# Patient Record
Sex: Male | Born: 1940 | Race: White | Hispanic: No | Smoking: Current every day smoker
Health system: Southern US, Community
[De-identification: ages and names within clinical notes are randomized; demographics above are authoritative.]

## PROBLEM LIST (undated history)

## (undated) DIAGNOSIS — F32A Depression, unspecified: Secondary | ICD-10-CM

## (undated) DIAGNOSIS — F4310 Post-traumatic stress disorder, unspecified: Secondary | ICD-10-CM

## (undated) DIAGNOSIS — F419 Anxiety disorder, unspecified: Secondary | ICD-10-CM

## (undated) DIAGNOSIS — F1020 Alcohol dependence, uncomplicated: Secondary | ICD-10-CM

## (undated) DIAGNOSIS — J9611 Chronic respiratory failure with hypoxia: Secondary | ICD-10-CM

## (undated) DIAGNOSIS — I219 Acute myocardial infarction, unspecified: Secondary | ICD-10-CM

## (undated) DIAGNOSIS — G8929 Other chronic pain: Secondary | ICD-10-CM

## (undated) DIAGNOSIS — J189 Pneumonia, unspecified organism: Secondary | ICD-10-CM

## (undated) DIAGNOSIS — M1990 Unspecified osteoarthritis, unspecified site: Secondary | ICD-10-CM

## (undated) DIAGNOSIS — I480 Paroxysmal atrial fibrillation: Secondary | ICD-10-CM

## (undated) DIAGNOSIS — G629 Polyneuropathy, unspecified: Secondary | ICD-10-CM

## (undated) DIAGNOSIS — K224 Dyskinesia of esophagus: Secondary | ICD-10-CM

## (undated) DIAGNOSIS — F329 Major depressive disorder, single episode, unspecified: Secondary | ICD-10-CM

## (undated) DIAGNOSIS — D649 Anemia, unspecified: Secondary | ICD-10-CM

## (undated) DIAGNOSIS — J449 Chronic obstructive pulmonary disease, unspecified: Secondary | ICD-10-CM

## (undated) DIAGNOSIS — E119 Type 2 diabetes mellitus without complications: Secondary | ICD-10-CM

## (undated) DIAGNOSIS — I10 Essential (primary) hypertension: Secondary | ICD-10-CM

## (undated) DIAGNOSIS — R600 Localized edema: Secondary | ICD-10-CM

## (undated) DIAGNOSIS — M549 Dorsalgia, unspecified: Secondary | ICD-10-CM

## (undated) DIAGNOSIS — K56600 Partial intestinal obstruction, unspecified as to cause: Secondary | ICD-10-CM

## (undated) HISTORY — DX: Other chronic pain: G89.29

## (undated) HISTORY — DX: Dorsalgia, unspecified: M54.9

## (undated) HISTORY — DX: Type 2 diabetes mellitus without complications: E11.9

## (undated) HISTORY — PX: OTHER SURGICAL HISTORY: SHX169

## (undated) HISTORY — DX: Acute myocardial infarction, unspecified: I21.9

## (undated) HISTORY — DX: Post-traumatic stress disorder, unspecified: F43.10

## (undated) HISTORY — DX: Chronic obstructive pulmonary disease, unspecified: J44.9

## (undated) HISTORY — PX: APPENDECTOMY: SHX54

## (undated) HISTORY — DX: Major depressive disorder, single episode, unspecified: F32.9

## (undated) HISTORY — DX: Polyneuropathy, unspecified: G62.9

## (undated) HISTORY — DX: Anemia, unspecified: D64.9

## (undated) HISTORY — PX: PROSTATE SURGERY: SHX751

## (undated) HISTORY — DX: Depression, unspecified: F32.A

## (undated) HISTORY — PX: HERNIA REPAIR: SHX51

## (undated) HISTORY — DX: Unspecified osteoarthritis, unspecified site: M19.90

---

## 2002-07-30 ENCOUNTER — Encounter: Payer: Self-pay | Admitting: *Deleted

## 2002-07-30 ENCOUNTER — Emergency Department (HOSPITAL_COMMUNITY): Admission: EM | Admit: 2002-07-30 | Discharge: 2002-07-30 | Payer: Self-pay | Admitting: *Deleted

## 2003-04-12 ENCOUNTER — Emergency Department (HOSPITAL_COMMUNITY): Admission: EM | Admit: 2003-04-12 | Discharge: 2003-04-12 | Payer: Self-pay | Admitting: Emergency Medicine

## 2003-08-27 ENCOUNTER — Inpatient Hospital Stay (HOSPITAL_COMMUNITY): Admission: AC | Admit: 2003-08-27 | Discharge: 2003-08-31 | Payer: Self-pay

## 2004-04-23 ENCOUNTER — Encounter: Admission: RE | Admit: 2004-04-23 | Discharge: 2004-04-23 | Payer: Self-pay | Admitting: *Deleted

## 2005-06-24 ENCOUNTER — Emergency Department (HOSPITAL_COMMUNITY): Admission: EM | Admit: 2005-06-24 | Discharge: 2005-06-24 | Payer: Self-pay | Admitting: Emergency Medicine

## 2005-10-13 ENCOUNTER — Inpatient Hospital Stay (HOSPITAL_COMMUNITY): Admission: EM | Admit: 2005-10-13 | Discharge: 2005-10-15 | Payer: Self-pay | Admitting: Emergency Medicine

## 2005-10-15 ENCOUNTER — Inpatient Hospital Stay (HOSPITAL_COMMUNITY): Admission: EM | Admit: 2005-10-15 | Discharge: 2005-10-17 | Payer: Self-pay | Admitting: Psychiatry

## 2005-10-16 ENCOUNTER — Ambulatory Visit: Payer: Self-pay | Admitting: Psychiatry

## 2005-11-24 ENCOUNTER — Inpatient Hospital Stay (HOSPITAL_COMMUNITY): Admission: RE | Admit: 2005-11-24 | Discharge: 2005-11-27 | Payer: Self-pay | Admitting: Psychiatry

## 2005-11-24 ENCOUNTER — Ambulatory Visit: Payer: Self-pay | Admitting: Psychiatry

## 2005-11-26 ENCOUNTER — Encounter (INDEPENDENT_AMBULATORY_CARE_PROVIDER_SITE_OTHER): Payer: Self-pay | Admitting: *Deleted

## 2006-04-08 ENCOUNTER — Emergency Department (HOSPITAL_COMMUNITY): Admission: EM | Admit: 2006-04-08 | Discharge: 2006-04-08 | Payer: Self-pay | Admitting: Diagnostic Radiology

## 2006-09-21 ENCOUNTER — Inpatient Hospital Stay (HOSPITAL_COMMUNITY): Admission: AD | Admit: 2006-09-21 | Discharge: 2006-09-30 | Payer: Self-pay | Admitting: *Deleted

## 2006-09-21 ENCOUNTER — Ambulatory Visit: Payer: Self-pay | Admitting: *Deleted

## 2007-03-05 ENCOUNTER — Ambulatory Visit: Payer: Self-pay | Admitting: Cardiology

## 2007-06-08 ENCOUNTER — Inpatient Hospital Stay (HOSPITAL_COMMUNITY): Admission: EM | Admit: 2007-06-08 | Discharge: 2007-06-09 | Payer: Self-pay | Admitting: Emergency Medicine

## 2007-06-09 ENCOUNTER — Encounter (INDEPENDENT_AMBULATORY_CARE_PROVIDER_SITE_OTHER): Payer: Self-pay | Admitting: Internal Medicine

## 2007-06-11 ENCOUNTER — Emergency Department: Payer: Self-pay | Admitting: Internal Medicine

## 2007-06-12 ENCOUNTER — Emergency Department (HOSPITAL_COMMUNITY): Admission: EM | Admit: 2007-06-12 | Discharge: 2007-06-12 | Payer: Self-pay | Admitting: Emergency Medicine

## 2007-11-15 ENCOUNTER — Emergency Department (HOSPITAL_COMMUNITY): Admission: EM | Admit: 2007-11-15 | Discharge: 2007-11-16 | Payer: Self-pay | Admitting: Emergency Medicine

## 2007-11-26 ENCOUNTER — Inpatient Hospital Stay (HOSPITAL_COMMUNITY): Admission: AD | Admit: 2007-11-26 | Discharge: 2007-12-04 | Payer: Self-pay | Admitting: *Deleted

## 2007-11-26 ENCOUNTER — Ambulatory Visit: Payer: Self-pay | Admitting: *Deleted

## 2007-11-28 ENCOUNTER — Encounter: Payer: Self-pay | Admitting: *Deleted

## 2007-12-21 ENCOUNTER — Encounter: Payer: Self-pay | Admitting: Emergency Medicine

## 2007-12-21 ENCOUNTER — Inpatient Hospital Stay (HOSPITAL_COMMUNITY): Admission: AD | Admit: 2007-12-21 | Discharge: 2007-12-25 | Payer: Self-pay | Admitting: *Deleted

## 2007-12-22 ENCOUNTER — Ambulatory Visit: Payer: Self-pay | Admitting: *Deleted

## 2007-12-26 ENCOUNTER — Inpatient Hospital Stay (HOSPITAL_COMMUNITY): Admission: EM | Admit: 2007-12-26 | Discharge: 2008-01-02 | Payer: Self-pay | Admitting: Emergency Medicine

## 2007-12-28 ENCOUNTER — Encounter (INDEPENDENT_AMBULATORY_CARE_PROVIDER_SITE_OTHER): Payer: Self-pay | Admitting: Internal Medicine

## 2008-01-02 ENCOUNTER — Inpatient Hospital Stay (HOSPITAL_COMMUNITY): Admission: AD | Admit: 2008-01-02 | Discharge: 2008-01-05 | Payer: Self-pay | Admitting: *Deleted

## 2008-02-21 ENCOUNTER — Emergency Department (HOSPITAL_COMMUNITY): Admission: EM | Admit: 2008-02-21 | Discharge: 2008-02-21 | Payer: Self-pay | Admitting: Emergency Medicine

## 2008-04-25 ENCOUNTER — Inpatient Hospital Stay (HOSPITAL_COMMUNITY): Admission: EM | Admit: 2008-04-25 | Discharge: 2008-04-27 | Payer: Self-pay | Admitting: Emergency Medicine

## 2008-04-27 ENCOUNTER — Inpatient Hospital Stay (HOSPITAL_COMMUNITY): Admission: AD | Admit: 2008-04-27 | Discharge: 2008-05-05 | Payer: Self-pay | Admitting: Psychiatry

## 2008-04-27 ENCOUNTER — Ambulatory Visit: Payer: Self-pay | Admitting: Psychiatry

## 2008-06-05 ENCOUNTER — Emergency Department (HOSPITAL_COMMUNITY): Admission: EM | Admit: 2008-06-05 | Discharge: 2008-06-05 | Payer: Self-pay | Admitting: Emergency Medicine

## 2008-06-18 ENCOUNTER — Inpatient Hospital Stay (HOSPITAL_COMMUNITY): Admission: EM | Admit: 2008-06-18 | Discharge: 2008-06-26 | Payer: Self-pay | Admitting: Emergency Medicine

## 2008-08-03 ENCOUNTER — Emergency Department (HOSPITAL_COMMUNITY): Admission: EM | Admit: 2008-08-03 | Discharge: 2008-08-03 | Payer: Self-pay | Admitting: Emergency Medicine

## 2008-09-25 ENCOUNTER — Emergency Department (HOSPITAL_COMMUNITY): Admission: EM | Admit: 2008-09-25 | Discharge: 2008-09-25 | Payer: Self-pay | Admitting: Emergency Medicine

## 2008-10-01 ENCOUNTER — Other Ambulatory Visit: Payer: Self-pay | Admitting: Emergency Medicine

## 2008-10-01 ENCOUNTER — Ambulatory Visit: Payer: Self-pay | Admitting: Psychiatry

## 2008-10-01 ENCOUNTER — Inpatient Hospital Stay (HOSPITAL_COMMUNITY): Admission: EM | Admit: 2008-10-01 | Discharge: 2008-10-06 | Payer: Self-pay | Admitting: Psychiatry

## 2008-10-21 ENCOUNTER — Emergency Department (HOSPITAL_COMMUNITY): Admission: EM | Admit: 2008-10-21 | Discharge: 2008-10-21 | Payer: Self-pay | Admitting: Emergency Medicine

## 2009-01-16 ENCOUNTER — Other Ambulatory Visit: Payer: Self-pay | Admitting: Emergency Medicine

## 2009-01-17 ENCOUNTER — Ambulatory Visit: Payer: Self-pay | Admitting: *Deleted

## 2009-01-17 ENCOUNTER — Inpatient Hospital Stay (HOSPITAL_COMMUNITY): Admission: AD | Admit: 2009-01-17 | Discharge: 2009-01-23 | Payer: Self-pay | Admitting: *Deleted

## 2009-03-30 ENCOUNTER — Inpatient Hospital Stay (HOSPITAL_COMMUNITY): Admission: EM | Admit: 2009-03-30 | Discharge: 2009-04-05 | Payer: Self-pay | Admitting: Emergency Medicine

## 2009-04-23 ENCOUNTER — Emergency Department (HOSPITAL_COMMUNITY): Admission: EM | Admit: 2009-04-23 | Discharge: 2009-04-23 | Payer: Self-pay | Admitting: Emergency Medicine

## 2010-11-08 LAB — GLUCOSE, CAPILLARY
Glucose-Capillary: 121 mg/dL — ABNORMAL HIGH (ref 70–99)
Glucose-Capillary: 129 mg/dL — ABNORMAL HIGH (ref 70–99)
Glucose-Capillary: 187 mg/dL — ABNORMAL HIGH (ref 70–99)
Glucose-Capillary: 96 mg/dL (ref 70–99)

## 2010-11-09 LAB — BASIC METABOLIC PANEL
BUN: 19 mg/dL (ref 6–23)
CO2: 26 mEq/L (ref 19–32)
Calcium: 8.5 mg/dL (ref 8.4–10.5)
Calcium: 8.8 mg/dL (ref 8.4–10.5)
Chloride: 105 mEq/L (ref 96–112)
GFR calc Af Amer: >60 mL/min (ref >60)
GFR calc Af Amer: >60 mL/min (ref >60)
GFR calc non Af Amer: >60 mL/min (ref >60)
Glucose, Bld: 122 mg/dL — ABNORMAL HIGH (ref 70–99)
Potassium: 3.7 mEq/L (ref 3.5–5.1)
Potassium: 3.9 mEq/L (ref 3.5–5.1)
Sodium: 137 mEq/L (ref 135–145)

## 2010-11-09 LAB — LIPID PANEL
HDL: 50 mg/dL (ref >39)
LDL Cholesterol: 37 mg/dL (ref 0–99)
Triglycerides: 93 mg/dL (ref <150)

## 2010-11-09 LAB — URINE MICROSCOPIC-ADD ON

## 2010-11-09 LAB — HEMOGLOBIN A1C: Hgb A1c MFr Bld: 5.2 % (ref 4.6–6.1)

## 2010-11-09 LAB — DIFFERENTIAL
Basophils Absolute: 0 10*3/uL (ref 0.0–0.1)
Basophils Relative: 0 % (ref 0–1)
Basophils Relative: 0 % (ref 0–1)
Basophils Relative: 0 % (ref 0–1)
Eosinophils Absolute: 0 10*3/uL (ref 0.0–0.7)
Eosinophils Absolute: 0.1 10*3/uL (ref 0.0–0.7)
Eosinophils Relative: 0 % (ref 0–5)
Eosinophils Relative: 1 % (ref 0–5)
Lymphocytes Relative: 7 % — ABNORMAL LOW (ref 12–46)
Lymphocytes Relative: 8 % — ABNORMAL LOW (ref 12–46)
Lymphs Abs: 0.6 10*3/uL — ABNORMAL LOW (ref 0.7–4.0)
Monocytes Absolute: 0.2 10*3/uL (ref 0.1–1.0)
Monocytes Absolute: 0.7 10*3/uL (ref 0.1–1.0)
Monocytes Relative: 2 % — ABNORMAL LOW (ref 3–12)
Monocytes Relative: 4 % (ref 3–12)
Monocytes Relative: 7 % (ref 3–12)
Neutro Abs: 7.2 10*3/uL (ref 1.7–7.7)
Neutro Abs: 7.7 10*3/uL (ref 1.7–7.7)
Neutrophils Relative %: 86 % — ABNORMAL HIGH (ref 43–77)

## 2010-11-09 LAB — GLUCOSE, CAPILLARY
Glucose-Capillary: 133 mg/dL — ABNORMAL HIGH (ref 70–99)
Glucose-Capillary: 145 mg/dL — ABNORMAL HIGH (ref 70–99)
Glucose-Capillary: 155 mg/dL — ABNORMAL HIGH (ref 70–99)
Glucose-Capillary: 161 mg/dL — ABNORMAL HIGH (ref 70–99)
Glucose-Capillary: 189 mg/dL — ABNORMAL HIGH (ref 70–99)
Glucose-Capillary: 206 mg/dL — ABNORMAL HIGH (ref 70–99)
Glucose-Capillary: 339 mg/dL — ABNORMAL HIGH (ref 70–99)

## 2010-11-09 LAB — URINALYSIS, ROUTINE W REFLEX MICROSCOPIC
Bilirubin Urine: NEGATIVE
Nitrite: NEGATIVE
Specific Gravity, Urine: 1.02 (ref 1.005–1.030)
pH: 5 (ref 5.0–8.0)

## 2010-11-09 LAB — PHOSPHORUS: Phosphorus: 3.4 mg/dL (ref 2.3–4.6)

## 2010-11-09 LAB — CBC
HCT: 28.8 % — ABNORMAL LOW (ref 39.0–52.0)
HCT: 30.3 % — ABNORMAL LOW (ref 39.0–52.0)
HCT: 33.2 % — ABNORMAL LOW (ref 39.0–52.0)
Hemoglobin: 10.5 g/dL — ABNORMAL LOW (ref 13.0–17.0)
Hemoglobin: 11.5 g/dL — ABNORMAL LOW (ref 13.0–17.0)
MCHC: 34.8 g/dL (ref 30.0–36.0)
MCHC: 35 g/dL (ref 30.0–36.0)
MCV: 95.7 fL (ref 78.0–100.0)
Platelets: 187 10*3/uL (ref 150–400)
Platelets: 204 10*3/uL (ref 150–400)
RBC: 3.01 MIL/uL — ABNORMAL LOW (ref 4.22–5.81)
RBC: 3.15 MIL/uL — ABNORMAL LOW (ref 4.22–5.81)
RDW: 15.2 % (ref 11.5–15.5)
RDW: 15.7 % — ABNORMAL HIGH (ref 11.5–15.5)
WBC: 6.3 10*3/uL (ref 4.0–10.5)

## 2010-11-09 LAB — TSH: TSH: 2.06 u[IU]/mL (ref 0.350–4.500)

## 2010-11-09 LAB — COMPREHENSIVE METABOLIC PANEL
ALT: 18 U/L (ref 0–53)
AST: 22 U/L (ref 0–37)
Albumin: 3.7 g/dL (ref 3.5–5.2)
Alkaline Phosphatase: 68 U/L (ref 39–117)
Glucose, Bld: 92 mg/dL (ref 70–99)
Potassium: 3.5 mEq/L (ref 3.5–5.1)
Sodium: 131 mEq/L — ABNORMAL LOW (ref 135–145)
Total Protein: 6.5 g/dL (ref 6.0–8.3)

## 2010-11-11 LAB — RAPID URINE DRUG SCREEN, HOSP PERFORMED
Benzodiazepines: POSITIVE — AB
Cocaine: NOT DETECTED
Opiates: NOT DETECTED
Tetrahydrocannabinol: NOT DETECTED

## 2010-11-11 LAB — GLUCOSE, CAPILLARY
Glucose-Capillary: 105 mg/dL — ABNORMAL HIGH (ref 70–99)
Glucose-Capillary: 109 mg/dL — ABNORMAL HIGH (ref 70–99)
Glucose-Capillary: 117 mg/dL — ABNORMAL HIGH (ref 70–99)
Glucose-Capillary: 119 mg/dL — ABNORMAL HIGH (ref 70–99)
Glucose-Capillary: 120 mg/dL — ABNORMAL HIGH (ref 70–99)
Glucose-Capillary: 124 mg/dL — ABNORMAL HIGH (ref 70–99)
Glucose-Capillary: 87 mg/dL (ref 70–99)
Glucose-Capillary: 92 mg/dL (ref 70–99)

## 2010-11-11 LAB — BASIC METABOLIC PANEL
BUN: 7 mg/dL (ref 6–23)
CO2: 26 mEq/L (ref 19–32)
Calcium: 9 mg/dL (ref 8.4–10.5)
GFR calc non Af Amer: >60 mL/min (ref >60)
Glucose, Bld: 100 mg/dL — ABNORMAL HIGH (ref 70–99)
Potassium: 3.7 mEq/L (ref 3.5–5.1)
Sodium: 137 mEq/L (ref 135–145)

## 2010-11-11 LAB — CBC
HCT: 33.8 % — ABNORMAL LOW (ref 39.0–52.0)
Hemoglobin: 12 g/dL — ABNORMAL LOW (ref 13.0–17.0)
MCHC: 35.5 g/dL (ref 30.0–36.0)
Platelets: 212 10*3/uL (ref 150–400)
RDW: 13.5 % (ref 11.5–15.5)

## 2010-11-11 LAB — DIFFERENTIAL
Basophils Absolute: 0.1 10*3/uL (ref 0.0–0.1)
Basophils Relative: 1 % (ref 0–1)
Eosinophils Relative: 2 % (ref 0–5)
Monocytes Absolute: 0.6 10*3/uL (ref 0.1–1.0)

## 2010-11-11 LAB — ACETAMINOPHEN LEVEL: Acetaminophen (Tylenol), Serum: 14.9 ug/mL (ref 10–30)

## 2010-11-14 LAB — GLUCOSE, CAPILLARY
Glucose-Capillary: 87 mg/dL (ref 70–99)
Glucose-Capillary: 100 mg/dL — ABNORMAL HIGH (ref 70–99)
Glucose-Capillary: 103 mg/dL — ABNORMAL HIGH (ref 70–99)
Glucose-Capillary: 132 mg/dL — ABNORMAL HIGH (ref 70–99)
Glucose-Capillary: 138 mg/dL — ABNORMAL HIGH (ref 70–99)
Glucose-Capillary: 88 mg/dL (ref 70–99)
Glucose-Capillary: 89 mg/dL (ref 70–99)
Glucose-Capillary: 91 mg/dL (ref 70–99)
Glucose-Capillary: 95 mg/dL (ref 70–99)

## 2010-11-19 LAB — URINALYSIS, ROUTINE W REFLEX MICROSCOPIC
Glucose, UA: 100 mg/dL — AB
Ketones, ur: NEGATIVE mg/dL
Leukocytes, UA: NEGATIVE
Protein, ur: NEGATIVE mg/dL
pH: 6 (ref 5.0–8.0)

## 2010-11-19 LAB — COMPREHENSIVE METABOLIC PANEL
ALT: 13 U/L (ref 0–53)
Alkaline Phosphatase: 70 U/L (ref 39–117)
BUN: 16 mg/dL (ref 6–23)
CO2: 26 mEq/L (ref 19–32)
Calcium: 8.7 mg/dL (ref 8.4–10.5)
GFR calc non Af Amer: >60 mL/min (ref >60)
Glucose, Bld: 288 mg/dL — ABNORMAL HIGH (ref 70–99)
Potassium: 4.4 mEq/L (ref 3.5–5.1)
Sodium: 138 mEq/L (ref 135–145)

## 2010-11-19 LAB — CBC
HCT: 33 % — ABNORMAL LOW (ref 39.0–52.0)
HCT: 33.1 % — ABNORMAL LOW (ref 39.0–52.0)
Hemoglobin: 11.5 g/dL — ABNORMAL LOW (ref 13.0–17.0)
Hemoglobin: 11.6 g/dL — ABNORMAL LOW (ref 13.0–17.0)
MCHC: 34.7 g/dL (ref 30.0–36.0)
MCV: 94.4 fL (ref 78.0–100.0)
RBC: 3.5 MIL/uL — ABNORMAL LOW (ref 4.22–5.81)
RBC: 3.52 MIL/uL — ABNORMAL LOW (ref 4.22–5.81)
WBC: 11.3 10*3/uL — ABNORMAL HIGH (ref 4.0–10.5)

## 2010-11-19 LAB — BASIC METABOLIC PANEL
Chloride: 108 mEq/L (ref 96–112)
Creatinine, Ser: 0.84 mg/dL (ref 0.4–1.5)
GFR calc Af Amer: >60 mL/min (ref >60)
Potassium: 3.4 mEq/L — ABNORMAL LOW (ref 3.5–5.1)
Sodium: 142 mEq/L (ref 135–145)

## 2010-11-19 LAB — DIFFERENTIAL
Basophils Relative: 0 % (ref 0–1)
Eosinophils Absolute: 0 10*3/uL (ref 0.0–0.7)
Eosinophils Absolute: 0.2 10*3/uL (ref 0.0–0.7)
Lymphs Abs: 2.1 10*3/uL (ref 0.7–4.0)
Monocytes Relative: 6 % (ref 3–12)
Neutro Abs: 11.5 10*3/uL — ABNORMAL HIGH (ref 1.7–7.7)
Neutrophils Relative %: 74 % (ref 43–77)
Neutrophils Relative %: 94 % — ABNORMAL HIGH (ref 43–77)

## 2010-11-19 LAB — GLUCOSE, CAPILLARY
Glucose-Capillary: 176 mg/dL — ABNORMAL HIGH (ref 70–99)
Glucose-Capillary: 208 mg/dL — ABNORMAL HIGH (ref 70–99)

## 2010-11-19 LAB — ETHANOL: Alcohol, Ethyl (B): <5 mg/dL (ref 0–10)

## 2010-12-17 NOTE — Discharge Summary (Signed)
NAMEDELONTE, MUSICH NO.:  [id number]   MEDICAL RECORD NO.:  [id number]          PATIENT TYPE:  IPS   LOCATION:  0503                          FACILITY:  [facility]   PHYSICIAN:  Geoffery Lyons, M.D.      DATE OF BIRTH:  1940-11-13   DATE OF ADMISSION:  04/27/2008  DATE OF DISCHARGE:  05/05/2008                               DISCHARGE SUMMARY   CHIEF COMPLAINT/PRESENT ILLNESS:  This was one of multiple admissions to  [facility] for this 70 year old male, voluntarily  admitted.  He has a history of depression and also alcohol dependence.  He had been admitted to the medical unit on April 25, 2008, with  acute exacerbation of his COPD.  He stressed to the medical staff that  he was having some thoughts of suicide.  Endorsed ongoing depression.  He had been compliant with his medication, was feeling sad with low  energy, fearing for his safety.  Concerned that he might overdose if he  returned home.  He did admit to drinking alcohol up to the week before  the admission.   PAST PSYCHIATRIC HISTORY:  One of several admissions to [facility].  Persistent history of depression and substance  abuse.   ALCOHOL AND DRUG HISTORY:  Persistent use of alcohol.   MEDICAL HISTORY:  1. COPD.  2. Hypertension.  3. Degenerative disk disease.  4. Coronary artery disease.   MEDICATIONS:  1. Albuterol nebulizer twice a day as needed.  2. Combivent inhaler twice a day.  3. Prednisone taper 40 mg twice a day for three, then 20 twice a day      for three, then 10 twice a day for three, then 10 daily for 3 more      days.  4. Doxycycline 100 mg twice a day.  5. Seroquel 100 mg at night.  6. Paxil 20 mg twice a day.   Physical exam failed to show any acute findings.   LABORATORY WORKUP:  Results not in the chart.   MENTAL STATUS EXAM:  Reveals an alert cooperative male.  Mood depressed.  Affect depressed, not as spontaneous.  Though processes  are logical,  coherent and relevant.  Admits to understanding that there was no one  else to be blamed for his use other than himself.  Endorsed he was  committed to abstain and get better.  Thought processes were logical,  coherent and relevant.  There were no active homicidal ideation.  Some  underlying suicidal ruminations, no plan.  No delusions.  Cognition well-  preserved.   ADMITTING DIAGNOSES:  AXIS I:  Major depressive disorder, posttraumatic  stress disorder by history, alcohol dependence.  AXIS II:  No diagnosis.  AXIS III:  Chronic obstructive pulmonary disease, hypertension, urinary  retention.  AXIS IV:  Moderate.  AXIS V:  Global Assessment of Functioning upon admission 35-40, highest  Global Assessment of Functioning in the last year 60.   COURSE IN THE HOSPITAL:  He was admitted.  He was started in individual  and group psychotherapy.  He was maintained on  his prednisone taper.  He  was initially on Seroquel 100, what seemed to be too much, so he was  dropped to 15.  He was active in the group therapy.  He was able to open  up and continued to work on himself.  There was an issue in terms of  placement.  He was living by himself and this probably was not the  better disposition, but he was wanting to work through the [place] and get a  place where according to him he would be around his own kind.  On  May 02, 2008, he was able to look at the reality of his situation.  Insightful that the isolation was not helping him to feel better, but he  was very concrete.  He kept back to be you are looking at who needs to  be blamed.  I can't blame anyone but myself.  he kept repeating the  same thing, which showed no clear rational plan of how he was going to  keep himself from going back to drinking.  There was some  lightheadedness, but once we dropped the Seroquel to 50 got better, but  he did say that he suffered from lightheadedness before and it was not   medication-induced.  On May 05, 2008, he was in full contact with  reality.  He felt that he was ready to go.  There were no active  suicidal or homicidal ideas, no hallucinations or delusions.  No  evidence of withdrawal.  Physically wise, he was better.  Willing to  pursue outpatient treatment.  We went ahead and discharged to outpatient  follow-up.   DISCHARGE DIAGNOSES:  AXIS I:  Major depressive disorder, posttraumatic  stress disorder by history, alcohol abuse and alcohol dependence.  AXIS II:  No diagnosis.  AXIS III:  Chronic obstructive pulmonary disease, hypertension, urinary  retention.  AXIS IV:  Moderate.  AXIS V:  Global Assessment of Functioning upon discharge 50-55.   Discharged on Combivent inhaler 1 puff twice a day, Paxil 20 mg twice a  day, Protonix 40 mg per day, prednisone 20 mg one twice a day for two  more days, then prednisone 10 mg one twice a day for two more days,  Seroquel 50 one or two at bedtime, trazodone 50 at bedtime for sleep,  Vicodin 5/325 one every 6 hours as needed for pain as per his outpatient  pain clinic.  Continue oxygen at 3 liters per minute.  Follow-up at  [facility] and [facility].      Geoffery Lyons, M.D.  Electronically Signed     IL/MEDQ  D:  05/26/2008  T:  05/26/2008  Job:  [id number]

## 2010-12-17 NOTE — H&P (Signed)
[name], [name]:  [id number]   MEDICAL RECORD NO.:  [id number]          PATIENT TYPE:  IPS   LOCATION:  0500                          FACILITY:  [facility]   PHYSICIAN:  Geoffery Lyons, M.D.      DATE OF BIRTH:  03-09-1941   DATE OF ADMISSION:  10/01/2008  DATE OF DISCHARGE:                       PSYCHIATRIC ADMISSION ASSESSMENT   DATE OF ASSESSMENT:  October 02, 2008 at 0925.   IDENTIFYING INFORMATION:  A 70 year old male.  This is a voluntary  admission.   HISTORY OF PRESENT ILLNESS:  This is one of several [facility] admissions for  this 70 year old who initially presented in the emergency room with some  suicidal thoughts that he might want to blow himself up with C4  explosives.  He reports an increased depression since his mother is  dying in a local nursing home.  He says that she has been much worse.  He denies relapsing on alcohol and has had several months of abstinence.  He stopped his Paxil about 1 month ago due to sexual side effects.  He  has not kept any follow-up appointments at [facility] as  scheduled.  He has also not had any Seroquel, which he takes for PTSD,  in more than a month.  He denies any homicidal or suicidal thoughts  today.   PAST PSYCHIATRIC HISTORY:  Previously scheduled to follow up at [facility].  Has not kept any appointments.  Has also been  followed at the [facility] in the past.  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  This is a fifth [facility] admission within  the past year, and he was last here April 27, 2008 to May 05, 2008.  He has a history of binge drinking and reports several months of  abstinence.   SOCIAL HISTORY:  Single male, lives alone.  Says that he has friends to  support him in getting groceries and help with other activities of daily  living.  He feels that he would be better  off in the nursing home part  of the [employer] system.  Endorsing vague stressors of  living alone and grieving the illness of his mother who has now been in  a nursing home for 11 years.  No known legal problems.   FAMILY HISTORY:  He denies family history of mental illness or substance  abuse.   MEDICAL HISTORY:  Primary care Danissa Rundle has been the [facility] hospital or the  local emergency room.   MEDICAL PROBLEMS:  1. COPD.  2. Hypertension.  3. Degenerative disk disease.   PAST MEDICAL HISTORY:  1. Four different spine surgeries in the past.  2. History of myocardial infarction.  3. Most recently treated for acute exacerbation of COPD in the      emergency room on August 25, 2008 when he was started on a      prednisone taper and antibiotics.  4. Newly diagnosed with diabetes mellitus type 2.  5.  History of community-acquired pneumonia in November 2009.   CURRENT MEDICATIONS:  1. Ativan 2 mg x1 given in the emergency room.  2. Metformin 500 mg p.o. b.i.d. started September 25, 2008.  3. Albuterol inhalers as needed for asthma and COPD.  Has taken      Combivent inhaler in the past.   DRUG ALLERGIES:  None.   PHYSICAL EXAMINATION:  Physical exam was done in the emergency room as  noted in the record.   REVIEW OF SYSTEMS:  Done in the emergency room and is generally  unremarkable.   LABORATORY DATA:  He has a urine drug screen was positive for  benzodiazepines.  CBC - WBC 11.3, hemoglobin 11.6, hematocrit 33.1 and  platelets 190,000.  MCV is 94.4.  Chemistry - sodium 142, potassium 3.4,  chloride 108, carbon dioxide 26, BUN 10, creatinine 0.84 and random  glucose 104.   MENTAL STATUS EXAM:  Fully alert male, pleasant cooperative, bright  affect, animated.  Has been up participating in all groups today.  Has  quite a sense of humor.  Mood neutral.  Speech normal and giving a  coherent history.  Thought process logical, coherent.  No signs of  internal  distraction.  No delusional statements made.  No evidence of  psychosis.  In full contact with reality.  No suicidal or homicidal  thoughts voiced.  Cognition fully preserved.   IMPRESSION:  AXIS I:.  Depressive disorder NOS.  Posttraumatic stress  disorder by history.  Alcohol abuse in partial remission.  AXIS II:  Deferred.  AXIS III:  Chronic obstructive pulmonary disease, hypertension and  diabetes mellitus type 2.  AXIS IV:  Moderate chronic stressors with social isolation and family  issues.  AXIS V:  Current 54; past year not known.   PLAN:  The plan is to voluntarily admit him to stabilize him.  Will  restart him on Seroquel 25 mg p.o. q.h.s., his routine inhalers, his  metformin.  Will check his blood sugars, and he is enrolled in our dual  diagnosis program.      Young Berry. Scott, N.P.      Geoffery Lyons, M.D.  Electronically Signed    MAS/MEDQ  D:  10/02/2008  T:  10/02/2008  Job:  [id number]

## 2010-12-17 NOTE — H&P (Signed)
[name], [name]               ACCOUNT NO.:  [id number]   MEDICAL RECORD NO.:  [id number]          PATIENT TYPE:  INP   LOCATION:  A329                          [facility]  [facility]   PHYSICIAN:  Skeet Latch, DO    DATE OF BIRTH:  1940-10-29   DATE OF ADMISSION:  06/18/2008  DATE OF DISCHARGE:  LH                              HISTORY & PHYSICAL   CHIEF COMPLAINT:  Shortness of breath.   HISTORY OF PRESENT ILLNESS:  This is an 70 year old Caucasian male who  presents with shortness of breath.  The patient states he has had  worsening shortness of breath with wheezing for approximately one week.  The patient also reported to have auditory hallucinations, some  depressive symptoms and suicidal ideation.  The patient stated to the  emergency room he did not care if he lived or died.  The patient admits  to his shortness of breath being acute in onset and worsening and  relieved by nothing, aggravated by nothing.  He denying chest pain,  fever, chills, but admits to slight cough.  The patient was seen in the  emergency room a few weeks prior with a diagnosis of acute COPD with  bronchitis and conjunctivitis.  The patient was given medications and  sent home.  The patient was at that time told to call the [place], make  appointment to be evaluated.  The patient does have an significant  history of COPD and tobacco abuse as well as post-traumatic stress  disorder.   PAST MEDICAL HISTORY:  1. Hypertension.  2. COPD.  3. Alcoholism.  4. Degenerative disk disease.  5. Depression.  6. Myocardial infarction.  7. Post traumatic stress disorder.   SURGICAL HISTORY:  1. Prostate surgery.  2. Three hernia surgeries.  3. Four spinal surgeries.  4. Appendectomy.   SOCIAL HISTORY:  Denies any drug use.  The patient is a former heavy  drinker and states that at this time he smokes three cigarettes a day.  He used to be also a heavy smoker.   HOME MEDICATIONS:  1. Seroquel 100 mg 1/2 tablet  at bedtime.  2. Paxil 20 mg twice a day.  3. Vicodin 5/500 as needed.  4. Trazodone 50 mg at bedtime.  5. Oxygen 3 liters per minute daily.  6. Xopenex 0.63 mg twice a day and as needed.  7. Combivent inhaler twice a day.   ALLERGIES:  NO KNOWN DRUG ALLERGIES   REVIEW OF SYSTEMS:  CONSTITUTIONAL:  No fever, chills, weight loss,  weight gain.  HEENT:  Unremarkable.  RESPIRATORY:  Positive for cough  and dyspnea.  GI: Slight abdominal pain.  No nausea, vomiting, diarrhea.  GENITOURINARY:  Has a history of some urinary retention.  MUSCULOSKELETAL:  Unremarkable.  SKIN:  Unremarkable.  NEUROLOGIC:  Unremarkable.  PSYCHIATRIC:  Positive for depression, suicidal  ideations.   PHYSICAL EXAMINATION:  VITAL SIGNS:  Temperature is 97.8, pulse 83,  respirations 20, sating 90% on 1 liter.  CONSTITUTION:  Well-developed, well-nourished, well-hydrated, no acute  distress.  HEENT:  Head is atraumatic, normocephalic.  Eyes: PERRL.  EOMI.  NECK:  Soft, supple, nontender, nondistended.  Oral mucosa is moist.  CARDIOVASCULAR:  Regular rate rhythm.  No murmurs, rubs, gallops.  LUNGS:  Decreased breath sounds.  He has some mild rhonchi and wheezing,  scattered bilaterally.  No rales.  ABDOMEN:  Soft, nondistended.  Positive bowel sounds.  NEUROLOGICAL:  Cranial nerves II-XII are grossly intact.  SKIN:  Good turgor, good texture.  PSYCHIATRIC:  Patient does have dementia-type symptoms with some  depression, but does not admit to me any suicidal ideations at this  time.   RADIOLOGIC STUDIES:  1. Chest x-ray showed left lower lobe density suspicious for      pneumonia.  2. Emphysema.   LABORATORY DATA:  Urine drug screen is unremarkable.  BNP is less than  30.  Alcohol level is less than 5.  Acetaminophen level is 22.5.  Sodium  137, potassium 3.2, chloride is 103, CO2 24, glucose 149, BUN 10,  creatinine 0.82, calcium 9.5.  White count 13.6, hemoglobin 12.4,  hematocrit 36.1, platelet count is  189.  ABG on 4 liters of oxygen  showed a pH 7.428, pCO2 37.5, pO2 170, bicarb 24.4.   ASSESSMENT:  1. Pneumonia.  2. Hypokalemia.  3. COPD.  4. Post traumatic stress disorder.   PLAN:  1. For his pneumonia, the patient will be placed on IV antibiotics and      will get a repeat x-ray tomorrow morning.  Will also get sputum      cultures and sensitivities as well as blood cultures x2.  2. Hypokalemia.  Will replace orally at this time and also repeat the      morning.  3. COPD.  The patient will be on breathing treatments with albuterol      and Atrovent, and the patient will be on IV Solu-Medrol at this      time.  4. Post traumatic stress disorder and suicidal ideation.  The patient      placed on suicide precautions.  Will get a Behavioral Health      consult as well as a social service consult at this time.  The      patient does have a significant      psychiatric history and has been in [facility] multiple      times in the recent past also.  5. The patient will be on DVT as well as GI prophylaxis.  We will      await Behavioral Health consultation.  The patient needs to have      inpatient treatment at this time.      Skeet Latch, DO  Electronically Signed     SM/MEDQ  D:  06/19/2008  T:  06/19/2008  Job:  [id number]

## 2010-12-17 NOTE — H&P (Signed)
NAMEJOURDEN, GILSON NO.:  [id number]   MEDICAL RECORD NO.:  [id number]           PATIENT TYPE:   LOCATION:                                FACILITY:  [facility]   PHYSICIAN:  Anselm Jungling, MD  DATE OF BIRTH:  1941/06/26   DATE OF ADMISSION:  11/26/2007  DATE OF DISCHARGE:                       PSYCHIATRIC ADMISSION ASSESSMENT   This is a voluntary admission to the services of Dr. Anselm Jungling.   IDENTIFYING INFORMATION:  This is a 70 year old divorced white male who  presented to the emergency department at [facility] yesterday  complaining of shortness of breath.  He reported that he had recently  been released from [facility] on Monday.  He stated he  had been treated there for depression.  He also states that he goes to  the [facility] for treatment but he has missed several appointments  because he did not have any way to get there.  In reviewing his intake  assessment apparently he was hospitalized in February and he was re-  hospitalized in [place] beginning April 13.  At that time he had  presented with complaints of depression in the company of a nephew.  He  was released from [facility] this past Monday.  He did receive  a phone call from a Mr. Sharen Counter in light of an inquiry that  [place] sent to the [place].  The patient reported that he was still  depressed.  He was encouraged to come into the [place] for evaluation and  treatment.  However, as he has no way to get there the patient did not  appear.  Somehow the police were called and he was brought to [facility] [place] [facility]  complaining of shortness of breath.   PAST PSYCHIATRIC HISTORY:  He reports having had outpatient care at the  [facility] and the 2 recent hospitalizations at [facility].  He is not a very  good historian and we have not been able to verify all of his  information yet.   SOCIAL HISTORY:  He states he went to the 11th grade.  He has been  married and  divorced once.  He has 2 sons, ages 84 and 41.  He reports  he gets income from [employer].   FAMILY HISTORY:  He states his father had depression.  His father did  have alcohol issues, however, he was sober for 22 years before dying of  Alzheimer's.  He states his mother is currently in a nursing home.  His  own history for alcohol and drugs, he states that he is a binge  alcoholic and his last issue with alcohol was about a month ago.   PRIMARY CARE PHYSICIAN:  He denies having one.   PSYCHIATRIST:  Again, he is not active with the [place].  He has not been  active with them for about 2 years now.   MEDICAL PROBLEMS:  He has COPD, hypertension, degenerative disk disease.   MENTAL STATUS EXAM:  He is alert and oriented.  He appears to be acutely  ill.  He is having a hard  time catching his breath and he coughs quite  frequently.  His speech is limited at this point in time due to these  problems.  He does actively have pneumonia.  His mood is depressed.  His  affect is congruent.  Thought processes are somewhat clear, rational and  goal oriented.  He wants to be taken care of.  Judgment and insight are  fair.  Concentration and memory are fair.  Intelligence is average.  He  denies being actively suicidal or homicidal.  He denies auditory visual  hallucinations.   DIAGNOSES:  AXIS I.  PTSD (post-traumatic stress disorder) by his  history, major depressive disorder, recurrent.  AXIS II.  Deferred.  AXIS III.  He does currently have pneumonia.  His chest x-ray shows that  he has an abnormal density in the right posterior medial lung base and  these findings are consistent with acute pneumonia.  He was also noted  to have COPD requiring oxygen, hypertension and degenerative disk  disease.  AXIS IV.  Problems with primary support group, economic issues.  AXIS V.  35.   PLAN:  To admit for safety and stabilization.  We will address his  psychiatric needs as indicated and continue treatment for  his pneumonia  with the doxycycline that was started in the ED.  He will need some  discharge planning.  He is not service connected per se with the [place].  He  may not be able to live alone at this point in time.  There is a [facility] on the campus at [facility] and he will  qualify for that but he would have to pay a fee of some sort.  Otherwise  the case manager needs to have a family session and help work out his  placement post discharge.      Mickie Leonarda Salon, P.A.-C.      Anselm Jungling, MD  Electronically Signed    MD/MEDQ  D:  11/27/2007  T:  11/27/2007  Job:  [id number]

## 2010-12-17 NOTE — Discharge Summary (Signed)
NAMEJUNIEL, [name]:  [id number]   MEDICAL RECORD NO.:  [id number]          PATIENT TYPE:  IPS   LOCATION:  0302                          FACILITY:  [facility]   PHYSICIAN:  Jasmine Pang, M.D. DATE OF BIRTH:  04-05-1941   DATE OF ADMISSION:  01/17/2009  DATE OF DISCHARGE:  01/23/2009                               DISCHARGE SUMMARY   IDENTIFICATION:  This is a 70 year old single white male from [facility] who was admitted on a voluntary basis.   HISTORY OF PRESENT ILLNESS:  The patient was depressed and feeling  suicidal with a plan to overdose on his medications.  He also started  drinking again.  He states he has been very depressed due to medical  problems.  He was just diagnosed with diabetes mellitus type 2.  He has  been using very little (approximately a six-pack).  He hears mumbling  voices in the background.  He goes to [place] for psychiatry treatment,  but has not been compliant recently.  He says he has fleeting suicidal  thoughts.  For further admission information see psychiatric and  admission assessment.  Initially, the patient was given an axis I  diagnosis of major depressive disorder recurrent, severe, and he is also  given a diagnosis of PTSD chronic (from his service in the [place] War).  He was also given the diagnosis of alcohol abuse.  On axis III he was  diagnosed with COPD, hypertension, and diabetes.   PHYSICAL FINDINGS:  There were no acute physical or medical problems  noted.  His physical exam was done at [facility] and was reviewed by our  nurse practitioner.  He was 3 L of O2 continuously.  Diagnostic studies,  hemoglobin was 12.  Hematocrit was 33.8.  Alcohol level was 78, glucose  was 100.  UDS was positive for benzodiazepines.   HOSPITAL COURSE:  Upon admission, the patient was started on his home  medications of Seroquel 100 mg p.o. q.h.s., Celexa 40 mg daily, O2 3 L  to inhale continuously, Combivent inhaler 2  puffs b.i.d.  He was also  placed on Librium 10 mg p.o. q.6 h. p.r.n. symptoms of withdrawal, and  an albuterol inhaler 2 puffs q.i.d. p.r.n., pravastatin 40 mg p.o. q.d.,  B12 500 mg p.o. b.i.d., vitamin D 50,000 units 1 tablet weekly,  metformin 500 mg b.i.d., and Percocet 5/325 mg 1 p.o. q.i.d. p.r.n.  pain.  In individual sessions, the patient was initially disheveled with  fair eye contact.  Speech was soft and slow.  There was psychomotor  retardation.  Mood was depressed and anxious.  Affect consistent with  mood.  He had no symptoms of thought disorder or psychosis. The  patient's sleep continued to be poor with depressed anxious mood as  hospitalization progressed.  He discussed PTSD symptoms from the War in  [place].  He states that he has bad flashbacks and dreams at time about  his deployment there, and he was started on Seroquel 50 mg p.o. q.h.s.  for both anxiety, and to help with sleep.  As hospitalization  progressed, the patient became the patient continued to be depressed and  anxious.  He discussed wanting to make amends with his son. He states  has been conflict fair.  He wants to see his grandchildren.  He has not  been able to see them recently.  His sleep had improved.  Appetite was  good.  He did have problems with the a.m. sedation after the Seroquel  was begun, so it was decreased to 25 mg p.o. q. h .s.  On January 22, 2009,  he talked about being disgusted about his medical problems.  He wants  to go the [place] after discharge for further treatment of his physical  problems.  He continued to discuss his PTSD symptoms from the [place]  War.  He was having no further side effects to the medication.  On January 23, 2009, mental status had improved markedly from admission status.  The patient was neatly dressed with good eye contact.  Speech was normal  rate and flow.  Psychomotor activity was within normal limits.  Mood was  less depressed, less anxious.  Affect was  consistent with mood.  There  was no suicidal or homicidal ideation.  No thoughts of self-injurious  behavior.  No auditory or visual hallucinations.  No paranoia or  delusions.  Thoughts were logical and goal-directed.  Thought content.  No predominant theme.  The patient wanted to go home today and was felt  to be safe for discharge.   DISCHARGE DIAGNOSES:  Axis I: Major depressive disorder recurrent,  severe without psychosis, posttraumatic stress disorder chronic, alcohol  abuse.  Axis II: None.  Axis III:  Chronic obstructive pulmonary disease, hypertension, and  diabetes.  Axis IV: Severe (problems with primary support group, other psychosocial  problems, medical problems, burden of psychiatric illness, complications  from alcohol abuse).   DISCHARGE PLANS:  There was no specific activity level or dietary  restriction.   POSTHOSPITAL CARE PLANS:  The patient will go to the [place] in [place] on  January 24, 2009, at 9 a.m.   DISCHARGE MEDICATIONS:  1. Combivent inhaler 2 puffs twice daily.  2. Vitamin D 50,000 units 1 pill every 7 days, last dose January 20, 2009.  3. Glucophage 500 mg twice daily.  4. Zocor 20 mg daily.  5. Vitamin B12 500 mcg daily.  6. Celexa 40 mg daily.  7. Seroquel 25 mg a bedtime.  8. Atrovent inhaler as prescribed by his primary care doctor.  9. Albuterol inhaler as described by his primary care doctor.  10.O2 3 L to inhale continuously.      Jasmine Pang, M.D.  Electronically Signed     BHS/MEDQ  D:  01/23/2009  T:  01/24/2009  Job:  [id number]

## 2010-12-17 NOTE — Discharge Summary (Signed)
NAMEOBALOLUWA, DELATTE               ACCOUNT NO.:  [id number]   MEDICAL RECORD NO.:  [id number]          PATIENT TYPE:  INP   LOCATION:  A308                          [facility]ILITY:  [facility]   PHYSICIAN:  Lonia Blood, M.D.      DATE OF BIRTH:  [birth year]   DATE OF ADMISSION:  06/18/2008  DATE OF DISCHARGE:  11/19/2009LH                               DISCHARGE SUMMARY   ADDENDUM TO DISCHARGE SUMMARY:  Please refer to discharge summary  dictated earlier.   PRIMARY CARE PHYSICIAN:  Unassigned.   HISTORY OF PRESENT ILLNESS:  The patient was initially scheduled to go  to inpatient psych.  However, he seems rather active.  He is no longer  suicidal.  For that reason, the patient is being transferred to assisted  living facility.  With this in mind, the patient will now not be on any  suicide precaution, and we will discharge the sitter.  He is stable  enough to go to a skilled facility as it is.      Lonia Blood, M.D.  Electronically Signed     LG/MEDQ  D:  06/22/2008  T:  06/22/2008  Job:  [id number]

## 2010-12-17 NOTE — H&P (Signed)
[name], [name]               ACCOUNT NO.:  [id number]   MEDICAL RECORD NO.:  [id number]          PATIENT TYPE:  INP   LOCATION:  A321                          [facility]  [facility]   PHYSICIAN:  Dorris Singh, DO    DATE OF BIRTH:  [birth year]   DATE OF ADMISSION:  04/25/2008  DATE OF DISCHARGE:  LH                              HISTORY & PHYSICAL   CHIEF COMPLAINT:  Dyspnea.  The patient is a 70 year old Caucasian male  who presented to [facility] Emergency Room complaining of shortness of  breath.  Stated that it started several days ago and was getting  progressively worse.  He has a known history of COPD.  He tried  breathing treatments, but did not make it any better.  During this time  he started to feel unstable and felt like he has suicidal ideations.  The patient has been hospitalized several times before at [facility] for this, and he has mentioned to the emergency room staff and to  the doctor that he is afraid he may commit suicide.   PAST MEDICAL HISTORY:  1. COPD.  2. Hypertension.  3. Alcoholism.  4. Degenerative disk disease.  5. Depression.  6. Myocardial infarction.  7. Post traumatic stress disorder.   SURGICAL HISTORY:  1. Appendectomy.  2. He has had prostate surgery, three hernia surgeries and four spinal      surgeries.   SOCIAL HISTORY:  Denies any drug abuse.  He used to be a former smoker  and a former drinker.   Currently he is on:  1. Seroquel 100 mg at bedtime.  2. Xopenex 0.63 b.i.d. and p.r.n.  3. Oxygen, nasal cannula at 3 liters.  4. Paxil 20 mg twice daily.  5. Combivent twice daily.  6. Doxycycline 100 mg twice daily.  7. Sterapred 10 mg twice daily.   ALLERGIES:  No known drug allergies.   REVIEW OF SYSTEMS:  CONSTITUTIONAL:  Negative.  Eyes are positive with  blurred vision or with cloudy vision.  NOSE, MOUTH:  Negative.  CARDIOVASCULAR:  Negative.  RESPIRATORY:  Positive for dyspnea.  BREASTS:  Negative.  GI: Negative.   GU: Positive for dribbling and has  been seeing urinary retention.  MUSCULOSKELETAL:  Negative.  SKIN:  Negative.  NEURO:  Negative.  PSYCHIATRIC:  Positive for depression and  suicidal ideation.   His blood pressure is 155/75, pulse rate 88, respirations 20,  temperature 98.2.  GENERAL:  The patient is a 70 year old Caucasian male who is well-  developed, well-nourished, who looks older than stated age.  HEAD:  Normocephalic, atraumatic.  EYES:  PERRLA.  EOMI with cloudy corneas.  Conjunctive are normal.  EARS: TMI, visualized bilaterally.  NOSE: Turbinates are moist.  THROAT: Positive upper and lower dentures.  NECK:  Supple.  No thyromegaly or lymphadenopathy.  HEART:  Regular rate and rhythm.  No rubs, gallops or murmurs.  LUNGS: Decreased breath sounds with mild wheezing.  Chest is moving  symmetrical.  ABDOMEN:  Soft, nontender, nondistended.  Bowel sounds in all four  quadrants with positive healing scars.  NEURO:  Cranial nerves II-XII are grossly intact.  EXTREMITIES:  No edema, ecchymosis or cyanosis.  Full range of motion.  SKIN:  Good turgor, good texture.  PSYCHIATRIC:  Depressed and suicidal.   Chest x-ray:  Bibasilar atelectasis, bronchitic changes are chronic.  EKG:  Poor R wave progression, nonspecific ST-T wave changes, normal  sinus rhythm.  Urine is negative.  His urine drug screen is negative.  Sodium is 138, potassium 3.2, chloride 102, carbon dioxide 27, glucose  116, BUN 13, creatinine 0.71.  White count 13.0, hemoglobin 13.5,  hematocrit 38.5.  Platelet count 240.  Alcohol level less than 5.   ASSESSMENT/PLAN:  1. Exacerbation of chronic obstructive pulmonary disease.  2. Suicide ideation.  We will admit patient to the service of      [facility].  We will provide a sitter for him due to suicidal      ideation.  We will try to keep his saturations above 90%.  We will      Hep-Lock him.  We will check his urine drug screen and alcohol      level.  We will  put him on his home medications.  We will do deep      vein thrombosis and gastrointestinal prophylaxis.  We will place      him on Solu-Medrol and Tessalon Perles, and we will continue to      monitor him. We  will get the ACT team to come see him.  Once he is      stable, we will also send him to [facility].  3. Urinary retention.  We will have Urology come see him to make any      further recommendations prior to discharge to [facility].      Dorris Singh, DO  Electronically Signed     CB/MEDQ  D:  04/25/2008  T:  04/25/2008  Job:  [id number]

## 2010-12-17 NOTE — H&P (Signed)
[name], [name]               ACCOUNT NO.:  [id number]   MEDICAL RECORD NO.:  [id number]          PATIENT TYPE:  INP   LOCATION:  IC03                          [facility]ACILITY:  [facility]   PHYSICIAN:  Marcello Moores, MD   DATE OF BIRTH:  1941/02/15   DATE OF ADMISSION:  06/08/2007  DATE OF DISCHARGE:  LH                              HISTORY & PHYSICAL   PMD:  Unassigned.  Usually stated goes to [facility] [place] [place]  [place].   CHIEF COMPLAINT:  Shortness of breath 2 days and alcohol intake.   HISTORY OF PRESENT ILLNESS:  [name] is a 70 year old man with a  history of COPD and multiple psychiatric problems, depression, anxiety  and post-traumatic stress disorder, and polysubstance abuse including  alcohol and tobacco, who presented with shortness of breath.  He  admitted also he was taking alcohol.  He was not able to specify what  type of alcohol.  The patient is a very poor historian and he was sleepy  at that time.  The patient denied by nodding any abdominal complaints  and no urinary complaints, and he has not any headache, no chest pain  other than his shortness of breath.  The patient admits that he has not  any primary doctor and he is not taking medication regularly, but the  patient was admitted repeatedly for psychiatric issues and he is on  multiple psychiatric medications as per the discharge summary of 2007  from psychiatric floor.  Otherwise, it was very difficult to extract any  reasonable history currently from this patient, but his alcohol level in  the emergency room was 325 and the patient looks drowsy, and he was put  already on Ativan protocol for withdrawal possibilities.   REVIEW OF SYSTEMS:  As mentioned in the HPI.  The patient is a very poor  historian and cannot be extracted a reasonable history from his systems.   ALLERGIES:  No known drug allergy.   SOCIAL HISTORY:  It was stated that he is divorced and lives with his  son as per the past  history.  He admitted he smokes 1-2 packs per day  and he drinks alcohol very frequently but denied any drug abuse.   FAMILY HISTORY:  Noncontributory for this admission.   PAST MEDICAL HISTORY:  It was recorded that he had one:   1. History of COPD.  2. Gastroesophageal reflux disease.  3. Chronic back pain.  4. Anxiety and depression.  5. Post-traumatic stress disorder.  6. Polysubstance abuse, alcohol plus tobacco use.  7. History of seizure related to alcohol withdrawal.   HOME MEDICATIONS:  It was listed as per the discharge in 2007 from the  psychiatric floor:   1. Seroquel 25 mg at bedtime.  2. Abilify 15 mg at bedtime.  3. Wellbutrin 100 mg p.o. daily.  4. Neurontin 600 mg t.i.d.  5. Ativan 0.5 mg three times a day.  6. Remeron 15 mg at bedtime.  7. Albuterol inhaler p.r.n.  8. Plendil 5 mg p.o. daily.  9. Reglan 10 grams t.i.d.  10.Protonix 40 mg b.i.d.  But I am not sure whether he is really taking these medications  currently.   PHYSICAL EXAMINATION:  The patient is lying in the bed with mild  respiratory distress, taking currently his respiratory treatments.  VITAL SIGNS:  Temperature 97.8, pulse is 92, respiratory rate 18 and  blood pressure is 153/49 and saturation is 100%.  HEENT:  Pink conjunctivae.  NECK:  Supple.  CHEST:  He has good air entry bilateral but he has rhonchi and very  scattered scanty wheezes.  CVS:  S1-S2 regular.  ABDOMEN:  Soft, no area of tenderness.  Normoactive bowel sounds.  EXTREMITIES:  No pedal or pretibial edema.  CNS:  He is alert but difficult to assess his orientation as the patient  is sleepy, but there is no neurological deficit.   On his labs which were done in the emergency room, white blood cells is  10 and hemoglobin is 12.8, hematocrit is 38 and platelet count is 294.  On the chemistry, sodium is 147, potassium is 3.3, chloride is 115 and  bicarb is 25, glucose is 104 and BUN 6, creatinine is 0.6.  Alcohol  level  is 324.   ASSESSMENT:  1. Chronic obstructive pulmonary disease exacerbation.  The patient      will be admitted and put on respiratory treatment with one every 4      hours and we will put him on Solu-Medrol IV and will send ABG and      will do chest x-ray as well, and will put him on oxygen 2-4 L to      keep saturation above 90.  2. Alcohol intoxication.  The patient is already put on Ativan      protocol, and we will continue with that.  3. Multiple psychiatric issues.  As mentioned in the past medical      history, the ACT team will come tomorrow to evaluate him for      possible psychiatric admission and evaluation.      Marcello Moores, MD  Electronically Signed     MT/MEDQ  D:  06/08/2007  T:  06/09/2007  Job:  [id number]

## 2010-12-17 NOTE — H&P (Signed)
[name], [name]               ACCOUNT NO.:  [id number]   MEDICAL RECORD NO.:  [id number]          PATIENT TYPE:  INP   LOCATION:  A314                          FACILITY:  [facility]   PHYSICIAN:  Skeet Latch, DO    DATE OF BIRTH:  23-Dec-1940   DATE OF ADMISSION:  03/30/2009  DATE OF DISCHARGE:  LH                              HISTORY & PHYSICAL   CHIEF COMPLAINT:  Shortness of breath and weakness.   HISTORY OF PRESENT ILLNESS:  This is a 70 year old Caucasian male, who  presents complaining of shortness of breath and weakness.  The patient  has a longstanding history of chronic obstructive pulmonary disease and  states that he has been short of breath for the last few days having  difficulty standing.  The patient states he has had a chronic back pain.  He has had multiple surgeries to his neck area and his lower back states  for the past few months, he has had trouble with multiple falls and  problems walking.  The patient states that his COPD is chronic in  nature.  He has had multiple nebulized treatments and states it has not  helped in last few days.  The patient also is complaining of some  weakness, denies any chest pain, abdominal pain, or any genitourinary at  this time.   PAST MEDICAL HISTORY:  Includes alcoholism, COPD, hypertension,  degenerative disc disease, depression, myocardial infarction,  posttraumatic stress disorder.   SURGICAL HISTORY:  Three hernia surgeries, 4 spinal surgeries,  appendectomy, prostate surgery.   SOCIAL HISTORY:  No history of drug use.  Former heavy drinker states  that he smokes less than a pack per day.   HOME MEDICATIONS:  1. Pravastatin 40 mg at bedtime.  2. Proventil inhaler as needed.  3. Vitamin B12 tablets twice a day.  4. Combivent inhaler as needed.  5. Ben inhaler as needed.  6. Nicotine 14 mg patch daily.  7. Oxycodone 5 mg every 4 hours as needed.   ALLERGIES:  No known drug allergies.   REVIEW OF SYSTEMS:   CONSTITUTIONAL:  Fevers, chills, weight loss, weight  gain.  HEENT:  Unremarkable.  RESPIRATORY:  Positive for shortness of  breath.  GI:  No abdominal pain, nausea, vomiting, diarrhea.  GENITOURINARY:  No urinary complaints.  MUSCULOSKELETAL:  Positive for  some neck and back pain.  SKIN:  Unremarkable.  NEUROLOGIC:  Positive  for some dizziness and weakness and falls.  PSYCHIATRIC:  No acute  symptoms.   PHYSICAL EXAMINATION:  VITAL SIGNS:  Temperature is 97.1, pulse 70,  respirations 24, blood pressure 161/70, sating 98% on 2 L.  GENERAL:  He is well-nourished, well-hydrated, in no acute distress.  HEENT:  Head is atraumatic, normocephalic.  Eyes, PERRLA.  EOMI.  NECK:  Soft, supple, nontender, nondistended.  Oral mucosa is moist.  CARDIOVASCULAR:  Regular rate and rhythm.  No murmurs, rubs, or gallops.  LUNGS:  Decreased breath sounds and some rhonchi.  Slight wheezing  bilaterally.  No rales.  ABDOMEN:  Soft, nontender, nondistended.  Positive bowel sounds.  No  rigidity  or guarding.  NEUROLOGIC:  Cranial nerves II through XII are grossly intact.  The  patient is alert and oriented x3.  SKIN:  Good turgor, good texture.   LABORATORY DATA:  Urinalysis trace blood and no nitrites or leukocytes.  Sodium 131, potassium 3.5, chloride 98, CO2 28, glucose 92, BUN 18,  creatinine 1.33, AST is 22, ALT is 18.  White count 7.3, hemoglobin  11.5, hematocrit 32.5, platelet count 240,000.  Chest x-ray showed  stable mild changes with COPD.  No acute abnormality.   ASSESSMENT:  1. Acute exacerbation of chronic obstructive pulmonary disease.  2. Hyponatremia.  3. History of posttraumatic stress disorder.  4. History of degenerative joint disease.   PLAN:  1. The patient is admitted to service of [facility] hospital[facility].  2. COPD.  The patient will be continued on oxygen and nebulizer      treatment, and we will start the patient on steroids at this time.      The patient's COPD is chronic in  nature.  I believe the patient was      prior at his baseline.  At this time, may require an outpatient      followup with a pulmonologist.  3. Hyponatremia.  The patient was started on normal saline.  Continue      to monitor his sodium levels at this time.  4. The patient states that he was diagnosed with diabetes.  I will add      a hemoglobin A1c to his labs.  The patient states that he was on      what he believes is metformin not elicited his medications and his      blood sugars seem to be controlled.  We will check an A1c and place      the patient on sliding scale at this time.  5. An x-ray of his degenerative joint disease.  The patient is      complaining of some neck pain with some radiated radiculopathy type      symptoms.  The patient may need follow up with possible      neurosurgeon after as an outpatient if this symptoms continue.  6. Generalized weakness with instability and falls.  I will get a PT,      OT consult.  The patient probably will carry out rehab therapy      after discharge.  Lastly, the patient will be on DVT as well as GI      prophylaxis.      Skeet Latch, DO     SM/MEDQ  D:  03/31/2009  T:  03/31/2009  Job:  [id number]
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
## 2010-12-17 NOTE — Consult Note (Signed)
NAMEBONNY, EGGER NO.:  [id number]   MEDICAL RECORD NO.:  [id number]          PATIENT TYPE:  INP   LOCATION:  A321                          FACILITY:  [facility]   PHYSICIAN:  Dennie Maizes, M.D.   DATE OF BIRTH:  September 18, 1940   DATE OF CONSULTATION:  04/26/2008  DATE OF DISCHARGE:                                 CONSULTATION   ATTENDING PHYSICIAN:  Hospitalist.   CONSULTING PHYSICIAN:  Dennie Maizes, MD, the urologist.   REASON FOR CONSULTATION:  Urinary retention.   CONSULTATION REPORT:  This 70 year old male came to the emergency room  at [facility] with a history of shortness of breath which was  progressively increasing in severity.  He was diagnosed with acute  exacerbation of COPD.  He also had suicidal ideation.  He has been  admitted to the hospital with voiding difficulty.  A Foley catheter has  been inserted.  Amount of postvoid residual urine is not noted.  Urethral Foley catheter has been removed and the patient has voided once  after the removal of the catheter.  I was asked to see the patient for  further evaluation.   The patient complains of voiding difficulty, urinary frequency x5-6, and  nocturia x4-5.  He has to strain to empty the bladder.   PAST HISTORY:  Significant for undergoing transurethral resection of  prostate surgery x2 about 4 years ago at [facility] and 2 years ago in the [place]  hospital in [place].  The patient denied having any fever, chills, or  hematuria at present.   PAST MEDICAL HISTORY:  1. History of COPD.  2. Hypertension.  3. Alcoholism.  4. Degenerative disk disease.  5. Depression.  6. Suicidal ideations.  7. Myocardial infarction.  8. Post-traumatic stress disorder.  9. Status post appendectomy.  10.Status post TURP x2.  11.Status post hernia surgery x3.  12.Status post spinal surgery x4.   MEDICATIONS:  1. Seroquel 100 mg at bedtime.  2. Xopenex 0.63 b.i.d. and p.r.n.  3. Oxygen by nasal cannula 3  L.  4. Paxil 20 mg twice a day.  5. Combivent inhaler twice daily.  6. Doxycycline 100 twice daily.  7. Sterapred  10 mg twice daily.   ALLERGIES:  None.   PHYSICAL EXAMINATION:  ABDOMEN:  Soft.  BACK:  No palpable flank mass or CVA tenderness.  GU:  Bladder is not palpable.  Penis and testes are normal.  RECTAL:  40 g sized benign prostate.   LABORATORY STUDIES:  Urinalysis clear.  BUN 16 and creatinine 0.76.  CBC; WBC 7.0, hemoglobin 11.8, and hematocrit 33.9   IMPRESSION:  Urinary retention (resolved), voiding difficulty.   PLAN:  The patient has had transurethral resection of prostate x2, 4 and  2 years ago.  He may have postoperative bladder neck stenosis causing  voiding difficulty.  This needs further evaluation.  Discussed about  this with the patient.  He would like to go to the [place] [facility] for further  evaluation.  He will return to our office on a p.r.n. basis if  necessary.  Dennie Maizes, M.D.  Electronically Signed     SK/MEDQ  D:  04/26/2008  T:  04/27/2008  Job:  [id number]   cc:   Hospitalist

## 2010-12-17 NOTE — Group Therapy Note (Signed)
[name], [name]               ACCOUNT NO.:  [id number]   MEDICAL RECORD NO.:  [id number]          PATIENT TYPE:  INP   LOCATION:  A329                          [facility]ACILITY[facility]  [facility]   PHYSICIAN:  Dorris Singh, DO    DATE OF BIRTH:  [birth year]   DATE OF PROCEDURE:  DATE OF DISCHARGE:                                 PROGRESS NOTE   HISTORY:  The patient is seen today resting comfortably.  He has no  complaints.  Still does have some suicidal ideation.  ACT team is  scheduled to see him today.  As long as he continues to improve, I  suspect he should be ready to be transferred to an inpatient facility if  ACT team feels that is necessary.   PHYSICAL EXAMINATION:  VITAL SIGNS:  Temperature is 98.4, pulse 80,  respirations 20, blood pressure 162/72.  GENERAL:  The patient is well-  developed, well-nourished in no distress.  HEART:  Regular rate and rhythm.  LUNGS:  Clear to auscultation bilaterally.  ABDOMEN:  Soft, nontender and nondistended.  EXTREMITIES:  Positive pulses.   LABORATORY DATA:  Today, he had a white count of 8.5, hemoglobin 10.3,  hematocrit 29.6, platelet count 141, blood sugars are 126.   ASSESSMENT/PLAN:  1. Pneumonia.  We will go ahead and recheck a chest x-ray on him      today.  His white count is coming down.  I suspect he can be      discharged to an inpatient facility in the next 1-2 days.  2. Hypokalemia is corrected.  3. Chronic obstructive pulmonary disease.  We will continue with      nebulizer treatments.  4. Post-traumatic stress disorder.  We will continue to monitor with a      sitter.  5. Suicidal ideation.  The patient has suicide precautions in place.      Dorris Singh, DO  Electronically Signed     CB/MEDQ  D:  06/20/2008  T:  06/20/2008  Job:  [id number]

## 2010-12-17 NOTE — H&P (Signed)
[name], [name]               ACCOUNT NO.:  [id number]   MEDICAL RECORD NO.:  [id number]          PATIENT TYPE:  IPS   LOCATION:  0300                          [facility]  [facility]   PHYSICIAN:  Anselm Jungling, MD  DATE OF BIRTH:  Dec 26, 1940   DATE OF ADMISSION:  04/27/2008  DATE OF DISCHARGE:                       PSYCHIATRIC ADMISSION ASSESSMENT   TIME:  11 a.m.   IDENTIFYING INFORMATION:  This is a 70 year old Caucasian male.  This is  a voluntary admission.   HISTORY OF PRESENT ILLNESS:  This is one of several admissions for this  70 year old who is well known to [place].  He has a history of depression and  also of alcohol abuse.  He had been admitted to the medical unit on  April 25, 2008 with an acute exacerbation of COPD and expressed to  the medical staff that he was having some suicidal thoughts.  He has  endorsed some ongoing depression.  Reports that he has been compliant  with his medications, but feeling sad with low energy and fears for his  safety.  He is concerned that he might overdose if he returns home at  this time.  Not able to contract for safety.  He did drink some alcohol  last week and his alcohol level on admission was less than 5.   PAST PSYCHIATRIC HISTORY:  One of several psychiatric admissions for  this is 70 year old who lives in [place], [place].  He has a  history of substance abuse and depression.  He denies a history of prior  suicide attempts.   SOCIAL HISTORY:  Single Caucasian male who endorses some social  isolation.  He is on disability and receives social security disability  income.  He has 2 sons ages 7 and 68.  He has been married once in the  past and is now currently divorced.  He has an eleventh grade education.  He did have [employer], but has not been active with the [employer].  Does not obtain services from them.  He is a [place]  veteran.  Reports that he is financially stable.   MEDICAL HISTORY:   Primary care physician is Dr. Della Goo.  Medical problems are acute exacerbation of COPD.  He also has a history  of hypertension, depression, some history of urinary retention for which  he has received urology consult while hospitalized, history of  degenerative disk disease and coronary artery disease.  Distant history  of PTSD related to his [employer].   CURRENT MEDICATIONS:  1. Albuterol nebulizer b.i.d. p.r.n.  2. He uses a Combivent inhaler twice a day.  3. He is currently on a prednisone taper 40 mg p.o. b.i.d. for 3 days;      then decreasing to 20 mg p.o. b.i.d. for 3 days; 10 mg b.i.d. for 3      days; then 10 mg p.o. daily for 3 days.  4. Doxycycline 100 mg b.i.d.  5. He uses 100 mg of Seroquel at night.  6. Paxil 20 mg p.o. b.i.d.   DRUG ALLERGIES:  NO KNOWN DRUG ALLERGIES.  PHYSICAL EXAMINATION:  Physical exam was done on the medical unit as  noted in the record.  Labs are noted in the record and generally  unremarkable.  Today, he is fully alert, pleasant cooperative.  Vital  signs within normal limits.  He has been up and attending all groups.  Fully alert, does endorse some depressed mood.  Speech is normal.  Affect appropriate.  Thought process logical and coherent.  No evidence  of psychosis.  No delusional statements.  Thinking is linear.  Does  endorse some passive suicidal thoughts.  Fears that he may overdose if  he goes home.  Does not feel safe.  Cognition is fully preserved.   AXIS I:  Depressive disorder not otherwise specified.  Alcohol abuse,  rule out dependence.  History of posttraumatic stress disorder.  AXIS II:  Deferred.  AXIS III:  Chronic obstructive pulmonary disease, acute exacerbation.  Hypertension.  Urinary retention.  AXIS IV:  Some social isolation, moderate, chronic.  AXIS V:  Current 49, past year 46.   PLAN:  Voluntarily admit him to alleviate his suicidal thought.  We are  going to continue his current meds at this  point.  Try to touch base  with his family and evaluate his support system.      Margaret A. Lorin Picket, N.P.      Anselm Jungling, MD  Electronically Signed    MAS/MEDQ  D:  04/28/2008  T:  04/29/2008  Job:  [phone]
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
## 2010-12-17 NOTE — Procedures (Signed)
[name], [name]               ACCOUNT NO.:  [id number]   MEDICAL RECORD NO.:  [id number]          PATIENT TYPE:  INP   LOCATION:  A211                          FACILITY:  [facility]   PHYSICIAN:  Richard A. Alanda Amass, M.D.DATE OF BIRTH:  Oct 13, 1940   DATE OF PROCEDURE:  06/09/2007  DATE OF DISCHARGE:                                ECHOCARDIOGRAM   PROCEDURE:  Two-dimensional echocardiogram.   This 2-D echocardiogram was technically adequate.   INDICATIONS:  The patient is a 70 year old man with diagnosis of COPD,  CAD, history of past myocardial infarction and 2-D echocardiogram was  done to assess LV function.   FINDINGS:  The aorta is normal at 3.2 cm.   The aortic valve has three leaflets and mild aortic sclerosis, but no  aortic stenosis.  Good opening of valve is seen.  There is no aortic  regurgitation.   Pulmonic valve was not well-seen.   Left atrium is mildly enlarged at 4.2 cm.  The patient was in sinus  rhythm and no clots were seen.   IVS and LVPW are concentrically thickened to 1.4 cm compatible with  moderate concentric left ventricular hypertrophy.  There is normal  contraction pattern.   Mitral valve shows mild mitral annular calcification with normal  opening.  There was mild mitral regurgitation.  There was mild tricuspid  regurgitation with a normal appearing valve.   Left ventricular internal dimensions are within normal limits.  LVIDD  equal 4.2.  LVISD equal 3.3 cm.  There was normal systolic thickening  throughout and there are no wall motion abnormalities.  Systolic  function is normal with an estimated EF greater than 55%.   There is no pericardial effusion.   Right ventricle appears normal.   Left ventricular inflow signal shows E to A reversal compatible with  diastolic relaxation abnormality.   IMPRESSION:  Two-dimensional echocardiogram demonstrates normal systolic  function with mild diastolic relaxation abnormality and moderate  concentric left ventricular hypertrophy.  There is no significant  valvular disease noted.      Richard A. Alanda Amass, M.D.  Electronically Signed     RAW/MEDQ  D:  06/11/2007  T:  06/11/2007  Job:  [id number]

## 2010-12-17 NOTE — Discharge Summary (Signed)
[name], [name]               ACCOUNT NO.:  [id number]   MEDICAL RECORD NO.:  [id number]          PATIENT TYPE:  INP   LOCATION:  A308                          FACILITY:  [facility]   PHYSICIAN:  Lonia Blood, M.D.      DATE OF BIRTH:  [birth year]   DATE OF ADMISSION:  06/18/2008  DATE OF DISCHARGE:  LH                               DISCHARGE SUMMARY   PRIMARY CARE PHYSICIAN:  The patient is unassigned to [place].   DISCHARGE DIAGNOSES:  1. Community-acquired pneumonia involving the left lower lobe.  2. Severe chronic obstructive pulmonary disease.  3. Suicidal ideation.  4. Medication noncompliance.  5. Hypertension.  6. Alcoholism.  7. Severe depression.  8. Degenerative disk disease.  9. History of myocardial infarction.  10.History of posttraumatic stress disorder.   DISCHARGE MEDICATIONS:  1. Avelox 400 mg daily for three more days.  2. Mucinex  600 mg p.o. b.i.d.  3. Prednisone taper starting at 60 mg b.i.d. for 3 days then 40 mg      p.o. b.i.d. for 3 days then 20 mg p.o. b.i.d. for 3 days then 10 mg      p.o. b.i.d. for 3 days then 10 mg p.o. daily for 3 days.  4. Nicotine patch 7 mg daily.  5. Protonix 40 mg daily.  6. Paxil 20 mg b.i.d.  7. Seroquel 50 mg nightly.  8. Senokot-S one tablet daily.  9. Trazodone 50 mg nightly.  10.Xopenex nebulizer 0.63 mg twice a day p.r.n.   DISPOSITION:  The patient will likely go to another inpatient psych  unit.  He has been in and out multiple times and he seems like he is  ready to go back again.   PROCEDURES PERFORMED THIS ADMISSION:  Included chest x-ray performed on  November 15, that showed left lower lobe density suspicious for  pneumonia, as well as emphysema.  A follow-up chest x-ray on November  17, showed COPD and improving left lower lobe infiltrate.   CONSULTATIONS:  The ACT Team for psych.   BRIEF HISTORY AND PHYSICAL:  Please refer to dictated history and  physical by Skeet Latch, DO.  In short however, Mr.  Troiani is well-  known to our service, he is a frequent flyer who came in with shortness  of breath this time around.  The patient had some cough that has been  going on for about a week.  He was also having depressive symptoms with  suicidal ideation.  His exam showed the chest x-ray as indicated above.  He was afebrile, but was wheezing at the time of admission.  There was  decreased breath sounds bilaterally and also some rhonchi.  The patient  was subsequently admitted with diagnosis of left lower lobe pneumonia,  as well as COPD exacerbation.[name]   HOSPITAL COURSE:  1. Pneumonia.  The patient was admitted, started on IV Rocephin and      Zithromax.  His symptoms seemed to be improving.  His shortness of      breath has decreased.  He is currently back to his baseline it  looks like.  2. COPD with exacerbation.  Again, the patient has been placed on IV      steroids, antibiotics and nebulizers and his symptoms have improved      tremendously.  3. Suicidal ideation.  The patient has been placed on suicide      precaution with a sitter since admission.  He is currently getting      ready to be transferred to inpatient psych facility.  4. Other medical problems otherwise were stable and the patient is      ready for transfer.      Lonia Blood, M.D.  Electronically Signed     LG/MEDQ  D:  06/22/2008  T:  06/22/2008  Job:  [id number]

## 2010-12-17 NOTE — H&P (Signed)
[name], [name]:  [id number]   MEDICAL RECORD NO.:  [id number]          PATIENT TYPE:  IPS   LOCATION:  0302                          [facility]   PHYSICIAN:  Jasmine Pang, M.D. DATE OF BIRTH:  1941-02-12   DATE OF ADMISSION:  12/21/2007  DATE OF DISCHARGE:                       PSYCHIATRIC ADMISSION ASSESSMENT   IDENTIFYING INFORMATION:  This is a 70 year old male who was voluntarily  admitted on Dec 21, 2007.   HISTORY OF PRESENT ILLNESS:  The patient presents with a history of  depression, feeling very hopeless, reporting to others that I do not  care if I live or die.  Unable to contract for safety.  He does report  having a beer yesterday.  He states he has lost about 10-15 pounds and  has not been sleeping, sleeping only 2 hours at a time.  Reports  difficulty with his medications being able to obtain them due to  transportation, isolation and financial issues.   PAST PSYCHIATRIC HISTORY:  The patient was in our facility on November 27, 2007 for alcohol use and depressive  symptoms.  His followup is unclear  at this time.   SOCIAL HISTORY:  He is a 71 year old male who lives alone and receives  [facility].   FAMILY HISTORY:  Father with history of depression.   ALCOHOL/DRUG HISTORY:  The patient again has a history of some binge  drinking, but states that yesterday he only had one beer.  Denies any  drug use.   PRIMARY CARE Abigail Marsiglia:  [place] in [place].   MEDICAL PROBLEMS:  1. COPD.  2. Degenerative disk disease.  3. Hypertension.   MEDICATIONS:  1. Ventolin b.i.d.  2. Albuterol inhaler b.i.d. p.r.n.  3. Lamictal 100 mg, questionable noncompliance.  Patient unable to say      when his last use of medicines was.  4. Hydrocodone p.r.n.  5. Albuterol nebulizer treatments.  6. Advair 250/50 one inhalation b.i.d.  7. Oxygen 2.5 liters p.r.n.  8. Klonopin 0.5 mg t.i.d.  9. Paxil 20 mg p.o. b.i.d.   DRUG ALLERGIES:  NO KNOWN  ALLERGIES.   PHYSICAL EXAMINATION:  GENERAL:  The patient was assessed at [facility].  He is a short-statured male.  He is currently wearing his O2  at 2.5 liters.  He is in mild respiratory distress.  He denies any  complaints at this time.  VITAL SIGNS:  BP 128/73, 106 heart rate, 20 respirations.  SKIN:  Warm and dry.   LABORATORY DATA:  Alcohol level was 46 on arrival to the emergency room.  Urine drug screen is negative.  WBC count was elevated at 19.   DIAGNOSTICS:  His EKG was reviewed and it showed sinus tach.   MENTAL STATUS EXAM:  This is a fully alert, short-statured male resting  in his room.  He is casually dressed and somewhat disheveled, wearing  his O2 per cannula.  His speech is clear.  Normal pace and tone.  The  patient's mood is depressed.  The patient is pleasant.  He seems  somewhat anxious.  His thought processes  are coherent.  No evidence of  any delusional thinking.  He denies any auditory hallucinations.  Promises safety on the unit.  Cognitive function:  The patient is  oriented x4.  His memory is fair.  Somewhat of a poor historian in  regards to his medical history, providers prescribing medications and  compliance.  His judgment and insight is fair.   AXIS I:  Major depressive disorder, posttraumatic stress disorder,  alcohol abuse.  AXIS II:  Deferred.  AXIS III:  Chronic obstructive pulmonary disease, degenerative disk  disease, recent onset of pneumonia.  AXIS IV:  Psychosocial problems.  Medical problems, problems with access  to healthcare and receiving medications.  AXIS V:  Current is 35.   PLAN:  We will monitor O2.  Continue with the patient's oxygen as  needed.  We will have Librium available for his recent use of alcohol  and discontinue his Klonopin.  We will resume his Paxil.  We will also  repeat his WBC count.  Assist the patient as needed.  We will have case  manager assess his living situation.  The patient does report  that he  may want to sell his home and move into an assisted living center.  We  will also reinforce medication compliance and assess his alcohol use.  His tentative length of stay is 4-6 days or more depending on placement  issues.      Landry Corporal, N.P.      Jasmine Pang, M.D.  Electronically Signed    JO/MEDQ  D:  12/23/2007  T:  12/23/2007  Job:  [id number]

## 2010-12-17 NOTE — Discharge Summary (Signed)
NAMEJAFARI, MCKILLOP               ACCOUNT NO.:  [id number]   MEDICAL RECORD NO.:  [id number]          PATIENT TYPE:  INP   LOCATION:  A314                          FACILITY[facility]  [facility]   PHYSICIAN:  Corinna L. Lendell Caprice, MDDATE OF BIRTH:  Feb 09, 1941   DATE OF ADMISSION:  03/30/2009  DATE OF DISCHARGE:  09/02/2010LH                               DISCHARGE SUMMARY   DISCHARGE DIAGNOSES:  1. Chronic obstructive pulmonary disease exacerbation secondary to      acute bronchitis.  2. Hyponatremia.  3. PTSD.  4. Degenerative joint disease.  5. Deconditioning.  6. Steroid-induced hyperglycemia.  7. Chronic pain.  8. Continued tobacco abuse, counseled against.  9. Anemia.   DISCHARGE MEDICATIONS:  1. Continue 2 liters of oxygen at home.  2. Albuterol inhaler every 4 hours as needed for wheeze.  3. Pravastatin 40 mg a day.  4. Cyanocobalamin 1000 mcg a day.  5. Oxycodone 5 mg every 4 hours as needed for pain.  6. Prednisone taper continue four more days of azithromycin 250 mg      p.o. daily.  7. He has been started on Spiriva 1 puff daily.   CONDITION:  Stable.   ACTIVITY:  Continue to use a walker and home physical therapy and  occupational therapy has been arranged.   FOLLOWUP:  Follow up with primary care physician 2-4 weeks which nurse  care manager is trying to arrange currently.   CONSULTATIONS:  None.   PROCEDURE:  None.   LABS:  CBC is significant for hemoglobin of 11.5, hematocrit 32, sodium  on admission was 130 and on the 31st was 140.  Magnesium, phosphorus  normal.  Hemoglobin A1c was 5.2.  LDL of 37, HDL 50, triglycerides 93,  total cholesterol 106.  TSH 2.060.  Urinalysis showed trace blood,  otherwise unremarkable.   DIAGNOSTICS:  Chest x-ray on admission showed stable mild changes of  COPD, nothing acute.  EKG showed normal sinus rhythm, age  indeterminate  anterior infarct.   HISTORY AND HOSPITAL COURSE:  Mr. Bradd Burner is a 70 year old white male with  a history  of COPD who presents with shortness of breath and weakness.  He was having difficulty standing.  He continues to smoke cigarettes but  has cut down from two packs a day to about five or six cigarettes a day.  He also had worsening of his chronic cough.  He is on home oxygen.  He  is usually seen at the [facility] in [place].  He was afebrile  on admission, had normal oxygen saturations on 2 liters, normal heart  rate.  His respiratory rate was 24.  He had decreased breath sounds and  rhonchi with bilateral wheeze.  He was admitted and started on steroids,  nebulizers, and eventually antibiotics.  He was seen by physical therapy  and occupational therapy and a home walker has been arranged.  The  option of short-term skilled nursing facility was mentioned but the  patient declined.  At the time of discharge, his breathing was better.  We have started him on Spiriva.  I have encouraged him  to quit smoking.  He would like a primary care physician closer to home and I have asked  the nurse care manager to try to arrange this.  He will need follow-up  in 2-4 weeks.   He did have steroid-induced hyperglycemia which was managed with sliding  scale insulin.  The rest of his medical problems remained stable during  his hospitalization.  Total time on the day of discharge is 40 minutes.      Corinna L. Lendell Caprice, MD  Electronically Signed     CLS/MEDQ  D:  04/05/2009  T:  04/05/2009  Job:  [id number]

## 2010-12-17 NOTE — Discharge Summary (Signed)
NAMECLEARNCE, LEJA               ACCOUNT NO.:  [id number]   MEDICAL RECORD NO.:  [id number]          PATIENT TYPE:  INP   LOCATION:  1517                         FACILITY:  [facility]   PHYSICIAN:  Lonia Blood, M.D.      DATE OF BIRTH:  1940/11/04   DATE OF ADMISSION:  12/26/2007  DATE OF DISCHARGE:  01/02/2008                               DISCHARGE SUMMARY   PRIMARY CARE PHYSICIAN:  The patient is unassigned to [place].   DISCHARGE DIAGNOSES:  1. Acute chronic obstructive pulmonary disease exacerbation, now      resolved.  2. Degenerative disk disease.  3. Severe depression.  4. Hypertension.  5. Alcohol abuse.  6. History of tobacco abuse, recently quit.   DISCHARGE MEDICATIONS:  1. Aspirin 81 mg daily.  2. Clonazepam 0.5 mg t.i.d.  3. Cardizem 60 mg p.o. b.i.d.  4. Lantus insulin 7 units at night with NovoLog sliding scale.  5. Atrovent inhaler 4 times a day.  6. Lamictal 100 mg daily.  7. Albuterol nebulized inhaler 1 puff 4 times a day.  8. Avelox 400 mg daily for 3 more days.  9. Nicotine patch 25 mg daily.  10.Paxil 20 mg b.i.d.  11.Flomax 0.4 mg daily.   DISPOSITION:  The patient will be transferred back to [facility] where he came from.  His COPD has stabilized and not wheezing at  this point.  She might require oxygen as an inpatient for any exertional  activities.   PROCEDURES PERFORMED:  Chest x-ray performed on May 23 that showed mild  air space opacity in the posterolateral right lower lobe.  There is  suspicion for pneumonia as well as COPD.   CONSULTATIONS:  None.   BRIEF HISTORY AND PHYSICAL:  Please refer to dictated history and  physical on admission by Dr. Della Goo.  In short, however, this  is a 70 year old male with known history of COPD, who was admitted into  Behavioral Health on May 19 for severe depression as well as alcohol  detox.  The patient experienced severe shortness of breath on the 23rd  and was sent over to [facility] for further management.  The patient  was found to be wheezing and using his accessory muscles of respiration.  With his baseline history of COPD and x-ray findings, the patient was  diagnosed with COPD probably triggered by a mild pneumonia.  The patient  was subsequently admitted for further management in the hospital.   HOSPITAL COURSE:  PROBLEM #1 - CHRONIC OBSTRUCTIVE PULMONARY DISEASE:  The patient was admitted and placed on Xopenex and Atrovent as well as  antibiotics.  He had this is very short course of steroids.  He seems to  have turned around now, breathing okay, no problems and no wheezing.  No  fever.  His laboratories also showed that white count is essentially  within normal.  Subsequently, we are discharging him on albuterol and  Atrovent inhalers as well as complete antibiotics for 4 days.   PROBLEM #2 - DEPRESSION:  The patient was reevaluated by Psychiatry, who  recommended continued  inpatient care, as he is still severely depressed.   PROBLEM #3 - URINARY HESITANCY: The patient complained of urinary  hesitancy and frequency.  He has had apparently prior prostate  surgeries.  We started him on Flomax for now; if that fails, he will  need urology followup in the future.   PROBLEM #4 - DIABETES:  This seems to be well controlled on his current  regimen.   PROBLEM #5 - ALCOHOL ABUSE:  Again, the patient is returning to  inpatient psychiatric care for further management.  Otherwise, the  patient is currently stable for discharge.      Lonia Blood, M.D.  Electronically Signed     LG/MEDQ  D:  01/02/2008  T:  01/02/2008  Job:  [id number]

## 2010-12-17 NOTE — Discharge Summary (Signed)
NAMEJAVOHN, [name]:  [id number]   MEDICAL RECORD NO.:  [id number]          PATIENT TYPE:  IPS   LOCATION:  0305                          [facility]   PHYSICIAN:  Jasmine Pang, M.D. DATE OF BIRTH:  07-10-1941   DATE OF ADMISSION:  01/02/2008  DATE OF DISCHARGE:  01/05/2008                               DISCHARGE SUMMARY   IDENTIFICATION:  This is a 70 year old single white male who was  readmitted to [facility] on Jan 02, 2008.   HISTORY OF PRESENT ILLNESS:  The patient was admitted to [facility] on Dec 21, 2007, for depression, suicidal ideation, and alcohol abuse.  He was  transferred to the medical floor for shortness of breath and wheezing.  He had pneumonia, he was treated there for this.  He was seen in consult  by Dr. Electa Sniff who recommended the patient will be readmitted to [facility] for  further psychiatric treatment.  The patient continued to be depressed  and anxious and hopeless.   PAST PSYCHIATRIC HISTORY:  As indicated above.  The patient has been  here in the past.  He states he has a history of PTSD from serving in  the [place] war.  States this has gotten worse as he gets over.   ALCOHOL AND DRUG HISTORY:  The patient has a history of alcohol abuse.   MEDICAL PROBLEMS:  Left lower lobe pneumonia, degenerative disk disease,  hypertension.   MEDICATIONS:  1. Aspirin 81 mg daily.  2. Klonopin 0.5 mg p.o. t.i.d.  3. Cardizem 60 mg b.i.d  4. Lantus 7 units daily.  5. Flomax 0.4 mg daily.  6. Paxil 20 mg b.i.d.  7. Avelox 400 mg x3 more days.  8. Lamictal 100 mg daily.  9. Atrovent 4 times daily.   PHYSICAL FINDINGS:  There were no acute physical or medical problems  noted.  The patient's pneumonia was resolving.   HOSPITAL COURSE:  Upon admission, the patient was restarted on aspirin  81 mg daily, clonazepam 0.5 mg p.o. t.i.d., Cardizem 60 mg p.o. b.i.d.,  Lantus with NovoLog 7 units subcutaneously h.s., Atrovent inhaler  q.i.d., Lamictal 100  mg daily, albuterol nebulizer inhaler 1 puff  q.i.d., Avelox 400 mg daily x3 more days, Nicotine patch 21 mg topical  daily, Paxil 20 mg twice daily, Flomax 0.4 mg p.o. daily, and the  patient was also started on a glycemic control sliding scale insulin  regimen.  He was also started on hydrocodone one q.6 h p.r.n., pain.  In  individual sessions with me, the patient was reserved initially and  irritable with psychomotor retardation.  Speech was soft and slow.  He  was upset because his belongings had been lost in the transfer from the  inpatient medical unit to our unit.  As hospitalization progressed, he  stated he was beginning to feel better.  His sleep was good.  Appetite  was good.  He was focused on his recent pneumonia and medical  stabilization.  He stated he is worried about his illness and health.  He discussed being alcohol withdrawn when the PTSD  acts up.  The  patient also began to participate appropriately in unit therapeutic  groups and activities.  On January 05, 2008, mental status had improved  markedly from admission status.  Mood was less depressed and less  anxious.  Affect consistent with mood.  There was no suicidal or  homicidal ideation.  No thoughts of self-injurious behavior.  No  auditory or visual hallucinations.  No paranoia or delusions.  Thoughts  were logical and goal-directed.  Thought content, no predominant theme.  The patient wanted to go home and stated he felt safe, would not harm  himself.   DISCHARGE DIAGNOSES:  Axis I:  Depressive disorder, not otherwise  specified.  Alcohol dependence.  Axis II:  None.  Axis III:  Chronic obstructive pulmonary disease, hypertension,  degenerative disk disease, recent pneumonia resolved.  Axis IV:  Moderate (problems with primary support group, medical  problems, burden of psychiatric illness).  Axis V:  Global assessment of functioning was 50 upon discharge.  GAF  was 40 upon admission.  GAF highest past year was  60.   DISCHARGE PLANS:  There was no specific activity level or dietary  restriction.   POSTHOSPITAL CARE PLANS:  The patient will return to the [place] for his  psychiatric follow up.   DISCHARGE MEDICATIONS:  1. Paxil 20 mg twice a day.  2. Klonopin 0.5 mg t.i.d.  3. Lamictal 100 mg daily.  4. Aspirin 81 mg daily.  5. Cardizem 60 mg twice a day.  6. Ventolin inhaler 1 puff four times a day.  7. Flomax 0.4 mg daily.  8. Lantus 7 units subcutaneously at bedtime.  9. Atrovent 1 puff four times a day.  Pain medications as prescribed per primary care physician.  He will also  follow up with his medical doctor in 1-2 weeks for the reevaluation of  his respiratory status given his recent pneumonia and other health  problems and prescriptions for his medical issues.      Jasmine Pang, M.D.  Electronically Signed     BHS/MEDQ  D:  01/05/2008  T:  01/05/2008  Job:  [id number]

## 2010-12-17 NOTE — Discharge Summary (Signed)
[name], [name]               ACCOUNT NO.:  [id number]   MEDICAL RECORD NO.:  [id number]          PATIENT TYPE:  INP   LOCATION:  A321                          FACILITY:  [facility]   PHYSICIAN:  Lonia Blood, M.D.      DATE OF BIRTH:  12/16/1940   DATE OF ADMISSION:  04/25/2008  DATE OF DISCHARGE:  09/24/2009LH                               DISCHARGE SUMMARY   PRIMARY CARE PHYSICIAN:  The patient is unassigned to [place].   DISCHARGE DIAGNOSES:  1. Acute chronic obstructive pulmonary disease exacerbation.  2. Suicidal ideation.  3. Urinary retention.  4. Alcoholism.  5. Hypertension.  6. Degenerative disk disease.  7. Severe depression.  8. Coronary artery disease.  9. Positive traumatic stress disorder.   DISCHARGE MEDICATIONS:  1. Albuterol nebulizer 2.5 mg q.4 h.  2. Sliding scale insulin using NovoLog.  3. Prednisone 40 mg p.o. b.i.d. for 3 days then 20 mg p.o. b.i.d. for      3 days then 10 mg p.o. b.i.d. for 3 days then 10 mg p.o. daily for      3 days.  4. Nicotine patch 40 mg daily.  5. Protonix 14 mg daily.  6. Protonix 40 mg daily.  7. Seroquel 100 mg q.h.s.   DISPOSITION:  The patient will be discharged to [facility]  today as he is still suicidal, has a sitter with him.   PROCEDURES PERFORMED:  Chest x-ray on admission showed bibasilar  atelectasis and bronchitic changes which are chronic.   CONSULTATIONS:  Dennie Maizes, M.D., urology.   BRIEF HISTORY AND PHYSICAL:  Please refer to dictated history and  physical on admission by Dr. Dorris Singh.  In short, however, this  is a 69 year old gentleman with prior history of alcoholism and tobacco  abuse with known COPD exacerbation.  He has had multiple admissions here  for COPD exacerbation.  He came in with shortness of breath.  Also  having suicidal ideation.  The patient was found to be hypertensive on  admission and with decreased breath sounds and mild expiratory wheezing.  His sats were also  on the low side.  He was subsequently admitted for  further management of COPD exacerbation.   HOSPITAL COURSE:  1. COPD exacerbation.  The patient was admitted, started on IV      antibiotics and nebulizers and IV Solu-Medrol.  The patient's      breathing has improved.  He is still on oxygen at 2 liters but he      is back to his baseline at this point.  2.  Suicidal ideation.  The      patient continues to feel suicidal since admission.  He has been      evaluated by the ACT team and he is being transferred for inpatient      psychiatric care.  2. Hypertension.  Blood pressure has been controlled on his home      regimen.  3. Alcoholism.  He has been counseled.  He will need further      evaluation at the [facility].  4. Depression.  Again, the patient is severely depressed and suicidal.      The goal now is to get him into inpatient care and hopefully that      should take care of his problem.  Otherwise the patient is stable.      We will proceed with discharge.      Lonia Blood, M.D.  Electronically Signed     LG/MEDQ  D:  04/27/2008  T:  04/27/2008  Job:  [id number]

## 2010-12-20 NOTE — Discharge Summary (Signed)
NAMEREEDER, BRISBY NO.:  [id number]   MEDICAL RECORD NO.:  [id number]          PATIENT TYPE:  IPS   LOCATION:  0505                          FACILITY[facility]  [facility]   PHYSICIAN:  Geoffery Lyons, M.D.      DATE OF BIRTH:  [birth year]   DATE OF ADMISSION:  09/21/2006  DATE OF DISCHARGE:  09/30/2006                               DISCHARGE SUMMARY   ADMITTING DIAGNOSES:   AXIS I:  1. Major depression with psychotic features.  2. Posttraumatic stress disorder.  3. Alcohol abuse; rule out dependence.   AXIS II:  No diagnosis.   AXIS III:  1. Chronic obstructive pulmonary disease.  2. Hypertension.  3. Gastroesophageal reflux.   AXIS IV:  Moderate.   AXIS V:  Upon admission 30.  His GAF in the last year is 22.   DISCHARGE DIAGNOSES:   AXIS I:  1. Major depression with psychotic features.  2. Posttraumatic stress disorder.  3. Alcohol abuse.   AXIS II:  No diagnosis.   AXIS III:  1. Chronic obstructive pulmonary disease.  2. Hypertension.  3. Gastroesophageal reflux.   AXIS IV:  Moderate.   AXIS V:  Upon discharge 50-55.   CHIEF COMPLAINT AND PRESENT ILLNESS:  This was the second admission to  [facility] for this 70 year old, divorced, white male,  involuntarily committed.  He reports having a history of PTSD, feeling  depressed, having symptoms of anxiety.  Most recent stressor, he had to  put his dog of about 15 years to sleep.  Reports some drinking recently.  He drank one beer the day prior to this admission.  Reports a history of  binge drinking when he does drink.  Also experiencing auditory  hallucinations telling him to get rid of them meaning his ex-wife,  having homicidal thoughts towards her.   PAST PSYCHIATRIC HISTORY:  Three admissions to [facility], the  last time April 2007.  Also has been in the [facility], has been on  Paxil in the past.   ALCOHOL AND DRUG HISTORY:  Some recent alcohol use.  No other  substances.   MEDICAL HISTORY:  1. Emphysema.  2. Hypertension.  3. Gastroesophageal reflux.   MEDICATIONS:  1. A predinsone taper.  2. Darvocet N-100 one or two every 12 hours.  3. Levaquin 500 mg daily.  4. Aspirin enteric-coated 81 mg per day.  5. Mucinex-ER 600 mg one to two tabs every 12 hours as needed.  6. Advair 250/50 twice a day.  7. Albuterol inhaler two puffs every 4 hours.  8. Nebulizer treatment every 4 hours as needed.  9. Also has been on oxygen 2 liters at home.   Physical exam performed failed to show any acute findings.   LABORATORY WORKUP:  TSH 1.319.  Drug screening negative for substances  of abuse.  CBC:  White blood cells 10.9, hemoglobin 12.4.  Glucose 126,  BUN 14, creatinine 0.7, SGPT 18, sodium 143, potassium 3.8.   MENTAL STATUS EXAM:  An alert, cooperative male.  Good eye contact.  Casually dressed.  Speech was clear.  Mood  was anxious, depressed.  Affect constricted.  Thought processes were clear, rational, and goal  oriented.  Homicidal ideas towards the ex-wife.  Endorsed auditory  hallucinations, voices telling him to hurt his wife.  No active  delusions.  Cognition well preserved.   COURSE IN THE HOSPITAL:  He was admitted.  He was started in individual  and group psychotherapy.  He was maintained on his medication.  He was  given some Librium.  He was started on Abilify 5 mg at night and Remeron  SolTabs 30 mg at bedtime as he was having a hard time with sleep.  As  already stated, a 70 year old male, divorced, two children, 41 and 16, a  [place] veteran, history of PTSD, and claims anxiety, depression, and  nerves.  Endorsed binge drinking, not often.  Claims he had one beer  Sunday.  Endorsed voices telling him to kill his ex-wife.  Hardly sees  her and would not do it.  As a possible trigger for the decompensation,  says that he put his dog to sleep, and this hurt him a lot.  Had been on  Paxil in the past successfully.  Endorsed also  that he has been having  exacerbation of the PTSD symptoms.  Was on Wellbutrin and Abilify the  last time he was hospitalized, but endorsed that he went off these  medications.  Did admit that he had a hard time keeping all the  medications.  He ran out of some of them and let go of the Abilify and  the Remeron, cannot say why, but endorsed that he was not hearing the  voices when he was on the Abilify so we went ahead and restarted this  medication.  Initially had some sedation getting used to the medication.  The voices were worse so in the morning.  He started tolerating the  Abilify well, some difficulties in the morning but as the  hospitalization progressed not as sedated, heard murmurs in the morning,  not clearly defined words.  Endorsed anxiety, but by February the 25th  endorsed he was better.  Continued to endorse decrease in the voices  without a clear, distinctive voice.  Would like to pursue this further.  Wanting to become his own payee as his ex-wife was the payee, but at the  same time, when he was in charge of his money, he was indeed drinking a  lot.  He started improving.  He developed a head cold, but he was  treated symptomatically.  His mood improved, his affect became brighter,  and by February the 27th, he was in full contact with reality.  There  were no active suicidal or homicidal ideas, no hallucinations, no  delusions, drastically better, felt he was ready to go.  No further  hallucinations.  The medications were working, and he felt he was ready  to go home.   DISCHARGE MEDICATIONS:  1. Darvocet N-100/650 one twice a day.  2. Aspirin 81 mg per day.  3. Advair 250/50 Discus one puff twice a day.  4. Remeron SolTabs 30 mg at night.  5. Abilify 10 mg at bedtime.  6. Albuterol two puffs every 4 hours as needed.  7. Mucinex 600 one to two every 12 hours.  8. Atrovent 0.5 every 4 hours as needed.  9. Ventolin 2.5 one every 4 hours as needed.  Follow up at  [facility].      Geoffery Lyons, M.D.  Electronically Signed  IL/MEDQ  D:  10/15/2006  T:  10/15/2006  Job:  [id number]
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
## 2010-12-20 NOTE — Consult Note (Signed)
[name], [name]               ACCOUNT NO.:  [id number]   MEDICAL RECORD NO.:  [id number]          PATIENT TYPE:  AMB   LOCATION:  ENDO                         FACILITY:  [facility]   PHYSICIAN:  Jordan Hawks. Elnoria Howard, MD    DATE OF BIRTH:  [birth year]   DATE OF CONSULTATION:  DATE OF DISCHARGE:                                   CONSULTATION   GI CONSULTATION:   REASON FOR CONSULTATION:  Dysphagia and laryngitis.   REFERRING PHYSICIAN:  Dr. Geralyn Flash.   HISTORY OF PRESENT ILLNESS:  This is a 70 year old gentleman with a past  medical history of alcohol abuse, COPD, degenerative disk disease and  gastroesophageal reflux disease who is admitted to the [facility] for complaints of suicidal ideation and history of alcohol abuse.  The patient is currently being evaluated from the psychiatric standpoint,  however during his hospitalization the patient also complained of having  dysphagia.  The dysphagia started approximately 2 months ago and he reports  being evaluated at the [facility]  At that time, he  underwent an EGD and he states that he had a hiatal hernia and he was also  to have a dilation.  Unfortunately, the dilation did occur and I am  uncertain about the reason.  The patient is not very clear.  He says that he  was not provided with any further care.  The dysphagia was apparently  gradually in onset and is also associated with laryngitis.  The patient is  not the best of historians, however he does state having some nocturnal  awakenings and a prior history of gastroesophageal reflux disease.  He has a  long history of alcohol use and tobacco abuse together, however he states  that he has cut back on his smoking to approximately less than 1 pack per  day.   PAST MEDICAL AND SURGICAL HISTORY:  As stated above.   ALLERGIES:  No known drug allergies.   SOCIAL HISTORY:  He lives alone.  He is disabled.   FAMILY HISTORY:  Noncontributory.   REVIEW OF SYSTEMS:  Positive for dysphagia, 7 pound weight loss, some  intermittent nausea and vomiting, no fevers, chills, dysarthria, arthralgia,  dysuria, chest pain, shortness of breath, rashes or neurologic symptoms.   MEDICATIONS:  Tylenol, albuterol, aripiprazole, aspirin, bupropion,  felodipine, gabapentin, lorazepam, metoclopramide, mirtazapine,  multivitamin, and omeprazole.   PHYSICAL EXAMINATION:  Vital signs not available at this time.  Generally,  the patient is in no acute distress, alert and oriented.  HEENT:  Normocephalic and atraumatic, extraocular muscles intact, pupils  equal, round and reactive to light.  NECK:  Supple, no lymphadenopathy.  LUNGS:  Clear to auscultation bilaterally.  CARDIOVASCULAR:  Regular rate and rhythm without murmurs, gallops or rubs.  ABDOMEN:  Flat, soft, nontender, nondistended.  EXTREMITIES:  No clubbing, cyanosis or edema.   LABORATORY DATA:  Not available at this time.   IMPRESSION:  1.  Dysphagia.  2.  Laryngitis.  3.  History of gastroesophageal reflux disease.   After discussion with the patient further evaluation with an EGD with  possible dilation is required.  He has dysphagia to both solids and liquids  and it is not progressive.  I suspect that he may have simply dysmotility  secondary to gastroesophageal reflux disease, however with his long history  of tobacco and alcohol use, the combination of the 2 can result in  development of a squamous cell carcinoma in the esophagus.  Further  evaluation with an EGD is required at this time.   PLAN:  1.  EGD tomorrow.  2.  Check BNP and CBC.  3.  NPO after midnight.      Jordan Hawks Elnoria Howard, MD  Electronically Signed     PDH/MEDQ  D:  11/25/2005  T:  11/26/2005  Job:  [id number]   cc:   Anselm Jungling, MD
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
## 2010-12-20 NOTE — Discharge Summary (Signed)
[name], [name] NO.:  [id number]   MEDICAL RECORD NO.:  [id number]          PATIENT TYPE:  IPS   LOCATION:  0504                          [facility]   PHYSICIAN:  Anselm Jungling, MD  DATE OF BIRTH:  February 16, 1941   DATE OF ADMISSION:  11/24/2005  DATE OF DISCHARGE:  11/27/2005                                 DISCHARGE SUMMARY   IDENTIFYING DATA AND REASON FOR ADMISSION:  This was the second [name] admission for [name], a 70 year old white male with a history  of depression, anxiety, PTSD and alcoholism.  He had gone through alcohol  detoxification during his previous admission here several months ago, and  had remained sober, but was experiencing increasing levels of anxiety  primarily.  He came to [place] with multiple medical problems including COPD,  hypertension, GERD and dysphagia.  Please refer to the admission note for  further details pertaining to the symptoms, circumstances and history that  lead to his hospitalization.  He was given an initial AXIS I diagnosis of  rule out generalized anxiety disorder, PTSD, and alcoholism in remission.   MEDICAL AND LABORATORY:  The patient was medically and physically assessed  by the psychiatric nurse practitioner.  He came to [place] with a history of  COPD, hypertension, and GERD.  For these, he had been taking albuterol  inhaler, Plendil, Reglan and Protonix.  He had been having dysphagia, and a  gastrointestinal consultation was obtained, which resulted in a  recommendation of having an endoscopy.  The assessment that resulted from  this was dysphagia, GERD, gastritis, rule out duodenitis and rule out  Candida duodenitis.  It was recommended that the patient remain on Protonix  40 mg b.i.d.  Biopsies were taken, and the patient was to follow up with Dr.  Elnoria Howard and associates at [facility] following his  discharge from our inpatient psychiatric service.  Otherwise, there were  no  significant medical issues during this brief inpatient psychiatric stay.   Hamilton COURSE:  The patient was admitted to the adult inpatient  psychiatric service.  He presented as a Caucasian male, well-nourished and  normally developed, who appeared older than his chronological age of 70.  He  had appeared to lose some weight since his previous admission.  He was  alert, oriented in all spheres.  He was very pleasant and cooperative  throughout.  His mood was depressed with anxious affect.  He denied suicidal  ideation.  His thoughts and speech were normally organized.  There were no  signs or symptoms of psychosis.   He was continued on a psychotropic regimen consisting of Abilify 15 mg  daily, Wellbutrin SR 100 mg daily, Neurontin 600 mg three times daily, and  Ativan 0.5 mg three times daily, with Remeron 15 mg nightly.  Seroquel 25 mg  at h.s. was given with good results.   By the third Hamilton day, the patient indicated that he was feeling much  better.  He felt that the hospitalization had given him a much needed break  from stressors that  he had been dealing with.  He indicated that he felt  ready to return home and continue on outpatient treatment.   AFTERCARE:  The patient was to follow up with Dr. Lolly Mustache at our outpatient  clinic on Dec 11, 2005.  He was to follow up regarding his medical issues  with physicians mentioned above.   DISCHARGE MEDICATIONS:  1.  Seroquel 25 mg nightly.  2.  Abilify 15 mg nightly.  3.  Wellbutrin SR 100 mg daily.  4.  Neurontin 600 mg three times a day.  5.  Ativan 0.5 mg three times a day.  6.  Remeron 15 mg nightly.  7.  Albuterol inhaler p.r.n.  8.  Plendil 5 mg daily.  9.  Reglan 10 mg t.i.d.  10. Protonix 40 mg b.i.d.   DISCHARGE DIAGNOSES:  AXIS I.  Generalized anxiety disorder and alcohol  dependence in remission.  AXIS II.  Deferred.  AXIS III.  History of dysphagia, gastroesophageal reflux disease, chronic  obstructive  pulmonary disease.  AXIS IV.  Stressors severe.  AXIS V.  Global Assessment of Functioning on discharge 65.           ______________________________  Anselm Jungling, MD  Electronically Signed     SPB/MEDQ  D:  11/28/2005  T:  11/28/2005  Job:  [id number]
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
## 2010-12-20 NOTE — H&P (Signed)
[name], MCGINLEY NO.:  [id number]   MEDICAL RECORD NO.:  [id number]          PATIENT TYPE:  IPS   LOCATION:  0505                          [facility]ACILITY[facility]  [facility]   PHYSICIAN:  Geoffery Lyons, M.D.      DATE OF BIRTH:  1941/02/21   DATE OF ADMISSION:  09/21/2006  DATE OF DISCHARGE:                       PSYCHIATRIC ADMISSION ASSESSMENT   DIAGNOSES:   AXIS I:  1. Depressive disorder, not otherwise specified, with psychotic      features.  2. Posttraumatic stress disorder.  3. Alcohol abuse.   AXIS II:  Deferred.   AXIS III:  1. Chronic obstructive pulmonary disease.  2. Hypertension.  3. Gastroesophageal reflux disease.   AXIS IV:  Psychosocial problems, medical problems.   AXIS V:  Current is 30.   This is a 70 year old, divorced, white male involuntarily committed on  September 21, 2006.   HISTORY OF PRESENT ILLNESS:  The patient reports having a history of  PTSD, feeling depressed, having symptoms of anxiety.  States one of  his most recent stressors is that he had to put his dog of about 15  years to sleep.  He reports some drinking recently.  He drank one beer  the day prior to this admission, but does report a history of binge  drinking when he does drink.  Also experiencing auditory hallucinations  to get rid of them meaning his ex-wife, having homicidal thoughts  towards her.  The patient is here on petition with the papers stating  that he is homicidal with auditory hallucinations.  He reports no  history of any violence.   PAST PSYCHIATRIC HISTORY:  Three admissions here at [facility].  He was last here in April of 2007, last hospitalized in the [place]  in [place].  He has been on Paxil in the past.   SOCIAL HISTORY:  He is a 70 year old,  divorced, white male, has two  grown children, ages 66 and 21.  He has a history of being a [place]  vet.  The patient lives alone, feels he is financially stable, has his  own home.   FAMILY  HISTORY:  Denies.   ALCOHOL AND DRUG HISTORY:  The patient smokes.  He reports some recent  alcohol use, but no drug use.   PRIMARY CARE Eula Mazzola:  [facility].   MEDICAL PROBLEMS:  1. Emphysema.  2. Hypertension.  3. GERD.  4. He was recently in the hospital for exacerbation of his emphysema.   MEDICATIONS:  The patient reports he has been on:  1. A prednisone taper.  2. Darvocet N-100 one or two q.12h.  3. Levaquin 500 mg daily; appears that he has completed that course of      antibiotics.  4. Aspirin, enteric-coated, 81 mg daily.  5. Mucinex-ER 600 mg one to two tabs every 12 hours p.r.n.  6. Advair 250/50 b.i.d.  7. Albuterol inhaler two puffs q.4h p.r.n.  8. Nebulizer treatments every 4 hours p.r.n.  9. Also has been on O2 two liters at home.   DRUG ALLERGIES:  No known allergies.   PHYSICAL EXAMINATION:  The  patient was assessed at [facility].  This is a short-statured male, has some scattered bruises and tattoos  noted.  He appears to be dyspneic on exertion.  His skin color is good.  He is not on any oxygen at this time.  Temperature is 97.3, 86 heart  rate, 20 respirations, blood pressure 152/80, 153 pounds, he is 5 feet 3  inches tall, and 96% saturated.   LABORATORY DATA:  Urine drug screen is negative, alcohol level less than  5, urinalysis is negative.   MENTAL STATUS EXAM:  He is fully alert, cooperative, good eye contact,  casually dressed.  Speech is clear.  Patient's mood is neutral.  Patient's affect is flat.  Thought processes:  Endorsing homicidal  ideation, does not appear to be actively responding to any internal  stimuli.  Cognitive function intact, memory is good, judgment is poor,  insight is partial, somewhat of a vague historian.   PLAN:  To stabilize and have patient contract for safety.  We will  resume his medications.  We will continue with patient's prednisone  taper.  We will check respiratory status.  We will obtain pulse-ox's,  have  nebulizer treatments available, and put patient on O2.  Case  manager is to assess followup.  We will consider a family session with  his sons, and we will also resume patient's Paxil.  Patient is agreeable  to beginning Paxil again.   TENTATIVE LENGTH OF STAY:  Five to six days.      Landry Corporal, N.P.    ______________________________  Geoffery Lyons, M.D.    JO/MEDQ  D:  09/22/2006  T:  09/22/2006  Job:  [id number]

## 2010-12-20 NOTE — Discharge Summary (Signed)
[name], [name]:  [id number]   MEDICAL RECORD NO.:  [id number]          PATIENT TYPE:  IPS   LOCATION:  0500                          [facility]ACILITY[facility]  [facility]   PHYSICIAN:  Geoffery Lyons, M.D.      DATE OF BIRTH:  08-Oct-1940   DATE OF ADMISSION:  10/01/2008  DATE OF DISCHARGE:  10/06/2008                               DISCHARGE SUMMARY   CHIEF COMPLAINT/HISTORY OF PRESENT ILLNESS:  This was one of multiple  admissions to [facility] for this 70 year old male who  initially presented in the emergency room with some suicidal thoughts  that he might want to blow himself up with C-4 explosive.  He reported  increasing depression since his mother is dying in a local nursing home.  He says that she has been much worse.  Denies relapsing on alcohol.  He  has had several months of abstinence.  Stopped Paxil about a month prior  to this admission due to sexual side effects.  Has not kept the follow-  up appointment with [facility].  He has been taking the Seroquel that takes  as prescribed for PTSD.   PAST PSYCHIATRIC HISTORY:  History of multiple admissions to [facility].  Scheduled followup at [facility].  He also has  followup through the [place].  Diagnosed with PTSD and depression with  persistent flashbacks.   ALCOHOL AND DRUG HISTORY:  Persistent use of alcohol, binge drinking,  but reported months of abstinence.   MEDICAL HISTORY:  1. COPD.  2. Hypertension.  3. Degenerative disk disease.  4. History of myocardial infarction.  5. Diabetes mellitus type 2.   MEDICATIONS:  1. Metformin 500 mg twice a day.  2. Albuterol inhaler as needed for COPD.  3. Combivent inhaler.   PHYSICAL EXAMINATION:  Physical exam failed to show any acute findings.   LABORATORY WORK:  CBC:  White blood cells 11.3, hemoglobin 11.6.  Sodium  142, potassium 3.4, BUN 10, creatinine 0.84, glucose 104.   MENTAL STATUS EXAM:  Reveals alert cooperative  male.  Mood depressed.  Affect broad.  Thought processes logical, coherent and relevant.  Endorsed persistent depression dealing with what he anticipates to be  the imminent death of his mother.  No active delusions.  No  hallucinations.  Cognition well-preserved.   ADMISSION DIAGNOSES:  AXIS I: Major depressive disorder, post traumatic  stress disorder (PTSD), alcohol dependence in partial remission.  AXIS II: No diagnosis.  AXIS III:  Chronic obstructive pulmonary disease, hypertension, diabetes  mellitus type 2.  AXIS IV: Moderate.  AXIS V:  On admission 35.  Highest global assessment of functioning  (GAF) in the last year 60.   COURSE IN THE HOSPITAL:  He was admitted, started individual and group  psychotherapy.  He was placed back on Seroquel.  As already stated, 89-  year-old male veteran who says he went back to the same situation.  He  says he knows  what he needs to do and that he is the one responsible  for making the changes.  He wants to go  to a [facility] nursing [facility] but has not  done the necessary arrangements to do so.  Blames himself for his  situation, admit to increased signs and symptoms of depression.  Contrary to the initial assessment, he did endorse that he relapsed on  alcohol and was feeling out of control.  He came to the emergency room  admitting suicidal thoughts.   March 2 continued, he was having a hard time with sleep.  He was placed  on antibiotic that he felt was making him more hyperactive.  He had some  upper respiratory tract symptoms that were addressed by the nurse  practitioner.  We detoxed with Librium.  By March 5, he was in full  contact with reality.  Endorsed that he felt ready to go home.  Claimed  that this time around was going to make the necessary arrangements to be  seeking out placement.  Felt safe to go home so we went ahead and  discharged to outpatient followup.   DISCHARGE DIAGNOSES:  AXIS I: Major depressive disorder, post  traumatic  stress disorder (PTSD), alcohol dependence.  AXIS II: No diagnosis.  AXIS III:  Diabetes mellitus type 2, hypertension, chronic obstructive  pulmonary disease, diskogenic disease.  AXIS IV: Moderate.  AXIS V:  Upon discharge 55-60.   DISCHARGE MEDICATIONS:  1. Metformin 500 mg twice a day.  2. Seroquel 25 mg at bedtime.  3. Trazodone 50 mg at bedtime.   FOLLOWUP:  Follow up at [facility] and the [place].      Geoffery Lyons, M.D.  Electronically Signed     IL/MEDQ  D:  10/31/2008  T:  10/31/2008  Job:  [id number]

## 2010-12-20 NOTE — Discharge Summary (Signed)
NAMEJAYMIEN, [name]:  [id number]   MEDICAL RECORD NO.:  [id number]          PATIENT TYPE:  IPS   LOCATION:  0302                          FACILITY:  [facility]   PHYSICIAN:  Jasmine Pang, M.D. DATE OF BIRTH:  05-04-1941   DATE OF ADMISSION:  12/21/2007  DATE OF DISCHARGE:  12/25/2007                               DISCHARGE SUMMARY   IDENTIFICATION:  This is a 70 year old white male who was admitted on a  voluntary basis on Dec 21, 2007.   HISTORY OF PRESENT ILLNESS:  The patient presents with a history of  depression feeling very hopeless and reporting to others I do not care  if I live or die.  He has been unable to contract for safety.  He does  report having a beer yesterday.  He states he lost about 10-15 pounds  and has not been sleeping, sleeping only 2 hours of time.  He reports  difficulties with his medications being to obtain them due to  transportation, isolation, and financial issues.   PAST PSYCHIATRIC HISTORY:  The patient was in our facility on November 27, 2007, for alcohol abuse and depressed symptoms.  His follow up was  unclear was other time.   FAMILY HISTORY:  Father had a history of depression.   ALCOHOL AND DRUG HISTORY:  The patient again has history of some binge  drinking, but states that yesterday he had only 1 beer.  Denies any drug  use.   MEDICAL PROBLEMS:  COPD, degenerative disk disease, hypertension.   MEDICATIONS:  Fentanyl b.i.d., albuterol inhaler b.i.d., p.r.n.,  Lamictal 100 mg, questionable compliance.  He was unable to say what his  last medication was.  Hydrocodone p.r.n., albuterol nebulizer  treatments, Advair 250/50 mcg 1 inhalation b.i.d., oxygen 2.5 liters  p.r.n., Klonopin 0.5 mg p.o. t.i.d., Paxil 20 mg p.o. b.i.d.   DRUG ALLERGIES:  No known drug allergies.   PHYSICAL FINDINGS:  The patient was fully assessed at the [facility].  There were no acute or medical problems except for mild  respiratory distress.   LABORATORY DATA:  Alcohol level was 46 upon arrival to the emergency  room.  Urine drug screen was negative.  WBC count was elevated at 19.  EKG revealed sinus tachycardia.   HOSPITAL COURSE:  Upon admission, the patient was continued on O2 2.5  liters nasal cannula as needed.  He was also started on Librium 25 mg  p.o. q.6 h p.r.n., symptoms of withdrawal and Ambien 5 mg p.o. q.h.s.  p.r.n., insomnia, started on albuterol inhaler 2 puffs q.4-6 h p.r.n.,  and DuoNeb inhaler q.6 h p.r.n.  He was also started on Paxil 20 mg p.o.  b.i.d. and hydrocodone 5/325 mg 1 p.o. q.6 h p.r.n., pain.  In  individual sessions me, the patient was friendly and cooperative.  He  was reported being very depressed and anxious.  He did state he was  suicidal.  He states he gets angry at times.  There was no auditory or  visual hallucinations.  His stressors were revolved around his medical  problems and his 2 sons are isolated from him.  He has been off his  psychiatric medications for 1 week.  He denies using alcohol or drugs  my nerves are short.  As hospitalization progressed, he became less  depressed and less anxious.  However, his respiratory distress worsened.  He was worried about his emphysema and was afraid, he was developing  pneumonia.  On Dec 25, 2007, he continued to feel bad with respiratory  distress.  He was sent to the [facility] ED for evaluation of his  spiking temperature and vomiting.  They kept him in the hospital there  to treat his pneumonia.   DISCHARGE DIAGNOSES:  Axis I:  Major depressive disorder, recurrent  severe.  Posttraumatic stress disorder and alcohol abuse.  Axis II:  None.  Axis III:  Chronic obstructive pulmonary disease, degenerative disk  disease, recent onset of pneumonia.  Axis IV:  Psychosocial problems, medical problems, problems with access  to health care and receiving medications.  Axis V:  Global assessment of functioning was 50  upon discharge.  GAF  was 35 upon admission.  GAF highest past year was 65.   DISCHARGE PLANS:  The patient was transferred to [facility]  for admission due to pneumonia and worsening of his emphysema.  The  patient was discharged on same medications.  He was given during the  hospitalization, but he was not clear what medications they had him on  at [facility].      Jasmine Pang, M.D.  Electronically Signed     BHS/MEDQ  D:  01/27/2008  T:  01/27/2008  Job:  [id number]

## 2011-01-23 ENCOUNTER — Ambulatory Visit: Payer: Self-pay | Admitting: Gastroenterology

## 2011-04-29 LAB — DIFFERENTIAL
Basophils Absolute: 0
Basophils Absolute: 0.1
Basophils Relative: 1
Eosinophils Relative: 1
Eosinophils Relative: 1
Lymphocytes Relative: 20
Lymphocytes Relative: 13
Lymphocytes Relative: 14
Lymphs Abs: 2.5
Monocytes Absolute: 0.6
Monocytes Absolute: 0.9
Monocytes Absolute: 1.2 — ABNORMAL HIGH
Monocytes Relative: 7
Monocytes Relative: 8
Neutro Abs: 8.9 — ABNORMAL HIGH
Neutro Abs: 10.9 — ABNORMAL HIGH
Neutro Abs: 9.2 — ABNORMAL HIGH
Neutrophils Relative %: 78 — ABNORMAL HIGH

## 2011-04-29 LAB — RAPID URINE DRUG SCREEN, HOSP PERFORMED
Amphetamines: NOT DETECTED
Barbiturates: NOT DETECTED
Benzodiazepines: NOT DETECTED
Benzodiazepines: NOT DETECTED
Cocaine: NOT DETECTED
Tetrahydrocannabinol: NOT DETECTED

## 2011-04-29 LAB — BASIC METABOLIC PANEL
Calcium: 8.8
Creatinine, Ser: 0.71
GFR calc Af Amer: >60
GFR calc non Af Amer: >60
GFR calc non Af Amer: >60
Potassium: 4
Sodium: 134 — ABNORMAL LOW
Sodium: 133 — ABNORMAL LOW

## 2011-04-29 LAB — CBC
HCT: 39.5
HCT: 31.1 — ABNORMAL LOW
Hemoglobin: 14
Hemoglobin: 11 — ABNORMAL LOW
Hemoglobin: 12.1 — ABNORMAL LOW
MCHC: 35.3
Platelets: 192
RBC: 3.42 — ABNORMAL LOW
RBC: 3.73 — ABNORMAL LOW
RDW: 14.1
WBC: 12.3 — ABNORMAL HIGH
WBC: 14 — ABNORMAL HIGH

## 2011-04-29 LAB — URINALYSIS, ROUTINE W REFLEX MICROSCOPIC
Glucose, UA: NEGATIVE
Leukocytes, UA: NEGATIVE
Leukocytes, UA: NEGATIVE
Nitrite: NEGATIVE
Protein, ur: 30 — AB
Specific Gravity, Urine: 1.015
Specific Gravity, Urine: 1.02
Urobilinogen, UA: 1
pH: 5

## 2011-04-29 LAB — ETHANOL
Alcohol, Ethyl (B): <5
Alcohol, Ethyl (B): <5

## 2011-04-29 LAB — URINE MICROSCOPIC-ADD ON

## 2011-04-30 LAB — DIFFERENTIAL
Basophils Absolute: 0.1
Basophils Relative: 0
Eosinophils Absolute: 0.2
Eosinophils Relative: 1
Eosinophils Relative: 0
Lymphocytes Relative: 4 — ABNORMAL LOW
Lymphs Abs: 0.6 — ABNORMAL LOW
Monocytes Absolute: 0.9
Monocytes Absolute: 0.9

## 2011-04-30 LAB — HEPATIC FUNCTION PANEL
Alkaline Phosphatase: 93
Total Protein: 6.4

## 2011-04-30 LAB — CBC
HCT: 33.4 — ABNORMAL LOW
HCT: 34.4 — ABNORMAL LOW
HCT: 28 — ABNORMAL LOW
HCT: 28.2 — ABNORMAL LOW
HCT: 31 — ABNORMAL LOW
HCT: 31.2 — ABNORMAL LOW
Hemoglobin: 12.4 — ABNORMAL LOW
Hemoglobin: 10.7 — ABNORMAL LOW
Hemoglobin: 11 — ABNORMAL LOW
Hemoglobin: 9.6 — ABNORMAL LOW
MCHC: 36
MCV: 91.1
MCV: 90.5
MCV: 92.3
Platelets: 184
Platelets: 204
Platelets: 237
Platelets: 250
RBC: 3.65 — ABNORMAL LOW
RBC: 3.08 — ABNORMAL LOW
RBC: 3.35 — ABNORMAL LOW
RDW: 13.9
RDW: 13.7
RDW: 14.2
WBC: 12.4 — ABNORMAL HIGH
WBC: 13.2 — ABNORMAL HIGH
WBC: 13.9 — ABNORMAL HIGH
WBC: 16.8 — ABNORMAL HIGH

## 2011-04-30 LAB — BASIC METABOLIC PANEL
BUN: 7
BUN: 12
CO2: 25
Chloride: 111
Creatinine, Ser: 0.71
GFR calc Af Amer: >60
GFR calc Af Amer: >60
GFR calc non Af Amer: >60
GFR calc non Af Amer: >60
Glucose, Bld: 96
Potassium: 3.1 — ABNORMAL LOW
Potassium: 4
Potassium: 4.1
Sodium: 135
Sodium: 144

## 2011-04-30 LAB — RAPID URINE DRUG SCREEN, HOSP PERFORMED
Barbiturates: NOT DETECTED
Benzodiazepines: NOT DETECTED

## 2011-04-30 LAB — COMPREHENSIVE METABOLIC PANEL
AST: 14
BUN: 18
CO2: 26
Chloride: 106
Creatinine, Ser: 0.72
GFR calc non Af Amer: >60
Total Bilirubin: 0.5

## 2011-04-30 LAB — LIPID PANEL
HDL: 39 — ABNORMAL LOW
LDL Cholesterol: 86
Triglycerides: 93
VLDL: 19

## 2011-04-30 LAB — POCT CARDIAC MARKERS
CKMB, poc: 1.1
Myoglobin, poc: 46.6
Operator id: 284061
Troponin i, poc: <0.05
Troponin i, poc: <0.05

## 2011-04-30 LAB — POCT I-STAT, CHEM 8
BUN: 15
Chloride: 101
Sodium: 132 — ABNORMAL LOW

## 2011-04-30 LAB — CALCIUM: Calcium: 9

## 2011-04-30 LAB — MAGNESIUM
Magnesium: 2.2
Magnesium: 2.3

## 2011-05-02 LAB — COMPREHENSIVE METABOLIC PANEL
AST: 44 — ABNORMAL HIGH
BUN: 12
Calcium: 9.1
Creatinine, Ser: 0.68
GFR calc non Af Amer: >60
Glucose, Bld: 92
Potassium: 3.5
Sodium: 138
Total Bilirubin: 1

## 2011-05-02 LAB — DIFFERENTIAL
Basophils Absolute: 0.2 — ABNORMAL HIGH
Eosinophils Relative: 1
Lymphocytes Relative: 32
Lymphs Abs: 2.8
Neutro Abs: 5.3
Neutrophils Relative %: 62

## 2011-05-02 LAB — CBC
Hemoglobin: 12.2 — ABNORMAL LOW
Platelets: 167
RDW: 16.4 — ABNORMAL HIGH

## 2011-05-02 LAB — RAPID URINE DRUG SCREEN, HOSP PERFORMED
Barbiturates: NOT DETECTED
Benzodiazepines: NOT DETECTED

## 2011-05-02 LAB — POCT CARDIAC MARKERS
CKMB, poc: 3.1
Myoglobin, poc: 73.2
Operator id: 217151
Troponin i, poc: <0.05

## 2011-05-02 LAB — ETHANOL: Alcohol, Ethyl (B): 21 — ABNORMAL HIGH

## 2011-05-05 LAB — URINALYSIS, ROUTINE W REFLEX MICROSCOPIC
Bilirubin Urine: NEGATIVE
Bilirubin Urine: NEGATIVE
Hgb urine dipstick: NEGATIVE
Ketones, ur: NEGATIVE
Ketones, ur: NEGATIVE
Nitrite: NEGATIVE
Specific Gravity, Urine: <1.005 — ABNORMAL LOW
Urobilinogen, UA: 0.2
Urobilinogen, UA: 0.2

## 2011-05-05 LAB — BLOOD GAS, ARTERIAL
Bicarbonate: 24.4 — ABNORMAL HIGH
O2 Saturation: 92.9
Patient temperature: 37
TCO2: 22.2
pH, Arterial: 7.431

## 2011-05-05 LAB — GLUCOSE, CAPILLARY
Glucose-Capillary: 125 — ABNORMAL HIGH
Glucose-Capillary: 145 — ABNORMAL HIGH
Glucose-Capillary: 157 — ABNORMAL HIGH
Glucose-Capillary: 158 — ABNORMAL HIGH
Glucose-Capillary: 160 — ABNORMAL HIGH
Glucose-Capillary: 173 — ABNORMAL HIGH

## 2011-05-05 LAB — CBC
HCT: 32.9 — ABNORMAL LOW
HCT: 33.9 — ABNORMAL LOW
HCT: 38.5 — ABNORMAL LOW
MCHC: 34.6
MCHC: 34.8
MCHC: 35
MCV: 93.2
MCV: 93.4
MCV: 93.9
Platelets: 186
Platelets: 189
RBC: 4.13 — ABNORMAL LOW
RDW: 14.1
RDW: 14.1
WBC: 11.3 — ABNORMAL HIGH
WBC: 13 — ABNORMAL HIGH

## 2011-05-05 LAB — DIFFERENTIAL
Basophils Absolute: 0
Basophils Absolute: 0
Basophils Relative: 0
Basophils Relative: 0
Eosinophils Absolute: 0
Eosinophils Absolute: 0
Eosinophils Relative: 0
Eosinophils Relative: 0
Lymphocytes Relative: 12
Lymphs Abs: 0.9
Lymphs Abs: 1.4
Monocytes Absolute: 0.1
Monocytes Absolute: 0.3
Monocytes Relative: 2 — ABNORMAL LOW
Neutro Abs: 6
Neutrophils Relative %: 89 — ABNORMAL HIGH

## 2011-05-05 LAB — BASIC METABOLIC PANEL
BUN: 21
CO2: 27
Chloride: 102
Chloride: 106
Creatinine, Ser: 0.83
GFR calc Af Amer: >60
Glucose, Bld: 169 — ABNORMAL HIGH
Potassium: 3.2 — ABNORMAL LOW
Potassium: 3.6

## 2011-05-05 LAB — COMPREHENSIVE METABOLIC PANEL
Albumin: 3.3 — ABNORMAL LOW
BUN: 16
Calcium: 9.2
Chloride: 106
Creatinine, Ser: 0.76
Total Bilirubin: 0.5
Total Protein: 5.8 — ABNORMAL LOW

## 2011-05-05 LAB — URINE CULTURE
Colony Count: NO GROWTH
Special Requests: NEGATIVE

## 2011-05-05 LAB — RAPID URINE DRUG SCREEN, HOSP PERFORMED
Amphetamines: NOT DETECTED
Barbiturates: NOT DETECTED
Cocaine: NOT DETECTED
Opiates: NOT DETECTED
Tetrahydrocannabinol: NOT DETECTED

## 2011-05-05 LAB — APTT: aPTT: 26

## 2011-05-05 LAB — PROTIME-INR: INR: 1

## 2011-05-05 LAB — ETHANOL: Alcohol, Ethyl (B): <5

## 2011-05-06 LAB — URINALYSIS, ROUTINE W REFLEX MICROSCOPIC
Glucose, UA: NEGATIVE
Urobilinogen, UA: 0.2

## 2011-05-06 LAB — CBC
HCT: 33.2 — ABNORMAL LOW
Hemoglobin: 10.3 — ABNORMAL LOW
Hemoglobin: 10.8 — ABNORMAL LOW
MCHC: 34.8
MCV: 93.1
MCV: 94.2
MCV: 94.5
Platelets: 174
Platelets: 189
RBC: 3.11 — ABNORMAL LOW
RBC: 3.36 — ABNORMAL LOW
RBC: 3.53 — ABNORMAL LOW
WBC: 11.3 — ABNORMAL HIGH
WBC: 13.6 — ABNORMAL HIGH
WBC: 6
WBC: 8.5

## 2011-05-06 LAB — DIFFERENTIAL
Basophils Relative: 0
Eosinophils Absolute: 0.1
Lymphocytes Relative: 15
Lymphocytes Relative: 6 — ABNORMAL LOW
Lymphs Abs: 0.5 — ABNORMAL LOW
Lymphs Abs: 2
Lymphs Abs: 2.4
Monocytes Absolute: 0.7
Monocytes Relative: 2 — ABNORMAL LOW
Monocytes Relative: 6
Neutro Abs: 7.8 — ABNORMAL HIGH
Neutro Abs: 8 — ABNORMAL HIGH
Neutrophils Relative %: 71
Neutrophils Relative %: 79 — ABNORMAL HIGH
Neutrophils Relative %: 92 — ABNORMAL HIGH

## 2011-05-06 LAB — BASIC METABOLIC PANEL
BUN: 10
Calcium: 9.1
Calcium: 9.2
Calcium: 9.5
Chloride: 106
Creatinine, Ser: 0.69
Creatinine, Ser: 0.82
Creatinine, Ser: 0.83
GFR calc Af Amer: >60
GFR calc Af Amer: >60
GFR calc non Af Amer: >60
GFR calc non Af Amer: >60
Sodium: 140
Sodium: 141

## 2011-05-06 LAB — GLUCOSE, CAPILLARY
Glucose-Capillary: 126 — ABNORMAL HIGH
Glucose-Capillary: 139 — ABNORMAL HIGH
Glucose-Capillary: 139 — ABNORMAL HIGH
Glucose-Capillary: 142 — ABNORMAL HIGH
Glucose-Capillary: 145 — ABNORMAL HIGH
Glucose-Capillary: 171 — ABNORMAL HIGH
Glucose-Capillary: 171 — ABNORMAL HIGH
Glucose-Capillary: 185 — ABNORMAL HIGH
Glucose-Capillary: 200 — ABNORMAL HIGH
Glucose-Capillary: 229 — ABNORMAL HIGH
Glucose-Capillary: 285 — ABNORMAL HIGH
Glucose-Capillary: 78
Glucose-Capillary: 96

## 2011-05-06 LAB — CULTURE, BLOOD (ROUTINE X 2): Report Status: 11202009

## 2011-05-06 LAB — BLOOD GAS, ARTERIAL
Patient temperature: 37
pH, Arterial: 7.428

## 2011-05-06 LAB — RAPID URINE DRUG SCREEN, HOSP PERFORMED
Amphetamines: NOT DETECTED
Benzodiazepines: NOT DETECTED
Cocaine: NOT DETECTED

## 2011-05-06 LAB — COMPREHENSIVE METABOLIC PANEL
AST: 20
Albumin: 3.3 — ABNORMAL LOW
Alkaline Phosphatase: 66
CO2: 25
Chloride: 105
Creatinine, Ser: 0.8
GFR calc Af Amer: >60
GFR calc non Af Amer: >60
Potassium: 3.8
Total Bilirubin: 0.4

## 2011-05-06 LAB — POCT CARDIAC MARKERS
CKMB, poc: <1 — ABNORMAL LOW
Troponin i, poc: <0.05

## 2011-05-06 LAB — URINE MICROSCOPIC-ADD ON

## 2011-05-06 LAB — B-NATRIURETIC PEPTIDE (CONVERTED LAB): Pro B Natriuretic peptide (BNP): <30

## 2011-05-09 LAB — DIFFERENTIAL
Basophils Absolute: 0.1 10*3/uL (ref 0.0–0.1)
Eosinophils Relative: 1 % (ref 0–5)
Lymphocytes Relative: 29 % (ref 12–46)
Monocytes Absolute: 0.8 10*3/uL (ref 0.1–1.0)

## 2011-05-09 LAB — BASIC METABOLIC PANEL
CO2: 26 mEq/L (ref 19–32)
Chloride: 106 mEq/L (ref 96–112)
GFR calc non Af Amer: >60 mL/min (ref >60)
Glucose, Bld: 101 mg/dL — ABNORMAL HIGH (ref 70–99)
Potassium: 3.7 mEq/L (ref 3.5–5.1)
Sodium: 141 mEq/L (ref 135–145)

## 2011-05-09 LAB — CBC
HCT: 33.6 % — ABNORMAL LOW (ref 39.0–52.0)
Hemoglobin: 11.4 g/dL — ABNORMAL LOW (ref 13.0–17.0)
MCHC: 33.9 g/dL (ref 30.0–36.0)
RDW: 14.4 % (ref 11.5–15.5)

## 2011-05-09 LAB — BLOOD GAS, ARTERIAL
Acid-Base Excess: 1.4 mmol/L (ref 0.0–2.0)
TCO2: 22 mmol/L (ref 0–100)
pCO2 arterial: 39.6 mmHg (ref 35.0–45.0)

## 2011-05-13 LAB — URINE MICROSCOPIC-ADD ON

## 2011-05-13 LAB — DIFFERENTIAL
Basophils Relative: 0
Basophils Relative: 0
Basophils Relative: 2 — ABNORMAL HIGH
Eosinophils Absolute: 0
Eosinophils Absolute: 0.3
Eosinophils Absolute: 0.4
Lymphocytes Relative: 12
Lymphs Abs: 0.6 — ABNORMAL LOW
Lymphs Abs: 0.7
Monocytes Absolute: 0 — ABNORMAL LOW
Monocytes Absolute: 0.7
Monocytes Relative: 1 — ABNORMAL LOW
Monocytes Relative: 7
Neutro Abs: 4.1
Neutrophils Relative %: 57
Neutrophils Relative %: 89 — ABNORMAL HIGH

## 2011-05-13 LAB — CBC
HCT: 34.4 — ABNORMAL LOW
HCT: 34.4 — ABNORMAL LOW
HCT: 38 — ABNORMAL LOW
Hemoglobin: 11.7 — ABNORMAL LOW
Hemoglobin: 11.8 — ABNORMAL LOW
Hemoglobin: 12.8 — ABNORMAL LOW
MCHC: 33.8
MCHC: 34
MCHC: 34.6
MCV: 90.1
MCV: 91.2
MCV: 91.6
Platelets: 272
RBC: 3.77 — ABNORMAL LOW
RBC: 3.81 — ABNORMAL LOW
RBC: 4.15 — ABNORMAL LOW
RDW: 14

## 2011-05-13 LAB — BLOOD GAS, ARTERIAL
Bicarbonate: 17.9 — ABNORMAL LOW
O2 Saturation: 95.9
Patient temperature: 37
TCO2: 16.5
pO2, Arterial: 85.7

## 2011-05-13 LAB — BASIC METABOLIC PANEL
BUN: 8
CO2: 25
CO2: 29
Chloride: 108
Chloride: 115 — ABNORMAL HIGH
Creatinine, Ser: 0.63
Creatinine, Ser: 0.67
GFR calc Af Amer: >60
Potassium: 3.3 — ABNORMAL LOW
Sodium: 147 — ABNORMAL HIGH

## 2011-05-13 LAB — COMPREHENSIVE METABOLIC PANEL
ALT: 12
BUN: 9
CO2: 22
Calcium: 9.3
GFR calc non Af Amer: >60
Glucose, Bld: 174 — ABNORMAL HIGH
Sodium: 144

## 2011-05-13 LAB — URINALYSIS, ROUTINE W REFLEX MICROSCOPIC
Bilirubin Urine: NEGATIVE
Nitrite: NEGATIVE
Specific Gravity, Urine: <1.005 — ABNORMAL LOW
pH: 6

## 2011-05-13 LAB — RAPID URINE DRUG SCREEN, HOSP PERFORMED
Barbiturates: NOT DETECTED
Opiates: NOT DETECTED

## 2011-05-13 LAB — PROTIME-INR
INR: 1.1
Prothrombin Time: 14.3

## 2011-05-13 LAB — ETHANOL: Alcohol, Ethyl (B): 151 — ABNORMAL HIGH

## 2011-06-30 ENCOUNTER — Encounter (HOSPITAL_COMMUNITY): Payer: Self-pay | Admitting: *Deleted

## 2011-06-30 ENCOUNTER — Inpatient Hospital Stay (HOSPITAL_COMMUNITY): Payer: Medicare Other

## 2011-06-30 ENCOUNTER — Emergency Department (HOSPITAL_COMMUNITY): Payer: Medicare Other

## 2011-06-30 ENCOUNTER — Inpatient Hospital Stay (HOSPITAL_COMMUNITY)
Admission: EM | Admit: 2011-06-30 | Discharge: 2011-07-04 | DRG: 191 | Disposition: A | Discharge status: Home / Self Care | Payer: Medicare Other | Attending: Internal Medicine | Admitting: Internal Medicine

## 2011-06-30 DIAGNOSIS — M1990 Unspecified osteoarthritis, unspecified site: Secondary | ICD-10-CM | POA: Diagnosis present

## 2011-06-30 DIAGNOSIS — F3289 Other specified depressive episodes: Secondary | ICD-10-CM | POA: Diagnosis present

## 2011-06-30 DIAGNOSIS — J441 Chronic obstructive pulmonary disease with (acute) exacerbation: Principal | ICD-10-CM | POA: Diagnosis present

## 2011-06-30 DIAGNOSIS — I10 Essential (primary) hypertension: Secondary | ICD-10-CM | POA: Diagnosis present

## 2011-06-30 DIAGNOSIS — IMO0002 Reserved for concepts with insufficient information to code with codable children: Secondary | ICD-10-CM | POA: Diagnosis present

## 2011-06-30 DIAGNOSIS — R1310 Dysphagia, unspecified: Secondary | ICD-10-CM | POA: Diagnosis present

## 2011-06-30 DIAGNOSIS — F17200 Nicotine dependence, unspecified, uncomplicated: Secondary | ICD-10-CM | POA: Diagnosis present

## 2011-06-30 DIAGNOSIS — R45851 Suicidal ideations: Secondary | ICD-10-CM

## 2011-06-30 DIAGNOSIS — Z9981 Dependence on supplemental oxygen: Secondary | ICD-10-CM

## 2011-06-30 DIAGNOSIS — D649 Anemia, unspecified: Secondary | ICD-10-CM | POA: Diagnosis present

## 2011-06-30 DIAGNOSIS — J449 Chronic obstructive pulmonary disease, unspecified: Secondary | ICD-10-CM | POA: Diagnosis present

## 2011-06-30 DIAGNOSIS — K224 Dyskinesia of esophagus: Secondary | ICD-10-CM | POA: Diagnosis present

## 2011-06-30 DIAGNOSIS — F419 Anxiety disorder, unspecified: Secondary | ICD-10-CM | POA: Diagnosis present

## 2011-06-30 DIAGNOSIS — F329 Major depressive disorder, single episode, unspecified: Secondary | ICD-10-CM | POA: Diagnosis present

## 2011-06-30 DIAGNOSIS — F411 Generalized anxiety disorder: Secondary | ICD-10-CM | POA: Diagnosis present

## 2011-06-30 DIAGNOSIS — E119 Type 2 diabetes mellitus without complications: Secondary | ICD-10-CM | POA: Diagnosis present

## 2011-06-30 DIAGNOSIS — E871 Hypo-osmolality and hyponatremia: Secondary | ICD-10-CM | POA: Diagnosis present

## 2011-06-30 DIAGNOSIS — F32A Depression, unspecified: Secondary | ICD-10-CM | POA: Diagnosis present

## 2011-06-30 DIAGNOSIS — N400 Enlarged prostate without lower urinary tract symptoms: Secondary | ICD-10-CM | POA: Diagnosis present

## 2011-06-30 HISTORY — DX: Dyskinesia of esophagus: K22.4

## 2011-06-30 LAB — CBC
HCT: 31 % — ABNORMAL LOW (ref 39.0–52.0)
Hemoglobin: 10.8 g/dL — ABNORMAL LOW (ref 13.0–17.0)
MCH: 31.5 pg (ref 26.0–34.0)
MCV: 90.4 fL (ref 78.0–100.0)
Platelets: 287 10*3/uL (ref 150–400)
RBC: 3.43 MIL/uL — ABNORMAL LOW (ref 4.22–5.81)

## 2011-06-30 LAB — ETHANOL: Alcohol, Ethyl (B): <11 mg/dL (ref 0–11)

## 2011-06-30 LAB — URINALYSIS, ROUTINE W REFLEX MICROSCOPIC
Glucose, UA: NEGATIVE mg/dL
Ketones, ur: NEGATIVE mg/dL
Protein, ur: NEGATIVE mg/dL
pH: 5.5 (ref 5.0–8.0)

## 2011-06-30 LAB — DIFFERENTIAL
Eosinophils Absolute: 0.3 10*3/uL (ref 0.0–0.7)
Eosinophils Relative: 4 % (ref 0–5)
Lymphocytes Relative: 31 % (ref 12–46)
Lymphs Abs: 2.5 10*3/uL (ref 0.7–4.0)
Monocytes Absolute: 0.6 10*3/uL (ref 0.1–1.0)
Monocytes Relative: 8 % (ref 3–12)

## 2011-06-30 LAB — CARDIAC PANEL(CRET KIN+CKTOT+MB+TROPI): Relative Index: INVALID (ref 0.0–2.5)

## 2011-06-30 LAB — COMPREHENSIVE METABOLIC PANEL
AST: 15 U/L (ref 0–37)
Albumin: 3.6 g/dL (ref 3.5–5.2)
Chloride: 92 mEq/L — ABNORMAL LOW (ref 96–112)
Creatinine, Ser: 1.01 mg/dL (ref 0.50–1.35)
Total Bilirubin: 0.1 mg/dL — ABNORMAL LOW (ref 0.3–1.2)
Total Protein: 7.1 g/dL (ref 6.0–8.3)

## 2011-06-30 LAB — RAPID URINE DRUG SCREEN, HOSP PERFORMED
Amphetamines: NOT DETECTED
Benzodiazepines: NOT DETECTED
Cocaine: NOT DETECTED
Opiates: NOT DETECTED

## 2011-06-30 LAB — URINE MICROSCOPIC-ADD ON

## 2011-06-30 MED ORDER — SIMVASTATIN 20 MG PO TABS
40.0000 mg | ORAL_TABLET | Freq: Every day | ORAL | Status: DC
  Administered 2011-07-01 – 2011-07-03 (×3): 40 mg via ORAL
  Filled 2011-06-30 (×3): qty 2

## 2011-06-30 MED ORDER — LISINOPRIL 10 MG PO TABS
40.0000 mg | ORAL_TABLET | Freq: Every day | ORAL | Status: DC
  Administered 2011-07-01 – 2011-07-04 (×4): 40 mg via ORAL
  Filled 2011-06-30 (×4): qty 4

## 2011-06-30 MED ORDER — ACETAMINOPHEN 650 MG RE SUPP: 650.0000 mg | Freq: Four times a day (QID) | RECTAL | Status: DC | PRN

## 2011-06-30 MED ORDER — BUDESONIDE-FORMOTEROL FUMARATE 160-4.5 MCG/ACT IN AERO
2.0000 | INHALATION_SPRAY | Freq: Two times a day (BID) | RESPIRATORY_TRACT | Status: DC
  Administered 2011-07-01 – 2011-07-04 (×8): 2 via RESPIRATORY_TRACT
  Filled 2011-06-30: qty 6

## 2011-06-30 MED ORDER — INSULIN ASPART 100 UNIT/ML ~~LOC~~ SOLN
0.0000 [IU] | Freq: Three times a day (TID) | SUBCUTANEOUS | Status: DC
  Administered 2011-07-01: 4 [IU] via SUBCUTANEOUS
  Administered 2011-07-01: 7 [IU] via SUBCUTANEOUS
  Administered 2011-07-02: 3 [IU] via SUBCUTANEOUS
  Filled 2011-06-30: qty 3

## 2011-06-30 MED ORDER — ALBUTEROL (5 MG/ML) CONTINUOUS INHALATION SOLN
10.0000 mg/h | INHALATION_SOLUTION | Freq: Once | RESPIRATORY_TRACT | Status: AC
  Administered 2011-06-30: 10 mg/h via RESPIRATORY_TRACT
  Filled 2011-06-30: qty 20

## 2011-06-30 MED ORDER — SODIUM CHLORIDE 0.9 % IV SOLN
INTRAVENOUS | Status: DC
  Administered 2011-07-01: 02:00:00 via INTRAVENOUS

## 2011-06-30 MED ORDER — ONDANSETRON HCL 4 MG PO TABS: 4.0000 mg | ORAL_TABLET | Freq: Four times a day (QID) | ORAL | Status: DC | PRN

## 2011-06-30 MED ORDER — MELOXICAM 7.5 MG PO TABS
15.0000 mg | ORAL_TABLET | Freq: Every day | ORAL | Status: DC
Start: 2011-07-01 — End: 2011-07-04
  Administered 2011-07-01 – 2011-07-04 (×4): 15 mg via ORAL
  Filled 2011-06-30: qty 2
  Filled 2011-06-30: qty 1
  Filled 2011-06-30: qty 2
  Filled 2011-06-30: qty 1
  Filled 2011-06-30 (×3): qty 2

## 2011-06-30 MED ORDER — DEXTROSE 5 % IV SOLN
1.0000 g | INTRAVENOUS | Status: DC
  Administered 2011-06-30: 1 g via INTRAVENOUS
  Filled 2011-06-30 (×2): qty 10

## 2011-06-30 MED ORDER — ROFLUMILAST 500 MCG PO TABS
500.0000 ug | ORAL_TABLET | Freq: Every day | ORAL | Status: DC
  Administered 2011-07-01 – 2011-07-04 (×4): 500 ug via ORAL
  Filled 2011-06-30 (×7): qty 1

## 2011-06-30 MED ORDER — SODIUM CHLORIDE 0.9 % IV SOLN
Freq: Once | INTRAVENOUS | Status: AC
  Administered 2011-06-30: 17:00:00 via INTRAVENOUS

## 2011-06-30 MED ORDER — INSULIN ASPART 100 UNIT/ML ~~LOC~~ SOLN: 0.0000 [IU] | Freq: Every day | SUBCUTANEOUS | Status: DC

## 2011-06-30 MED ORDER — PAROXETINE HCL 20 MG PO TABS
40.0000 mg | ORAL_TABLET | Freq: Every day | ORAL | Status: DC
  Administered 2011-07-01: 40 mg via ORAL
  Filled 2011-06-30: qty 2

## 2011-06-30 MED ORDER — ALBUTEROL SULFATE (5 MG/ML) 0.5% IN NEBU: 2.5000 mg | INHALATION_SOLUTION | RESPIRATORY_TRACT | Status: DC | PRN

## 2011-06-30 MED ORDER — METHYLPREDNISOLONE SODIUM SUCC 125 MG IJ SOLR
80.0000 mg | Freq: Four times a day (QID) | INTRAMUSCULAR | Status: DC
  Administered 2011-07-01 (×4): 80 mg via INTRAVENOUS
  Filled 2011-06-30 (×2): qty 2

## 2011-06-30 MED ORDER — DEXTROSE 5 % IV SOLN
500.0000 mg | INTRAVENOUS | Status: DC
  Administered 2011-06-30 – 2011-07-01 (×2): 500 mg via INTRAVENOUS
  Filled 2011-06-30 (×2): qty 500

## 2011-06-30 MED ORDER — SENNOSIDES-DOCUSATE SODIUM 8.6-50 MG PO TABS: 1.0000 | ORAL_TABLET | Freq: Every day | ORAL | Status: DC | PRN

## 2011-06-30 MED ORDER — METHYLPREDNISOLONE SODIUM SUCC 125 MG IJ SOLR
125.0000 mg | Freq: Once | INTRAMUSCULAR | Status: AC
  Administered 2011-06-30: 125 mg via INTRAVENOUS
  Filled 2011-06-30: qty 2

## 2011-06-30 MED ORDER — ENOXAPARIN SODIUM 40 MG/0.4ML ~~LOC~~ SOLN
40.0000 mg | Freq: Every day | SUBCUTANEOUS | Status: DC
  Administered 2011-07-01 – 2011-07-03 (×4): 40 mg via SUBCUTANEOUS
  Filled 2011-06-30 (×3): qty 0.4

## 2011-06-30 MED ORDER — SENNA 8.6 MG PO TABS: 2.0000 | ORAL_TABLET | Freq: Every day | ORAL | Status: DC | PRN

## 2011-06-30 MED ORDER — THERA M PLUS PO TABS
1.0000 | ORAL_TABLET | Freq: Every day | ORAL | Status: DC
  Administered 2011-07-01 – 2011-07-04 (×4): 1 via ORAL
  Filled 2011-06-30 (×4): qty 1

## 2011-06-30 MED ORDER — MORPHINE SULFATE 4 MG/ML IJ SOLN
4.0000 mg | Freq: Once | INTRAMUSCULAR | Status: AC
  Administered 2011-06-30: 4 mg via INTRAVENOUS
  Filled 2011-06-30: qty 1

## 2011-06-30 MED ORDER — ALBUTEROL (5 MG/ML) CONTINUOUS INHALATION SOLN
INHALATION_SOLUTION | RESPIRATORY_TRACT | Status: AC
  Filled 2011-06-30: qty 20

## 2011-06-30 MED ORDER — ALBUTEROL SULFATE (5 MG/ML) 0.5% IN NEBU
2.5000 mg | INHALATION_SOLUTION | Freq: Four times a day (QID) | RESPIRATORY_TRACT | Status: DC
  Administered 2011-07-01 – 2011-07-04 (×12): 2.5 mg via RESPIRATORY_TRACT
  Filled 2011-06-30 (×11): qty 0.5

## 2011-06-30 MED ORDER — BUSPIRONE HCL 5 MG PO TABS
15.0000 mg | ORAL_TABLET | Freq: Two times a day (BID) | ORAL | Status: DC
  Administered 2011-07-01 (×2): 15 mg via ORAL
  Filled 2011-06-30: qty 3

## 2011-06-30 MED ORDER — ONDANSETRON HCL 4 MG/2ML IJ SOLN: 4.0000 mg | Freq: Four times a day (QID) | INTRAMUSCULAR | Status: DC | PRN

## 2011-06-30 MED ORDER — TIOTROPIUM BROMIDE MONOHYDRATE 18 MCG IN CAPS
18.0000 ug | ORAL_CAPSULE | Freq: Every day | RESPIRATORY_TRACT | Status: DC
  Administered 2011-07-01 – 2011-07-04 (×5): 18 ug via RESPIRATORY_TRACT
  Filled 2011-06-30: qty 5

## 2011-06-30 MED ORDER — ACETAMINOPHEN 325 MG PO TABS
650.0000 mg | ORAL_TABLET | Freq: Four times a day (QID) | ORAL | Status: DC | PRN
  Administered 2011-07-01 – 2011-07-04 (×3): 650 mg via ORAL
  Filled 2011-06-30 (×3): qty 2

## 2011-06-30 MED ORDER — DOCUSATE SODIUM 100 MG PO CAPS
100.0000 mg | ORAL_CAPSULE | Freq: Two times a day (BID) | ORAL | Status: DC
  Administered 2011-07-01 – 2011-07-04 (×8): 100 mg via ORAL
  Filled 2011-06-30 (×7): qty 1

## 2011-06-30 MED ORDER — OXYCODONE HCL 5 MG PO TABS
5.0000 mg | ORAL_TABLET | Freq: Four times a day (QID) | ORAL | Status: DC | PRN
  Administered 2011-07-01 – 2011-07-04 (×6): 5 mg via ORAL
  Filled 2011-06-30 (×6): qty 1

## 2011-06-30 MED ORDER — ALPRAZOLAM 0.25 MG PO TABS: 0.2500 mg | ORAL_TABLET | Freq: Three times a day (TID) | ORAL | Status: DC | PRN

## 2011-06-30 NOTE — ED Notes (Signed)
While asking pt suicide screening questions, Pt stated that he was thinking of harming himself earlier today. Pt states " I am sick and tired of being sick and tired." Pts states plan would be to use a gun. When asked pt if he owned a gun, he stated " I refuse to answer that question because I could be locked up if I said yes" Then pt later stated he did not own a gun. Charge nurse notified. EMD notified. Sitter to bedside. Pt told EMD that he does not have a plan. EMD to have ACT assess pt.

## 2011-06-30 NOTE — ED Notes (Signed)
Called [phone] to give report, unable to do so at this time.

## 2011-06-30 NOTE — ED Notes (Signed)
Pt states productive cough, tan in color, chest pain (mostly to right chest) and SOB x 3 days. Pt had albuterol tx x 2 today at home with minimal relief ("only for about five minutes, per pt). NAD upon arrival.

## 2011-06-30 NOTE — H&P (Signed)
[name] is an 70 y.o. male.    PCP: [facility]  Chief Complaint: Shortness of breath, and suicidal ideation  HPI: This is a 69 year old, Caucasian male, with past with history of COPD, diabetes, hypertension, DDD, who was in his usual state of health till Friday when he started getting short of breath. Started having a cough with yellowish expectoration. Did notice some blood in the sputum as well. Has experienced profound weakness. Has been having chest pain on the right side, which is pleuritic in nature. Currently, 10 out of 10 in intensity. The chest pain worsens with cough. Denies any sick contacts. Has had fever and chills. However, did not check his temperature. Has been having nausea, with some vomiting. He, says he was diagnosed with COPD about few years ago. Continues to smoke cigarettes. Symptoms continued to get worse and so, he decided to come in to the hospital.  He tells me that for the last few days he's had thoughts about taking his own life. He also has found himself talking to people who were not in the room. This has concerned him immensely. He said he's had depression and suicidal ideations in the past and has required admission to behavioral health.  He also mentions, difficulty with swallowing food and he, says that the food gets stuck in his mid chest area. He tells me he has never had endoscopy in the past.   Prior to Admission medications   Medication Sig Start Date End Date Taking? Authorizing Provider  albuterol (PROVENTIL HFA) 108 (90 BASE) MCG/ACT inhaler Inhale 2 puffs into the lungs 4 (four) times daily as needed. For COPD    Yes Historical Provider, MD  ALPRAZolam (XANAX) 0.25 MG tablet Take 0.25 mg by mouth 3 (three) times daily as needed.    Yes Historical Provider, MD  busPIRone (BUSPAR) 15 MG tablet Take 15 mg by mouth every 12 (twelve) hours as needed. For anxiety    Yes Historical Provider, MD  lisinopril (PRINIVIL,ZESTRIL) 40  MG tablet Take 40 mg by mouth daily.     Yes Historical Provider, MD  metFORMIN (GLUCOPHAGE) 500 MG tablet Take 1,000 mg by mouth 2 (two) times daily with a meal.    Yes Historical Provider, MD  oxycodone (OXY-IR) 5 MG capsule Take 5 mg by mouth every 8 (eight) hours.     Yes Historical Provider, MD  roflumilast (DALIRESP) 500 MCG TABS tablet Take 500 mcg by mouth daily.     Yes Historical Provider, MD  sildenafil (VIAGRA) 50 MG tablet Take 50 mg by mouth daily as needed. For intercourse    Yes Historical Provider, MD  tiotropium (SPIRIVA) 18 MCG inhalation capsule Place 18 mcg into inhaler and inhale daily.     Yes Historical Provider, MD  traMADol (ULTRAM) 50 MG tablet Take 100 mg by mouth 3 (three) times daily as needed.    Yes Historical Provider, MD  meloxicam (MOBIC) 15 MG tablet Take 15 mg by mouth daily.      Historical Provider, MD  PARoxetine (PAXIL) 40 MG tablet Take 40 mg by mouth daily.      Historical Provider, MD  pravastatin (PRAVACHOL) 80 MG tablet Take 80 mg by mouth daily.      Historical Provider, MD  sodium chloride (ALTAMIST SPRAY) 0.65 % nasal spray Place 1 spray into the nose as needed. For nasal congestion     Historical Provider, MD    Allergies: No Known Allergies  Past Medical History  Diagnosis Date  . HTN (hypertension)   . Alcohol abuse   . PTSD (post-traumatic stress disorder)   . Chronic back pain   . Depression   . Peripheral neuropathy   . DJD (degenerative joint disease)   . COPD (chronic obstructive pulmonary disease)   . Anemia   . Myocardial infarction   . Type II or unspecified type diabetes mellitus without mention of complication, not stated as uncontrolled     Past Surgical History  Procedure Date  . Hernia repair     X 3  . Spinal surgeries     X 4  . Appendectomy   . Prostate surgery     Social History:  reports that he has been smoking.  He does not have any smokeless tobacco history on file. He reports that he drinks alcohol. He  reports that he does not use illicit drugs.  Family History:  Family History  Problem Relation Age of Onset  . Lung disease Mother   . Prostate cancer Father     Review of Systems - History obtained from the patient General ROS: positive for  - malaise Psychological ROS: positive for - suicidal ideation Allergy and Immunology ROS: negative Hematological and Lymphatic ROS: negative Endocrine ROS: negative Respiratory ROS: As in HPI Cardiovascular ROS: positive for - chest pain Gastrointestinal ROS: no abdominal pain, change in bowel habits, or black or bloody stools Genito-Urinary ROS: no dysuria, trouble voiding, or hematuria Musculoskeletal ROS: negative Neurological ROS: negative Dermatological ROS: negative  Physical Examination Blood pressure 146/63, pulse 87, temperature 98.1 F (36.7 C), temperature source Oral, resp. rate 18, height 5\' 3"  (1.6 m), weight 63.504 kg (140 lb), SpO2 94.00%.  General appearance: alert, cooperative and no distress Head: Normocephalic, without obvious abnormality, atraumatic Eyes: conjunctivae/corneas clear. PERRL, EOM's intact. Fundi benign. Nose: Nares normal. Septum midline. Mucosa normal. No drainage or sinus tenderness. Throat: lips, mucosa, and tongue normal; teeth and gums normal Neck: no adenopathy, no carotid bruit, no JVD, supple, symmetrical, trachea midline and thyroid not enlarged, symmetric, no tenderness/mass/nodules Resp: diminished breath sounds bibasilar and wheezes bilaterally Cardio: regular rate and rhythm, S1, S2 normal, no murmur, click, rub or gallop GI: soft, non-tender; bowel sounds normal; no masses,  no organomegaly and ventral hernia noted Extremities: extremities normal, atraumatic, no cyanosis or edema Pulses: 2+ and symmetric Skin: Skin color, texture, turgor normal. No rashes or lesions Lymph nodes: Cervical, supraclavicular, and axillary nodes normal. Neurologic: Alert and oriented X 3, normal strength and  tone. Normal symmetric reflexes. Normal coordination and gait No rashes  Results for orders placed during the hospital encounter of 06/30/11 (from the past 48 hour(s))  COMPREHENSIVE METABOLIC PANEL     Status: Abnormal   Collection Time   06/30/11  4:36 PM      Component Value Range Comment   Sodium 127 (*) 135 - 145 (mEq/L)    Potassium 4.3  3.5 - 5.1 (mEq/L)    Chloride 92 (*) 96 - 112 (mEq/L)    CO2 25  19 - 32 (mEq/L)    Glucose, Bld 106 (*) 70 - 99 (mg/dL)    BUN 13  6 - 23 (mg/dL)    Creatinine, Ser 0.45  0.50 - 1.35 (mg/dL)    Calcium 9.6  8.4 - 10.5 (mg/dL)    Total Protein 7.1  6.0 - 8.3 (g/dL)    Albumin 3.6  3.5 - 5.2 (g/dL)    AST 15  0 - 37 (U/L)  ALT 12  0 - 53 (U/L)    Alkaline Phosphatase 88  39 - 117 (U/L)    Total Bilirubin 0.1 (*) 0.3 - 1.2 (mg/dL)    GFR calc non Af Amer 74 (*) >90 (mL/min)    GFR calc Af Amer 86 (*) >90 (mL/min)   CBC     Status: Abnormal   Collection Time   06/30/11  4:36 PM      Component Value Range Comment   WBC 7.9  4.0 - 10.5 (K/uL)    RBC 3.43 (*) 4.22 - 5.81 (MIL/uL)    Hemoglobin 10.8 (*) 13.0 - 17.0 (g/dL)    HCT 40.9 (*) 81.1 - 52.0 (%)    MCV 90.4  78.0 - 100.0 (fL)    MCH 31.5  26.0 - 34.0 (pg)    MCHC 34.8  30.0 - 36.0 (g/dL)    RDW 91.4  78.2 - 95.6 (%)    Platelets 287  150 - 400 (K/uL)   DIFFERENTIAL     Status: Normal   Collection Time   06/30/11  4:36 PM      Component Value Range Comment   Neutrophils Relative 57  43 - 77 (%)    Neutro Abs 4.5  1.7 - 7.7 (K/uL)    Lymphocytes Relative 31  12 - 46 (%)    Lymphs Abs 2.5  0.7 - 4.0 (K/uL)    Monocytes Relative 8  3 - 12 (%)    Monocytes Absolute 0.6  0.1 - 1.0 (K/uL)    Eosinophils Relative 4  0 - 5 (%)    Eosinophils Absolute 0.3  0.0 - 0.7 (K/uL)    Basophils Relative 0  0 - 1 (%)    Basophils Absolute 0.0  0.0 - 0.1 (K/uL)   CARDIAC PANEL(CRET KIN+CKTOT+MB+TROPI)     Status: Normal   Collection Time   06/30/11  4:36 PM      Component Value Range Comment    Total CK 75  7 - 232 (U/L)    CK, MB 2.5  0.3 - 4.0 (ng/mL)    Troponin I <0.30  <0.30 (ng/mL)    Relative Index RELATIVE INDEX IS INVALID  0.0 - 2.5    ETHANOL     Status: Normal   Collection Time   06/30/11  4:36 PM      Component Value Range Comment   Alcohol, Ethyl (B) <11  0 - 11 (mg/dL)   URINE RAPID DRUG SCREEN (HOSP PERFORMED)     Status: Normal   Collection Time   06/30/11  7:45 PM      Component Value Range Comment   Opiates NONE DETECTED  NONE DETECTED     Cocaine NONE DETECTED  NONE DETECTED     Benzodiazepines NONE DETECTED  NONE DETECTED     Amphetamines NONE DETECTED  NONE DETECTED     Tetrahydrocannabinol NONE DETECTED  NONE DETECTED     Barbiturates NONE DETECTED  NONE DETECTED    URINALYSIS, ROUTINE W REFLEX MICROSCOPIC     Status: Abnormal   Collection Time   06/30/11  7:45 PM      Component Value Range Comment   Color, Urine YELLOW  YELLOW     Appearance CLEAR  CLEAR     Specific Gravity, Urine 1.010  1.005 - 1.030     pH 5.5  5.0 - 8.0     Glucose, UA NEGATIVE  NEGATIVE (mg/dL)    Hgb urine dipstick TRACE (*) NEGATIVE  Bilirubin Urine NEGATIVE  NEGATIVE     Ketones, ur NEGATIVE  NEGATIVE (mg/dL)    Protein, ur NEGATIVE  NEGATIVE (mg/dL)    Urobilinogen, UA 0.2  0.0 - 1.0 (mg/dL)    Nitrite NEGATIVE  NEGATIVE     Leukocytes, UA NEGATIVE  NEGATIVE    URINE MICROSCOPIC-ADD ON     Status: Normal   Collection Time   06/30/11  7:45 PM      Component Value Range Comment   Squamous Epithelial / LPF RARE  RARE     RBC / HPF 0-2  <3 (RBC/hpf)    Dg Chest 2 View  06/30/2011  *RADIOLOGY REPORT*  Clinical Data: Chest pain.  CHEST - 2 VIEW  Comparison: 04/23/2009  Findings: Heart size is normal.  The lungs are free of focal consolidations and pleural effusions.  No edema.  There is minimal left lower lobe atelectasis.  Degenerative changes are seen in the spine.  IMPRESSION:  1.  Minimal left lower lobe atelectasis. 2. No evidence for acute cardiopulmonary  abnormality.  Original Report Authenticated By: Patterson Hammersmith, M.D.   EKG shows a sinus tachycardia at 105. Normal axis. Intervals appear to be in the normal range. Possible Q waves in leads V 1 2 and 3. No concerning ST segment changes or T-wave changes are noted. No older EKG available for comparison.  Assessment/Plan  Principal Problem:  *COPD with exacerbation Active Problems:  Suicidal ideations  DM type 2 (diabetes mellitus, type 2)  HTN (hypertension)  DDD (degenerative disc disease)  BPH (benign prostatic hyperplasia)  Dysphagia   #1 COPD with exacerbation. This will be treated with nebulizer treatments, steroids. Spiriva will be continued. Inhaled steroids will be considered.  #2 pleuritic chest pain: This is probably from COPD. We will however check a d-dimer because of a mention of blood in the sputum.  #3 suicidal ideation: He'll be evaluated by behavioral health team. A sitter will be utilized at bedside.  #4 dysphagia. Will be evaluated using esophagogram. A bedside swallow evaluation will also be obtained. Depending on the results of these tests further evaluation may be necessary. In the, meantime, we'll put him on a mechanical soft diet.  #5 diabetes, put him on a sliding scale. Hold metformin for now. With steroids his blood sugar may increase significantly and he may require Lantus.  #6 history of hypertension. Continue with ACE inhibitors.  #7 history of BPH, stable.  #8 history of degenerative disc disease.  Patient is a full code. DVT, prophylaxis will be initiated.  Further management decisions will depend on results of further testing and patient's response to treatment.  Bradshaw Minihan 06/30/2011, 9:21 PM

## 2011-06-30 NOTE — ED Notes (Signed)
Pt is resting comfortably.  Hospitalitis at bedside and discussed plan of care with pt and examination.

## 2011-06-30 NOTE — ED Provider Notes (Signed)
Scribed for Toy Baker, MD, the patient was seen in room APA04/APA04 . This chart was scribed by Ellie Lunch.   CSN: [id number] Arrival date & time: 06/30/2011  4:13 PM   First MD Initiated Contact with Patient 06/30/11 1614      Chief Complaint  Patient presents with  . Chest Pain    (Consider location/radiation/quality/duration/timing/severity/associated sxs/prior treatment) HPI Pt was seen at 4:20  [name] is a 70 y.o. male who presents to the Emergency Department complaining of 3 days of constant chest pain associated with COPD.  Pt has reports feeling feverish.  Pt has been taking Albuterol to help with symptoms and has been taking steroids. Pt reports that chest pain worsens while coughing.  Pt complains of diaphoresis for the last 2 days and diarrhea but denies vomiting. Pt is on home oxygen (4 L) but had been on 2.5 L.  Pt has been seeing a Belva Agee NP at [facility]. Additionally pt reports having suicidal thoughts, but has not attempted suicide in the past.  Pt reports that PTSD triggers suicidal thoughts.  Complains of flashbacks.  Pt reports visual hallucinations (people).  Pt has been treated for depression in the past and drinks alcohol, but not daily.  Reports binge drinking in the past.    Past Medical History  Diagnosis Date  . HTN (hypertension)   . Alcohol abuse   . PTSD (post-traumatic stress disorder)   . Chronic back pain   . Depression   . Peripheral neuropathy   . DJD (degenerative joint disease)   . COPD (chronic obstructive pulmonary disease)   . Anemia   . Myocardial infarction   . Type II or unspecified type diabetes mellitus without mention of complication, not stated as uncontrolled     Past Surgical History  Procedure Date  . Hernia repair     X 3  . Spinal surgeries     X 4  . Appendectomy   . Prostate surgery     No family history on file.  History  Substance Use Topics  . Smoking status: Current Everyday  Smoker  . Smokeless tobacco: Not on file  . Alcohol Use: Yes     Occ     Review of Systems 10 Systems reviewed and are negative for acute change except as noted in the HPI.   Allergies  Review of patient's allergies indicates no known allergies.  Home Medications   Current Outpatient Rx  Name Route Sig Dispense Refill  . ALBUTEROL SULFATE HFA 108 (90 BASE) MCG/ACT IN AERS Inhalation Inhale 2 puffs into the lungs 4 (four) times daily as needed. For COPD     . ALPRAZOLAM 0.25 MG PO TABS Oral Take 0.25 mg by mouth 2 (two) times daily.      . BUSPIRONE HCL 15 MG PO TABS Oral Take 15 mg by mouth every 12 (twelve) hours as needed. For anxiety     . LISINOPRIL 40 MG PO TABS Oral Take 40 mg by mouth daily.      [name] METFORMIN HCL 500 MG PO TABS Oral Take 500 mg by mouth 2 (two) times daily with a meal.      . OXYCODONE HCL 5 MG PO CAPS Oral Take 5 mg by mouth every 8 (eight) hours.      . ROFLUMILAST 500 MCG PO TABS Oral Take 500 mcg by mouth daily.      [name] SILDENAFIL CITRATE 50 MG PO TABS Oral Take 50 mg by  mouth daily as needed. For intercourse     . TIOTROPIUM BROMIDE MONOHYDRATE 18 MCG IN CAPS Inhalation Place 18 mcg into inhaler and inhale daily.      . TRAMADOL HCL 50 MG PO TABS Oral Take 100 mg by mouth 2 (two) times daily.      . MELOXICAM 15 MG PO TABS Oral Take 15 mg by mouth daily.      [name] PAROXETINE HCL 40 MG PO TABS Oral Take 40 mg by mouth daily.      [name] PRAVASTATIN SODIUM 80 MG PO TABS Oral Take 80 mg by mouth daily.      [name] SALINE NASAL SPRAY 0.65 % NA SOLN Nasal Place 1 spray into the nose as needed. For nasal congestion       BP 145/81  Pulse 105  Temp(Src) 98.1 F (36.7 C) (Oral)  Resp 24  Ht 5\' 3"  (1.6 m)  Wt 140 lb (63.504 kg)  BMI 24.80 kg/m2  SpO2 99%  Physical Exam  Nursing note and vitals reviewed. Constitutional: He is oriented to person, place, and time. He appears well-developed and well-nourished.  HENT:  Head: Normocephalic and atraumatic.  Eyes: EOM  are normal. No scleral icterus.  Neck: Neck supple.  Cardiovascular: Regular rhythm.        tachycardic  Pulmonary/Chest: Effort normal. He has wheezes.       Increased expiratory wheezing in both sides.  Abdominal: Soft. There is no tenderness.  Musculoskeletal: Normal range of motion. He exhibits no edema.  Neurological: He is alert and oriented to person, place, and time.  Skin: Skin is warm and dry.    ED Course  Procedures (including critical care time)  DIAGNOSTIC STUDIES: Oxygen Saturation is 99% on , 4L normal by my interpretation.    COORDINATION OF CARE:  Labs Reviewed  COMPREHENSIVE METABOLIC PANEL - Abnormal; Notable for the following:    Sodium 127 (*)    Chloride 92 (*)    Glucose, Bld 106 (*)    Total Bilirubin 0.1 (*)    GFR calc non Af Amer 74 (*)    GFR calc Af Amer 86 (*)    All other components within normal limits  CBC - Abnormal; Notable for the following:    RBC 3.43 (*)    Hemoglobin 10.8 (*)    HCT 31.0 (*)    All other components within normal limits  DIFFERENTIAL  CARDIAC PANEL(CRET KIN+CKTOT+MB+TROPI)  ETHANOL  URINE RAPID DRUG SCREEN (HOSP PERFORMED)  URINALYSIS, ROUTINE W REFLEX MICROSCOPIC   Dg Chest 2 View  06/30/2011  *RADIOLOGY REPORT*  Clinical Data: Chest pain.  CHEST - 2 VIEW  Comparison: 04/23/2009  Findings: Heart size is normal.  The lungs are free of focal consolidations and pleural effusions.  No edema.  There is minimal left lower lobe atelectasis.  Degenerative changes are seen in the spine.  IMPRESSION:  1.  Minimal left lower lobe atelectasis. 2. No evidence for acute cardiopulmonary abnormality.  Original Report Authenticated By: Patterson Hammersmith, M.D.   ED MEDICATIONS Medications  albuterol (PROVENTIL,VENTOLIN) solution continuous neb   azithromycin (ZITHROMAX) 500 mg in dextrose 5 % 250 mL IVPB   cefTRIAXone (ROCEPHIN) 1 g in dextrose 5 % 50 mL IVPB  0.9 %  sodium chloride infusion (  Intravenous New Bag 06/30/11  1656)  methylPREDNISolone sodium succinate (SOLU-MEDROL) 125 MG injection 125 mg (125 mg Intravenous Given 06/30/11 1808)    No diagnosis found.   MDM   Date:  06/30/2011  Rate: 105  Rhythm: sinus tachycardia  QRS Axis: normal  Intervals: normal  ST/T Wave abnormalities: nonspecific ST changes  Conduction Disutrbances:none  Narrative Interpretation:   Old EKG Reviewed: none available     I personally performed the services described in this documentation, which was scribed in my presence. The recorded information has been reviewed and considered.    9:00 PM Pt given albuterol tx x 2 and steroids, remains with wheezes, act team saw pt for suicidal ideations, pt to be admitted by triad  Toy Baker, MD 06/30/11 2100

## 2011-07-01 ENCOUNTER — Inpatient Hospital Stay (HOSPITAL_COMMUNITY): Payer: Medicare Other

## 2011-07-01 DIAGNOSIS — F329 Major depressive disorder, single episode, unspecified: Secondary | ICD-10-CM | POA: Diagnosis present

## 2011-07-01 DIAGNOSIS — F17200 Nicotine dependence, unspecified, uncomplicated: Secondary | ICD-10-CM | POA: Diagnosis present

## 2011-07-01 DIAGNOSIS — F419 Anxiety disorder, unspecified: Secondary | ICD-10-CM | POA: Diagnosis present

## 2011-07-01 LAB — CARDIAC PANEL(CRET KIN+CKTOT+MB+TROPI)
CK, MB: 2.3 ng/mL (ref 0.3–4.0)
Relative Index: INVALID (ref 0.0–2.5)
Relative Index: INVALID (ref 0.0–2.5)
Relative Index: INVALID (ref 0.0–2.5)
Total CK: 65 U/L (ref 7–232)
Total CK: 59 U/L (ref 7–232)
Total CK: 70 U/L (ref 7–232)
Troponin I: <0.3 ng/mL (ref <0.30)
Troponin I: <0.3 ng/mL (ref <0.30)

## 2011-07-01 LAB — COMPREHENSIVE METABOLIC PANEL
ALT: 10 U/L (ref 0–53)
AST: 11 U/L (ref 0–37)
Albumin: 3.2 g/dL — ABNORMAL LOW (ref 3.5–5.2)
CO2: 22 mEq/L (ref 19–32)
Calcium: 9.2 mg/dL (ref 8.4–10.5)
Creatinine, Ser: 0.89 mg/dL (ref 0.50–1.35)
Sodium: 130 mEq/L — ABNORMAL LOW (ref 135–145)
Total Protein: 6.6 g/dL (ref 6.0–8.3)

## 2011-07-01 LAB — IRON AND TIBC
Saturation Ratios: 31 % (ref 20–55)
TIBC: 361 ug/dL (ref 215–435)

## 2011-07-01 LAB — CBC
HCT: 29.5 % — ABNORMAL LOW (ref 39.0–52.0)
Hemoglobin: 10.2 g/dL — ABNORMAL LOW (ref 13.0–17.0)
MCH: 31.4 pg (ref 26.0–34.0)
MCHC: 34.6 g/dL (ref 30.0–36.0)
RBC: 3.25 MIL/uL — ABNORMAL LOW (ref 4.22–5.81)

## 2011-07-01 LAB — VITAMIN B12: Vitamin B-12: 373 pg/mL (ref 211–911)

## 2011-07-01 LAB — FOLATE: Folate: 6.8 ng/mL

## 2011-07-01 LAB — OSMOLALITY, URINE: Osmolality, Ur: 216 mOsm/kg — ABNORMAL LOW (ref 390–1090)

## 2011-07-01 LAB — RETICULOCYTES: Retic Ct Pct: 1.4 % (ref 0.4–3.1)

## 2011-07-01 LAB — GLUCOSE, CAPILLARY

## 2011-07-01 LAB — HEMOGLOBIN A1C: Mean Plasma Glucose: 108 mg/dL (ref <117)

## 2011-07-01 MED ORDER — ALBUTEROL SULFATE (5 MG/ML) 0.5% IN NEBU
INHALATION_SOLUTION | RESPIRATORY_TRACT | Status: AC
  Filled 2011-07-01: qty 0.5

## 2011-07-01 MED ORDER — PANTOPRAZOLE SODIUM 40 MG PO TBEC
40.0000 mg | DELAYED_RELEASE_TABLET | Freq: Every day | ORAL | Status: DC
  Administered 2011-07-01 – 2011-07-04 (×4): 40 mg via ORAL
  Filled 2011-07-01 (×4): qty 1

## 2011-07-01 MED ORDER — BUSPIRONE HCL 5 MG PO TABS
ORAL_TABLET | ORAL | Status: AC
  Filled 2011-07-01: qty 3

## 2011-07-01 MED ORDER — ARIPIPRAZOLE 2 MG PO TABS
2.0000 mg | ORAL_TABLET | Freq: Every day | ORAL | Status: DC
  Administered 2011-07-01 – 2011-07-03 (×3): 2 mg via ORAL
  Filled 2011-07-01 (×6): qty 1

## 2011-07-01 MED ORDER — DOCUSATE SODIUM 100 MG PO CAPS
ORAL_CAPSULE | ORAL | Status: AC
  Filled 2011-07-01: qty 1

## 2011-07-01 MED ORDER — IOHEXOL 350 MG/ML SOLN
100.0000 mL | Freq: Once | INTRAVENOUS | Status: AC | PRN
  Administered 2011-07-01: 100 mL via INTRAVENOUS

## 2011-07-01 MED ORDER — OXYCODONE HCL 5 MG PO TABS
ORAL_TABLET | ORAL | Status: AC
  Filled 2011-07-01: qty 1

## 2011-07-01 MED ORDER — ESCITALOPRAM OXALATE 10 MG PO TABS
10.0000 mg | ORAL_TABLET | Freq: Every day | ORAL | Status: DC
  Administered 2011-07-01 – 2011-07-04 (×4): 10 mg via ORAL
  Filled 2011-07-01 (×4): qty 1

## 2011-07-01 MED ORDER — METHYLPREDNISOLONE SODIUM SUCC 125 MG IJ SOLR
INTRAMUSCULAR | Status: AC
  Filled 2011-07-01: qty 4

## 2011-07-01 MED ORDER — SODIUM CHLORIDE 0.9 % IJ SOLN
INTRAMUSCULAR | Status: AC
  Filled 2011-07-01: qty 3

## 2011-07-01 MED ORDER — AZITHROMYCIN 250 MG PO TABS
250.0000 mg | ORAL_TABLET | Freq: Every day | ORAL | Status: DC
  Administered 2011-07-01 – 2011-07-04 (×4): 250 mg via ORAL
  Filled 2011-07-01 (×4): qty 1

## 2011-07-01 MED ORDER — TRAZODONE HCL 50 MG PO TABS
50.0000 mg | ORAL_TABLET | Freq: Every day | ORAL | Status: DC
  Administered 2011-07-01 – 2011-07-03 (×3): 50 mg via ORAL
  Filled 2011-07-01 (×3): qty 1

## 2011-07-01 MED ORDER — PREDNISONE 20 MG PO TABS
40.0000 mg | ORAL_TABLET | Freq: Two times a day (BID) | ORAL | Status: DC
  Administered 2011-07-01 – 2011-07-03 (×5): 40 mg via ORAL
  Filled 2011-07-01 (×5): qty 2

## 2011-07-01 MED ORDER — ENOXAPARIN SODIUM 40 MG/0.4ML ~~LOC~~ SOLN
SUBCUTANEOUS | Status: AC
  Filled 2011-07-01: qty 0.4

## 2011-07-01 NOTE — BH Assessment (Signed)
Assessment Note   [name] is an 70 y.o. male. The patient has a long history of depression and suicidal ideation. He has been hospitalized at the [place] and at [facility] in the past. He is currently followed for his medications through the [place]. At this time he is off his medications as he  He has run out. He has not made a suicide gesture in the past. He has no history of homicidal ideation. He is not psychotic. He is aware that he needs more supervision for his medical issues. He is interested in going to the [place] for some form of assisted living. Patient continues to express suicidal thoughts.   Axis I: Major Depression, Recurrent severe Axis II: Deferred Axis III:  Past Medical History  Diagnosis Date  . HTN (hypertension)   . Alcohol abuse   . PTSD (post-traumatic stress disorder)   . Chronic back pain   . Depression   . Peripheral neuropathy   . DJD (degenerative joint disease)   . COPD (chronic obstructive pulmonary disease)   . Anemia   . Myocardial infarction   . Type II or unspecified type diabetes mellitus without mention of complication, not stated as uncontrolled    Axis IV: problems with access to health care services Axis V: 41-50 serious symptoms  Past Medical History:  Past Medical History  Diagnosis Date  . HTN (hypertension)   . Alcohol abuse   . PTSD (post-traumatic stress disorder)   . Chronic back pain   . Depression   . Peripheral neuropathy   . DJD (degenerative joint disease)   . COPD (chronic obstructive pulmonary disease)   . Anemia   . Myocardial infarction   . Type II or unspecified type diabetes mellitus without mention of complication, not stated as uncontrolled     Past Surgical History  Procedure Date  . Hernia repair     X 3  . Spinal surgeries     X 4  . Appendectomy   . Prostate surgery     Family History:  Family History  Problem Relation Age of Onset  . Lung disease Mother   . Prostate cancer Father     Social History:  reports  that he has been smoking.  He does not have any smokeless tobacco history on file. He reports that he drinks alcohol. He reports that he does not use illicit drugs.  Allergies: No Known Allergies  Home Medications:  Medications Prior to Admission  Medication Dose Route Frequency Provider Last Rate Last Dose  . 0.9 %  sodium chloride infusion   Intravenous Once Toy Baker, MD 125 mL/hr at 06/30/11 1656    . 0.9 %  sodium chloride infusion   Intravenous Continuous Gokul Krishnan 75 mL/hr at 07/01/11 0216    . acetaminophen (TYLENOL) tablet 650 mg  650 mg Oral Q6H PRN Gokul Krishnan       Or  . acetaminophen (TYLENOL) suppository 650 mg  650 mg Rectal Q6H PRN Osvaldo Shipper      . albuterol (PROVENTIL) (5 MG/ML) 0.5% nebulizer solution 2.5 mg  2.5 mg Nebulization Q6H Gokul Krishnan   2.5 mg at 07/01/11 0741  . albuterol (PROVENTIL) (5 MG/ML) 0.5% nebulizer solution 2.5 mg  2.5 mg Nebulization Q2H PRN Osvaldo Shipper      . albuterol (PROVENTIL) (5 MG/ML) 0.5% nebulizer solution           . albuterol (PROVENTIL) (5 MG/ML) 0.5% nebulizer solution           .  albuterol (PROVENTIL, VENTOLIN) (5 MG/ML) 0.5% continuous inhalation solution           . albuterol (PROVENTIL,VENTOLIN) solution continuous neb  10 mg/hr Nebulization Once Toy Baker, MD   10 mg/hr at 06/30/11 1912  . ALPRAZolam Prudy Feeler) tablet 0.25 mg  0.25 mg Oral TID PRN Osvaldo Shipper      . azithromycin (ZITHROMAX) 500 mg in dextrose 5 % 250 mL IVPB  500 mg Intravenous Q24H Toy Baker, MD   500 mg at 06/30/11 1927  . budesonide-formoterol (SYMBICORT) 160-4.5 MCG/ACT inhaler 2 puff  2 puff Inhalation BID Gokul Krishnan      . busPIRone (BUSPAR) 5 MG tablet           . busPIRone (BUSPAR) tablet 15 mg  15 mg Oral BID Gokul Krishnan   15 mg at 07/01/11 0930  . cefTRIAXone (ROCEPHIN) 1 g in dextrose 5 % 50 mL IVPB  1 g Intravenous Q24H Toy Baker, MD   1 g at 06/30/11 2107  . docusate sodium (COLACE) 100 MG capsule             . docusate sodium (COLACE) capsule 100 mg  100 mg Oral BID Gokul Krishnan   100 mg at 07/01/11 0930  . enoxaparin (LOVENOX) 40 MG/0.4ML injection           . enoxaparin (LOVENOX) injection 40 mg  40 mg Subcutaneous QHS Gokul Krishnan   40 mg at 07/01/11 0219  . insulin aspart (novoLOG) injection 0-20 Units  0-20 Units Subcutaneous TID WC Gokul Krishnan   7 Units at 07/01/11 0832  . insulin aspart (novoLOG) injection 0-5 Units  0-5 Units Subcutaneous QHS Gokul Krishnan      . iohexol (OMNIPAQUE) 350 MG/ML injection 100 mL  100 mL Intravenous Once PRN Medication Radiologist   100 mL at 07/01/11 0046  . lisinopril (PRINIVIL,ZESTRIL) tablet 40 mg  40 mg Oral Daily Gokul Krishnan   40 mg at 07/01/11 0929  . meloxicam (MOBIC) tablet 15 mg  15 mg Oral Daily Allie Bossier Zoar, PHARMD   15 mg at 07/01/11 0929  . methylPREDNISolone sodium succinate (SOLU-MEDROL) 125 MG injection 125 mg  125 mg Intravenous Once Toy Baker, MD   125 mg at 06/30/11 1808  . methylPREDNISolone sodium succinate (SOLU-MEDROL) 125 MG injection 80 mg  80 mg Intravenous Q6H Gokul Krishnan   80 mg at 07/01/11 0641  . methylPREDNISolone sodium succinate (SOLU-MEDROL) 125 MG injection           . morphine 4 MG/ML injection 4 mg  4 mg Intravenous Once Toy Baker, MD   4 mg at 06/30/11 1959  . multivitamins ther. w/minerals tablet 1 tablet  1 tablet Oral Daily Gokul Krishnan   1 tablet at 07/01/11 0929  . ondansetron (ZOFRAN) tablet 4 mg  4 mg Oral Q6H PRN Gokul Krishnan       Or  . ondansetron (ZOFRAN) injection 4 mg  4 mg Intravenous Q6H PRN Gokul Krishnan      . oxyCODONE (Oxy IR/ROXICODONE) 5 MG immediate release tablet           . oxyCODONE (Oxy IR/ROXICODONE) immediate release tablet 5 mg  5 mg Oral Q6H PRN Gokul Krishnan   5 mg at 07/01/11 0415  . PARoxetine (PAXIL) tablet 40 mg  40 mg Oral Daily Gokul Krishnan   40 mg at 07/01/11 0930  . roflumilast (DALIRESP) tablet 500 mcg  500 mcg Oral Daily Gokul Rito Ehrlich  500 mcg  at 07/01/11 0929  . senna (SENOKOT) tablet 17.2 mg  2 tablet Oral Daily PRN Gokul Krishnan      . senna-docusate (Senokot-S) tablet 1 tablet  1 tablet Oral Daily PRN Gokul Krishnan      . simvastatin (ZOCOR) tablet 40 mg  40 mg Oral q1800 Gokul Krishnan      . tiotropium (SPIRIVA) inhalation capsule 18 mcg  18 mcg Inhalation Daily Gokul Krishnan       Medications Prior to Admission  Medication Sig Dispense Refill  . ALPRAZolam (XANAX) 0.25 MG tablet Take 0.25 mg by mouth 3 (three) times daily as needed.       [name] lisinopril (PRINIVIL,ZESTRIL) 40 MG tablet Take 40 mg by mouth daily.        . metFORMIN (GLUCOPHAGE) 500 MG tablet Take 1,000 mg by mouth 2 (two) times daily with a meal.       . oxycodone (OXY-IR) 5 MG capsule Take 5 mg by mouth every 8 (eight) hours.        . traMADol (ULTRAM) 50 MG tablet Take 100 mg by mouth 3 (three) times daily as needed.       . pravastatin (PRAVACHOL) 80 MG tablet Take 80 mg by mouth daily.          OB/GYN Status:  No LMP for male patient.  General Assessment Data Assessment Number: 1  Living Arrangements: Alone Can pt return to current living arrangement?: Yes Admission Status: Voluntary Is patient capable of signing voluntary admission?: Yes Transfer from: Acute Hospital Referral Source: Medical Floor Inpatient  Risk to self Suicidal Ideation: Yes-Currently Present Suicidal Intent: No-Not Currently/Within Last 6 Months Is patient at risk for suicide?: Yes Suicidal Plan?: No-Not Currently/Within Last 6 Months Access to Means: No What has been your use of drugs/alcohol within the last 12 months?: etoh 6 pack every 2-3 weeks Other Self Harm Risks: no Triggers for Past Attempts: None known Intentional Self Injurious Behavior: None Factors that decrease suicide risk: Positive social support Family Suicide History: No Recent stressful life event(s): Recent negative physical changes Persecutory voices/beliefs?: No Depression: Yes Depression  Symptoms: Isolating;Fatigue;Loss of interest in usual pleasures;Feeling worthless/self pity Substance abuse history and/or treatment for substance abuse?: Yes (drinks beer has cut down to 6 pack every 2-3 weeks) Suicide prevention information given to non-admitted patients: Not applicable  Risk to Others Homicidal Ideation: No Thoughts of Harm to Others: No Current Homicidal Intent: No Current Homicidal Plan: No Access to Homicidal Means: No Identified Victim: none History of harm to others?: No Assessment of Violence: None Noted Violent Behavior Description: na Does patient have access to weapons?: Yes (Comment) (has no ammo for his gun) Criminal Charges Pending?: No Does patient have a court date: No  Mental Status Report Appear/Hygiene: Disheveled Eye Contact: Good Motor Activity: Unsteady;Freedom of movement Speech: Logical/coherent Level of Consciousness: Alert Mood: Depressed;Anxious Affect: Appropriate to circumstance Anxiety Level: Minimal Thought Processes: Coherent;Relevant Judgement: Unimpaired Orientation: Person;Place;Time;Situation Obsessive Compulsive Thoughts/Behaviors: None  Cognitive Functioning Concentration: Decreased Memory: Recent Intact;Remote Intact IQ: Average Insight: Good Impulse Control: Good Appetite: Fair Weight Loss: 0  Weight Gain: 0  Sleep: No Change Total Hours of Sleep: 6  Vegetative Symptoms: None  Prior Inpatient/Outpatient Therapy Prior Therapy: Inpatient Prior Therapy Dates: unknown [facility] Reason for Treatment: depression  ADL Screening (condition at time of admission) Patient's cognitive ability adequate to safely complete daily activities?: Yes Patient able to express need for assistance with ADLs?: Yes Independently  performs ADLs?: Yes Weakness of Legs: Both Weakness of Arms/Hands: None  Home Assistive Devices/Equipment Home Assistive Devices/Equipment: Oxygen;Cane;Walker (specify  type)  Therapy Consults (therapy consults require a physician order) PT Evaluation Needed: Yes (Comment) OT Evalulation Needed: No SLP Evaluation Needed: No Abuse/Neglect Assessment (Assessment to be complete while patient is alone) Physical Abuse: Denies Verbal Abuse: Denies Sexual Abuse: Denies Exploitation of patient/patient's resources: Denies Self-Neglect: Denies Values / Beliefs Cultural Requests During Hospitalization: None Spiritual Requests During Hospitalization: None Consults Spiritual Care Consult Needed: Yes (Comment) Social Work Consult Needed: No Merchant navy officer (For Healthcare) Advance Directive: Patient does not have advance directive Pre-existing out of facility DNR order (yellow form or pink MOST form): No Nutrition Screen Diet: Other (Comment) (dsy 3) Unintentional weight loss greater than 10lbs within the last month: No Dysphagia: No Home Tube Feeding or Total Parenteral Nutrition (TPN): No Patient appears severely malnourished: No Pregnant or Lactating: No Dietitian Consult Needed: No  Additional Information 1:1 In Past 12 Months?: No CIRT Risk: No Elopement Risk: No Does patient have medical clearance?: No     Disposition:  Disposition Disposition of Patient: Other dispositions (remain on medical floor) Other disposition(s): Other (Comment) (recommended  tele-psych to Paris Lore MD) Spoke with Paris Lore MD and recommended that the sitter remain in place at this time. Suggested that a tele-psych might help with medication issues. Spoke with Roe Coombs, Child psychotherapist, and asked that he [facility].  On Site Evaluation by:   Reviewed with Physician:     Jake Shark [facility] 07/01/2011 11:07 AM

## 2011-07-01 NOTE — Progress Notes (Signed)
Physical Therapy Evaluation Patient Details Name: [name] MRN: [id number] DOB: 09-Aug-1940 Today's Date: 07/01/2011  Problem List:  Patient Active Problem List  Diagnoses  . COPD with exacerbation  . Suicidal ideations  . DM type 2 (diabetes mellitus, type 2)  . HTN (hypertension)  . DDD (degenerative disc disease)  . BPH (benign prostatic hyperplasia)  . Dysphagia    Past Medical History:  Past Medical History  Diagnosis Date  . HTN (hypertension)   . Alcohol abuse   . PTSD (post-traumatic stress disorder)   . Chronic back pain   . Depression   . Peripheral neuropathy   . DJD (degenerative joint disease)   . COPD (chronic obstructive pulmonary disease)   . Anemia   . Myocardial infarction   . Type II or unspecified type diabetes mellitus without mention of complication, not stated as uncontrolled    Past Surgical History:  Past Surgical History  Procedure Date  . Hernia repair     X 3  . Spinal surgeries     X 4  . Appendectomy   . Prostate surgery     PT Assessment/Plan/Recommendation PT Assessment Clinical Impression Statement: pt appears to be at prior functional level...he ambulates with walker and is very sedentary at home...he continues to smoke.Marland KitchenMarland KitchenI have recommended that he think about transferring to [facility] for better ADL support, but he flatly refuses.Marland KitchenMarland KitchenI'm not sure that there is much we can offer this gentleman.Marland KitchenMarland KitchenI have asked aide to ambulate daily in hall with a walker                         man PT Recommendation/Assessment: Patent does not need any further PT services No Skilled PT: Patient at baseline level of functioning PT Goals     PT Evaluation Precautions/Restrictions  Precautions Required Braces or Orthoses: No Restrictions Weight Bearing Restrictions: No Prior Functioning  Home Living Lives With: Alone Receives Help From: Friend(s) Type of Home: House Home Layout: One level Home Access: Stairs to enter Entrance Stairs-Rails:  Right Entrance Stairs-Number of Steps: 4 Home Adaptive Equipment: Walker - rolling;Straight cane Prior Function Level of Independence: Independent with basic ADLs;Independent with gait;Independent with homemaking with ambulation;Independent with transfers;Requires assistive device for independence Driving: No Vocation: Retired Producer, television/film/video: Awake/alert Overall Cognitive Status: Appears within functional limits for tasks assessed Orientation Level: Oriented X4 Sensation/Coordination Sensation Light Touch: Appears Intact (pt reports peripheral neuropathy in feet) Stereognosis: Not tested Hot/Cold: Not tested Proprioception: Appears Intact Extremity Assessment RUE Assessment RUE Assessment: Within Functional Limits LUE Assessment LUE Assessment: Within Functional Limits RLE Assessment RLE Assessment: Within Functional Limits LLE Assessment LLE Assessment: Within Functional Limits Mobility (including Balance) Bed Mobility Bed Mobility: Yes Left Sidelying to Sit: 7: Independent Supine to Sit: 7: Independent Transfers Transfers: Yes Sit to Stand: 7: Independent Stand to Sit: 7: Independent Ambulation/Gait Ambulation/Gait: Yes Ambulation/Gait Assistance: 6: Modified independent (Device/Increase time) Ambulation Distance (Feet): 250 Feet Assistive device: Rolling walker Gait Pattern: Within Functional Limits Stairs: No Wheelchair Mobility Wheelchair Mobility: No  Posture/Postural Control Posture/Postural Control: No significant limitations Balance Balance Assessed:  (WNL) Exercise    End of Session PT - End of Session Equipment Utilized During Treatment: Gait belt Activity Tolerance: Patient tolerated treatment well Patient left: in bed;with call bell in reach;with family/visitor present Nurse Communication: Mobility status for transfers;Mobility status for ambulation General Behavior During Session: [facility] for tasks performed Cognition: [facility] for  tasks performed  Konrad Penta 07/01/2011, 11:17 AM

## 2011-07-01 NOTE — Progress Notes (Signed)
Safety Sitter discontinued Per MD order. Tele-psych consult done. Orders to be reviewed by Dr. Lendell Caprice. Psychiatric MD recommends Psych Placement.

## 2011-07-01 NOTE — Progress Notes (Signed)
Speech Language/Pathology Clinical/Bedside Swallow Evaluation Patient Details  Name: [name] MRN: [id number] DOB: 23-Jul-1941 Today's Date: 07/01/2011  Past Medical History:  Past Medical History  Diagnosis Date  . HTN (hypertension)   . Alcohol abuse   . PTSD (post-traumatic stress disorder)   . Chronic back pain   . Depression   . Peripheral neuropathy   . DJD (degenerative joint disease)   . COPD (chronic obstructive pulmonary disease)   . Anemia   . Myocardial infarction   . Type II or unspecified type diabetes mellitus without mention of complication, not stated as uncontrolled    Past Surgical History:  Past Surgical History  Procedure Date  . Hernia repair     X 3  . Spinal surgeries     X 4  . Appendectomy   . Prostate surgery     Assessment/Recommendations/Treatment Plan Suspected Esophageal Findings Suspected Esophageal Findings: Globus sensation (question whether globus sensation could be due reflux?) Pt is unsure if he is on a PPI.  SLP Assessment Clinical Impression Statement: Mild oral phase dysphagia secondary to need for upper and lower dentures, slightly loose-fitting, however WFL. No overt signs or symptoms of aspiration, however barium swallow earlier today did show evidence of trace transient laryngeal penetration. Aspiration and swallowing strategies were reviewed with patient. Pt acknowledges that when he eats slowly, he doesn't have much difficulty swalllowing. Risk for Aspiration: Mild (COPD) Other Related Risk Factors: History of pneumonia;Decreased respiratory status  Recommendations Solid Consistency: Regular Liquid Consistency: Thin Liquid Administration via: Cup;Straw Medication Administration: Whole meds with liquid Supervision: Patient able to self feed Compensations: Slow rate;Small sips/bites;Multiple dry swallows after each bite/sip;Follow solids with liquid Postural Changes and/or Swallow Maneuvers: Seated upright 90  degrees;Upright 30-60 min after meal Oral Care Recommendations: Oral care BID Other Recommendations: Clarify dietary restrictions Encourage patient to cut his meats well and chew thoroughly.   Individuals Consulted Consulted and Agree with Results and Recommendations: Patient  Swallow Study Goals  SLP Swallowing Goals Patient will consume recommended diet without observed clinical signs of aspiration with: Independent assistance Patient will utilize recommended strategies during swallow to increase swallowing safety with: Independent assistance  Swallow Study Prior Functional Status   Tolerated a regular diet with thin liquids when wearing his dentures.  General  Date of Onset: 06/30/11 Other Pertinent Information: Mr. Capers was admitted with a COPD exacerbation and complained of intermittent dysphagia. 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 During pt interview, he describes that he feels like food or "something" gets stuck and points to  level of hyoid. He wears dentures and typicall eats what he likes. Type of Study: Bedside swallow evaluation Diet Prior to this Study: Dysphagia 3 (soft);Thin liquids Temperature Spikes Noted: No Respiratory Status: Supplemental O2 delivered via (comment) History of Intubation: No Behavior/Cognition: Alert;Cooperative;Pleasant mood Oral Cavity - Dentition: Dentures, top;Dentures, bottom Patient Positioning: Upright in bed (Sitting at edge of bed) Baseline Vocal Quality: Normal;Other (comment) (raspy) Volitional Cough: Strong;Congested Volitional Swallow: Able to elicit Ice chips: Not tested  Oral Motor/Sensory Function  Labial ROM: Within Functional Limits Labial Symmetry: Within Functional Limits Labial Strength: Within Functional Limits Labial Sensation: Within Functional Limits Lingual ROM: Within Functional Limits Lingual Symmetry: Within  Functional Limits Lingual Strength: Within Functional Limits Lingual Sensation: Within Functional Limits Facial ROM: Within Functional Limits Facial Symmetry: Within Functional Limits Facial Strength: Within Functional Limits Facial Sensation: Within Functional Limits Velum: Within Functional Limits Mandible:  Within Functional Limits  Consistency Results  Ice Chips Ice chips: Not tested  Thin Liquid Thin Liquid: Within functional limits Presentation: Cup;Self Fed;Straw  Nectar Thick Liquid Nectar Thick Liquid: Not tested  Honey Thick Liquid Honey Thick Liquid: Not tested  Puree Puree: Within functional limits  Solid Solid: Within functional limits   Cambry Spampinato 07/01/2011,5:59 PM

## 2011-07-01 NOTE — Progress Notes (Addendum)
Chart reviewed.  Subjective: Breathing easier. Gets winded with dyspnea particularly off oxygen. Feels less suicidal. He ran out of his medications about a week ago and thinks that his suicidal ideation and may be related to being off his medication. He does not feel like killing himself currently and is able to contract for safety. Pain is better. Interested in going to the assisted living at the [place]. He is interested in talking to a psychiatrist while here if able  Objective: Vital signs in last 24 hours: Filed Vitals:   07/01/11 1204 07/01/11 1210 07/01/11 1349 07/01/11 1509  BP:    184/74  Pulse:    110  Temp:    97.6 F (36.4 C)  TempSrc:    Oral  Resp:    18  Height:      Weight:      SpO2: 99% 99% 99% 99%   Weight change:   Intake/Output Summary (Last 24 hours) at 07/01/11 1521 Last data filed at 07/01/11 0900  Gross per 24 hour  Intake    678 ml  Output    500 ml  Net    178 ml   Physical Exam: General: Comfortable. Lungs diminished throughout with mild wheeze and prolonged expiratory phase. Cardiovascular regular rate rhythm without murmurs gallops rubs Abdomen soft nontender nondistended Extremities no clubbing cyanosis or edema Psychiatric: Good eye contact. Talkative. Smiling. Does not appear depressed currently.  Lab Results: Basic Metabolic Panel:  Lab 07/01/11 1610 06/30/11 1636  NA 130* 127*  K 4.5 4.3  CL 99 92*  CO2 22 25  GLUCOSE 229* 106*  BUN 16 13  CREATININE 0.89 1.01  CALCIUM 9.2 9.6  MG -- --  PHOS -- --   Liver Function Tests:  Lab 07/01/11 0742 06/30/11 1636  AST 11 15  ALT 10 12  ALKPHOS 95 88  BILITOT 0.1* 0.1*  PROT 6.6 7.1  ALBUMIN 3.2* 3.6   No results found for this basename: LIPASE:2,AMYLASE:2 in the last 168 hours No results found for this basename: AMMONIA:2 in the last 168 hours CBC:  Lab 07/01/11 0742 06/30/11 1636  WBC 7.5 7.9  NEUTROABS -- 4.5  HGB 10.2* 10.8*  HCT 29.5* 31.0*  MCV 90.8 90.4  PLT 255 287    Cardiac Enzymes:  Lab 07/01/11 0742 07/01/11 0041 06/30/11 1636  CKTOTAL 59 65 75  CKMB 2.3 2.4 2.5  CKMBINDEX -- -- --  TROPONINI <0.30 <0.30 <0.30   BNP: No results found for this basename: POCBNP:3 in the last 168 hours D-Dimer:  Lab 06/30/11 2147  DDIMER 0.86*   CBG:  Lab 07/01/11 1113 07/01/11 0828  GLUCAP 112* 239*   Hemoglobin A1C: No results found for this basename: HGBA1C in the last 168 hours Fasting Lipid Panel: No results found for this basename: CHOL,HDL,LDLCALC,TRIG,CHOLHDL,LDLDIRECT in the last 960 hours Thyroid Function Tests: No results found for this basename: TSH,T4TOTAL,FREET4,T3FREE,THYROIDAB in the last 168 hours Coagulation: No results found for this basename: LABPROT:4,INR:4 in the last 168 hours Anemia Panel:  Lab 07/01/11 0742  VITAMINB12 --  FOLATE --  FERRITIN --  TIBC --  IRON --  RETICCTPCT 1.4   Urine Drug Screen: Drugs of Abuse     Component Value Date/Time   LABOPIA NONE DETECTED 06/30/2011 1945   COCAINSCRNUR NONE DETECTED 06/30/2011 1945   LABBENZ NONE DETECTED 06/30/2011 1945   AMPHETMU NONE DETECTED 06/30/2011 1945   THCU NONE DETECTED 06/30/2011 1945   LABBARB NONE DETECTED 06/30/2011 1945    Micro  Results: No results found for this or any previous visit (from the past 240 hour(s)). Studies/Results: Dg Chest 2 View  06/30/2011  *RADIOLOGY REPORT*  Clinical Data: Chest pain.  CHEST - 2 VIEW  Comparison: 04/23/2009  Findings: Heart size is normal.  The lungs are free of focal consolidations and pleural effusions.  No edema.  There is minimal left lower lobe atelectasis.  Degenerative changes are seen in the spine.  IMPRESSION:  1.  Minimal left lower lobe atelectasis. 2. No evidence for acute cardiopulmonary abnormality.  Original Report Authenticated By: Patterson Hammersmith, M.D.   Ct Angio Chest W/cm &/or Wo Cm  07/01/2011  *RADIOLOGY REPORT*  Clinical Data:  Chest pain and shortness of breath.  CT ANGIOGRAPHY CHEST  WITH CONTRAST  Technique:  Multidetector CT imaging of the chest was performed using the standard protocol during bolus administration of intravenous contrast.  Multiplanar CT image reconstructions including MIPs were obtained to evaluate the vascular anatomy.  Contrast: OMNIPAQUE IOHEXOL 350 MG/ML IV SOLN  Comparison:  None  Findings:  The pulmonary arteries are well opacified.  There is no evidence of acute pulmonary embolism.  The thoracic aorta is also opacified with contrast and demonstrates no evidence of aneurysmal disease or dissection.  The heart size is normal.  No pleural or pericardial fluid identified.  Visible calcified plaque is present at the level of the proximal LAD and potentially also the left main coronary artery.  Lungs show moderate emphysematous disease with multiple bullae present in both lungs.  There is no evidence of pulmonary mass, infiltrate or edema.  No enlarged lymph nodes are identified.  Bony structures show mild compression of the T8 vertebral body. This vertebral body shows evidence of some degree of sclerosis and approximately 25 - 30% loss of anterior height.  No other sclerotic bony lesions are identified in the chest and the relative sclerosis may simply be due to compression.  However, correlation is suggested with any history of malignancy.  This is visible on the lateral chest x-ray of 11/26.  It was not present by chest x-ray in 2010.  Review of the MIP images confirms the above findings.  IMPRESSION:  1.  No evidence of pulmonary embolism. 2.  Evidence of coronary atherosclerosis with calcified plaque present at the level of the proximal LAD and also potentially extending into the left main coronary artery. 3.  Significant emphysematous lung disease. 4.  Mild compression deformity of the T8 vertebral body which appears to be new since 2010.  This vertebral body is partially sclerotic and pathologic fracture is not completely excluded.  Original Report Authenticated  By: Reola Calkins, M.D.   Dg Esophagus  07/01/2011  *RADIOLOGY REPORT*  Clinical Data: Thoracic esophageal dysphagia  ESOPHOGRAM/BARIUM SWALLOW  Technique:  Single contrast examination was performed using thin barium.  Fluoroscopy time:  2.3 minutes.  Comparison:  None  Findings: Minimal age-related diffuse impairment of esophageal motility. No esophageal mass or stricture. 12.5 mm diameter barium tablet passes from oral cavity to stomach without obstruction. Atherosclerotic calcification aorta noted. Esophageal mucosa appears grossly smooth on single contrast images without definite areas of irregularity or ulceration. No persistent intraluminal filling defects, mass or obstruction identified. No hiatal hernia or gastroesophageal reflux seen. Minimal laryngeal penetration was transiently noted without evidence of aspiration.  IMPRESSION: Minimal laryngeal penetration without aspiration. Minimal age-related esophageal dysmotility. No evidence of esophageal mass or, stricture, or obstruction.  Original Report Authenticated By: Lollie Marrow, M.D.   Scheduled  Meds:   . sodium chloride   Intravenous Once  . albuterol  2.5 mg Nebulization Q6H  . albuterol      . albuterol      . albuterol      . albuterol  10 mg/hr Nebulization Once  . azithromycin  500 mg Intravenous Q24H  . budesonide-formoterol  2 puff Inhalation BID  . busPIRone      . busPIRone  15 mg Oral BID  . cefTRIAXone (ROCEPHIN) IV  1 g Intravenous Q24H  . docusate sodium      . docusate sodium  100 mg Oral BID  . enoxaparin      . enoxaparin  40 mg Subcutaneous QHS  . insulin aspart  0-20 Units Subcutaneous TID WC  . insulin aspart  0-5 Units Subcutaneous QHS  . lisinopril  40 mg Oral Daily  . meloxicam  15 mg Oral Daily  . methylPREDNISolone (SOLU-MEDROL) injection  125 mg Intravenous Once  . methylPREDNISolone (SOLU-MEDROL) injection  80 mg Intravenous Q6H  . methylPREDNISolone sodium succinate      . morphine  4 mg  Intravenous Once  . multivitamins ther. w/minerals  1 tablet Oral Daily  . oxyCODONE      . PARoxetine  40 mg Oral Daily  . roflumilast  500 mcg Oral Daily  . simvastatin  40 mg Oral q1800  . tiotropium  18 mcg Inhalation Daily   Continuous Infusions:   . DISCONTD: sodium chloride 75 mL/hr at 07/01/11 0216   PRN Meds:.acetaminophen, albuterol, ALPRAZolam, iohexol, ondansetron (ZOFRAN) IV, ondansetron, oxyCODONE, senna, senna-docusate, DISCONTD: acetaminophen  Assessment/Plan: Principal Problem:  *COPD with exacerbation: Improved. Change to oral antibiotic and steroids.   Suicidal ideations: Patient is not actively suicidal currently and can contract for safety. I will ask for a [facility] consult and discontinue one-to-one suicide precautions. He appears quite aged and happy today.   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 Tapering the steroids will help.   HTN (hypertension): Poorly controlled. We'll discontinue IV fluid and adjust medications as needed.   Dysphagia: Esophagram unremarkable. Speech therapy is evaluating the patient.   Depression medications have been resumed.   Anxiety medications have been resumed.   DDD (degenerative disc disease)   BPH (benign prostatic hyperplasia)   Tobacco abuse: He has cut down quite a bit recently.  Anemia: Anemia panel pending. No evidence of bleeding currently.  Hyponatremia improved. TSH is pending.   LOS: 1 day   Jaszmine Navejas L 07/01/2011, 3:21 PM   .

## 2011-07-01 NOTE — Progress Notes (Signed)
Reviewed consult from the tele psychiatry. Recommendations are to stop Paxil, stop BuSpar, start Lexapro 10 mg every morning, Abilify 2 mg every evening, trazodone 50 mg every evening, Xanax as currently getting, an inpatient psychiatry transfer once medically stable.

## 2011-07-02 DIAGNOSIS — E871 Hypo-osmolality and hyponatremia: Secondary | ICD-10-CM | POA: Diagnosis present

## 2011-07-02 DIAGNOSIS — D649 Anemia, unspecified: Secondary | ICD-10-CM | POA: Diagnosis present

## 2011-07-02 LAB — GLUCOSE, CAPILLARY
Glucose-Capillary: 124 mg/dL — ABNORMAL HIGH (ref 70–99)
Glucose-Capillary: 119 mg/dL — ABNORMAL HIGH (ref 70–99)

## 2011-07-02 MED ORDER — SODIUM CHLORIDE 0.9 % IN NEBU
INHALATION_SOLUTION | RESPIRATORY_TRACT | Status: AC
  Filled 2011-07-02: qty 3

## 2011-07-02 MED ORDER — INSULIN ASPART 100 UNIT/ML ~~LOC~~ SOLN: 0.0000 [IU] | Freq: Every day | SUBCUTANEOUS | Status: DC

## 2011-07-02 MED ORDER — INSULIN ASPART 100 UNIT/ML ~~LOC~~ SOLN
0.0000 [IU] | Freq: Three times a day (TID) | SUBCUTANEOUS | Status: DC
  Administered 2011-07-02: 2 [IU] via SUBCUTANEOUS

## 2011-07-02 NOTE — Progress Notes (Signed)
Chart reviewed.  Subjective: He believes that he is less short of breath. Gets winded with dyspnea particularly off oxygen. Feels less suicidal, but is accepting of inpatient psychiatric treatment.  Objective: Vital signs in last 24 hours: Filed Vitals:   07/01/11 2020 07/01/11 2154 07/02/11 0516 07/02/11 0724  BP:  145/56 149/62   Pulse:  104 85   Temp:  97.8 F (36.6 C) 97.9 F (36.6 C)   TempSrc:  Oral Oral   Resp:  20 20   Height:      Weight:      SpO2: 99% 97% 100% 99%   Weight change:   Intake/Output Summary (Last 24 hours) at 07/02/11 1241 Last data filed at 07/02/11 0800  Gross per 24 hour  Intake    840 ml  Output      0 ml  Net    840 ml   Physical Exam: General: Comfortable. Lungs: A few rhonchorous wheezing and prolonged expiratory phase. Cardiovascular regular rate rhythm without murmurs gallops rubs Abdomen soft nontender nondistended Extremities no clubbing cyanosis or edema Psychiatric: Good eye contact. Flat affect. Speech is clear.  Lab Results: Basic Metabolic Panel:  Lab 07/01/11 9604 06/30/11 1636  NA 130* 127*  K 4.5 4.3  CL 99 92*  CO2 22 25  GLUCOSE 229* 106*  BUN 16 13  CREATININE 0.89 1.01  CALCIUM 9.2 9.6  MG -- --  PHOS -- --   Liver Function Tests:  Lab 07/01/11 0742 06/30/11 1636  AST 11 15  ALT 10 12  ALKPHOS 95 88  BILITOT 0.1* 0.1*  PROT 6.6 7.1  ALBUMIN 3.2* 3.6   No results found for this basename: LIPASE:2,AMYLASE:2 in the last 168 hours No results found for this basename: AMMONIA:2 in the last 168 hours CBC:  Lab 07/01/11 0742 06/30/11 1636  WBC 7.5 7.9  NEUTROABS -- 4.5  HGB 10.2* 10.8*  HCT 29.5* 31.0*  MCV 90.8 90.4  PLT 255 287   Cardiac Enzymes:  Lab 07/01/11 1513 07/01/11 0742 07/01/11 0041  CKTOTAL 70 59 65  CKMB 2.6 2.3 2.4  CKMBINDEX -- -- --  TROPONINI <0.30 <0.30 <0.30   BNP: No results found for this basename: POCBNP:3 in the last 168 hours D-Dimer:  Lab 06/30/11 2147  DDIMER 0.86*    CBG:  Lab 07/02/11 1115 07/02/11 0724 07/01/11 2152 07/01/11 1615 07/01/11 1113 07/01/11 0828  GLUCAP 119* 124* 173* 185* 112* 239*   Hemoglobin A1C:  Lab 07/01/11 0041  HGBA1C 5.4   Fasting Lipid Panel: No results found for this basename: CHOL,HDL,LDLCALC,TRIG,CHOLHDL,LDLDIRECT in the last 540 hours Thyroid Function Tests:  Lab 07/01/11 0041  TSH 1.577  T4TOTAL --  FREET4 --  T3FREE --  THYROIDAB --   Coagulation: No results found for this basename: LABPROT:4,INR:4 in the last 168 hours Anemia Panel:  Lab 07/01/11 0742  VITAMINB12 373  FOLATE 6.8  FERRITIN 153  TIBC 361  IRON 111  RETICCTPCT 1.4   Urine Drug Screen: Drugs of Abuse     Component Value Date/Time   LABOPIA NONE DETECTED 06/30/2011 1945   COCAINSCRNUR NONE DETECTED 06/30/2011 1945   LABBENZ NONE DETECTED 06/30/2011 1945   AMPHETMU NONE DETECTED 06/30/2011 1945   THCU NONE DETECTED 06/30/2011 1945   LABBARB NONE DETECTED 06/30/2011 1945    Micro Results: No results found for this or any previous visit (from the past 240 hour(s)). Studies/Results: Dg Chest 2 View  06/30/2011  *RADIOLOGY REPORT*  Clinical Data: Chest pain.  CHEST - 2 VIEW  Comparison: 04/23/2009  Findings: Heart size is normal.  The lungs are free of focal consolidations and pleural effusions.  No edema.  There is minimal left lower lobe atelectasis.  Degenerative changes are seen in the spine.  IMPRESSION:  1.  Minimal left lower lobe atelectasis. 2. No evidence for acute cardiopulmonary abnormality.  Original Report Authenticated By: Patterson Hammersmith, M.D.   Ct Angio Chest W/cm &/or Wo Cm  07/01/2011  *RADIOLOGY REPORT*  Clinical Data:  Chest pain and shortness of breath.  CT ANGIOGRAPHY CHEST WITH CONTRAST  Technique:  Multidetector CT imaging of the chest was performed using the standard protocol during bolus administration of intravenous contrast.  Multiplanar CT image reconstructions including MIPs were obtained to evaluate  the vascular anatomy.  Contrast: OMNIPAQUE IOHEXOL 350 MG/ML IV SOLN  Comparison:  None  Findings:  The pulmonary arteries are well opacified.  There is no evidence of acute pulmonary embolism.  The thoracic aorta is also opacified with contrast and demonstrates no evidence of aneurysmal disease or dissection.  The heart size is normal.  No pleural or pericardial fluid identified.  Visible calcified plaque is present at the level of the proximal LAD and potentially also the left main coronary artery.  Lungs show moderate emphysematous disease with multiple bullae present in both lungs.  There is no evidence of pulmonary mass, infiltrate or edema.  No enlarged lymph nodes are identified.  Bony structures show mild compression of the T8 vertebral body. This vertebral body shows evidence of some degree of sclerosis and approximately 25 - 30% loss of anterior height.  No other sclerotic bony lesions are identified in the chest and the relative sclerosis may simply be due to compression.  However, correlation is suggested with any history of malignancy.  This is visible on the lateral chest x-ray of 11/26.  It was not present by chest x-ray in 2010.  Review of the MIP images confirms the above findings.  IMPRESSION:  1.  No evidence of pulmonary embolism. 2.  Evidence of coronary atherosclerosis with calcified plaque present at the level of the proximal LAD and also potentially extending into the left main coronary artery. 3.  Significant emphysematous lung disease. 4.  Mild compression deformity of the T8 vertebral body which appears to be new since 2010.  This vertebral body is partially sclerotic and pathologic fracture is not completely excluded.  Original Report Authenticated By: Reola Calkins, M.D.   Dg Esophagus  07/01/2011  *RADIOLOGY REPORT*  Clinical Data: Thoracic esophageal dysphagia  ESOPHOGRAM/BARIUM SWALLOW  Technique:  Single contrast examination was performed using thin barium.  Fluoroscopy  time:  2.3 minutes.  Comparison:  None  Findings: Minimal age-related diffuse impairment of esophageal motility. No esophageal mass or stricture. 12.5 mm diameter barium tablet passes from oral cavity to stomach without obstruction. Atherosclerotic calcification aorta noted. Esophageal mucosa appears grossly smooth on single contrast images without definite areas of irregularity or ulceration. No persistent intraluminal filling defects, mass or obstruction identified. No hiatal hernia or gastroesophageal reflux seen. Minimal laryngeal penetration was transiently noted without evidence of aspiration.  IMPRESSION: Minimal laryngeal penetration without aspiration. Minimal age-related esophageal dysmotility. No evidence of esophageal mass or, stricture, or obstruction.  Original Report Authenticated By: Lollie Marrow, M.D.   Scheduled Meds:    . albuterol  2.5 mg Nebulization Q6H  . albuterol      . albuterol      . ARIPiprazole  2 mg  Oral QHS  . azithromycin  250 mg Oral Daily  . budesonide-formoterol  2 puff Inhalation BID  . busPIRone      . docusate sodium      . docusate sodium  100 mg Oral BID  . enoxaparin      . enoxaparin  40 mg Subcutaneous QHS  . escitalopram  10 mg Oral Daily  . insulin aspart  0-20 Units Subcutaneous TID WC  . insulin aspart  0-5 Units Subcutaneous QHS  . lisinopril  40 mg Oral Daily  . meloxicam  15 mg Oral Daily  . methylPREDNISolone sodium succinate      . multivitamins ther. w/minerals  1 tablet Oral Daily  . oxyCODONE      . pantoprazole  40 mg Oral Q1200  . predniSONE  40 mg Oral BID  . roflumilast  500 mcg Oral Daily  . simvastatin  40 mg Oral q1800  . sodium chloride      . tiotropium  18 mcg Inhalation Daily  . traZODone  50 mg Oral QHS  . DISCONTD: azithromycin  500 mg Intravenous Q24H  . DISCONTD: busPIRone  15 mg Oral BID  . DISCONTD: cefTRIAXone (ROCEPHIN)  IV  1 g Intravenous Q24H  . DISCONTD: methylPREDNISolone (SOLU-MEDROL) injection  80 mg  Intravenous Q6H  . DISCONTD: PARoxetine  40 mg Oral Daily   Continuous Infusions:    . DISCONTD: sodium chloride 75 mL/hr at 07/01/11 0216   PRN Meds:.acetaminophen, albuterol, ALPRAZolam, ondansetron (ZOFRAN) IV, ondansetron, oxyCODONE, senna, senna-docusate, DISCONTD: acetaminophen  Assessment/Plan: Principal Problem:  *COPD with exacerbation: Improved. Oral antibiotics and steroids have been changed to by mouth.   Suicidal ideations: The tele psychiatrist recommended inpatient psychiatric treatment. Bed availability is pending. I believe he will be medically stable for transfer tomorrow.   DM type 2 (diabetes mellitus, type 2). His capillary blood glucose has decreased significantly on oral steroids. We'll therefore reduce the sliding scale to sensitive.   HTN (hypertension): His blood pressure is better overall over the past 24 hours.   Dysphagia: Esophagram unremarkable. Speech therapist recommended a regular diet with thin liquids.   Depression. Per Dr. Pincus Sanes note, Paxil and BuSpar have been discontinued. Lexapro, Abilify, and trazodone have been added per the recommendation by the tele psychiatrist. See above.   Anxiety. Xanax has been resumed.   DDD (degenerative disc disease)   BPH (benign prostatic hyperplasia)   Tobacco abuse: He has cut down quite a bit recently.  Anemia: Anemia panel is unremarkable. He may have anemia of chronic disease.  Hyponatremia improved. TSH is within normal limits. 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 Will check his serum sodium and uric acid level in the morning.   LOS: 2 days   Aijah Lattner 07/02/2011, 12:41 PM   .

## 2011-07-02 NOTE — Progress Notes (Signed)
CARE MANAGEMENT NOTE 07/02/2011  Patient:  [name], [name]   Account Number:  [id number]  Date Initiated:  07/02/2011  Documentation initiated by:  Rosemary Holms  Subjective/Objective Assessment:   Pt admitted with COPD exacerbation.     Action/Plan:   PTA, lived alone. Possible suicidal   Anticipated DC Date:  07/04/2011   Anticipated [facility] Plan[facility]      DC Planning Services  CM consult      Choice offered to / List presented to:             Status of service:  In process, will continue to follow Medicare Important Message given?   (If response is "NO", the following Medicare IM given date fields will be blank) Date Medicare IM given:   Date Additional Medicare IM given:    Discharge Disposition:    Per UR Regulation:    Comments:  07/02/11 1030 Mirenda Baltazar RN BSN CM Spoke with pt at bedside. [facility] Pt [facility] transfer to [place] due to coverage with [employer]. CM to follow

## 2011-07-02 NOTE — Progress Notes (Signed)
CSW received referral for possible ALF.  However, tele-psych is recommending inpatient treatment at d/c.  ACT team is working with pt.  [name]

## 2011-07-02 NOTE — Progress Notes (Signed)
Utilization review completed.  

## 2011-07-03 LAB — BASIC METABOLIC PANEL
BUN: 17 mg/dL (ref 6–23)
CO2: 27 mEq/L (ref 19–32)
Calcium: 9 mg/dL (ref 8.4–10.5)
Chloride: 102 mEq/L (ref 96–112)
Creatinine, Ser: 1.07 mg/dL (ref 0.50–1.35)
Glucose, Bld: 130 mg/dL — ABNORMAL HIGH (ref 70–99)

## 2011-07-03 LAB — GLUCOSE, CAPILLARY
Glucose-Capillary: 120 mg/dL — ABNORMAL HIGH (ref 70–99)
Glucose-Capillary: 73 mg/dL (ref 70–99)
Glucose-Capillary: 148 mg/dL — ABNORMAL HIGH (ref 70–99)
Glucose-Capillary: 143 mg/dL — ABNORMAL HIGH (ref 70–99)

## 2011-07-03 MED ORDER — GUAIFENESIN-DM 100-10 MG/5ML PO SYRP
5.0000 mL | ORAL_SOLUTION | ORAL | Status: DC | PRN
  Administered 2011-07-03: 5 mL via ORAL
  Filled 2011-07-03: qty 5

## 2011-07-03 MED ORDER — BENZONATATE 100 MG PO CAPS: 100.0000 mg | ORAL_CAPSULE | Freq: Three times a day (TID) | ORAL | Status: DC | PRN

## 2011-07-03 NOTE — Progress Notes (Signed)
Pt now requesting transfer to [facility] hospital.  CM faxed information for admission and awaiting psych bed.  MD notified.  [name]

## 2011-07-03 NOTE — Progress Notes (Signed)
[facility] Notified linda hunt rn [phone] ex [phone] Faxed appropriate information to her. Pt completed financial form and request form to be transferred. Cm waiting on available bed. Discussed with Dr.Fisher.

## 2011-07-03 NOTE — Progress Notes (Signed)
CM informed by joseph at [facility] that they have no psychiatric facility that can accept pt due to pt being on oxygen. [facility]m was [facility] and was unable to reach her. Discussed with Dr. Sherrie Mustache who reordered act team consult for possible transfer to behavioral health.

## 2011-07-03 NOTE — Progress Notes (Signed)
ACT team paged at 1700 07/03/11.

## 2011-07-03 NOTE — Progress Notes (Signed)
Chart reviewed.  Subjective: He has no new complaints. He is awaiting discharge.   Objective: Vital signs in last 24 hours: Filed Vitals:   07/03/11 0624 07/03/11 0738 07/03/11 1416 07/03/11 1419  BP: 150/69  162/66   Pulse: 76  92   Temp: 98.5 F (36.9 C)  97.5 F (36.4 C)   TempSrc: Oral  Oral   Resp: 20  22   Height:      Weight:      SpO2: 100% 98% 100% 99%   Weight change:   Intake/Output Summary (Last 24 hours) at 07/03/11 1606 Last data filed at 07/03/11 0846  Gross per 24 hour  Intake    880 ml  Output   1300 ml  Net   -420 ml   Physical Exam: General: Comfortable. Lungs: Only occasional wheezing auscultated, significantly decreased from yesterday. Cardiovascular regular rate rhythm without murmurs gallops rubs Abdomen soft nontender nondistended Extremities no clubbing cyanosis or edema Psychiatric: Good eye contact. Flat affect. Speech is clear.  Lab Results: Basic Metabolic Panel:  Lab 07/03/11 0454 07/01/11 0742  NA 134* 130*  K 4.6 4.5  CL 102 99  CO2 27 22  GLUCOSE 130* 229*  BUN 17 16  CREATININE 1.07 0.89  CALCIUM 9.0 9.2  MG -- --  PHOS -- --   Liver Function Tests:  Lab 07/01/11 0742 06/30/11 1636  AST 11 15  ALT 10 12  ALKPHOS 95 88  BILITOT 0.1* 0.1*  PROT 6.6 7.1  ALBUMIN 3.2* 3.6   No results found for this basename: LIPASE:2,AMYLASE:2 in the last 168 hours No results found for this basename: AMMONIA:2 in the last 168 hours CBC:  Lab 07/01/11 0742 06/30/11 1636  WBC 7.5 7.9  NEUTROABS -- 4.5  HGB 10.2* 10.8*  HCT 29.5* 31.0*  MCV 90.8 90.4  PLT 255 287   Cardiac Enzymes:  Lab 07/01/11 1513 07/01/11 0742 07/01/11 0041  CKTOTAL 70 59 65  CKMB 2.6 2.3 2.4  CKMBINDEX -- -- --  TROPONINI <0.30 <0.30 <0.30   BNP: No results found for this basename: POCBNP:3 in the last 168 hours D-Dimer:  Lab 06/30/11 2147  DDIMER 0.86*   CBG:  Lab 07/03/11 1154 07/03/11 0738 07/02/11 2116 07/02/11 1601 07/02/11 1115 07/02/11  0724  GLUCAP 73 120* 145* 159* 119* 124*   Hemoglobin A1C:  Lab 07/01/11 0041  HGBA1C 5.4   Fasting Lipid Panel: No results found for this basename: CHOL,HDL,LDLCALC,TRIG,CHOLHDL,LDLDIRECT in the last 098 hours Thyroid Function Tests:  Lab 07/01/11 0041  TSH 1.577  T4TOTAL --  FREET4 --  T3FREE --  THYROIDAB --   Coagulation: No results found for this basename: LABPROT:4,INR:4 in the last 168 hours Anemia Panel:  Lab 07/01/11 0742  VITAMINB12 373  FOLATE 6.8  FERRITIN 153  TIBC 361  IRON 111  RETICCTPCT 1.4   Urine Drug Screen: Drugs of Abuse     Component Value Date/Time   LABOPIA NONE DETECTED 06/30/2011 1945   COCAINSCRNUR NONE DETECTED 06/30/2011 1945   LABBENZ NONE DETECTED 06/30/2011 1945   AMPHETMU NONE DETECTED 06/30/2011 1945   THCU NONE DETECTED 06/30/2011 1945   LABBARB NONE DETECTED 06/30/2011 1945    Micro Results: No results found for this or any previous visit (from the past 240 hour(s)). Studies/Results: No results found. Scheduled Meds:    . albuterol  2.5 mg Nebulization Q6H  . ARIPiprazole  2 mg Oral QHS  . azithromycin  250 mg Oral Daily  . budesonide-formoterol  2 puff Inhalation BID  . docusate sodium  100 mg Oral BID  . enoxaparin  40 mg Subcutaneous QHS  . escitalopram  10 mg Oral Daily  . insulin aspart  0-5 Units Subcutaneous QHS  . insulin aspart  0-9 Units Subcutaneous TID WC  . lisinopril  40 mg Oral Daily  . meloxicam  15 mg Oral Daily  . multivitamins ther. w/minerals  1 tablet Oral Daily  . pantoprazole  40 mg Oral Q1200  . predniSONE  40 mg Oral BID  . roflumilast  500 mcg Oral Daily  . simvastatin  40 mg Oral q1800  . sodium chloride      . tiotropium  18 mcg Inhalation Daily  . traZODone  50 mg Oral QHS   Continuous Infusions:   PRN Meds:.acetaminophen, albuterol, ALPRAZolam, benzonatate, guaiFENesin-dextromethorphan, ondansetron (ZOFRAN) IV, ondansetron, oxyCODONE, senna,  senna-docusate  Assessment/Plan: Principal Problem:  *COPD with exacerbation: Improved. Oral antibiotics and steroids have been changed to by mouth. We will decrease the prednisone to 30 mg twice a day in the morning.   Suicidal ideations: The tele psychiatrist recommended inpatient psychiatric treatment. The patient desires to go to the [facility] in [place]. A psychiatric bed is currently pending. He is medically stable for discharge.   DM type 2 (diabetes mellitus, type 2). His capillary blood glucose has decreased significantly on oral steroids. The sliding-scale insulin has been changed to the sensitive scale. We'll discontinue each bedtime insulin given that his blood sugars have markedly improved.   HTN (hypertension): His blood pressure is starting to trend upward. We'll consider adding hydrochlorothiazide.   Dysphagia: Esophagram unremarkable. Speech therapist recommended a regular diet with thin liquids.   Depression. Per Dr. Pincus Sanes note, Paxil and BuSpar have been discontinued. Lexapro, Abilify, and trazodone have been added per the recommendation by the tele psychiatrist. See above.   Anxiety. Xanax has been resumed.   DDD (degenerative disc disease)   BPH (benign prostatic hyperplasia)   Tobacco abuse: He has cut down quite a bit recently.  Anemia: Anemia panel is unremarkable. He may have anemia of chronic disease.  Hyponatremia, much improved. TSH is within normal limits. His uric acid level is within normal limits and it is less likely that he has SIADH.   LOS: 3 days   [name] 07/03/2011, 4:06 PM   .

## 2011-07-04 ENCOUNTER — Encounter (HOSPITAL_COMMUNITY): Payer: Self-pay | Admitting: Internal Medicine

## 2011-07-04 DIAGNOSIS — K224 Dyskinesia of esophagus: Secondary | ICD-10-CM

## 2011-07-04 HISTORY — DX: Dyskinesia of esophagus: K22.4

## 2011-07-04 LAB — GLUCOSE, CAPILLARY: Glucose-Capillary: 81 mg/dL (ref 70–99)

## 2011-07-04 MED ORDER — ALBUTEROL SULFATE HFA 108 (90 BASE) MCG/ACT IN AERS: 2.0000 | INHALATION_SPRAY | Freq: Four times a day (QID) | RESPIRATORY_TRACT | Status: DC | PRN

## 2011-07-04 MED ORDER — ARIPIPRAZOLE 2 MG PO TABS: 2.0000 mg | ORAL_TABLET | Freq: Every day | ORAL | Status: DC

## 2011-07-04 MED ORDER — ESCITALOPRAM OXALATE 10 MG PO TABS: 10.0000 mg | ORAL_TABLET | Freq: Every day | ORAL | Status: AC

## 2011-07-04 MED ORDER — PREDNISONE 20 MG PO TABS
30.0000 mg | ORAL_TABLET | Freq: Two times a day (BID) | ORAL | Status: DC
  Administered 2011-07-04: 30 mg via ORAL
  Filled 2011-07-04: qty 1

## 2011-07-04 MED ORDER — BENZONATATE 100 MG PO CAPS: 100.0000 mg | ORAL_CAPSULE | Freq: Three times a day (TID) | ORAL | Status: AC | PRN

## 2011-07-04 MED ORDER — PREDNISONE 10 MG PO TABS: ORAL_TABLET | ORAL | Status: DC

## 2011-07-04 MED ORDER — BUDESONIDE-FORMOTEROL FUMARATE 160-4.5 MCG/ACT IN AERO: 2.0000 | INHALATION_SPRAY | Freq: Two times a day (BID) | RESPIRATORY_TRACT | Status: DC

## 2011-07-04 MED ORDER — TRAZODONE HCL 50 MG PO TABS: 50.0000 mg | ORAL_TABLET | Freq: Every day | ORAL | Status: DC

## 2011-07-04 NOTE — Progress Notes (Signed)
Subjective: He has no new complaints. He is awaiting discharge.   Objective: Vital signs in last 24 hours: Filed Vitals:   07/03/11 2118 07/04/11 0516 07/04/11 0753 07/04/11 0805  BP: 160/66 155/62    Pulse: 98 87 89 90  Temp: 97.6 F (36.4 C) 98.1 F (36.7 C)    TempSrc: Oral Oral    Resp: 24 16 20 20   Height:      Weight:  63.141 kg (139 lb 3.2 oz)    SpO2: 98% 99% 98%    Weight change:   Intake/Output Summary (Last 24 hours) at 07/04/11 1057 Last data filed at 07/04/11 0900  Gross per 24 hour  Intake    820 ml  Output    475 ml  Net    345 ml   Physical Exam: General: Comfortable. Lungs: Only occasional wheezing auscultated, significantly decreased from yesterday. Cardiovascular regular rate rhythm without murmurs gallops rubs Abdomen soft nontender nondistended Extremities no clubbing cyanosis or edema Psychiatric: Good eye contact. Flat affect. Speech is clear. He denies suicidal thoughts at this time. Overall, he feels better on the new medications started.[name] He says that he has a plastic gun at home and" it is not real".  Lab Results: Basic Metabolic Panel:  Lab 07/03/11 1610 07/01/11 0742  NA 134* 130*  K 4.6 4.5  CL 102 99  CO2 27 22  GLUCOSE 130* 229*  BUN 17 16  CREATININE 1.07 0.89  CALCIUM 9.0 9.2  MG -- --  PHOS -- --   Liver Function Tests:  Lab 07/01/11 0742 06/30/11 1636  AST 11 15  ALT 10 12  ALKPHOS 95 88  BILITOT 0.1* 0.1*  PROT 6.6 7.1  ALBUMIN 3.2* 3.6   No results found for this basename: LIPASE:2,AMYLASE:2 in the last 168 hours No results found for this basename: AMMONIA:2 in the last 168 hours CBC:  Lab 07/01/11 0742 06/30/11 1636  WBC 7.5 7.9  NEUTROABS -- 4.5  HGB 10.2* 10.8*  HCT 29.5* 31.0*  MCV 90.8 90.4  PLT 255 287   Cardiac Enzymes:  Lab 07/01/11 1513 07/01/11 0742 07/01/11 0041  CKTOTAL 70 59 65  CKMB 2.6 2.3 2.4  CKMBINDEX -- -- --  TROPONINI <0.30 <0.30 <0.30   BNP: No results found for this  basename: POCBNP:3 in the last 168 hours D-Dimer:  Lab 06/30/11 2147  DDIMER 0.86*   CBG:  Lab 07/04/11 0715 07/03/11 2036 07/03/11 1643 07/03/11 1154 07/03/11 0738 07/02/11 2116  GLUCAP 123* 143* 148* 73 120* 145*   Hemoglobin A1C:  Lab 07/01/11 0041  HGBA1C 5.4   Fasting Lipid Panel: No results found for this basename: CHOL,HDL,LDLCALC,TRIG,CHOLHDL,LDLDIRECT in the last 960 hours Thyroid Function Tests:  Lab 07/01/11 0041  TSH 1.577  T4TOTAL --  FREET4 --  T3FREE --  THYROIDAB --   Coagulation: No results found for this basename: LABPROT:4,INR:4 in the last 168 hours Anemia Panel:  Lab 07/01/11 0742  VITAMINB12 373  FOLATE 6.8  FERRITIN 153  TIBC 361  IRON 111  RETICCTPCT 1.4   Urine Drug Screen: Drugs of Abuse     Component Value Date/Time   LABOPIA NONE DETECTED 06/30/2011 1945   COCAINSCRNUR NONE DETECTED 06/30/2011 1945   LABBENZ NONE DETECTED 06/30/2011 1945   AMPHETMU NONE DETECTED 06/30/2011 1945   THCU NONE DETECTED 06/30/2011 1945   LABBARB NONE DETECTED 06/30/2011 1945    Micro Results: No results found for this or any previous visit (from the past 240 hour(s)).  Studies/Results: No results found. Scheduled Meds:    . albuterol  2.5 mg Nebulization Q6H  . ARIPiprazole  2 mg Oral QHS  . azithromycin  250 mg Oral Daily  . budesonide-formoterol  2 puff Inhalation BID  . docusate sodium  100 mg Oral BID  . enoxaparin  40 mg Subcutaneous QHS  . escitalopram  10 mg Oral Daily  . insulin aspart  0-5 Units Subcutaneous QHS  . lisinopril  40 mg Oral Daily  . meloxicam  15 mg Oral Daily  . multivitamins ther. w/minerals  1 tablet Oral Daily  . pantoprazole  40 mg Oral Q1200  . predniSONE  30 mg Oral BID  . roflumilast  500 mcg Oral Daily  . simvastatin  40 mg Oral q1800  . tiotropium  18 mcg Inhalation Daily  . traZODone  50 mg Oral QHS  . DISCONTD: insulin aspart  0-9 Units Subcutaneous TID WC  . DISCONTD: predniSONE  40 mg Oral BID    Continuous Infusions:   PRN Meds:.acetaminophen, albuterol, ALPRAZolam, benzonatate, guaiFENesin-dextromethorphan, ondansetron (ZOFRAN) IV, ondansetron, oxyCODONE, senna, senna-docusate  Assessment/Plan:  I believe the patient is no longer suicidal. He regrets mentioning begun that he has at home. He says that the gun is plastic and it is not real. He believes he is improving on the new psychiatric medication started. I'll ask the psychiatrist to reevaluate the patient. Ultimately, long-term, he does need to have therapy and a psychiatrist who will follow him. He is very interested in getting in an assisted living facility with the [facility].  We will await the reevaluation by the psychiatrist or the ACT team. Otherwise, he is medically and hemodynamically stable enough for discharge today.    LOS: 4 days   Cornie Mccomber 07/04/2011, 10:57 AM   .

## 2011-07-04 NOTE — Progress Notes (Signed)
Pt is for d/c home this afternoon. CM contacted harold at [facility] for followup appointment with psychiatrist . Per Jake Shark pt will be seen by  Dr. Margot Ables at the [facility] facility. A RN  From the [facility] will call the patient at 8am on Monday morning to discuss the patients d/c instructions and give him the appointment date and time to see the MD in one week.   Explained to Mr. Alicea the above and he voiced understanding of same. ALso, arrange for portable o2 tank to be delivered to patients room to use on the way home.SW is arranged  For ride home. D/C summary to be faxed to [phone] to the MD.

## 2011-07-04 NOTE — BH Assessment (Signed)
Assessment Note   [name] is an 70 y.o. male.  PT WAS SEEN IN THE ER FOR BREATHING PXS AND THEN ADMITTED TO THE MEDICAL FLOOR WHERE HE IS CURRENTLY MEDICALLY CLEARED. DURING HIS ER VISIT PT REPORTED HE FELT LIKE HURTING HIMSELF. UPON TALKING TO PT IT WAS LEARNED THAT PT HAS NO INTENTIONS TO HURT HIMSELF BUT WAS DEPRESSED DUE TO HIS MEDICAL CONDITIONS AND WAS FEELING HOPELESS. A TELE-PSYCH WAS ORDERED AND PSYCHIATRIST  AIBY PENAIVER 'S RECOMMENDATION WAS TO DISCHARGE PT TO HOME.  PT REPORTS HAVING TWO SONS WHO BOTH LIVE CLOSE BY AND THEY ARE SUPPORTIVE AND COME TO CHECK ON HIM FREQUENTLY. PT CONTRACTS FOR SAFETY AND AGREES TO FOLLOW UP WITH [facility] FOR HIS DEPRESSION. DISCUSSED DISPOSITION WITH ATTENDING, DR Sherrie Mustache WHO AGREES WITH DISPOSITION.          Axis I: Depressive Disorder NOS Axis II: Deferred Axis III:  Past Medical History  Diagnosis Date  . HTN (hypertension)   . Alcohol abuse   . PTSD (post-traumatic stress disorder)   . Chronic back pain   . Depression   . Peripheral neuropathy   . DJD (degenerative joint disease)   . COPD (chronic obstructive pulmonary disease)   . Anemia   . Myocardial infarction   . Type II or unspecified type diabetes mellitus without mention of complication, not stated as uncontrolled    Axis IV: problems with access to health care services Axis V: 61-70 mild symptoms  Past Medical History:  Past Medical History  Diagnosis Date  . HTN (hypertension)   . Alcohol abuse   . PTSD (post-traumatic stress disorder)   . Chronic back pain   . Depression   . Peripheral neuropathy   . DJD (degenerative joint disease)   . COPD (chronic obstructive pulmonary disease)   . Anemia   . Myocardial infarction   . Type II or unspecified type diabetes mellitus without mention of complication, not stated as uncontrolled     Past Surgical History  Procedure Date  . Hernia repair     X 3  . Spinal surgeries     X 4  . Appendectomy   . Prostate  surgery     Family History:  Family History  Problem Relation Age of Onset  . Lung disease Mother   . Prostate cancer Father     Social History:  reports that he has been smoking.  He does not have any smokeless tobacco history on file. He reports that he drinks alcohol. He reports that he does not use illicit drugs.  Allergies: No Known Allergies  Home Medications:  Medications Prior to Admission  Medication Dose Route Frequency Provider Last Rate Last Dose  . 0.9 %  sodium chloride infusion   Intravenous Once Toy Baker, MD 125 mL/hr at 06/30/11 1656    . acetaminophen (TYLENOL) tablet 650 mg  650 mg Oral Q6H PRN Gokul Krishnan   650 mg at 07/04/11 0415  . albuterol (PROVENTIL) (5 MG/ML) 0.5% nebulizer solution 2.5 mg  2.5 mg Nebulization Q6H Gokul Krishnan   2.5 mg at 07/04/11 1414  . albuterol (PROVENTIL) (5 MG/ML) 0.5% nebulizer solution 2.5 mg  2.5 mg Nebulization Q2H PRN Osvaldo Shipper      . albuterol (PROVENTIL) (5 MG/ML) 0.5% nebulizer solution           . albuterol (PROVENTIL) (5 MG/ML) 0.5% nebulizer solution           . albuterol (PROVENTIL, VENTOLIN) (5 MG/ML) 0.5%  continuous inhalation solution           . albuterol (PROVENTIL,VENTOLIN) solution continuous neb  10 mg/hr Nebulization Once Toy Baker, MD   10 mg/hr at 06/30/11 1912  . ALPRAZolam Prudy Feeler) tablet 0.25 mg  0.25 mg Oral TID PRN Osvaldo Shipper      . ARIPiprazole (ABILIFY) tablet 2 mg  2 mg Oral QHS Corinna L Sullivan   2 mg at 07/03/11 2134  . azithromycin (ZITHROMAX) tablet 250 mg  250 mg Oral Daily Corinna L Sullivan   250 mg at 07/04/11 1191  . benzonatate (TESSALON) capsule 100 mg  100 mg Oral TID PRN Elliot Cousin      . budesonide-formoterol (SYMBICORT) 160-4.5 MCG/ACT inhaler 2 puff  2 puff Inhalation BID Gokul Krishnan   2 puff at 07/04/11 0805  . busPIRone (BUSPAR) 5 MG tablet           . docusate sodium (COLACE) 100 MG capsule           . docusate sodium (COLACE) capsule 100 mg  100 mg Oral  BID Gokul Krishnan   100 mg at 07/04/11 0958  . enoxaparin (LOVENOX) 40 MG/0.4ML injection           . enoxaparin (LOVENOX) injection 40 mg  40 mg Subcutaneous QHS Gokul Krishnan   40 mg at 07/03/11 2135  . escitalopram (LEXAPRO) tablet 10 mg  10 mg Oral Daily Corinna L Sullivan   10 mg at 07/04/11 0958  . guaiFENesin-dextromethorphan (ROBITUSSIN DM) 100-10 MG/5ML syrup 5 mL  5 mL Oral Q4H PRN Elliot Cousin   5 mL at 07/03/11 1536  . insulin aspart (novoLOG) injection 0-5 Units  0-5 Units Subcutaneous QHS Elliot Cousin      . iohexol (OMNIPAQUE) 350 MG/ML injection 100 mL  100 mL Intravenous Once PRN Medication Radiologist   100 mL at 07/01/11 0046  . lisinopril (PRINIVIL,ZESTRIL) tablet 40 mg  40 mg Oral Daily Gokul Krishnan   40 mg at 07/04/11 0958  . meloxicam (MOBIC) tablet 15 mg  15 mg Oral Daily Allie Bossier [place], PHARMD   15 mg at 07/04/11 4782  . methylPREDNISolone sodium succinate (SOLU-MEDROL) 125 MG injection 125 mg  125 mg Intravenous Once Toy Baker, MD   125 mg at 06/30/11 1808  . methylPREDNISolone sodium succinate (SOLU-MEDROL) 125 MG injection           . morphine 4 MG/ML injection 4 mg  4 mg Intravenous Once Toy Baker, MD   4 mg at 06/30/11 1959  . multivitamins ther. w/minerals tablet 1 tablet  1 tablet Oral Daily Gokul Krishnan   1 tablet at 07/04/11 0958  . ondansetron (ZOFRAN) tablet 4 mg  4 mg Oral Q6H PRN Gokul Krishnan       Or  . ondansetron (ZOFRAN) injection 4 mg  4 mg Intravenous Q6H PRN Gokul Krishnan      . oxyCODONE (Oxy IR/ROXICODONE) 5 MG immediate release tablet           . oxyCODONE (Oxy IR/ROXICODONE) immediate release tablet 5 mg  5 mg Oral Q6H PRN Gokul Krishnan   5 mg at 07/04/11 0816  . pantoprazole (PROTONIX) EC tablet 40 mg  40 mg Oral Q1200 Corinna L Sullivan   40 mg at 07/04/11 1115  . predniSONE (DELTASONE) tablet 30 mg  30 mg Oral BID Elliot Cousin   30 mg at 07/04/11 9562  . roflumilast (DALIRESP) tablet 500 mcg  500 mcg Oral Daily Gokul  Krishnan   500 mcg at 07/04/11 0958  . senna (SENOKOT) tablet 17.2 mg  2 tablet Oral Daily PRN Gokul Krishnan      . senna-docusate (Senokot-S) tablet 1 tablet  1 tablet Oral Daily PRN Gokul Krishnan      . simvastatin (ZOCOR) tablet 40 mg  40 mg Oral q1800 Gokul Krishnan   40 mg at 07/03/11 1708  . sodium chloride 0.9 % injection           . sodium chloride 0.9 % nebulizer solution           . tiotropium (SPIRIVA) inhalation capsule 18 mcg  18 mcg Inhalation Daily Gokul Krishnan   18 mcg at 07/04/11 0804  . traZODone (DESYREL) tablet 50 mg  50 mg Oral QHS Corinna L Sullivan   50 mg at 07/03/11 2135  . DISCONTD: 0.9 %  sodium chloride infusion   Intravenous Continuous Gokul Krishnan 75 mL/hr at 07/01/11 0216    . DISCONTD: acetaminophen (TYLENOL) suppository 650 mg  650 mg Rectal Q6H PRN Osvaldo Shipper      . DISCONTD: azithromycin (ZITHROMAX) 500 mg in dextrose 5 % 250 mL IVPB  500 mg Intravenous Q24H Toy Baker, MD 250 mL/hr at 07/01/11 1804 500 mg at 07/01/11 1804  . DISCONTD: busPIRone (BUSPAR) tablet 15 mg  15 mg Oral BID Gokul Krishnan   15 mg at 07/01/11 0930  . DISCONTD: cefTRIAXone (ROCEPHIN) 1 g in dextrose 5 % 50 mL IVPB  1 g Intravenous Q24H Toy Baker, MD   1 g at 06/30/11 2107  . DISCONTD: insulin aspart (novoLOG) injection 0-20 Units  0-20 Units Subcutaneous TID WC Gokul Krishnan   3 Units at 07/02/11 0817  . DISCONTD: insulin aspart (novoLOG) injection 0-5 Units  0-5 Units Subcutaneous QHS Gokul Krishnan      . DISCONTD: insulin aspart (novoLOG) injection 0-9 Units  0-9 Units Subcutaneous TID WC Elliot Cousin   2 Units at 07/02/11 1719  . DISCONTD: methylPREDNISolone sodium succinate (SOLU-MEDROL) 125 MG injection 80 mg  80 mg Intravenous Q6H Gokul Krishnan   80 mg at 07/01/11 1721  . DISCONTD: PARoxetine (PAXIL) tablet 40 mg  40 mg Oral Daily Gokul Krishnan   40 mg at 07/01/11 0930  . DISCONTD: predniSONE (DELTASONE) tablet 40 mg  40 mg Oral BID Corinna L Sullivan   40 mg  at 07/03/11 2135   Medications Prior to Admission  Medication Sig Dispense Refill  . ALPRAZolam (XANAX) 0.25 MG tablet Take 0.25 mg by mouth 3 (three) times daily as needed.       [name] lisinopril (PRINIVIL,ZESTRIL) 40 MG tablet Take 40 mg by mouth daily.        . metFORMIN (GLUCOPHAGE) 500 MG tablet Take 1,000 mg by mouth 2 (two) times daily with a meal.       . oxycodone (OXY-IR) 5 MG capsule Take 5 mg by mouth every 8 (eight) hours.        . traMADol (ULTRAM) 50 MG tablet Take 100 mg by mouth 3 (three) times daily as needed.       . pravastatin (PRAVACHOL) 80 MG tablet Take 80 mg by mouth daily.          OB/GYN Status:  No LMP for male patient.  General Assessment Data Assessment Number: 2  Living Arrangements: Alone Can pt return to current living arrangement?: Yes Admission Status: Voluntary Is patient capable of signing voluntary admission?: Yes Transfer from: Acute Hospital Referral Source: Other (  medical floor)  Risk to self Suicidal Ideation: No Suicidal Intent: No Is patient at risk for suicide?: No Suicidal Plan?: No Access to Means: No What has been your use of drugs/alcohol within the last 12 months?: alcohol Other Self Harm Risks: na Triggers for Past Attempts: None known Intentional Self Injurious Behavior: None Factors that decrease suicide risk: Positive therapeutic relationships Family Suicide History: No Recent stressful life event(s): Other (Comment) (health pxs) Persecutory voices/beliefs?: No Depression: Yes Depression Symptoms: Loss of interest in usual pleasures;Feeling worthless/self pity Substance abuse history and/or treatment for substance abuse?: Yes Suicide prevention information given to non-admitted patients: Yes  Risk to Others Homicidal Ideation: No Thoughts of Harm to Others: No Current Homicidal Intent: No Current Homicidal Plan: No Access to Homicidal Means: No Identified Victim: na History of harm to others?: No Assessment of  Violence: None Noted Violent Behavior Description: na Does patient have access to weapons?: No Criminal Charges Pending?: No Does patient have a court date: No  Mental Status Report Appear/Hygiene: Improved Eye Contact: Good Motor Activity: Freedom of movement Speech: Logical/coherent Level of Consciousness: Alert Mood: Depressed Affect: Appropriate to circumstance Anxiety Level: Minimal Thought Processes: Coherent;Relevant Judgement: Unimpaired Orientation: Person;Place;Time;Situation Obsessive Compulsive Thoughts/Behaviors: None  Cognitive Functioning Concentration: Normal Memory: Recent Intact;Remote Intact IQ: Average Insight: Good Impulse Control: Good Appetite: Fair Weight Loss: 0  Weight Gain: 0  Sleep: No Change Total Hours of Sleep: 8  Vegetative Symptoms: None  Prior Inpatient/Outpatient Therapy Prior Therapy: Inpatient Prior Therapy Dates: unk [facility] Reason for Treatment: depression  ADL Screening (condition at time of admission) Patient's cognitive ability adequate to safely complete daily activities?: Yes Patient able to express need for assistance with ADLs?: Yes Independently performs ADLs?: Yes Weakness of Legs: Both Weakness of Arms/Hands: None  Home Assistive Devices/Equipment Home Assistive Devices/Equipment: Oxygen;Cane;Walker (specify type)  Therapy Consults (therapy consults require a physician order) PT Evaluation Needed: Yes (Comment) OT Evalulation Needed: No SLP Evaluation Needed: No Abuse/Neglect Assessment (Assessment to be complete while patient is alone) Physical Abuse: Denies Verbal Abuse: Denies Sexual Abuse: Denies Exploitation of patient/patient's resources: Denies Self-Neglect: Denies Values / Beliefs Cultural Requests During Hospitalization: None Spiritual Requests During Hospitalization: None Consults Spiritual Care Consult Needed: Yes (Comment) Social Work Consult Needed:  No Merchant navy officer (For Healthcare) Advance Directive: Patient does not have advance directive Pre-existing out of facility DNR order (yellow form or pink MOST form): No Nutrition Screen Diet: Other (Comment) (dys 3) Unintentional weight loss greater than 10lbs within the last month: No Dysphagia: No Home Tube Feeding or Total Parenteral Nutrition (TPN): No Patient appears severely malnourished: No Pregnant or Lactating: No Dietitian Consult Needed: No  Additional Information 1:1 In Past 12 Months?: No CIRT Risk: No Elopement Risk: No Does patient have medical clearance?: Yes     Disposition:  Disposition Disposition of Patient: Referred to [facility]) Other disposition(s): To current provider Patient referred to: Other (Comment) [facility] )  On Site Evaluation by:   Reviewed with Physician:     Hattie Perch Winford 07/04/2011 3:10 PM

## 2011-07-04 NOTE — Progress Notes (Signed)
D/c instructions reviewed with patient. Verbalized understanding. Pt dc'd to home by [facility].  Schonewitz, Candelaria Stagers 07/04/2011

## 2011-07-04 NOTE — Progress Notes (Signed)
Pt cleared by telepsych today to d/c home.  [facility]  CSW arranged transport home via [facility].  [facility] to pick pt up at main entrance at 4:30 today.  RN aware.    Karn Cassis

## 2011-07-04 NOTE — Discharge Summary (Signed)
Physician Discharge Summary  [name] MRN: [id number] DOB/AGE: 12-Jul-1941 70 y.o.  PCP: No primary provider on file.   Admit date: 06/30/2011 Discharge date: 07/04/2011  Discharge Diagnoses:  1. Oxygen-dependent COPD with bronchitic exacerbation. Pleuritic chest pain secondary to COPD exacerbation. CT angiogram of the chest negative for PE. 2. Depression with suicidal ideation. Suicidal ideation resolved. 3. Subjective dysphagia with esophagram revealing minimal age-related esophageal dysmotility. Bedside swallow evaluation unremarkable per speech therapist. 4. Hypertension. It was likely that the patient's blood pressure was more elevated on IV steroid therapy. 5. Type 2 diabetes mellitus. Hemoglobin A1c 5.4. 6. Degenerative joint disease. 7. Chronic anxiety. 8. BPH. 9. Hyponatremia, nearly resolved. TSH was within normal limits at 1.5. Serum sodium was 127 on admission and 134 at the time of discharge. 10. Tobacco abuse.    Current Discharge Medication List    START taking these medications   Details  ARIPiprazole (ABILIFY) 2 MG tablet Take 1 tablet (2 mg total) by mouth at bedtime. Qty: 30 tablet, Refills: 2    benzonatate (TESSALON) 100 MG capsule Take 1 capsule (100 mg total) by mouth 3 (three) times daily as needed for cough. Qty: 20 capsule, Refills: 2    budesonide-formoterol (SYMBICORT) 160-4.5 MCG/ACT inhaler Inhale 2 puffs into the lungs 2 (two) times daily. Qty: 1 Inhaler, Refills: 2    escitalopram (LEXAPRO) 10 MG tablet Take 1 tablet (10 mg total) by mouth daily. Qty: 30 tablet, Refills: 2    predniSONE (DELTASONE) 10 MG tablet TAKE 5 TABLETS STARTING TOMORROW FOR 1 DAY; THEN 4 TABLETS THE NEXT DAY; THEN 3 TABLETS THE NEXT DAY; THEN 2 TABLETS THE NEXT DAY; THEN 1 TABLET THE NEXT DAY; THEN STOP. Qty: 15 tablet, Refills: 0    traZODone (DESYREL) 50 MG tablet Take 1 tablet (50 mg total) by mouth at bedtime. Qty: 30 tablet, Refills: 2      CONTINUE  these medications which have CHANGED   Details  albuterol (PROVENTIL HFA) 108 (90 BASE) MCG/ACT inhaler Inhale 2 puffs into the lungs 4 (four) times daily as needed. For COPD Qty: 1 Inhaler, Refills: 2      CONTINUE these medications which have NOT CHANGED   Details  ALPRAZolam (XANAX) 0.25 MG tablet Take 0.25 mg by mouth 3 (three) times daily as needed.     lisinopril (PRINIVIL,ZESTRIL) 40 MG tablet Take 40 mg by mouth daily.      metFORMIN (GLUCOPHAGE) 500 MG tablet Take 1,000 mg by mouth 2 (two) times daily with a meal.     oxycodone (OXY-IR) 5 MG capsule Take 5 mg by mouth every 8 (eight) hours.      roflumilast (DALIRESP) 500 MCG TABS tablet Take 500 mcg by mouth daily.      sildenafil (VIAGRA) 50 MG tablet Take 50 mg by mouth daily as needed. For intercourse     tiotropium (SPIRIVA) 18 MCG inhalation capsule Place 18 mcg into inhaler and inhale daily.      meloxicam (MOBIC) 15 MG tablet Take 15 mg by mouth daily.      pravastatin (PRAVACHOL) 80 MG tablet Take 80 mg by mouth daily.      sodium chloride (ALTAMIST SPRAY) 0.65 % nasal spray Place 1 spray into the nose as needed. For nasal congestion       STOP taking these medications     busPIRone (BUSPAR) 15 MG tablet      traMADol (ULTRAM) 50 MG tablet      PARoxetine (PAXIL)  40 MG tablet         Discharge Condition: Improved.  Disposition: Home.    Consults: [facility].   Significant Diagnostic Studies: Dg Chest 2 View  06/30/2011  *RADIOLOGY REPORT*  Clinical Data: Chest pain.  CHEST - 2 VIEW  Comparison: 04/23/2009  Findings: Heart size is normal.  The lungs are free of focal consolidations and pleural effusions.  No edema.  There is minimal left lower lobe atelectasis.  Degenerative changes are seen in the spine.  IMPRESSION:  1.  Minimal left lower lobe atelectasis. 2. No evidence for acute cardiopulmonary abnormality.  Original Report Authenticated By: Patterson Hammersmith, M.D.    Ct Angio Chest W/cm &/or Wo Cm  07/01/2011  *RADIOLOGY REPORT*  Clinical Data:  Chest pain and shortness of breath.  CT ANGIOGRAPHY CHEST WITH CONTRAST  Technique:  Multidetector CT imaging of the chest was performed using the standard protocol during bolus administration of intravenous contrast.  Multiplanar CT image reconstructions including MIPs were obtained to evaluate the vascular anatomy.  Contrast: OMNIPAQUE IOHEXOL 350 MG/ML IV SOLN  Comparison:  None  Findings:  The pulmonary arteries are well opacified.  There is no evidence of acute pulmonary embolism.  The thoracic aorta is also opacified with contrast and demonstrates no evidence of aneurysmal disease or dissection.  The heart size is normal.  No pleural or pericardial fluid identified.  Visible calcified plaque is present at the level of the proximal LAD and potentially also the left main coronary artery.  Lungs show moderate emphysematous disease with multiple bullae present in both lungs.  There is no evidence of pulmonary mass, infiltrate or edema.  No enlarged lymph nodes are identified.  Bony structures show mild compression of the T8 vertebral body. This vertebral body shows evidence of some degree of sclerosis and approximately 25 - 30% loss of anterior height.  No other sclerotic bony lesions are identified in the chest and the relative sclerosis may simply be due to compression.  However, correlation is suggested with any history of malignancy.  This is visible on the lateral chest x-ray of 11/26.  It was not present by chest x-ray in 2010.  Review of the MIP images confirms the above findings.  IMPRESSION:  1.  No evidence of pulmonary embolism. 2.  Evidence of coronary atherosclerosis with calcified plaque present at the level of the proximal LAD and also potentially extending into the left main coronary artery. 3.  Significant emphysematous lung disease. 4.  Mild compression deformity of the T8 vertebral body which appears to  be new since 2010.  This vertebral body is partially sclerotic and pathologic fracture is not completely excluded.  Original Report Authenticated By: Reola Calkins, M.D.   Dg Esophagus  07/01/2011  *RADIOLOGY REPORT*  Clinical Data: Thoracic esophageal dysphagia  ESOPHOGRAM/BARIUM SWALLOW  Technique:  Single contrast examination was performed using thin barium.  Fluoroscopy time:  2.3 minutes.  Comparison:  None  Findings: Minimal age-related diffuse impairment of esophageal motility. No esophageal mass or stricture. 12.5 mm diameter barium tablet passes from oral cavity to stomach without obstruction. Atherosclerotic calcification aorta noted. Esophageal mucosa appears grossly smooth on single contrast images without definite areas of irregularity or ulceration. No persistent intraluminal filling defects, mass or obstruction identified. No hiatal hernia or gastroesophageal reflux seen. Minimal laryngeal penetration was transiently noted without evidence of aspiration.  IMPRESSION: Minimal laryngeal penetration without aspiration. Minimal age-related esophageal dysmotility. No evidence of esophageal mass or,  stricture, or obstruction.  Original Report Authenticated By: Lollie Marrow, M.D.     Microbiology: No results found for this or any previous visit (from the past 240 hour(s)).   Labs: Results for orders placed during the hospital encounter of 06/30/11 (from the past 48 hour(s))  GLUCOSE, CAPILLARY     Status: Abnormal   Collection Time   07/02/11  9:16 PM      Component Value Range Comment   Glucose-Capillary 145 (*) 70 - 99 (mg/dL)    Comment 1 Notify RN      Comment 2 Documented in Chart     BASIC METABOLIC PANEL     Status: Abnormal   Collection Time   07/03/11  5:46 AM      Component Value Range Comment   Sodium 134 (*) 135 - 145 (mEq/L)    Potassium 4.6  3.5 - 5.1 (mEq/L)    Chloride 102  96 - 112 (mEq/L)    CO2 27  19 - 32 (mEq/L)    Glucose, Bld 130 (*) 70 - 99 (mg/dL)     BUN 17  6 - 23 (mg/dL)    Creatinine, Ser 9.60  0.50 - 1.35 (mg/dL)    Calcium 9.0  8.4 - 10.5 (mg/dL)    GFR calc non Af Amer 69 (*) >90 (mL/min)    GFR calc Af Amer 80 (*) >90 (mL/min)   URIC ACID     Status: Normal   Collection Time   07/03/11  5:46 AM      Component Value Range Comment   Uric Acid, Serum 6.1  4.0 - 7.8 (mg/dL)   GLUCOSE, CAPILLARY     Status: Abnormal   Collection Time   07/03/11  7:38 AM      Component Value Range Comment   Glucose-Capillary 120 (*) 70 - 99 (mg/dL)   GLUCOSE, CAPILLARY     Status: Normal   Collection Time   07/03/11 11:54 AM      Component Value Range Comment   Glucose-Capillary 73  70 - 99 (mg/dL)   GLUCOSE, CAPILLARY     Status: Abnormal   Collection Time   07/03/11  4:43 PM      Component Value Range Comment   Glucose-Capillary 148 (*) 70 - 99 (mg/dL)   GLUCOSE, CAPILLARY     Status: Abnormal   Collection Time   07/03/11  8:36 PM      Component Value Range Comment   Glucose-Capillary 143 (*) 70 - 99 (mg/dL)    Comment 1 Notify RN     GLUCOSE, CAPILLARY     Status: Abnormal   Collection Time   07/04/11  7:15 AM      Component Value Range Comment   Glucose-Capillary 123 (*) 70 - 99 (mg/dL)    Comment 1 Notify RN     GLUCOSE, CAPILLARY     Status: Normal   Collection Time   07/04/11 11:04 AM      Component Value Range Comment   Glucose-Capillary 81  70 - 99 (mg/dL)    Comment 1 Notify RN        HPI : The patient is a 70 year old man with a past medical history significant for oxygen-dependent COPD, type 2 diabetes mellitus, and depression. He presented to the emergency department on 06/30/2011 with a chief complaint of shortness of breath and pleuritic chest pain. He also had suicidal ideation. In the emergency department, he was afebrile and hemodynamically stable. He was oxygenating 94%  on oxygen supplementation. His lab data were significant for a serum sodium of 127, hemoglobin of 10.8, normal troponin I., negative alcohol  level, and negative urine drug screen. His chest x-ray revealed no evidence for acute cardiopulmonary abnormality. His EKG revealed sinus tachycardia with a heart rate of 105 beats per minute, possible Q waves in V1 2 and 3, but no concerning ST or T wave changes. He was admitted for further evaluation and management.  HOSPITAL COURSE: The patient was started on supportive treatment and albuterol nebulizers, IV antibiotics, and Spiriva. He was also started on IV Solu-Medrol. Oxygen therapy was continued and titrated to keep his oxygen saturations greater than 88-90%. For further evaluation, a d-dimer was ordered. It was elevated. This prompted an order for a CT angiogram of his chest. The CT angiogram was negative for PE. His pleuritic chest pain was the consequence of exacerbation of COPD. His pain was treated appropriately with analgesics. Over the course of the hospitalization, he became less symptomatic with regards to his shortness of breath and bronchospasms. IV antibiotics were transitioned to by mouth. IV Solu-Medrol was discontinued in favor of prednisone. Tobacco cessation counseling was ordered and the patient was advised to stop smoking. He was maintained on oxygen therapy. At the time of discharge, the albuterol nebulizations were discontinued in favor of the albuterol inhaler. Also, Pulmicort was added to his bronchodilator therapy. He was discharged on a prednisone taper over the next 5 days.  The patient's diabetes control worsened on IV steroids. He was treated appropriately with sliding scale NovoLog and Lantus. As the steroids were titrated down, his glycemic control improved. His hemoglobin A1c was excellent at 5.4. He was advised to restart metformin as previously prescribed.  The patient was restarted on lisinopril when his blood pressure began to increase. In general, his systolic blood pressure ranged from 1:30 to 160. Adding hydrochlorothiazide was contemplated, however, it was not  started because it was believed that his elevated blood pressure was in part secondary to steroid therapy. Further management will be deferred to his primary care physician at the [facility].  The patient complained of subjective difficulty swallowing particularly with meats. An esophagram was ordered and it revealed minimal age related esophageal dysmotility. The speech therapist also evaluated the patient and did not detect dysphagia or aspiration. She made recommendations regarding strategies for the patient to reduce his feelings of dysphagia.  The patient expressed suicidal thoughts on admission which he had done in the past. He has a history of prior behavioral health admissions. A 24-hour sitter was ordered for safety. Tele- psychiatry was consulted. The psychiatrist recommended discontinuing Paxil and BuSpar and starting Lexapro, Abilify, and trazodone instead. He also recommended continuing Xanax as previously prescribed. He recommended inpatient psychiatric treatment. Therefore, an attempt was made to transfer the patient to an inpatient psychiatric facility in the [facility] health system or in the [place] health system. After several days, the physicians at the [facility] refused to accept the patient because he was treated with chronic oxygen and it was believed that the oxygen tubing would be a potential weapon to use for an suicide attempt. No psychiatric inpatient beds were available after several days of trying. Following the initial psychiatric assessment, the patient improved clinically and psychiatrically. Given this improvement, tele- psychiatry was reconsulted. The patient was assessed today and outpatient and not inpatient psychiatric treatment was recommended. Therefore, the patient was discharged to home in improved and stable condition.  Of note, the registered nurse at the  [facility] [place] facility was notified of the patient's hospitalization and diagnoses. She will call him in 3 days to discuss his  discharge instructions and to make him appointment to see psychiatrist Dr. Margot Ables early next week. The patient was informed and accepting of the disposition.    Discharge Exam: Blood pressure 168/77, pulse 106, temperature 97.9 F (36.6 C), temperature source Oral, resp. rate 19, height 5\' 4"  (1.626 m), weight 63.141 kg (139 lb 3.2 oz), SpO2 99.00%.  Lungs: Occasional wheezes in the bases. Heart: S1, S2, with no murmurs rubs or gallops. Abdomen: Positive bowel sounds, soft, nontender, nondistended. Extremities: No pedal edema. Psych/neuro: Pleasant affect. Speech is clear. Denies suicidal ideation. Feels better overall. Alert and oriented x3. Cranial nerves II through XII are intact.   Discharge Orders    Future Orders Please Complete By Expires   Diet Carb Modified      Diet - low sodium heart healthy      Increase activity slowly      Discharge instructions      Comments:   [facility] 8 AM [facility] TO DISCUSS YOUR DISCHARGE INSTRUCTIONS AND TO MAKE A FOLLOW UP APPOINTMENT FOR YOU TO FOLLOW UP WITH A PSYCHIATRIST THERE. YOUR DEPRESSION MEDICATIONS HAS CHANGED, SO MAKE NOTE OF THE CHANGES.   Call MD for:      Scheduling Instructions:   CALL YOUR DOCTOR IF YOU BEGIN TO FEEL DEPRESSED.       Discharge time: 45 minutes.    Signed: Lonzell Dorris 07/04/2011, 4:47 PM

## 2012-05-18 ENCOUNTER — Encounter: Payer: Medicare Other | Admitting: Cardiology

## 2012-05-18 NOTE — Progress Notes (Signed)
HPI: 71 year old male for evaluation of abnormal electrocardiogram. Echocardiogram in may of 2009 showed normal LV function.  Current Outpatient Prescriptions  Medication Sig Dispense Refill  . albuterol (PROVENTIL HFA) 108 (90 BASE) MCG/ACT inhaler Inhale 2 puffs into the lungs 4 (four) times daily as needed. For COPD  1 Inhaler  2  . ALPRAZolam (XANAX) 0.25 MG tablet Take 0.25 mg by mouth 3 (three) times daily as needed.       . budesonide-formoterol (SYMBICORT) 160-4.5 MCG/ACT inhaler Inhale 2 puffs into the lungs 2 (two) times daily.  1 Inhaler  2  . lisinopril (PRINIVIL,ZESTRIL) 40 MG tablet Take 40 mg by mouth daily.        . meloxicam (MOBIC) 15 MG tablet Take 15 mg by mouth daily.        . metFORMIN (GLUCOPHAGE) 500 MG tablet Take 1,000 mg by mouth 2 (two) times daily with a meal.       . oxycodone (OXY-IR) 5 MG capsule Take 5 mg by mouth every 8 (eight) hours.        . pravastatin (PRAVACHOL) 80 MG tablet Take 80 mg by mouth daily.        . predniSONE (DELTASONE) 10 MG tablet TAKE 5 TABLETS STARTING TOMORROW FOR 1 DAY; THEN 4 TABLETS THE NEXT DAY; THEN 3 TABLETS THE NEXT DAY; THEN 2 TABLETS THE NEXT DAY; THEN 1 TABLET THE NEXT DAY; THEN STOP.  15 tablet  0  . roflumilast (DALIRESP) 500 MCG TABS tablet Take 500 mcg by mouth daily.        . sildenafil (VIAGRA) 50 MG tablet Take 50 mg by mouth daily as needed. For intercourse       . sodium chloride (ALTAMIST SPRAY) 0.65 % nasal spray Place 1 spray into the nose as needed. For nasal congestion       . tiotropium (SPIRIVA) 18 MCG inhalation capsule Place 18 mcg into inhaler and inhale daily.          No Known Allergies  Past Medical History  Diagnosis Date  . HTN (hypertension)   . Alcohol abuse   . PTSD (post-traumatic stress disorder)   . Chronic back pain   . Depression   . Peripheral neuropathy   . DJD (degenerative joint disease)   . COPD (chronic obstructive pulmonary disease)   . Anemia   . Myocardial infarction   .  Type II or unspecified type diabetes mellitus without mention of complication, not stated as uncontrolled   . Esophageal dysmotility 07/04/2011    Past Surgical History  Procedure Date  . Hernia repair     X 3  . Spinal surgeries     X 4  . Appendectomy   . Prostate surgery     History   Social History  . Marital Status: Single    Spouse Name: N/A    Number of Children: N/A  . Years of Education: N/A   Occupational History  . Not on file.   Social History Main Topics  . Smoking status: Current Every Day Smoker -- 0.5 packs/day  . Smokeless tobacco: Not on file  . Alcohol Use: Yes     Occ  . Drug Use: No  . Sexually Active:    Other Topics Concern  . Not on file   Social History Narrative  . No narrative on file    Family History  Problem Relation Age of Onset  . Lung disease Mother   . Prostate cancer Father  ROS:  no fevers or chills, productive cough, hemoptysis, dysphasia, odynophagia, melena, hematochezia, dysuria, hematuria, rash, seizure activity, orthopnea, PND, pedal edema, claudication. Remaining systems are negative.  Physical Exam:   There were no vitals taken for this visit.  General:  Well developed/well nourished in NAD Skin warm/dry Patient not depressed No peripheral clubbing Back-normal HEENT-normal/normal eyelids Neck supple/normal carotid upstroke bilaterally; no bruits; no JVD; no thyromegaly chest - CTA/ normal expansion CV - RRR/normal S1 and S2; no murmurs, rubs or gallops;  PMI nondisplaced Abdomen -NT/ND, no HSM, no mass, + bowel sounds, no bruit 2+ femoral pulses, no bruits Ext-no edema, chords, 2+ DP Neuro-grossly nonfocal  ECG    This encounter was created in error - please disregard.

## 2012-06-15 ENCOUNTER — Emergency Department (HOSPITAL_COMMUNITY): Payer: Medicare Other

## 2012-06-15 ENCOUNTER — Encounter (HOSPITAL_COMMUNITY): Payer: Self-pay | Admitting: Emergency Medicine

## 2012-06-15 ENCOUNTER — Emergency Department (HOSPITAL_COMMUNITY)
Admission: EM | Admit: 2012-06-15 | Discharge: 2012-06-18 | Disposition: A | Discharge status: Other Acute Inpatient Hospital | Payer: Medicare Other | Attending: Emergency Medicine | Admitting: Emergency Medicine

## 2012-06-15 DIAGNOSIS — F17200 Nicotine dependence, unspecified, uncomplicated: Secondary | ICD-10-CM | POA: Insufficient documentation

## 2012-06-15 DIAGNOSIS — Z79899 Other long term (current) drug therapy: Secondary | ICD-10-CM | POA: Insufficient documentation

## 2012-06-15 DIAGNOSIS — F1010 Alcohol abuse, uncomplicated: Secondary | ICD-10-CM | POA: Insufficient documentation

## 2012-06-15 DIAGNOSIS — J449 Chronic obstructive pulmonary disease, unspecified: Secondary | ICD-10-CM | POA: Insufficient documentation

## 2012-06-15 DIAGNOSIS — M5412 Radiculopathy, cervical region: Secondary | ICD-10-CM | POA: Insufficient documentation

## 2012-06-15 DIAGNOSIS — G8929 Other chronic pain: Secondary | ICD-10-CM | POA: Insufficient documentation

## 2012-06-15 DIAGNOSIS — M79609 Pain in unspecified limb: Secondary | ICD-10-CM | POA: Insufficient documentation

## 2012-06-15 DIAGNOSIS — M542 Cervicalgia: Secondary | ICD-10-CM | POA: Insufficient documentation

## 2012-06-15 DIAGNOSIS — Z862 Personal history of diseases of the blood and blood-forming organs and certain disorders involving the immune mechanism: Secondary | ICD-10-CM | POA: Insufficient documentation

## 2012-06-15 DIAGNOSIS — E119 Type 2 diabetes mellitus without complications: Secondary | ICD-10-CM | POA: Insufficient documentation

## 2012-06-15 DIAGNOSIS — F329 Major depressive disorder, single episode, unspecified: Secondary | ICD-10-CM | POA: Insufficient documentation

## 2012-06-15 DIAGNOSIS — I252 Old myocardial infarction: Secondary | ICD-10-CM | POA: Insufficient documentation

## 2012-06-15 DIAGNOSIS — R45851 Suicidal ideations: Secondary | ICD-10-CM

## 2012-06-15 DIAGNOSIS — J4489 Other specified chronic obstructive pulmonary disease: Secondary | ICD-10-CM | POA: Insufficient documentation

## 2012-06-15 DIAGNOSIS — F4310 Post-traumatic stress disorder, unspecified: Secondary | ICD-10-CM | POA: Insufficient documentation

## 2012-06-15 DIAGNOSIS — I10 Essential (primary) hypertension: Secondary | ICD-10-CM | POA: Insufficient documentation

## 2012-06-15 DIAGNOSIS — F3289 Other specified depressive episodes: Secondary | ICD-10-CM | POA: Insufficient documentation

## 2012-06-15 LAB — CBC WITH DIFFERENTIAL/PLATELET
Basophils Absolute: 0 10*3/uL (ref 0.0–0.1)
Basophils Relative: 0 % (ref 0–1)
Eosinophils Absolute: 0.1 10*3/uL (ref 0.0–0.7)
Eosinophils Relative: 1 % (ref 0–5)
HCT: 34.6 % — ABNORMAL LOW (ref 39.0–52.0)
Hemoglobin: 11.9 g/dL — ABNORMAL LOW (ref 13.0–17.0)
MCH: 30.1 pg (ref 26.0–34.0)
MCHC: 34.4 g/dL (ref 30.0–36.0)
MCV: 87.4 fL (ref 78.0–100.0)
Monocytes Absolute: 1.1 10*3/uL — ABNORMAL HIGH (ref 0.1–1.0)
Monocytes Relative: 8 % (ref 3–12)
RDW: 13.8 % (ref 11.5–15.5)

## 2012-06-15 LAB — GLUCOSE, CAPILLARY

## 2012-06-15 LAB — TROPONIN I
Troponin I: <0.3 ng/mL (ref <0.30)
Troponin I: <0.3 ng/mL (ref <0.30)

## 2012-06-15 LAB — COMPREHENSIVE METABOLIC PANEL
AST: 25 U/L (ref 0–37)
Albumin: 3.9 g/dL (ref 3.5–5.2)
BUN: 8 mg/dL (ref 6–23)
Calcium: 9.9 mg/dL (ref 8.4–10.5)
Chloride: 97 mEq/L (ref 96–112)
Creatinine, Ser: 0.85 mg/dL (ref 0.50–1.35)
Total Bilirubin: 0.2 mg/dL — ABNORMAL LOW (ref 0.3–1.2)
Total Protein: 8.1 g/dL (ref 6.0–8.3)

## 2012-06-15 LAB — URINALYSIS, ROUTINE W REFLEX MICROSCOPIC
Bilirubin Urine: NEGATIVE
Glucose, UA: NEGATIVE mg/dL
Protein, ur: 100 mg/dL — AB

## 2012-06-15 LAB — D-DIMER, QUANTITATIVE: D-Dimer, Quant: 0.86 ug/mL-FEU — ABNORMAL HIGH (ref 0.00–0.48)

## 2012-06-15 LAB — RAPID URINE DRUG SCREEN, HOSP PERFORMED
Amphetamines: NOT DETECTED
Cocaine: NOT DETECTED
Opiates: NOT DETECTED

## 2012-06-15 LAB — URINE MICROSCOPIC-ADD ON

## 2012-06-15 MED ORDER — METFORMIN HCL 500 MG PO TABS
1000.0000 mg | ORAL_TABLET | Freq: Two times a day (BID) | ORAL | Status: DC
  Administered 2012-06-15: 1000 mg via ORAL
  Filled 2012-06-15 (×2): qty 2

## 2012-06-15 MED ORDER — THIAMINE HCL 100 MG/ML IJ SOLN
100.0000 mg | Freq: Every day | INTRAMUSCULAR | Status: DC
  Filled 2012-06-15: qty 2

## 2012-06-15 MED ORDER — VITAMIN B-1 100 MG PO TABS
100.0000 mg | ORAL_TABLET | Freq: Every day | ORAL | Status: DC
  Administered 2012-06-15: 100 mg via ORAL
  Filled 2012-06-15 (×2): qty 1

## 2012-06-15 MED ORDER — IPRATROPIUM BROMIDE 0.02 % IN SOLN
0.5000 mg | Freq: Once | RESPIRATORY_TRACT | Status: AC
  Administered 2012-06-15: 0.5 mg via RESPIRATORY_TRACT
  Filled 2012-06-15: qty 2.5

## 2012-06-15 MED ORDER — IPRATROPIUM BROMIDE 0.02 % IN SOLN
500.0000 ug | Freq: Two times a day (BID) | RESPIRATORY_TRACT | Status: DC | PRN
  Administered 2012-06-18: 500 ug via RESPIRATORY_TRACT
  Filled 2012-06-15: qty 2.5

## 2012-06-15 MED ORDER — CITALOPRAM HYDROBROMIDE 20 MG PO TABS
20.0000 mg | ORAL_TABLET | Freq: Every day | ORAL | Status: DC
  Administered 2012-06-15: 20 mg via ORAL
  Filled 2012-06-15 (×4): qty 1

## 2012-06-15 MED ORDER — TIOTROPIUM BROMIDE MONOHYDRATE 18 MCG IN CAPS
18.0000 ug | ORAL_CAPSULE | Freq: Every day | RESPIRATORY_TRACT | Status: DC
  Filled 2012-06-15: qty 5

## 2012-06-15 MED ORDER — LORAZEPAM 1 MG PO TABS
0.0000 mg | ORAL_TABLET | Freq: Two times a day (BID) | ORAL | Status: DC
  Administered 2012-06-18: 1 mg via ORAL

## 2012-06-15 MED ORDER — ONDANSETRON HCL 4 MG PO TABS: 4.0000 mg | ORAL_TABLET | Freq: Three times a day (TID) | ORAL | Status: DC | PRN

## 2012-06-15 MED ORDER — INSULIN ASPART 100 UNIT/ML ~~LOC~~ SOLN
0.0000 [IU] | Freq: Three times a day (TID) | SUBCUTANEOUS | Status: DC
  Administered 2012-06-17: 1 [IU] via SUBCUTANEOUS
  Filled 2012-06-15: qty 1

## 2012-06-15 MED ORDER — ALBUTEROL SULFATE (5 MG/ML) 0.5% IN NEBU
2.5000 mg | INHALATION_SOLUTION | RESPIRATORY_TRACT | Status: DC | PRN
  Administered 2012-06-18: 2.5 mg via RESPIRATORY_TRACT
  Filled 2012-06-15: qty 0.5

## 2012-06-15 MED ORDER — ADULT MULTIVITAMIN W/MINERALS CH
1.0000 | ORAL_TABLET | Freq: Every day | ORAL | Status: DC
  Administered 2012-06-15: 1 via ORAL
  Filled 2012-06-15 (×2): qty 1

## 2012-06-15 MED ORDER — BUDESONIDE-FORMOTEROL FUMARATE 160-4.5 MCG/ACT IN AERO
INHALATION_SPRAY | RESPIRATORY_TRACT | Status: AC
Start: 2012-06-15 — End: 2012-06-15
  Filled 2012-06-15: qty 6

## 2012-06-15 MED ORDER — CITALOPRAM HYDROBROMIDE 20 MG PO TABS
ORAL_TABLET | ORAL | Status: AC
  Filled 2012-06-15: qty 1

## 2012-06-15 MED ORDER — FOLIC ACID 1 MG PO TABS
1.0000 mg | ORAL_TABLET | Freq: Every day | ORAL | Status: DC
  Administered 2012-06-15: 1 mg via ORAL
  Filled 2012-06-15 (×2): qty 1

## 2012-06-15 MED ORDER — ALBUTEROL SULFATE (5 MG/ML) 0.5% IN NEBU
2.5000 mg | INHALATION_SOLUTION | Freq: Once | RESPIRATORY_TRACT | Status: AC
  Administered 2012-06-15: 2.5 mg via RESPIRATORY_TRACT
  Filled 2012-06-15: qty 0.5

## 2012-06-15 MED ORDER — LORAZEPAM 1 MG PO TABS
1.0000 mg | ORAL_TABLET | Freq: Four times a day (QID) | ORAL | Status: DC | PRN
  Administered 2012-06-16 – 2012-06-18 (×4): 1 mg via ORAL
  Filled 2012-06-15 (×6): qty 1

## 2012-06-15 MED ORDER — IBUPROFEN 400 MG PO TABS
600.0000 mg | ORAL_TABLET | Freq: Three times a day (TID) | ORAL | Status: DC | PRN
  Administered 2012-06-16 – 2012-06-18 (×3): 600 mg via ORAL
  Filled 2012-06-15 (×2): qty 2
  Filled 2012-06-15: qty 1

## 2012-06-15 MED ORDER — LORAZEPAM 2 MG/ML IJ SOLN: 1.0000 mg | Freq: Four times a day (QID) | INTRAMUSCULAR | Status: DC | PRN

## 2012-06-15 MED ORDER — OXYCODONE-ACETAMINOPHEN 5-325 MG PO TABS
2.0000 | ORAL_TABLET | Freq: Once | ORAL | Status: AC
  Administered 2012-06-15: 2 via ORAL
  Filled 2012-06-15: qty 2

## 2012-06-15 MED ORDER — IOHEXOL 350 MG/ML SOLN
100.0000 mL | Freq: Once | INTRAVENOUS | Status: AC | PRN
  Administered 2012-06-15: 100 mL via INTRAVENOUS

## 2012-06-15 MED ORDER — TEMAZEPAM 15 MG PO CAPS
15.0000 mg | ORAL_CAPSULE | Freq: Every evening | ORAL | Status: DC | PRN
  Administered 2012-06-17: 15 mg via ORAL
  Filled 2012-06-15: qty 1

## 2012-06-15 MED ORDER — BUDESONIDE-FORMOTEROL FUMARATE 160-4.5 MCG/ACT IN AERO
2.0000 | INHALATION_SPRAY | Freq: Two times a day (BID) | RESPIRATORY_TRACT | Status: DC
  Administered 2012-06-15 – 2012-06-18 (×6): 2 via RESPIRATORY_TRACT
  Filled 2012-06-15: qty 6

## 2012-06-15 MED ORDER — LORAZEPAM 1 MG PO TABS
0.0000 mg | ORAL_TABLET | Freq: Four times a day (QID) | ORAL | Status: AC
  Administered 2012-06-15 – 2012-06-17 (×2): 1 mg via ORAL
  Filled 2012-06-15: qty 1

## 2012-06-15 NOTE — ED Notes (Signed)
Per EDP pt threatning to blow brains if sent home. EDP ordering tele psych consult.

## 2012-06-15 NOTE — ED Provider Notes (Addendum)
History  This chart was scribed for Brandon Octave, MD by Erskine Emery, ED Scribe. This patient was seen in room APA19/[facility]19 and the patient's care was started at 16:28.   CSN: [id number]  Arrival date & time 06/15/12  1625   First MD Initiated Contact with Patient 06/15/12 1628      Chief Complaint  Patient presents with  . Shoulder Pain    (Consider location/radiation/quality/duration/timing/severity/associated sxs/prior treatment) The history is provided by the patient. No language interpreter was used.  [name] is a 71 y.o. male who presents to the Emergency Department complaining of intermittent but gradually worsening left upper back pain for the past 2 months, that radiates from the neck through the left arm and to the left chest. Pt reports a h/o 4 back surgeries and neck surgery a couple years ago. Pt also reports left leg pain, a cough, and baseline incontinence but he denies any fevers or falls. Pt usually takes oxycodone for his pain but he is out of his pain medication. Pt has a h/o heart attacks, COPD, HTN, DM, peripheral neuropathy, degenerative joint disease, and chronic back pain. Pt is on oxygen 24/7.  Dr. Modesto Charon in [place] is the pt's PCP.  Past Medical History  Diagnosis Date  . HTN (hypertension)   . Alcohol abuse   . PTSD (post-traumatic stress disorder)   . Chronic back pain   . Depression   . Peripheral neuropathy   . DJD (degenerative joint disease)   . COPD (chronic obstructive pulmonary disease)   . Anemia   . Myocardial infarction   . Type II or unspecified type diabetes mellitus without mention of complication, not stated as uncontrolled   . Esophageal dysmotility 07/04/2011    Past Surgical History  Procedure Date  . Hernia repair     X 3  . Spinal surgeries     X 4  . Appendectomy   . Prostate surgery     Family History  Problem Relation Age of Onset  . Lung disease Mother   . Prostate cancer Father     History  Substance  Use Topics  . Smoking status: Current Every Day Smoker -- 0.5 packs/day  . Smokeless tobacco: Not on file  . Alcohol Use: Yes     Comment: Occ      Review of Systems A complete 10 system review of systems was obtained and all systems are negative except as noted in the HPI and PMH.    Allergies  Review of patient's allergies indicates no known allergies.  Home Medications   Current Outpatient Rx  Name  Route  Sig  Dispense  Refill  . ALBUTEROL SULFATE HFA 108 (90 BASE) MCG/ACT IN AERS   Inhalation   Inhale 2 puffs into the lungs 4 (four) times daily as needed. For COPD   1 Inhaler   2   . ALPRAZOLAM 0.25 MG PO TABS   Oral   Take 0.25 mg by mouth 3 (three) times daily as needed.          . BUDESONIDE-FORMOTEROL FUMARATE 160-4.5 MCG/ACT IN AERO   Inhalation   Inhale 2 puffs into the lungs 2 (two) times daily.   1 Inhaler   2   . LISINOPRIL 40 MG PO TABS   Oral   Take 40 mg by mouth daily.           . MELOXICAM 15 MG PO TABS   Oral   Take 15 mg by mouth daily.           [name]  METFORMIN HCL 500 MG PO TABS   Oral   Take 1,000 mg by mouth 2 (two) times daily with a meal.          . OXYCODONE HCL 5 MG PO CAPS   Oral   Take 5 mg by mouth every 8 (eight) hours.           [name] PRAVASTATIN SODIUM 80 MG PO TABS   Oral   Take 80 mg by mouth daily.           [name] PREDNISONE 10 MG PO TABS      TAKE 5 TABLETS STARTING TOMORROW FOR 1 DAY; THEN 4 TABLETS THE NEXT DAY; THEN 3 TABLETS THE NEXT DAY; THEN 2 TABLETS THE NEXT DAY; THEN 1 TABLET THE NEXT DAY; THEN STOP.   15 tablet   0   . ROFLUMILAST 500 MCG PO TABS   Oral   Take 500 mcg by mouth daily.           [name] SILDENAFIL CITRATE 50 MG PO TABS   Oral   Take 50 mg by mouth daily as needed. For intercourse          . SALINE NASAL SPRAY 0.65 % NA SOLN   Nasal   Place 1 spray into the nose as needed. For nasal congestion          . TIOTROPIUM BROMIDE MONOHYDRATE 18 MCG IN CAPS   Inhalation   Place 18 mcg  into inhaler and inhale daily.             Triage Vitals: BP 182/97  Pulse 115  Temp 98.6 F (37 C) (Oral)  Resp 18  Ht 5\' 3"  (1.6 m)  Wt 140 lb (63.504 kg)  BMI 24.80 kg/m2  SpO2 95%  Physical Exam  Nursing note and vitals reviewed. Constitutional: He is oriented to person, place, and time. He appears well-developed and well-nourished.  HENT:  Head: Normocephalic and atraumatic.  Eyes: EOM are normal. Pupils are equal, round, and reactive to light.  Neck: Neck supple. No tracheal deviation present.  Cardiovascular: Normal rate.        +2 DP and PD pulses.  Pulmonary/Chest: Effort normal. No respiratory distress. He exhibits tenderness.       Coarse breath sounds bilaterally. Left chest is tender to palpation.  Abdominal: Soft. He exhibits no distension.  Musculoskeletal: Normal range of motion. He exhibits no edema.       Strength is intact. Equal grip bilaterally. Full ROM of left shoulder. Tender to palpation of left trapezius and paraspinal muscles. +2 patellar reflexes. Unable to extend L great toe which patient states is baseline. 5/5 strength in lower extremities.    Ankle plantar and dorsiflexion intact. Right Great toe extension intact. +2 DP and PT pulses. +2 patellar reflexes bilaterally. Normal gait.   Neurological: He is alert and oriented to person, place, and time. No cranial nerve deficit.       Cranial nerves intact.  Skin: Skin is warm and dry.  Psychiatric: He has a normal mood and affect.    ED Course  Procedures (including critical care time) DIAGNOSTIC STUDIES: Oxygen Saturation is 95% on [place], adequate by my interpretation.    COORDINATION OF CARE: 16:40--I evaluated the patient and we discussed a treatment plan including x-rays and pain medication to which the pt agreed.   19:40--I rechecked the pt and notified him of the results of his x-rays. I explained to him that he has arthritis. Pt  reports he is not able to walk, but that is baseline. Pt  requests something for pain and states he has been thinking of hurting himself because the pain is so bad. Pt would like to speak to a psychiatrist before leaving.  20:18--I rechecked the pt and notified him that we are still waiting to hear back from telepsych.   Results for orders placed during the hospital encounter of 06/15/12  CBC WITH DIFFERENTIAL      Component Value Range   WBC 13.0 (*) 4.0 - 10.5 K/uL   RBC 3.96 (*) 4.22 - 5.81 MIL/uL   Hemoglobin 11.9 (*) 13.0 - 17.0 g/dL   HCT 14.7 (*) 82.9 - 56.2 %   MCV 87.4  78.0 - 100.0 fL   MCH 30.1  26.0 - 34.0 pg   MCHC 34.4  30.0 - 36.0 g/dL   RDW 13.0  86.5 - 78.4 %   Platelets 359  150 - 400 K/uL   Neutrophils Relative 73  43 - 77 %   Neutro Abs 9.5 (*) 1.7 - 7.7 K/uL   Lymphocytes Relative 18  12 - 46 %   Lymphs Abs 2.4  0.7 - 4.0 K/uL   Monocytes Relative 8  3 - 12 %   Monocytes Absolute 1.1 (*) 0.1 - 1.0 K/uL   Eosinophils Relative 1  0 - 5 %   Eosinophils Absolute 0.1  0.0 - 0.7 K/uL   Basophils Relative 0  0 - 1 %   Basophils Absolute 0.0  0.0 - 0.1 K/uL  COMPREHENSIVE METABOLIC PANEL      Component Value Range   Sodium 139  135 - 145 mEq/L   Potassium 3.9  3.5 - 5.1 mEq/L   Chloride 97  96 - 112 mEq/L   CO2 26  19 - 32 mEq/L   Glucose, Bld 112 (*) 70 - 99 mg/dL   BUN 8  6 - 23 mg/dL   Creatinine, Ser 6.96  0.50 - 1.35 mg/dL   Calcium 9.9  8.4 - 29.5 mg/dL   Total Protein 8.1  6.0 - 8.3 g/dL   Albumin 3.9  3.5 - 5.2 g/dL   AST 25  0 - 37 U/L   ALT 17  0 - 53 U/L   Alkaline Phosphatase 109  39 - 117 U/L   Total Bilirubin 0.2 (*) 0.3 - 1.2 mg/dL   GFR calc non Af Amer 86 (*) >90 mL/min   GFR calc Af Amer >90  >90 mL/min  TROPONIN I      Component Value Range   Troponin I <0.30  <0.30 ng/mL  URINE RAPID DRUG SCREEN (HOSP PERFORMED)      Component Value Range   Opiates NONE DETECTED  NONE DETECTED   Cocaine NONE DETECTED  NONE DETECTED   Benzodiazepines NONE DETECTED  NONE DETECTED   Amphetamines NONE  DETECTED  NONE DETECTED   Tetrahydrocannabinol NONE DETECTED  NONE DETECTED   Barbiturates NONE DETECTED  NONE DETECTED  URINALYSIS, ROUTINE W REFLEX MICROSCOPIC      Component Value Range   Color, Urine YELLOW  YELLOW   APPearance CLEAR  CLEAR   Specific Gravity, Urine 1.010  1.005 - 1.030   pH 5.5  5.0 - 8.0   Glucose, UA NEGATIVE  NEGATIVE mg/dL   Hgb urine dipstick LARGE (*) NEGATIVE   Bilirubin Urine NEGATIVE  NEGATIVE   Ketones, ur NEGATIVE  NEGATIVE mg/dL   Protein, ur 284 (*) NEGATIVE mg/dL   Urobilinogen, UA  0.2  0.0 - 1.0 mg/dL   Nitrite NEGATIVE  NEGATIVE   Leukocytes, UA NEGATIVE  NEGATIVE  D-DIMER, QUANTITATIVE      Component Value Range   D-Dimer, Quant 0.86 (*) 0.00 - 0.48 ug/mL-FEU  URINE MICROSCOPIC-ADD ON      Component Value Range   Squamous Epithelial / LPF RARE  RARE   WBC, UA 0-2  <3 WBC/hpf   RBC / HPF 11-20  <3 RBC/hpf   Bacteria, UA RARE  RARE   Dg Chest 2 View  06/15/2012  *RADIOLOGY REPORT*  Clinical Data: Right-sided shoulder pain and chest pain.  CHEST - 2 VIEW  Comparison: 06/30/2011 and CT of the chest dated 07/01/2011.  Findings: Stable evidence of COPD.  No edema, infiltrate, nodule, pleural fluid or pneumothorax is seen.  The heart size is normal. The visualized bony structures show stable sclerosis and mild loss of height of the T8 vertebral body.  IMPRESSION: Stable COPD.  No active disease.   Original Report Authenticated By: Irish Lack, M.D.    Mr Cervical Spine Wo Contrast  06/15/2012  *RADIOLOGY REPORT*  Clinical Data: Left shoulder pain, arm pain and weakness.  MRI CERVICAL SPINE WITHOUT CONTRAST  Technique:  Multiplanar and multiecho pulse sequences of the cervical spine, to include the craniocervical junction and cervicothoracic junction, were obtained according to standard protocol without intravenous contrast.  Comparison:  Post mg CT 04/23/2004.  Findings: Motion degraded exam.  Cervical medullary junction and visualized intracranial  structures unremarkable.  No focal cervical cord signal abnormality.  Mild edema surrounds the right C2-3 facet joint felt to be related to degenerative changes without worrisome soft tissue abnormality noted.  Both vertebral arteries are patent.  C2-3:  Right-sided facet joint degenerative changes.  Minimal anterior slip of C2.  Mild bulge.  Mild spinal stenosis.  Mild foraminal narrowing greater on the right.  C3-4:  Broad-based disc osteophyte.  Uncinate bony overgrowth. Mild facet joint degenerative changes.  Mild spinal stenosis. Minimal cord deformity.  Moderate bilateral foraminal narrowing greater on the left.  C4-5:  Broad-based disc osteophyte.  Facet joint degenerative changes.  Uncinate hypertrophy.  Mild spinal stenosis.  Minimal cord contact.  Moderate right-sided and mild left-sided foraminal narrowing.  C5-6:  Bulge.  Minimal uncinate bony overgrowth.  Mild spinal stenosis.  Mild left-sided facet joint degenerative changes.  Mild left foraminal narrowing.  C6-7:  Broad-based disc osteophyte complex slightly greater to the left.  Spinal stenosis greater left.  Slight cord flattening greater left.  Uncinate bony overgrowth greater on the left.  Mild right-sided with moderate left-sided foraminal narrowing.  C7-T1:  Facet joint degenerative changes with 3.5 mm anterior slip of C7.  Diffuse pseudodisc bulge.  Extension of the posterior superior aspect of the T1 vertebral body into the neural foramen bilaterally with prominent bilateral foraminal narrowing.  Spinal stenosis with mild cord flattening with the cord draped over the T1 vertebral body.  T1-2:  Mild bilateral foraminal narrowing.  IMPRESSION: Motion degraded exam.  Multilevel cervical spondylotic changes most notable at the C7-T1 level as detailed above.   Original Report Authenticated By: Lacy Duverney, M.D.    Mr Lumbar Spine Wo Contrast  06/15/2012  *RADIOLOGY REPORT*  Clinical Data: Left leg weakness.  Low back pain.  MRI LUMBAR SPINE  WITHOUT CONTRAST  Technique:  Multiplanar and multiecho pulse sequences of the lumbar spine were obtained without intravenous contrast.  Comparison: Post myelogram CT 04/23/2004.  Findings: Last fully open disc space is labeled L5-S1.  Present examination incorporates from T10-11 disc space through the S3 level.  Conus just below the T12-L1 disc space.  No worrisome osseous lesion.  Visualized paravertebral structures unremarkable.  T10-11:  Schmorl's node deformity superior plate [id number].  Minimal bulge.  T11-12:  Small Schmorl's node deformity inferior plate W96.  Mild bulge.  Mild spinal stenosis with the cord draped over this region but not significantly compressed.  T12-L1:  Minimal left facet joint bony overgrowth.  L1-2:  Facet joint degenerative changes and ligamentum flavum hypertrophy slightly greater on the left.  Minimal impression upon the left posterior lateral aspect of thecal sac.  Mild disc degeneration and bulge.  L2-3:  Mild facet joint bony overgrowth.  Minimal bulge.  L3-4:  Mild left-sided facet joint degenerative changes and ligamentum flavum hypertrophy.  Very mild bulge.  L4-5:  Prior right hemilaminectomy.  Facet joint degenerative changes and ligamentum flavum hypertrophy much more notable on the left.  Bulge.  Moderate left-sided and mild right-sided lateral recess stenosis and spinal stenosis.  L5-S1:  Prior right partial facetectomies and hemilaminectomy. Right foraminal/lateral disc osteophyte with mild encroaching upon the exiting right L5 nerve root.  Left lateral disc osteophyte without compression of the exiting left L5 nerve root.  Scar tissue surrounds the right lateral aspect of the thecal sac and upper aspect of the right S1 nerve root.  IMPRESSION: Postoperative changes at the L4-5 and L5-S1 level are without significant change when compared to the prior post myelogram CT. This includes left greater than right L4-5 lateral recess stenosis and spinal stenosis.  T11-12 mild bulge  causes spinal stenosis with the cord draped over this region but not significantly compressed.  Less notable degenerative changes at remaining levels as detailed above.   Original Report Authenticated By: Lacy Duverney, M.D.    Dg Shoulder Left  06/15/2012  *RADIOLOGY REPORT*  Clinical Data: Left shoulder pain.  LEFT SHOULDER - 2+ VIEW  Comparison: None.  Findings: Advanced degenerative changes are seen involving the [facility] joint.  There also are degenerative changes involving the glenohumeral joint and a spur along the lateral margin of the humeral head.  No evidence of acute fracture or dislocation.  No bony lesions or destruction identified.  Soft tissues are unremarkable.  IMPRESSION: Degenerative changes involving the shoulder as above with the most significant degenerative changes seen involving the [facility] joint.   Original Report Authenticated By: Irish Lack, M.D.       No diagnosis found.    MDM  Patient Is a difficult historian presents with several complaints of intermittent over the past several months. He complains of left-sided neck and upper back pain it radiates into his left arm and chest. He denies any trauma. He complains of tingling and numbness in his L arm and feels that "something is loose" in his neck.    Concern for cervical radiculopathy given distribution of pain and subjective weakness in left arm. Equal grip strength on exam. We'll proceed with cervical spine MRI. Patient was weak great toe extension the left lower extremity which he says is baseline from previous surgery. Lower extremity strength symmetrical. Good reflexes.  EKG nonischemic. Lung clear, mild tachycardia.  CTPE negative. MRI negative for cord compression. Multiple degenerative changes.  On attempted discharge, patient stated he might as well it is "blow his head off".  He endorses suicidal thoughts as he did during his last admission.  Will consult telepsych.  Psychiatric consult complete and admission  recommended. Holding orders placed.   Date:  06/15/2012  Rate: 115  Rhythm: sinus tachycardia and premature ventricular contractions (PVC)  QRS Axis: normal  Intervals: PR prolonged  ST/T Wave abnormalities: normal  Conduction Disutrbances:none  Narrative Interpretation: rate faster, septal Q waves unchanged  Old EKG Reviewed: changes noted     I personally performed the services described in this documentation, which was scribed in my presence. The recorded information has been reviewed and is accurate.    Brandon Octave, MD 06/15/12 1308  Brandon Octave, MD 06/16/12 1137

## 2012-06-15 NOTE — ED Notes (Signed)
Pt c/o left shoulder pain for a while. Pt states the pain is unbearable. Pt is out of pain medication.

## 2012-06-15 NOTE — BH Assessment (Signed)
Assessment Note  Patient is a 71 year old white male.  Patient was brought into the ER by the police.  Patient reports that he is not able to contract for safety.  Tele Psych recommends inpatient hospitalization.   Patient reports that he has attempted to commit suicide in the past.  Patient was not able to remember the date in which he was hospitalized for past suicide attempts in which he attempted to, "take a lot of his Paxil in an attempt to kill himself.  Patient reports that he is not compliant with taking his medication. Patient reports that he does not remember the last time that he has taken his medication.  Documentation in the file reports that the patient informed the EDP that he was going to blow his brains out if he was sent home.  Patient reports that he has been hearing whispering and mumbling voices telling him to hurt himself.  Patient reports that he does not remember how long he had been hearing these voices and seeing things move that no one else can see.  Patient reports that he does not have a past history of substance abuse.   Patients UDS was negative. Patient reports as prior history of outpatient therapy.  Patient reports that he no longer has a therapist.    Axis I: Bipolar, Depressed and Mood Disorder NOS Axis II: Deferred Axis III:  Past Medical History  Diagnosis Date  . HTN (hypertension)   . Alcohol abuse   . PTSD (post-traumatic stress disorder)   . Chronic back pain   . Depression   . Peripheral neuropathy   . DJD (degenerative joint disease)   . COPD (chronic obstructive pulmonary disease)   . Anemia   . Myocardial infarction   . Type II or unspecified type diabetes mellitus without mention of complication, not stated as uncontrolled   . Esophageal dysmotility 07/04/2011   Axis IV: economic problems, other psychosocial or environmental problems, problems related to social environment, problems with access to health care services and problems with primary  support group Axis V: 21-30 behavior considerably influenced by delusions or hallucinations OR serious impairment in judgment, communication OR inability to function in almost all areas\  Past Medical History:  Past Medical History  Diagnosis Date  . HTN (hypertension)   . Alcohol abuse   . PTSD (post-traumatic stress disorder)   . Chronic back pain   . Depression   . Peripheral neuropathy   . DJD (degenerative joint disease)   . COPD (chronic obstructive pulmonary disease)   . Anemia   . Myocardial infarction   . Type II or unspecified type diabetes mellitus without mention of complication, not stated as uncontrolled   . Esophageal dysmotility 07/04/2011    Past Surgical History  Procedure Date  . Hernia repair     X 3  . Spinal surgeries     X 4  . Appendectomy   . Prostate surgery     Family History:  Family History  Problem Relation Age of Onset  . Lung disease Mother   . Prostate cancer Father     Social History:  reports that he has been smoking.  He does not have any smokeless tobacco history on file. He reports that he drinks alcohol. He reports that he does not use illicit drugs.  Additional Social History:     CIWA: CIWA-Ar BP: 188/84 mmHg Pulse Rate: 111  COWS:    Allergies: No Known Allergies  Home Medications:  (  Not in a hospital admission)  OB/GYN Status:  No LMP for male patient.  General Assessment Data Location of Assessment: [facility] ACT Assessment: Yes Living Arrangements: Alone Can pt return to current living arrangement?: Yes Admission Status: Voluntary Is patient capable of signing voluntary admission?: Yes Transfer from: Acute Hospital Referral Source: Self/Family/Friend  Education Status Is patient currently in school?: No  Risk to self Suicidal Ideation: Yes-Currently Present Suicidal Intent: Yes-Currently Present Is patient at risk for suicide?: Yes Suicidal Plan?: Yes-Currently Present Specify Current Suicidal Plan:  overdose of pills, using as gun to kill himself Access to Means: Yes Specify Access to Suicidal Means: medication  What has been your use of drugs/alcohol within the last 12 months?: None reported Previous Attempts/Gestures: Yes How many times?: 2  Other Self Harm Risks: None Reported Triggers for Past Attempts: None known Intentional Self Injurious Behavior: None Family Suicide History: No Recent stressful life event(s): Conflict (Comment);Financial Problems;Trauma (Comment);Other (Comment) Persecutory voices/beliefs?: Yes Depression: Yes Depression Symptoms: Despondent;Tearfulness;Isolating;Fatigue;Guilt;Loss of interest in usual pleasures;Feeling worthless/self pity;Feeling angry/irritable Substance abuse history and/or treatment for substance abuse?: No Suicide prevention information given to non-admitted patients: Yes  Risk to Others Homicidal Ideation: No Thoughts of Harm to Others: No Current Homicidal Intent: No Current Homicidal Plan: No Access to Homicidal Means: No Identified Victim: None  History of harm to others?: No Assessment of Violence: None Noted Violent Behavior Description: None Repoted Does patient have access to weapons?: No Criminal Charges Pending?: No Does patient have a court date: No  Psychosis Hallucinations: Auditory;Visual Delusions: None noted  Mental Status Report Appear/Hygiene: Bizarre;Body odor;Disheveled Eye Contact: Poor Motor Activity: Freedom of movement Speech: Pressured;Soft;Slow Level of Consciousness: Alert Mood: Depressed Affect: Anxious Anxiety Level: Moderate Thought Processes: Coherent;Relevant Judgement: Unimpaired Orientation: Person;Place;Time;Situation Obsessive Compulsive Thoughts/Behaviors: None  Cognitive Functioning Concentration: Normal Memory: Recent Intact;Remote Intact IQ: Average Insight: Poor Impulse Control: Poor Appetite: Fair Weight Loss: 0  Weight Gain: 0  Sleep: Decreased Total Hours of  Sleep: 5  Vegetative Symptoms: Not bathing;Decreased grooming  ADLScreening [facility] Assessment Services) Patient's cognitive ability adequate to safely complete daily activities?: Yes Patient able to express need for assistance with ADLs?: Yes Independently performs ADLs?: Yes (appropriate for developmental age)  Abuse/Neglect [facility]) Physical Abuse: Denies Verbal Abuse: Denies Sexual Abuse: Denies  Prior Inpatient Therapy Prior Inpatient Therapy: Yes Prior Therapy Dates: unable to remember  Prior Therapy Facilty/Provider(s): unable to reember  Reason for Treatment: unable to remember   Prior Outpatient Therapy Prior Outpatient Therapy: Yes Prior Therapy Dates: unable to rememebr  Prior Therapy Facilty/Provider(s): unable to remember  Reason for Treatment: unable to remember   ADL Screening (condition at time of admission) Patient's cognitive ability adequate to safely complete daily activities?: Yes Patient able to express need for assistance with ADLs?: Yes Independently performs ADLs?: Yes (appropriate for developmental age)       Abuse/Neglect Assessment (Assessment to be complete while patient is alone) Physical Abuse: Denies Verbal Abuse: Denies Sexual Abuse: Denies Values / Beliefs Cultural Requests During Hospitalization: None Spiritual Requests During Hospitalization: None        Additional Information 1:1 In Past 12 Months?: No CIRT Risk: No Elopement Risk: No Does patient have medical clearance?: Yes     Disposition: Pending [facility].  Disposition Disposition of Patient: Referred to Patient referred to: Other (Comment)  On Site Evaluation by:   Reviewed with Physician:     Phillip Heal LaVerne 06/15/2012 11:50 PM

## 2012-06-15 NOTE — ED Notes (Signed)
Tele-psych on line with patient at this time.

## 2012-06-16 LAB — GLUCOSE, CAPILLARY: Glucose-Capillary: 102 mg/dL — ABNORMAL HIGH (ref 70–99)

## 2012-06-16 MED ORDER — THIAMINE HCL 100 MG/ML IJ SOLN: 100.0000 mg | Freq: Every day | INTRAMUSCULAR | Status: DC

## 2012-06-16 MED ORDER — FOLIC ACID 1 MG PO TABS
1.0000 mg | ORAL_TABLET | Freq: Every day | ORAL | Status: DC
  Administered 2012-06-16 – 2012-06-18 (×3): 1 mg via ORAL
  Filled 2012-06-16 (×2): qty 1

## 2012-06-16 MED ORDER — VITAMIN B-1 100 MG PO TABS
100.0000 mg | ORAL_TABLET | Freq: Every day | ORAL | Status: DC
  Administered 2012-06-16 – 2012-06-18 (×3): 100 mg via ORAL
  Filled 2012-06-16 (×2): qty 1

## 2012-06-16 MED ORDER — ADULT MULTIVITAMIN W/MINERALS CH
1.0000 | ORAL_TABLET | Freq: Every day | ORAL | Status: DC
  Administered 2012-06-16 – 2012-06-18 (×3): 1 via ORAL
  Filled 2012-06-16 (×2): qty 1

## 2012-06-16 MED ORDER — OXYCODONE-ACETAMINOPHEN 5-325 MG PO TABS
2.0000 | ORAL_TABLET | Freq: Once | ORAL | Status: AC
  Administered 2012-06-16: 2 via ORAL
  Filled 2012-06-16: qty 2
  Filled 2012-06-16: qty 1

## 2012-06-16 MED ORDER — METFORMIN HCL 500 MG PO TABS
1000.0000 mg | ORAL_TABLET | Freq: Two times a day (BID) | ORAL | Status: DC
  Administered 2012-06-16 – 2012-06-18 (×4): 1000 mg via ORAL
  Filled 2012-06-16 (×3): qty 2

## 2012-06-16 MED ORDER — CITALOPRAM HYDROBROMIDE 20 MG PO TABS
20.0000 mg | ORAL_TABLET | Freq: Every day | ORAL | Status: DC
  Administered 2012-06-16 – 2012-06-18 (×3): 20 mg via ORAL
  Filled 2012-06-16 (×6): qty 1

## 2012-06-16 MED ORDER — TIOTROPIUM BROMIDE MONOHYDRATE 18 MCG IN CAPS
18.0000 ug | ORAL_CAPSULE | Freq: Every day | RESPIRATORY_TRACT | Status: DC
  Administered 2012-06-16 – 2012-06-18 (×3): 18 ug via RESPIRATORY_TRACT
  Filled 2012-06-16: qty 5

## 2012-06-16 NOTE — ED Notes (Signed)
Asked patient if he lives alone, patient states he does. Asked patient who takes care of him at home, patient states he takes care of himself.  Asked patient if he walks at home, patient states he does.  Sitter states he ambulated to the bathroom tonight.  Patient states that he does not remember if he walked to the bathroom or not, that his legs are "weak". This conversation was reported to behavioral health regarding his ability to walk for their admittance screening.

## 2012-06-16 NOTE — ED Notes (Signed)
Pt requested something for nerves so ativan was administered at 1624.

## 2012-06-16 NOTE — BHH Counselor (Signed)
According to notes, the patient was declined by [facility] due to medical reasons. This am he continues to hear  "voices" off and on. He remains suicidal. He is alert cooperative. Spoke with Edson Snowball at [facility], and forwarded a referral  for consideration.  Dr Adriana Simas made aware of referral.

## 2012-06-16 NOTE — ED Provider Notes (Addendum)
0330 Patient was declined by [facility]. ACT to continue with placement.  Nicoletta Dress. Colon Branch, MD 06/16/12 0501   1610 Patient remains in the ER. No behavioral issues. [facility] has declined patient. They recommended [facility] for consideration. ACT to help facilitate sending paperwork.  Awaiting placement.VSS. Cor: RRR Chest: clear to auscultation Abd: soft, non tender Psych: No c/o. Continues to endorse depression.  Nicoletta Dress. Colon Branch, MD 06/17/12 0559  Nicoletta Dress. Colon Branch, MD 06/17/12 [phone]

## 2012-06-16 NOTE — ED Notes (Signed)
Dr. Patria Mane aware pt received glucophage this morning.

## 2012-06-16 NOTE — ED Notes (Signed)
Behavioral Health has declined patient due to medical issues. States that it would be advisable for him to go to [facility].

## 2012-06-16 NOTE — ED Notes (Signed)
Pt c/o pain in chest and left shoulder.  Says is the same pain he came in with.  Notified Dr. Adriana Simas, was instructed to administer 2 percocets.

## 2012-06-17 LAB — GLUCOSE, CAPILLARY
Glucose-Capillary: 139 mg/dL — ABNORMAL HIGH (ref 70–99)
Glucose-Capillary: 88 mg/dL (ref 70–99)
Glucose-Capillary: 102 mg/dL — ABNORMAL HIGH (ref 70–99)
Glucose-Capillary: 126 mg/dL — ABNORMAL HIGH (ref 70–99)

## 2012-06-17 MED ORDER — LIDOCAINE HCL (PF) 1 % IJ SOLN
INTRAMUSCULAR | Status: AC
  Filled 2012-06-17: qty 5

## 2012-06-17 NOTE — ED Provider Notes (Signed)
Turned down by multiple facilities.  Pending placement at [facility] and [place] in [place].     Hilario Quarry, MD 06/17/12 [phone]

## 2012-06-17 NOTE — ED Notes (Signed)
Call from Christiane Ha, at [facility] stating pt had been accepted by Dr. Forrestine Him. Pt to go to unit B # to call report. [phone]

## 2012-06-17 NOTE — ED Notes (Signed)
Patient c/o generalized pain.  Notified Dr. Ignacia Palma. No orders recv'd.

## 2012-06-17 NOTE — ED Notes (Signed)
Christiane Ha called back and stated pt's insurance was out of their network and in order for pt to come he would have to pay out of pocket. Pt states he will have to go home because he does not have the means to pay

## 2012-06-17 NOTE — ED Provider Notes (Signed)
Pt reported CP, but on further evaluation, he reports similar to prior shoulder pain He is in no distress ekg reviewed and unchanged from recent Awaiting placement He has had telepsych who recommends admit.  Celexa/restoril already ordered at their request BP 137/65  Pulse 87  Temp 97.8 F (36.6 C) (Oral)  Resp 22  Ht 5\' 3"  (1.6 m)  Wt 140 lb (63.504 kg)  BMI 24.80 kg/m2  SpO2 100%    Date: 06/17/2012  Rate: 89  Rhythm: normal sinus rhythm  QRS Axis: normal  Intervals: normal  ST/T Wave abnormalities: nonspecific ST changes  Conduction Disutrbances:none  Narrative Interpretation:   Old EKG Reviewed: unchanged    Joya Gaskins, MD 06/17/12 2349

## 2012-06-17 NOTE — ED Notes (Signed)
Dr Bebe Shaggy notified of pt's c/o of chest pain. No further orders at this time

## 2012-06-17 NOTE — ED Notes (Signed)
Pt states he is experiencing some anxiety, will give 1mg  ativan per PRN order for anxiety.

## 2012-06-17 NOTE — ED Notes (Signed)
Pt aware he has been accepted at [facility] and we are still waiting on a bed to become available.

## 2012-06-17 NOTE — ED Notes (Signed)
Offered pt a shower/brush teeth. States he thinks he will but wants to wait til later this morning. Informed patient to let staff know when he is ready

## 2012-06-17 NOTE — ED Notes (Signed)
C/o left sided chest pain with nausea onset ago. States it's the same as upon admission 2 days ago. ekg done cbg done

## 2012-06-17 NOTE — Progress Notes (Addendum)
Pt continues to assert that he is afraid that he will kill himself if he returns home. He lives alone and his children are not around much so he is lonely and despondent. His plan is to cut his wrist. Contracts for safety at the moment, but says that his feelings could change "in a snap". He also continues to hear voices which he describes as singing or mumbling. Have submitted pt to [facility] for placement.

## 2012-06-18 ENCOUNTER — Other Ambulatory Visit: Payer: Self-pay

## 2012-06-18 LAB — GLUCOSE, CAPILLARY: Glucose-Capillary: 107 mg/dL — ABNORMAL HIGH (ref 70–99)

## 2012-06-18 NOTE — ED Notes (Signed)
Still awake but pain free

## 2012-06-18 NOTE — ED Notes (Signed)
Offered to help pt with bathing either in bed or shower. Pt declined both at this time. Offered fluids. Pt took sm amt water.

## 2012-06-18 NOTE — ED Notes (Signed)
Up to bathroom with escort. Moved to more comfortable hospital bed. Pt denies any pain or discomfort.

## 2012-06-18 NOTE — ED Notes (Signed)
Pt c/o sudden onset cp.  ecg ordered.  edp notified.

## 2012-06-18 NOTE — ED Notes (Signed)
Dentures, meds, and clothes sent with pt.

## 2012-06-18 NOTE — Progress Notes (Signed)
Patient stated he had right sided intermittent  Pounding chest pain of 9, RN notified.

## 2012-06-18 NOTE — ED Notes (Signed)
Pt now requesting to be d/c home.  States, "I'm fine.  I feel fine."  edp notified and at bedside.

## 2012-06-18 NOTE — ED Notes (Signed)
Reporting relief of cp after receiving inhaler and neb tx.  nad noted.

## 2012-06-18 NOTE — ED Notes (Signed)
Calm, states he feels better. His chest is "numb"

## 2012-06-18 NOTE — ED Notes (Signed)
Pt requesting something for anxiety.  PRN meds given.  nad noted.  Sitter at bedside.

## 2012-06-18 NOTE — ED Notes (Addendum)
Pt has been accepted to [facility] by Dr. Neldon Newport.  edp and pt notified.  [facility] called for transport, dispatch states they will have to verify with supervisor before transport to [place].  Awaiting return call.

## 2012-06-18 NOTE — Progress Notes (Signed)
Pt has been turned down by [facility], [facility] and [facility]. Submitted him to [facility], awaiting an answer and also submitted him to the [facility] in [place]. He has been there before and they do have him on record. Verified with [facility] and with Vilinda Boehringer that they received the faxed chart information.

## 2012-08-16 ENCOUNTER — Emergency Department (HOSPITAL_COMMUNITY): Payer: Medicare Other

## 2012-08-16 ENCOUNTER — Inpatient Hospital Stay (HOSPITAL_COMMUNITY)
Admission: EM | Admit: 2012-08-16 | Discharge: 2012-08-24 | DRG: 189 | Disposition: A | Discharge status: Home Health | Payer: Medicare Other | Attending: Family Medicine | Admitting: Family Medicine

## 2012-08-16 ENCOUNTER — Encounter (HOSPITAL_COMMUNITY): Payer: Self-pay | Admitting: Emergency Medicine

## 2012-08-16 DIAGNOSIS — F32A Depression, unspecified: Secondary | ICD-10-CM

## 2012-08-16 DIAGNOSIS — I4891 Unspecified atrial fibrillation: Secondary | ICD-10-CM

## 2012-08-16 DIAGNOSIS — F1010 Alcohol abuse, uncomplicated: Secondary | ICD-10-CM | POA: Diagnosis present

## 2012-08-16 DIAGNOSIS — E119 Type 2 diabetes mellitus without complications: Secondary | ICD-10-CM

## 2012-08-16 DIAGNOSIS — Z9981 Dependence on supplemental oxygen: Secondary | ICD-10-CM

## 2012-08-16 DIAGNOSIS — I248 Other forms of acute ischemic heart disease: Secondary | ICD-10-CM | POA: Diagnosis present

## 2012-08-16 DIAGNOSIS — Z6829 Body mass index (BMI) 29.0-29.9, adult: Secondary | ICD-10-CM

## 2012-08-16 DIAGNOSIS — M1990 Unspecified osteoarthritis, unspecified site: Secondary | ICD-10-CM | POA: Diagnosis present

## 2012-08-16 DIAGNOSIS — B9789 Other viral agents as the cause of diseases classified elsewhere: Secondary | ICD-10-CM | POA: Diagnosis present

## 2012-08-16 DIAGNOSIS — E669 Obesity, unspecified: Secondary | ICD-10-CM | POA: Diagnosis present

## 2012-08-16 DIAGNOSIS — Z720 Tobacco use: Secondary | ICD-10-CM

## 2012-08-16 DIAGNOSIS — B349 Viral infection, unspecified: Secondary | ICD-10-CM | POA: Diagnosis present

## 2012-08-16 DIAGNOSIS — E871 Hypo-osmolality and hyponatremia: Secondary | ICD-10-CM

## 2012-08-16 DIAGNOSIS — J449 Chronic obstructive pulmonary disease, unspecified: Secondary | ICD-10-CM

## 2012-08-16 DIAGNOSIS — F411 Generalized anxiety disorder: Secondary | ICD-10-CM | POA: Diagnosis present

## 2012-08-16 DIAGNOSIS — I2489 Other forms of acute ischemic heart disease: Secondary | ICD-10-CM | POA: Diagnosis present

## 2012-08-16 DIAGNOSIS — I252 Old myocardial infarction: Secondary | ICD-10-CM

## 2012-08-16 DIAGNOSIS — K224 Dyskinesia of esophagus: Secondary | ICD-10-CM

## 2012-08-16 DIAGNOSIS — J9620 Acute and chronic respiratory failure, unspecified whether with hypoxia or hypercapnia: Principal | ICD-10-CM | POA: Diagnosis present

## 2012-08-16 DIAGNOSIS — F3289 Other specified depressive episodes: Secondary | ICD-10-CM | POA: Diagnosis present

## 2012-08-16 DIAGNOSIS — N400 Enlarged prostate without lower urinary tract symptoms: Secondary | ICD-10-CM

## 2012-08-16 DIAGNOSIS — G609 Hereditary and idiopathic neuropathy, unspecified: Secondary | ICD-10-CM | POA: Diagnosis present

## 2012-08-16 DIAGNOSIS — J441 Chronic obstructive pulmonary disease with (acute) exacerbation: Secondary | ICD-10-CM

## 2012-08-16 DIAGNOSIS — R5381 Other malaise: Secondary | ICD-10-CM

## 2012-08-16 DIAGNOSIS — Z79899 Other long term (current) drug therapy: Secondary | ICD-10-CM

## 2012-08-16 DIAGNOSIS — I10 Essential (primary) hypertension: Secondary | ICD-10-CM

## 2012-08-16 DIAGNOSIS — D649 Anemia, unspecified: Secondary | ICD-10-CM

## 2012-08-16 DIAGNOSIS — F4310 Post-traumatic stress disorder, unspecified: Secondary | ICD-10-CM | POA: Diagnosis present

## 2012-08-16 DIAGNOSIS — R609 Edema, unspecified: Secondary | ICD-10-CM | POA: Diagnosis present

## 2012-08-16 DIAGNOSIS — F329 Major depressive disorder, single episode, unspecified: Secondary | ICD-10-CM | POA: Diagnosis present

## 2012-08-16 DIAGNOSIS — J9611 Chronic respiratory failure with hypoxia: Secondary | ICD-10-CM

## 2012-08-16 DIAGNOSIS — F419 Anxiety disorder, unspecified: Secondary | ICD-10-CM

## 2012-08-16 DIAGNOSIS — R45851 Suicidal ideations: Secondary | ICD-10-CM

## 2012-08-16 DIAGNOSIS — F17200 Nicotine dependence, unspecified, uncomplicated: Secondary | ICD-10-CM | POA: Diagnosis present

## 2012-08-16 HISTORY — DX: Chronic respiratory failure with hypoxia: J96.11

## 2012-08-16 HISTORY — DX: Essential (primary) hypertension: I10

## 2012-08-16 LAB — URINALYSIS, ROUTINE W REFLEX MICROSCOPIC
Bilirubin Urine: NEGATIVE
Glucose, UA: 250 mg/dL — AB
Ketones, ur: NEGATIVE mg/dL
Protein, ur: NEGATIVE mg/dL
pH: 5.5 (ref 5.0–8.0)

## 2012-08-16 LAB — COMPREHENSIVE METABOLIC PANEL
AST: 12 U/L (ref 0–37)
Albumin: 3.4 g/dL — ABNORMAL LOW (ref 3.5–5.2)
Alkaline Phosphatase: 88 U/L (ref 39–117)
Chloride: 106 mEq/L (ref 96–112)
Potassium: 3.5 mEq/L (ref 3.5–5.1)
Total Bilirubin: 0.1 mg/dL — ABNORMAL LOW (ref 0.3–1.2)

## 2012-08-16 LAB — CBC WITH DIFFERENTIAL/PLATELET
Basophils Absolute: 0 10*3/uL (ref 0.0–0.1)
Basophils Relative: 0 % (ref 0–1)
Hemoglobin: 9.9 g/dL — ABNORMAL LOW (ref 13.0–17.0)
Lymphocytes Relative: 25 % (ref 12–46)
MCHC: 34.1 g/dL (ref 30.0–36.0)
Monocytes Relative: 6 % (ref 3–12)
Neutro Abs: 7.3 10*3/uL (ref 1.7–7.7)
Neutrophils Relative %: 67 % (ref 43–77)
WBC: 10.9 10*3/uL — ABNORMAL HIGH (ref 4.0–10.5)

## 2012-08-16 LAB — GLUCOSE, CAPILLARY

## 2012-08-16 LAB — URINE MICROSCOPIC-ADD ON

## 2012-08-16 MED ORDER — FLEET ENEMA 7-19 GM/118ML RE ENEM: 1.0000 | ENEMA | Freq: Once | RECTAL | Status: AC | PRN

## 2012-08-16 MED ORDER — GUAIFENESIN ER 600 MG PO TB12
1200.0000 mg | ORAL_TABLET | Freq: Two times a day (BID) | ORAL | Status: DC
  Administered 2012-08-16 – 2012-08-24 (×16): 1200 mg via ORAL
  Filled 2012-08-16 (×16): qty 2

## 2012-08-16 MED ORDER — MORPHINE SULFATE 2 MG/ML IJ SOLN
1.0000 mg | INTRAMUSCULAR | Status: DC | PRN
  Administered 2012-08-17: 1 mg via INTRAVENOUS
  Administered 2012-08-17 – 2012-08-19 (×7): 2 mg via INTRAVENOUS
  Administered 2012-08-23: 1 mg via INTRAVENOUS
  Administered 2012-08-24: 2 mg via INTRAVENOUS
  Filled 2012-08-16 (×11): qty 1

## 2012-08-16 MED ORDER — SALINE SPRAY 0.65 % NA SOLN
1.0000 | NASAL | Status: DC | PRN
  Filled 2012-08-16: qty 44

## 2012-08-16 MED ORDER — LEVOFLOXACIN IN D5W 500 MG/100ML IV SOLN
500.0000 mg | INTRAVENOUS | Status: DC
  Administered 2012-08-17 – 2012-08-23 (×7): 500 mg via INTRAVENOUS
  Filled 2012-08-16 (×8): qty 100

## 2012-08-16 MED ORDER — BUDESONIDE-FORMOTEROL FUMARATE 160-4.5 MCG/ACT IN AERO
2.0000 | INHALATION_SPRAY | Freq: Two times a day (BID) | RESPIRATORY_TRACT | Status: DC
  Administered 2012-08-17 – 2012-08-23 (×13): 2 via RESPIRATORY_TRACT
  Filled 2012-08-16 (×2): qty 6

## 2012-08-16 MED ORDER — IPRATROPIUM BROMIDE 0.02 % IN SOLN
0.5000 mg | Freq: Once | RESPIRATORY_TRACT | Status: AC
  Administered 2012-08-16: 0.5 mg via RESPIRATORY_TRACT
  Filled 2012-08-16: qty 2.5

## 2012-08-16 MED ORDER — HYDROMORPHONE HCL PF 1 MG/ML IJ SOLN
0.5000 mg | Freq: Once | INTRAMUSCULAR | Status: AC
  Administered 2012-08-16: 1 mg via INTRAVENOUS
  Filled 2012-08-16: qty 1

## 2012-08-16 MED ORDER — INSULIN ASPART 100 UNIT/ML ~~LOC~~ SOLN: 0.0000 [IU] | Freq: Three times a day (TID) | SUBCUTANEOUS | Status: DC

## 2012-08-16 MED ORDER — SALINE SPRAY 0.65 % NA SOLN
NASAL | Status: AC
  Filled 2012-08-16: qty 44

## 2012-08-16 MED ORDER — BENZONATATE 100 MG PO CAPS
200.0000 mg | ORAL_CAPSULE | Freq: Three times a day (TID) | ORAL | Status: DC | PRN
  Administered 2012-08-17 (×2): 200 mg via ORAL
  Filled 2012-08-16 (×2): qty 2

## 2012-08-16 MED ORDER — ENOXAPARIN SODIUM 40 MG/0.4ML ~~LOC~~ SOLN
40.0000 mg | SUBCUTANEOUS | Status: DC
  Administered 2012-08-16 – 2012-08-23 (×8): 40 mg via SUBCUTANEOUS
  Filled 2012-08-16 (×7): qty 0.4

## 2012-08-16 MED ORDER — ONDANSETRON HCL 4 MG/2ML IJ SOLN
4.0000 mg | Freq: Once | INTRAMUSCULAR | Status: AC
  Administered 2012-08-16: 4 mg via INTRAVENOUS
  Filled 2012-08-16: qty 2

## 2012-08-16 MED ORDER — ONDANSETRON HCL 4 MG/2ML IJ SOLN
4.0000 mg | INTRAMUSCULAR | Status: DC | PRN
  Administered 2012-08-20 – 2012-08-23 (×3): 4 mg via INTRAVENOUS
  Filled 2012-08-16 (×3): qty 2

## 2012-08-16 MED ORDER — METFORMIN HCL 500 MG PO TABS
1000.0000 mg | ORAL_TABLET | Freq: Two times a day (BID) | ORAL | Status: DC
  Administered 2012-08-17 – 2012-08-24 (×15): 1000 mg via ORAL
  Filled 2012-08-16 (×15): qty 2

## 2012-08-16 MED ORDER — BUDESONIDE-FORMOTEROL FUMARATE 160-4.5 MCG/ACT IN AERO
INHALATION_SPRAY | RESPIRATORY_TRACT | Status: AC
  Filled 2012-08-16: qty 6

## 2012-08-16 MED ORDER — TRAZODONE HCL 50 MG PO TABS
25.0000 mg | ORAL_TABLET | Freq: Every evening | ORAL | Status: DC | PRN
  Administered 2012-08-16 – 2012-08-19 (×4): 25 mg via ORAL
  Filled 2012-08-16 (×4): qty 1

## 2012-08-16 MED ORDER — LEVOFLOXACIN IN D5W 750 MG/150ML IV SOLN
750.0000 mg | INTRAVENOUS | Status: DC
  Administered 2012-08-16: 750 mg via INTRAVENOUS
  Filled 2012-08-16: qty 150

## 2012-08-16 MED ORDER — ACETAMINOPHEN 325 MG PO TABS
650.0000 mg | ORAL_TABLET | ORAL | Status: DC | PRN
  Administered 2012-08-17: 650 mg via ORAL
  Filled 2012-08-16: qty 2

## 2012-08-16 MED ORDER — ALBUTEROL SULFATE (5 MG/ML) 0.5% IN NEBU
2.5000 mg | INHALATION_SOLUTION | RESPIRATORY_TRACT | Status: DC | PRN
  Administered 2012-08-19 – 2012-08-24 (×4): 2.5 mg via RESPIRATORY_TRACT
  Filled 2012-08-16 (×4): qty 0.5

## 2012-08-16 MED ORDER — IPRATROPIUM BROMIDE 0.02 % IN SOLN
0.5000 mg | RESPIRATORY_TRACT | Status: DC
  Administered 2012-08-16 – 2012-08-18 (×8): 0.5 mg via RESPIRATORY_TRACT
  Filled 2012-08-16 (×8): qty 2.5

## 2012-08-16 MED ORDER — TAMSULOSIN HCL 0.4 MG PO CAPS
0.4000 mg | ORAL_CAPSULE | Freq: Every day | ORAL | Status: DC
  Administered 2012-08-16 – 2012-08-23 (×8): 0.4 mg via ORAL
  Filled 2012-08-16 (×8): qty 1

## 2012-08-16 MED ORDER — ALBUTEROL SULFATE (5 MG/ML) 0.5% IN NEBU
5.0000 mg | INHALATION_SOLUTION | Freq: Once | RESPIRATORY_TRACT | Status: AC
  Administered 2012-08-16: 5 mg via RESPIRATORY_TRACT
  Filled 2012-08-16: qty 1

## 2012-08-16 MED ORDER — ALBUTEROL SULFATE (5 MG/ML) 0.5% IN NEBU
2.5000 mg | INHALATION_SOLUTION | RESPIRATORY_TRACT | Status: DC
  Administered 2012-08-16 – 2012-08-18 (×8): 2.5 mg via RESPIRATORY_TRACT
  Filled 2012-08-16 (×8): qty 0.5

## 2012-08-16 MED ORDER — METHYLPREDNISOLONE SODIUM SUCC 125 MG IJ SOLR
60.0000 mg | Freq: Four times a day (QID) | INTRAMUSCULAR | Status: DC
  Administered 2012-08-16 – 2012-08-24 (×31): 60 mg via INTRAVENOUS
  Filled 2012-08-16 (×31): qty 2

## 2012-08-16 MED ORDER — INSULIN ASPART 100 UNIT/ML ~~LOC~~ SOLN
0.0000 [IU] | Freq: Every day | SUBCUTANEOUS | Status: DC
  Administered 2012-08-16: 3 [IU] via SUBCUTANEOUS

## 2012-08-16 MED ORDER — METHYLPREDNISOLONE SODIUM SUCC 125 MG IJ SOLR
125.0000 mg | Freq: Once | INTRAMUSCULAR | Status: AC
  Administered 2012-08-16: 125 mg via INTRAVENOUS
  Filled 2012-08-16: qty 2

## 2012-08-16 MED ORDER — SORBITOL 70 % SOLN
30.0000 mL | Freq: Every day | Status: DC | PRN
  Administered 2012-08-24: 30 mL via ORAL
  Filled 2012-08-16: qty 30

## 2012-08-16 MED ORDER — POTASSIUM CHLORIDE IN NACL 20-0.9 MEQ/L-% IV SOLN
INTRAVENOUS | Status: DC
  Administered 2012-08-16 – 2012-08-20 (×4): via INTRAVENOUS
  Administered 2012-08-22: 1000 mL via INTRAVENOUS
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
## 2012-08-16 NOTE — ED Notes (Signed)
Pt c/o congestion cough with cp x 3 days.

## 2012-08-16 NOTE — H&P (Signed)
[facility] History and Physical  [name]  WGN:[id number]  DOB: March 30, 1941   DOA: 08/16/2012   PCP:   No primary provider on file.   Chief Complaint:  Shortness of breath since yesterday  HPI: [name] is an 72 y.o. male.   Elderly Caucasian gentleman with COPD, uses home nebulizers and 3 L of home oxygen, has been having intractable cough and wheezing for the past 2 days not helped by his home nebulizers. Eventually came to the emergency room and continues to be short of breath despite serial nebulizations and the hospitalist service was called to assist.  He denies fever or chills, but has been having generalized body aches; Cough is productive of white-yellow sputum.  Has been suffering with hesitancy and dysuria, which he relates to his BPH   Rewiew of Systems:   All systems negative except as marked bold or noted in the HPI;  Constitutional:  malaise, fever and chills. ;  Eyes: eye pain, redness and discharge. ;  ENMT: ear pain, hoarseness, nasal congestion, sinus pressure and sore throat. ;  Cardiovascular:  chest pain, palpitations, diaphoresis, dyspnea and peripheral edema. ;  Respiratory:  cough, hemoptysis, wheezing and stridor. ;  Gastrointestinal:  nausea, vomiting, diarrhea, constipation, abdominal pain, melena, blood in stool, hematemesis, jaundice and rectal bleeding. unusual weight loss..   Genitourinary: r frequency, dysuria, hesitancy, incontinence,flank pain and hematuria; Musculoskeletal:  back pain and neck pain.  swelling and trauma.;  Skin: .  pruritus, rash, abrasions, bruising and skin lesion.; ulcerations Neuro:  headache, lightheadedness and neck stiffness.  weakness, altered level of consciousness , altered mental status, extremity weakness, burning feet, involuntary movement, seizure and syncope.  Psych:  anxiety, depression, insomnia, tearfulness, panic attacks, hallucinations, paranoia, suicidal or homicidal ideation    Past Medical  History  Diagnosis Date  . HTN (hypertension)   . Alcohol abuse   . PTSD (post-traumatic stress disorder)   . Chronic back pain   . Depression   . Peripheral neuropathy   . DJD (degenerative joint disease)   . COPD (chronic obstructive pulmonary disease)   . Anemia   . Myocardial infarction   . Type II or unspecified type diabetes mellitus without mention of complication, not stated as uncontrolled   . Esophageal dysmotility 07/04/2011    Past Surgical History  Procedure Date  . Hernia repair     X 3  . Spinal surgeries     X 4  . Appendectomy   . Prostate surgery     Medications:  HOME MEDS: Prior to Admission medications   Medication Sig Start Date End Date Taking? Authorizing Provider  albuterol (PROVENTIL HFA) 108 (90 BASE) MCG/ACT inhaler Inhale 2 puffs into the lungs 4 (four) times daily as needed. For shortness of breath/ COPD   Yes Historical Provider, MD  albuterol (PROVENTIL) (2.5 MG/3ML) 0.083% nebulizer solution Take 2.5 mg by nebulization 2 (two) times daily.   Yes Historical Provider, MD  budesonide-formoterol (SYMBICORT) 160-4.5 MCG/ACT inhaler Inhale 2 puffs into the lungs 2 (two) times daily. 07/04/11 09/15/12 Yes Elliot Cousin, MD  ipratropium (ATROVENT) 0.02 % nebulizer solution Take 500 mcg by nebulization 2 (two) times daily as needed. For chest congestion   Yes Historical Provider, MD  metFORMIN (GLUCOPHAGE) 1000 MG tablet Take 1,000 mg by mouth 2 (two) times daily.   Yes Historical Provider, MD  sodium chloride (ALTAMIST SPRAY) 0.65 % nasal spray Place 1 spray into the nose as needed. For nasal congestion  Yes Historical Provider, MD  tiotropium (SPIRIVA) 18 MCG inhalation capsule Place 18 mcg into inhaler and inhale daily.     Yes Historical Provider, MD     Allergies:  Allergies  Allergen Reactions  . Aspirin Other (See Comments)    Stomach upset    Social History:   reports that he has been smoking.  He uses smokeless tobacco. He reports  that he drinks alcohol. He reports that he does not use illicit drugs.  Family History: Family History  Problem Relation Age of Onset  . Lung disease Mother   . Prostate cancer Father      Physical Exam: Filed Vitals:   08/16/12 1700 08/16/12 1800 08/16/12 1900 08/16/12 2039  BP: 156/65 147/72 132/79 156/57  Pulse: 85 90 100 84  Temp:    97.5 F (36.4 C)  TempSrc:    Oral  Resp: 16 16 19 24   Height:    5' 3.5" (1.613 m)  Weight:    66.7 kg (147 lb 0.8 oz)  SpO2: 100% 99% 96% 99%   Blood pressure 156/57, pulse 84, temperature 97.5 F (36.4 C), temperature source Oral, resp. rate 24, height 5' 3.5" (1.613 m), weight 66.7 kg (147 lb 0.8 oz), SpO2 99.00%.  GEN:  Pleasant Caucasian gentleman  in the stretcher in  moderate respiratory  distress; cooperative with exam PSYCH:  alert and oriented x4;  appears a little anxious ; affect is appropriate. HEENT: Mucous membranes pink and anicteric; PERRLA; EOM intact; no cervical lymphadenopathy nor thyromegaly or carotid bruit; no JVD; Breasts:: Not examined CHEST WALL: No tenderness CHEST:  tachypneic diffuse bilateral expiratory wheezes and rhonchi  HEART: Regular rate and rhythm; no murmurs rubs or gallops BACK: mild kyphosis no CVA tenderness ABDOMEN: Obese, soft non-tender; no masses, no organomegaly, normal abdominal bowel sounds; no pannus; no intertriginous candida. Rectal Exam: Not done EXTREMITIES:  age-appropriate arthropathy of the hands and knees; no edema; no ulcerations. Genitalia: not examined PULSES: 2+ and symmetric SKIN: Normal hydration no rash or ulceration CNS: Cranial nerves 2-12 grossly intact no focal lateralizing neurologic deficit   Labs on Admission:  Basic Metabolic Panel:  Lab 08/16/12 4010  NA 140  K 3.5  CL 106  CO2 22  GLUCOSE 100*  BUN 13  CREATININE 0.93  CALCIUM 9.2  MG --  PHOS --   Liver Function Tests:  Lab 08/16/12 1628  AST 12  ALT 11  ALKPHOS 88  BILITOT 0.1*  PROT 6.6    ALBUMIN 3.4*   No results found for this basename: LIPASE:5,AMYLASE:5 in the last 168 hours No results found for this basename: AMMONIA:5 in the last 168 hours CBC:  Lab 08/16/12 1628  WBC 10.9*  NEUTROABS 7.3  HGB 9.9*  HCT 29.0*  MCV 90.1  PLT 233   Cardiac Enzymes: No results found for this basename: CKTOTAL:5,CKMB:5,CKMBINDEX:5,TROPONINI:5 in the last 168 hours BNP: No components found with this basename: POCBNP:5 D-dimer: No components found with this basename: D-DIMER:5 CBG: No results found for this basename: GLUCAP:5 in the last 168 hours  Radiological Exams on Admission: Dg Chest Port 1 View  08/16/2012  *RADIOLOGY REPORT*  Clinical Data: Chest pain, shortness of breath.  PORTABLE CHEST - 1 VIEW  Comparison: 06/15/2012  Findings: Somewhat prominent perihilar and bibasilar interstitial markings as before.  No confluent airspace infiltrate or overt edema.  No effusion.  Atheromatous aorta.  Heart size normal.  IMPRESSION:  1.  Stable chest   Original Report Authenticated By: D.  Andria Rhein, MD      Assessment/Plan Present on Admission:  . COPD with exacerbation . Viral syndrome . DM type 2 (diabetes mellitus, type 2) . HTN (hypertension) . BPH (benign prostatic hyperplasia) . Depression . Anxiety . Tobacco abuse . Anemia    PLAN:  We'll admit this gentleman for continued treatment of his COPD, with Solu-Medrol, albuterol and Atrovent, and will give empiric.  Although his influenza swab is negative if he is not improving or deteriorates we may add Tamiflu to his regimen. Continuous home antidiabetic medication and add a sliding scale  Trial of tamsulosin because of his BPH symptoms; Counseled on nicotine cessation Other plans as per orders.  Code Status: FULL CODE Disposition Plan:  depending on response to therapy    Adden Strout Nocturnist [facility] Pager [phone]   08/16/2012, 8:47 PM

## 2012-08-17 ENCOUNTER — Encounter (HOSPITAL_COMMUNITY): Payer: Self-pay | Admitting: Internal Medicine

## 2012-08-17 DIAGNOSIS — J9611 Chronic respiratory failure with hypoxia: Secondary | ICD-10-CM | POA: Diagnosis present

## 2012-08-17 LAB — CBC
HCT: 27.9 % — ABNORMAL LOW (ref 39.0–52.0)
Hemoglobin: 9.3 g/dL — ABNORMAL LOW (ref 13.0–17.0)
MCH: 30.4 pg (ref 26.0–34.0)
MCHC: 33.3 g/dL (ref 30.0–36.0)
MCV: 91.2 fL (ref 78.0–100.0)
RDW: 15.1 % (ref 11.5–15.5)

## 2012-08-17 LAB — COMPREHENSIVE METABOLIC PANEL
Albumin: 3.2 g/dL — ABNORMAL LOW (ref 3.5–5.2)
Alkaline Phosphatase: 81 U/L (ref 39–117)
BUN: 18 mg/dL (ref 6–23)
Creatinine, Ser: 1.02 mg/dL (ref 0.50–1.35)
GFR calc Af Amer: 84 mL/min — ABNORMAL LOW (ref >90)
Glucose, Bld: 176 mg/dL — ABNORMAL HIGH (ref 70–99)
Total Protein: 6.3 g/dL (ref 6.0–8.3)

## 2012-08-17 LAB — HEMOGLOBIN A1C
Hgb A1c MFr Bld: 5.6 % (ref <5.7)
Mean Plasma Glucose: 114 mg/dL (ref <117)

## 2012-08-17 LAB — GLUCOSE, CAPILLARY
Glucose-Capillary: 177 mg/dL — ABNORMAL HIGH (ref 70–99)
Glucose-Capillary: 176 mg/dL — ABNORMAL HIGH (ref 70–99)

## 2012-08-17 LAB — IRON AND TIBC
Saturation Ratios: 33 % (ref 20–55)
TIBC: 355 ug/dL (ref 215–435)
UIBC: 239 ug/dL (ref 125–400)

## 2012-08-17 MED ORDER — NICOTINE 7 MG/24HR TD PT24
7.0000 mg | MEDICATED_PATCH | Freq: Every day | TRANSDERMAL | Status: DC
  Administered 2012-08-17 – 2012-08-24 (×8): 7 mg via TRANSDERMAL
  Filled 2012-08-17 (×17): qty 1

## 2012-08-17 MED ORDER — PANTOPRAZOLE SODIUM 40 MG PO TBEC
40.0000 mg | DELAYED_RELEASE_TABLET | Freq: Every day | ORAL | Status: DC
  Administered 2012-08-17 – 2012-08-24 (×8): 40 mg via ORAL
  Filled 2012-08-17 (×8): qty 1

## 2012-08-17 MED ORDER — INSULIN ASPART 100 UNIT/ML ~~LOC~~ SOLN
0.0000 [IU] | Freq: Three times a day (TID) | SUBCUTANEOUS | Status: DC
Start: 2012-08-17 — End: 2012-08-24
  Administered 2012-08-17 – 2012-08-18 (×4): 4 [IU] via SUBCUTANEOUS
  Administered 2012-08-18 – 2012-08-20 (×7): 3 [IU] via SUBCUTANEOUS
  Administered 2012-08-21 (×2): 4 [IU] via SUBCUTANEOUS
  Administered 2012-08-21 – 2012-08-23 (×5): 3 [IU] via SUBCUTANEOUS
  Administered 2012-08-23: 4 [IU] via SUBCUTANEOUS
  Administered 2012-08-24 (×2): 3 [IU] via SUBCUTANEOUS

## 2012-08-17 MED ORDER — OXYCODONE-ACETAMINOPHEN 5-325 MG PO TABS
1.0000 | ORAL_TABLET | ORAL | Status: DC | PRN
  Administered 2012-08-17 – 2012-08-23 (×18): 1 via ORAL
  Filled 2012-08-17 (×18): qty 1

## 2012-08-17 MED ORDER — INSULIN ASPART 100 UNIT/ML ~~LOC~~ SOLN: 0.0000 [IU] | Freq: Every day | SUBCUTANEOUS | Status: DC

## 2012-08-17 NOTE — Progress Notes (Signed)
UR Chart Review Completed  

## 2012-08-17 NOTE — Clinical Social Work Psychosocial (Signed)
Clinical Social Work Department BRIEF PSYCHOSOCIAL ASSESSMENT 08/17/2012  Patient:  [name], [name]     Account Number:  [id number]     Admit date:  08/16/2012  Clinical Social Worker:  Nancie Neas  Date/Time:  08/17/2012 03:10 PM  Referred by:  Physician  Date Referred:  08/17/2012 Referred for  ALF Placement   Other Referral:   Interview type:  Patient Other interview type:    PSYCHOSOCIAL DATA Living Status:  ALONE Admitted from facility:   Level of care:   Primary support name:  Windy Fast Primary support relationship to patient:  CHILD, ADULT Degree of support available:   limited    CURRENT CONCERNS Current Concerns  Post-Acute Placement   Other Concerns:    SOCIAL WORK ASSESSMENT / PLAN CSW met with pt at bedside following referral from MD for possible placement. Pt alert and oriented and reports he lives alone. He has two children but said they are not very involved. Pt described difficulty in managing at home on his own. He is on chronic oxygen. Pt does not drive and relies on a cab to take him to necessary appointments. PT evaluated pt today and recommendation is for ALF/home health. Pt was very agreeable to ALF when CSW discussed this option. ALF list provided. Pt has MQB Medicaid per caseworker at [facility].    CSW began process of initiating ALF referral and went back to discuss history of suicidal ideation, PTSD, and ETOH use. When CSW returned to room, he stated he had changed his mind and would like to return home. Pt said, "I would never last in a facility and would leave to go home." Pt would like to have home health. CM notified. Pt addressed ETOH use and said he has a very long history of drinking, but reports no drinking in past several months. Pt has diagnosis of PTSD which he reports is from 13 months in [place]. He sees Dr. Regino Schultze who prescribes his medication. Pt states he is prescribed Paxil and Xanax. He also goes to the [place] [facility] in [place] where he  sees a psychiatrist. A few months ago he was seen in hospital and referred to [facility] due to suicidal ideation. No SI or HI currently.   Assessment/plan status:  Referral to [facility] Other assessment/ plan:   Information/referral to community resources[facility]   [facility]  CM for home health    PATIENT'S/FAMILY'S RESPONSE TO PLAN OF CARE: Pt appreciative of CSW visit. Although originally interested in ALF, pt now feels he can return home with home health. CSW suggested home health SW in case pt feels differently upon return home. CSW signing off but can be reconsulted if needed.        Derenda Fennel, [place] [phone]

## 2012-08-17 NOTE — Care Management Note (Addendum)
    Page 1 of 2   08/24/2012     3:12:26 PM   CARE MANAGEMENT NOTE 08/24/2012  Patient:  [name], [name]   Account Number:  [id number]  Date Initiated:  08/10/2012  Documentation initiated by:  Sharrie Rothman  Subjective/Objective Assessment:   Pt admitted from home with COPD exacerbation. Pt lives alone and has 2 sons who are not very active in the care of the pt. Pt has a cane for home use. Pt is requesting help at discharge and is agreeable to SNF if recommended by PT.     Action/Plan:   PT consult. CSW is aware of pts request. Will continue to follow for [facility] needs if pt discharges home.   Anticipated DC Date:  08/20/2012   Anticipated DC Plan:  SKILLED NURSING FACILITY  In-house referral  Clinical Social Worker      DC Planning Services  CM consult      Choice offered to / List presented to:          [facility] arranged  HH-1 RN      [facility] agency  [facility]   Status of service:  Completed, signed off Medicare Important Message given?  YES (If response is "NO", the following Medicare IM given date fields will be blank) Date Medicare IM given:  08/24/2012 Date Additional Medicare IM given:    Discharge Disposition:    Per UR Regulation:    If discussed at Long Length of Stay Meetings, dates discussed:   08/24/2012    Comments:  08/24/12 Rosemary Holms RN BSN CM Pt states he has not transportation. RCM provided [employer] number for ride home. Pt also does not have O2 canaster here also since he was brought in by EMS. [employer]( his home O2 supplier) called and will bring a canister to hospital room for pt to take home.  08/23/12 Pearly Apachito Leanord Hawking RN BSN CM Ambulating in hall on O2.  Pt does not recall having HH in past but records indicate [facility] previously  1/16 14 Ninetta Adelstein Leanord Hawking RN BSN CM  08/17/12 1030 Arlyss Queen, RN BSN CM

## 2012-08-17 NOTE — Evaluation (Signed)
Physical Therapy Evaluation Patient Details Name: [name] MRN: [id number] DOB: 05/01/1941 Today's Date: 08/17/2012 Time: 1135-1205 PT Time Calculation (min): 30 min  PT Assessment / Plan / Recommendation Clinical Impression  Pt was seen for eval and found to be close to baseline functional status.  He states that he lives alone and has been having great difficutly caring for himself.  He is O2 dependent and is deconditioned at baseline.  He would benefit from ACLF at d/c.    PT Assessment  Patent does not need any further PT services    Follow Up Recommendations  No PT follow up    Does the patient have the potential to tolerate intense rehabilitation      Barriers to Discharge        Equipment Recommendations       Recommendations for Other Services     Frequency      Precautions / Restrictions Precautions Precautions: None Restrictions Weight Bearing Restrictions: No   Pertinent Vitals/Pain       Mobility  Bed Mobility Bed Mobility: Supine to Sit;Sit to Supine Supine to Sit: 6: Modified independent (Device/Increase time);HOB flat Sit to Supine: 6: Modified independent (Device/Increase time);HOB flat Transfers Transfers: Sit to Stand;Stand to Sit Sit to Stand: 7: Independent;From bed;With upper extremity assist Stand to Sit: 7: Independent;To bed;With upper extremity assist Ambulation/Gait Ambulation/Gait Assistance: 6: Modified independent (Device/Increase time) Ambulation Distance (Feet): 150 Feet Assistive device: Rolling walker Gait Pattern: Decreased dorsiflexion - left;Within Functional Limits Gait velocity: slow but steady General Gait Details: pt uses a cane for gait but usually holds onto furniture with his other hand.  For this reason, he needs a walker for gait.  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  The walker ensures gait stability. Stairs: No Wheelchair Mobility Wheelchair Mobility: No    Shoulder  Instructions     Exercises     PT Diagnosis:    PT Problem List:   PT Treatment Interventions:     PT Goals    Visit Information  Last PT Received On: 08/17/12    Subjective Data  Subjective: I cannot go home and take care of myself Patient Stated Goal: needs to live with others   Prior Functioning  Home Living Lives With: Alone Available Help at Discharge: Friend(s) Type of Home: House Home Access: Stairs to enter [employer] of Steps: 3 Entrance Stairs-Rails: Right;Left;Can reach both Home Layout: One level Bathroom Shower/Tub: Engineer, manufacturing systems: Standard Home Adaptive Equipment: Walker - rolling;Straight cane;Bedside commode/3-in-1 Prior Function Level of Independence: Independent with assistive device(s) Able to Take Stairs?: Yes Driving: Yes Vocation: Retired Musician: No difficulties    Cognition  Overall Cognitive Status: Appears within functional limits for tasks assessed/performed Arousal/Alertness: Awake/alert Orientation Level: Appears intact for tasks assessed Behavior During Session: [facility] for tasks performed    Extremity/Trunk Assessment Right Upper Extremity Assessment RUE ROM/Strength/Tone: Within functional levels Left Upper Extremity Assessment LUE ROM/Strength/Tone: Within functional levels Right Lower Extremity Assessment RLE ROM/Strength/Tone: Within functional levels Left Lower Extremity Assessment LLE ROM/Strength/Tone: Deficits LLE ROM/Strength/Tone Deficits: pt does have weakness in anterior tibialis and has a short leg brace for which he is supposed to wear.   Balance    End of Session PT - End of Session Equipment Utilized During Treatment: Gait belt Activity Tolerance: Patient tolerated treatment well Patient left: in bed;with call bell/phone within reach;with bed alarm set  GP  [name] L 08/17/2012, 12:12 PM

## 2012-08-17 NOTE — Progress Notes (Deleted)
Current Facility-Administered Medications  Medication Dose Route Frequency Provider Last Rate Last Dose  . 0.9 % NaCl with KCl 20 mEq/ L  infusion   Intravenous Continuous Vania Rea, MD 50 mL/hr at 08/16/12 2230    . acetaminophen (TYLENOL) tablet 650 mg  650 mg Oral Q4H PRN Vania Rea, MD   650 mg at 08/17/12 0106  . albuterol (PROVENTIL) (5 MG/ML) 0.5% nebulizer solution 2.5 mg  2.5 mg Nebulization Q4H WA Vania Rea, MD   2.5 mg at 08/17/12 0707  . albuterol (PROVENTIL) (5 MG/ML) 0.5% nebulizer solution 2.5 mg  2.5 mg Nebulization Q2H PRN Vania Rea, MD      . benzonatate (TESSALON) capsule 200 mg  200 mg Oral TID PRN Vania Rea, MD   200 mg at 08/17/12 0232  . budesonide-formoterol (SYMBICORT) 160-4.5 MCG/ACT inhaler 2 puff  2 puff Inhalation BID Vania Rea, MD      . enoxaparin (LOVENOX) injection 40 mg  40 mg Subcutaneous Q24H Vania Rea, MD   40 mg at 08/16/12 2229  . guaiFENesin (MUCINEX) 12 hr tablet 1,200 mg  1,200 mg Oral BID Vania Rea, MD   1,200 mg at 08/16/12 2228  . insulin aspart (novoLOG) injection 0-20 Units  0-20 Units Subcutaneous TID WC Elliot Cousin, MD   4 Units at 08/17/12 0813  . insulin aspart (novoLOG) injection 0-5 Units  0-5 Units Subcutaneous QHS Elliot Cousin, MD      . ipratropium (ATROVENT) nebulizer solution 0.5 mg  0.5 mg Nebulization Q4H WA Vania Rea, MD   0.5 mg at 08/17/12 0707  . levofloxacin (LEVAQUIN) IVPB 500 mg  500 mg Intravenous Q24H Vania Rea, MD      . metFORMIN (GLUCOPHAGE) tablet 1,000 mg  1,000 mg Oral BID WC Vania Rea, MD   1,000 mg at 08/17/12 0815  . methylPREDNISolone sodium succinate (SOLU-MEDROL) 125 mg/2 mL injection 60 mg  60 mg Intravenous Q6H Vania Rea, MD   60 mg at 08/17/12 0230  . morphine 2 MG/ML injection 1-2 mg  1-2 mg Intravenous Q3H PRN Vania Rea, MD   1 mg at 08/17/12 0451  . ondansetron (ZOFRAN) injection 4 mg  4 mg Intravenous Q4H PRN Vania Rea, MD      . pantoprazole (PROTONIX) EC tablet 40 mg  40 mg Oral Daily Elliot Cousin, MD      . sodium chloride (OCEAN) 0.65 % nasal spray 1 spray  1 spray Nasal PRN Vania Rea, MD      . sorbitol 70 % solution 30 mL  30 mL Oral Daily PRN Vania Rea, MD      . Tamsulosin HCl [facility]) capsule 0.4 mg  0.4 mg Oral QPC supper Vania Rea, MD   0.4 mg at 08/16/12 2228  . traZODone (DESYREL) tablet 25 mg  25 mg Oral QHS PRN Vania Rea, MD   25 mg at 08/16/12 2233

## 2012-08-17 NOTE — Progress Notes (Signed)
Subjective: The patient says "I feel rough ". He complains of shortness of breath and chest congestion, but admits he is breathing a little bit better compared to last night. He continues to smoke at least one to 2 cigarettes daily. He complains of neck pain and explains that he's had neck surgery in the past for arthritis.  Objective: Vital signs in last 24 hours: Filed Vitals:   08/16/12 2144 08/17/12 0147 08/17/12 0457 08/17/12 0805  BP:  137/62 136/50   Pulse:  85 80   Temp:  97.5 F (36.4 C) 97.3 F (36.3 C)   TempSrc:  Oral Oral   Resp:  20 20   Height:      Weight:   68.2 kg (150 lb 5.7 oz)   SpO2: 97% 100% 98% 98%    Intake/Output Summary (Last 24 hours) at 08/17/12 1032 Last data filed at 08/17/12 9604  Gross per 24 hour  Intake   1195 ml  Output   1100 ml  Net     95 ml    Weight change:   Physical exam: General: Debilitated-appearing short statured 72 year old Caucasian man sitting up in bed, in no acute distress. Lungs: Faint expiratory wheezes and long expiratory phase. Heart: S1, S2, no murmurs rubs or gallops. Abdomen: Positive bowel sounds, soft, nontender, nondistended. Extremities: Trace of pedal edema. Neurologic: He is alert and oriented x2. Cranial nerves II through XII are intact.  Lab Results: Basic Metabolic Panel:  Basename 08/17/12 0503 08/16/12 1628  NA 139 140  K 3.9 3.5  CL 106 106  CO2 20 22  GLUCOSE 176* 100*  BUN 18 13  CREATININE 1.02 0.93  CALCIUM 9.1 9.2  MG -- --  PHOS -- --   Liver Function Tests:  Basename 08/17/12 0503 08/16/12 1628  AST 11 12  ALT 10 11  ALKPHOS 81 88  BILITOT 0.1* 0.1*  PROT 6.3 6.6  ALBUMIN 3.2* 3.4*   No results found for this basename: LIPASE:2,AMYLASE:2 in the last 72 hours No results found for this basename: AMMONIA:2 in the last 72 hours CBC:  Basename 08/17/12 0503 08/16/12 1628  WBC 9.5 10.9*  NEUTROABS -- 7.3  HGB 9.3* 9.9*  HCT 27.9* 29.0*  MCV 91.2 90.1  PLT 210 233   Cardiac  Enzymes: No results found for this basename: CKTOTAL:3,CKMB:3,CKMBINDEX:3,TROPONINI:3 in the last 72 hours BNP: No results found for this basename: PROBNP:3 in the last 72 hours D-Dimer: No results found for this basename: DDIMER:2 in the last 72 hours CBG:  Basename 08/17/12 0742 08/16/12 2133  GLUCAP 166* 257*   Hemoglobin A1C: No results found for this basename: HGBA1C in the last 72 hours Fasting Lipid Panel: No results found for this basename: CHOL,HDL,LDLCALC,TRIG,CHOLHDL,LDLDIRECT in the last 72 hours Thyroid Function Tests: No results found for this basename: TSH,T4TOTAL,FREET4,T3FREE,THYROIDAB in the last 72 hours Anemia Panel: No results found for this basename: VITAMINB12,FOLATE,FERRITIN,TIBC,IRON,RETICCTPCT in the last 72 hours Coagulation: No results found for this basename: LABPROT:2,INR:2 in the last 72 hours Urine Drug Screen: Drugs of Abuse     Component Value Date/Time   LABOPIA NONE DETECTED 06/15/2012 1715   COCAINSCRNUR NONE DETECTED 06/15/2012 1715   LABBENZ NONE DETECTED 06/15/2012 1715   AMPHETMU NONE DETECTED 06/15/2012 1715   THCU NONE DETECTED 06/15/2012 1715   LABBARB NONE DETECTED 06/15/2012 1715    Alcohol Level:  Basename 08/16/12 1628  ETH <11   Urinalysis:  Basename 08/16/12 2127  COLORURINE YELLOW  LABSPEC 1.025  PHURINE 5.5  GLUCOSEU 250*  HGBUR MODERATE*  BILIRUBINUR NEGATIVE  KETONESUR NEGATIVE  PROTEINUR NEGATIVE  UROBILINOGEN 0.2  NITRITE NEGATIVE  LEUKOCYTESUR NEGATIVE   Misc. Labs:   Micro: No results found for this or any previous visit (from the past 240 hour(s)).  Studies/Results: Dg Chest Port 1 View  08/16/2012  *RADIOLOGY REPORT*  Clinical Data: Chest pain, shortness of breath.  PORTABLE CHEST - 1 VIEW  Comparison: 06/15/2012  Findings: Somewhat prominent perihilar and bibasilar interstitial markings as before.  No confluent airspace infiltrate or overt edema.  No effusion.  Atheromatous aorta.  Heart size  normal.  IMPRESSION:  1.  Stable chest   Original Report Authenticated By: D. Andria Rhein, MD     Medications:  Scheduled:   . albuterol  2.5 mg Nebulization Q4H WA  . budesonide-formoterol  2 puff Inhalation BID  . enoxaparin (LOVENOX) injection  40 mg Subcutaneous Q24H  . guaiFENesin  1,200 mg Oral BID  . insulin aspart  0-20 Units Subcutaneous TID WC  . insulin aspart  0-5 Units Subcutaneous QHS  . ipratropium  0.5 mg Nebulization Q4H WA  . levofloxacin (LEVAQUIN) IV  500 mg Intravenous Q24H  . metFORMIN  1,000 mg Oral BID WC  . methylPREDNISolone (SOLU-MEDROL) injection  60 mg Intravenous Q6H  . nicotine  7 mg Transdermal Daily  . pantoprazole  40 mg Oral Daily  . Tamsulosin HCl  0.4 mg Oral QPC supper   Continuous:   . 0.9 % NaCl with KCl 20 mEq / L 50 mL/hr at 08/16/12 2230   ZOX:[id number], albuterol, benzonatate, morphine injection, ondansetron (ZOFRAN) IV, oxyCODONE-acetaminophen, sodium chloride, sorbitol, traZODone  Assessment: Principal Problem:  *COPD with exacerbation Active Problems:  DM type 2 (diabetes mellitus, type 2)  HTN (hypertension)  BPH (benign prostatic hyperplasia)  Depression  Anxiety  Tobacco abuse  Anemia  Viral syndrome   1. Oxygen-dependent COPD with exacerbation. He does not appear to be in extremis. He continues to smoke. He was strongly advised to stop smoking completely. He requests a nicotine patch which was granted. We'll continue IV Levaquin, bronchodilators with Atrovent/albuterol nebulizer, Symbicort, and Solu-Medrol. His influenza PCR is negative.  Type 2 diabetes mellitus. His capillary blood glucose is reasonable. Continue metformin. I have already titrated to sliding scale NovoLog up to the resistive scale.Will adjust accordingly.  Anemia. Etiology unknown. Anemia panel ordered. Protonix started empirically.  Hypertension. Apparently he takes no chronic medications for this. Currently controlled.  Depression/anxiety. This  appears to be stable. He appears to take no pharmacological medications for this.  Disposition. The patient desires assistance at home and/or transfer to a skilled nursing facility or assisted living facility. He says that it is becoming increasingly difficult to take care of himself because of his physical condition.   Plan:  1. Continue current management. 2. Consult PT. 3. Order tobacco cessation counseling. Nicotine replacement therapy has been ordered already. 4. Anemia studies ordered and pending.   LOS: 1 day   Julio Zappia 08/17/2012, 10:32 AM

## 2012-08-18 ENCOUNTER — Encounter (HOSPITAL_COMMUNITY): Payer: Self-pay | Admitting: Cardiology

## 2012-08-18 ENCOUNTER — Inpatient Hospital Stay (HOSPITAL_COMMUNITY): Payer: Medicare Other

## 2012-08-18 DIAGNOSIS — J449 Chronic obstructive pulmonary disease, unspecified: Secondary | ICD-10-CM

## 2012-08-18 DIAGNOSIS — R0902 Hypoxemia: Secondary | ICD-10-CM

## 2012-08-18 DIAGNOSIS — F17200 Nicotine dependence, unspecified, uncomplicated: Secondary | ICD-10-CM

## 2012-08-18 DIAGNOSIS — R5381 Other malaise: Secondary | ICD-10-CM | POA: Diagnosis present

## 2012-08-18 DIAGNOSIS — I4891 Unspecified atrial fibrillation: Secondary | ICD-10-CM

## 2012-08-18 DIAGNOSIS — E119 Type 2 diabetes mellitus without complications: Secondary | ICD-10-CM

## 2012-08-18 DIAGNOSIS — J9610 Chronic respiratory failure, unspecified whether with hypoxia or hypercapnia: Secondary | ICD-10-CM

## 2012-08-18 DIAGNOSIS — I10 Essential (primary) hypertension: Secondary | ICD-10-CM

## 2012-08-18 LAB — FOLATE: Folate: 12.3 ng/mL

## 2012-08-18 LAB — GLUCOSE, CAPILLARY
Glucose-Capillary: 153 mg/dL — ABNORMAL HIGH (ref 70–99)
Glucose-Capillary: 134 mg/dL — ABNORMAL HIGH (ref 70–99)
Glucose-Capillary: 121 mg/dL — ABNORMAL HIGH (ref 70–99)
Glucose-Capillary: 129 mg/dL — ABNORMAL HIGH (ref 70–99)

## 2012-08-18 LAB — RETICULOCYTES
RBC.: 2.97 MIL/uL — ABNORMAL LOW (ref 4.22–5.81)
Retic Count, Absolute: 68.3 10*3/uL (ref 19.0–186.0)

## 2012-08-18 MED ORDER — DILTIAZEM HCL 25 MG/5ML IV SOLN
INTRAVENOUS | Status: AC
  Filled 2012-08-18: qty 5

## 2012-08-18 MED ORDER — DILTIAZEM HCL 100 MG IV SOLR
5.0000 mg/h | INTRAVENOUS | Status: DC
  Administered 2012-08-18: 10 mg/h via INTRAVENOUS
  Administered 2012-08-18 – 2012-08-19 (×2): 15 mg/h via INTRAVENOUS
  Filled 2012-08-18: qty 100

## 2012-08-18 MED ORDER — LEVALBUTEROL HCL 0.63 MG/3ML IN NEBU
0.6300 mg | INHALATION_SOLUTION | Freq: Four times a day (QID) | RESPIRATORY_TRACT | Status: DC
  Administered 2012-08-18 – 2012-08-24 (×19): 0.63 mg via RESPIRATORY_TRACT
  Filled 2012-08-18 (×17): qty 3

## 2012-08-18 MED ORDER — CYANOCOBALAMIN 1000 MCG/ML IJ SOLN
1000.0000 ug | Freq: Every day | INTRAMUSCULAR | Status: AC
  Administered 2012-08-18 – 2012-08-19 (×2): 1000 ug via INTRAMUSCULAR
  Filled 2012-08-18 (×2): qty 1

## 2012-08-18 MED ORDER — NITROGLYCERIN 0.4 MG SL SUBL
SUBLINGUAL_TABLET | SUBLINGUAL | Status: AC
  Administered 2012-08-18: 0.4 mg
  Filled 2012-08-18: qty 25

## 2012-08-18 MED ORDER — DILTIAZEM HCL 25 MG/5ML IV SOLN
10.0000 mg | Freq: Once | INTRAVENOUS | Status: AC
  Administered 2012-08-18: 10 mg via INTRAVENOUS

## 2012-08-18 MED ORDER — DILTIAZEM LOAD VIA INFUSION
10.0000 mg | Freq: Once | INTRAVENOUS | Status: AC
  Administered 2012-08-18: 10 mg via INTRAVENOUS
  Filled 2012-08-18: qty 10

## 2012-08-18 MED ORDER — IPRATROPIUM BROMIDE 0.02 % IN SOLN
0.5000 mg | Freq: Four times a day (QID) | RESPIRATORY_TRACT | Status: DC
  Administered 2012-08-18 – 2012-08-24 (×21): 0.5 mg via RESPIRATORY_TRACT
  Filled 2012-08-18 (×20): qty 2.5

## 2012-08-18 NOTE — Progress Notes (Signed)
Subjective: The patient says he is breathing a little but better today, but he does not feel well enough to go home. He still become was very short of breath with ambulation. I asked him if he was short of breath all the time and he stated yes, but he is more short of breath than usual.  Objective: Vital signs in last 24 hours: Filed Vitals:   08/17/12 1956 08/17/12 2104 08/18/12 0556 08/18/12 0655  BP:  133/44 139/50   Pulse:  97 95   Temp:  97.7 F (36.5 C) 97.7 F (36.5 C)   TempSrc:  Oral Oral   Resp:  18 16   Height:      Weight:   69.2 kg (152 lb 8.9 oz)   SpO2: 99% 98% 99% 98%    Intake/Output Summary (Last 24 hours) at 08/18/12 6213 Last data filed at 08/18/12 0813  Gross per 24 hour  Intake 1951.67 ml  Output   2475 ml  Net -523.33 ml    Weight change: 7.964 kg (17 lb 8.9 oz)  Physical exam: General: Debilitated-appearing short statured 72 year old Caucasian man sitting up in bed, in no acute distress. Lungs: Faint expiratory wheezes and long expiratory phase. Heart: S1, S2, no murmurs rubs or gallops. Abdomen: Positive bowel sounds, soft, nontender, nondistended. Extremities: Trace of pedal edema. Neurologic: He is alert and oriented x2. Cranial nerves II through XII are intact.  Lab Results: Basic Metabolic Panel:  Basename 08/17/12 0503 08/16/12 1628  NA 139 140  K 3.9 3.5  CL 106 106  CO2 20 22  GLUCOSE 176* 100*  BUN 18 13  CREATININE 1.02 0.93  CALCIUM 9.1 9.2  MG -- --  PHOS -- --   Liver Function Tests:  Basename 08/17/12 0503 08/16/12 1628  AST 11 12  ALT 10 11  ALKPHOS 81 88  BILITOT 0.1* 0.1*  PROT 6.3 6.6  ALBUMIN 3.2* 3.4*   No results found for this basename: LIPASE:2,AMYLASE:2 in the last 72 hours No results found for this basename: AMMONIA:2 in the last 72 hours CBC:  Basename 08/17/12 0503 08/16/12 1628  WBC 9.5 10.9*  NEUTROABS -- 7.3  HGB 9.3* 9.9*  HCT 27.9* 29.0*  MCV 91.2 90.1  PLT 210 233   Cardiac Enzymes: No  results found for this basename: CKTOTAL:3,CKMB:3,CKMBINDEX:3,TROPONINI:3 in the last 72 hours BNP: No results found for this basename: PROBNP:3 in the last 72 hours D-Dimer: No results found for this basename: DDIMER:2 in the last 72 hours CBG:  Basename 08/18/12 0720 08/17/12 2101 08/17/12 1638 08/17/12 1106 08/17/12 0742 08/16/12 2133  GLUCAP 153* 176* 177* 178* 166* 257*   Hemoglobin A1C:  Basename 08/16/12 2114  HGBA1C 5.6   Fasting Lipid Panel: No results found for this basename: CHOL,HDL,LDLCALC,TRIG,CHOLHDL,LDLDIRECT in the last 72 hours Thyroid Function Tests:  [facility] 08/16/12 2114  TSH 2.334  T4TOTAL --  FREET4 --  T3FREE --  THYROIDAB --   Anemia Panel:  Basename 08/18/12 0434 08/17/12 0838  VITAMINB12 -- 217  FOLATE -- --  FERRITIN -- --  TIBC -- 355  IRON -- 116  RETICCTPCT 2.3 --   Coagulation: No results found for this basename: LABPROT:2,INR:2 in the last 72 hours Urine Drug Screen: Drugs of Abuse     Component Value Date/Time   LABOPIA NONE DETECTED 06/15/2012 1715   COCAINSCRNUR NONE DETECTED 06/15/2012 1715   LABBENZ NONE DETECTED 06/15/2012 1715   AMPHETMU NONE DETECTED 06/15/2012 1715   THCU NONE DETECTED 06/15/2012 1715  LABBARB NONE DETECTED 06/15/2012 1715    Alcohol Level:  Basename 08/16/12 1628  ETH <11   Urinalysis:  Basename 08/16/12 2127  COLORURINE YELLOW  LABSPEC 1.025  PHURINE 5.5  GLUCOSEU 250*  HGBUR MODERATE*  BILIRUBINUR NEGATIVE  KETONESUR NEGATIVE  PROTEINUR NEGATIVE  UROBILINOGEN 0.2  NITRITE NEGATIVE  LEUKOCYTESUR NEGATIVE   Misc. Labs:   Micro: No results found for this or any previous visit (from the past 240 hour(s)).  Studies/Results: Dg Chest Port 1 View  08/16/2012  *RADIOLOGY REPORT*  Clinical Data: Chest pain, shortness of breath.  PORTABLE CHEST - 1 VIEW  Comparison: 06/15/2012  Findings: Somewhat prominent perihilar and bibasilar interstitial markings as before.  No confluent airspace  infiltrate or overt edema.  No effusion.  Atheromatous aorta.  Heart size normal.  IMPRESSION:  1.  Stable chest   Original Report Authenticated By: D. Andria Rhein, MD     Medications:  Scheduled:    . albuterol  2.5 mg Nebulization Q4H WA  . budesonide-formoterol  2 puff Inhalation BID  . enoxaparin (LOVENOX) injection  40 mg Subcutaneous Q24H  . guaiFENesin  1,200 mg Oral BID  . insulin aspart  0-20 Units Subcutaneous TID WC  . insulin aspart  0-5 Units Subcutaneous QHS  . ipratropium  0.5 mg Nebulization Q4H WA  . levofloxacin (LEVAQUIN) IV  500 mg Intravenous Q24H  . metFORMIN  1,000 mg Oral BID WC  . methylPREDNISolone (SOLU-MEDROL) injection  60 mg Intravenous Q6H  . nicotine  7 mg Transdermal Daily  . pantoprazole  40 mg Oral Daily  . Tamsulosin HCl  0.4 mg Oral QPC supper   Continuous:    . 0.9 % NaCl with KCl 20 mEq / L 50 mL/hr at 08/17/12 2214   ZOX:[id number], albuterol, benzonatate, morphine injection, ondansetron (ZOFRAN) IV, oxyCODONE-acetaminophen, sodium chloride, sorbitol, traZODone  Assessment: Principal Problem:  *COPD with exacerbation Active Problems:  DM type 2 (diabetes mellitus, type 2)  HTN (hypertension)  BPH (benign prostatic hyperplasia)  Depression  Anxiety  Tobacco abuse  Anemia  Chronic respiratory failure with hypoxia  Physical deconditioning   1. Chronic hypoxic respiratory failure/Oxygen-dependent COPD with exacerbation. He does not appear to be in extremis he appears to be improving clinically. At baseline, he is chronically short of breath, so it is difficult to distinguish what is his baseline. He continues to smoke. He was strongly advised to stop smoking completely. He requested a nicotine patch which was granted. We'll continue IV Levaquin, bronchodilators with Atrovent/albuterol nebulizer, Symbicort, and Solu-Medrol. His influenza PCR is negative.  Type 2 diabetes mellitus. His capillary blood glucose is reasonable. Continue  metformin. I have already titrated to sliding scale NovoLog up to the resistive scale.Will adjust accordingly.  Anemia. Etiology unknown. Anemia panel is relatively normal, but his vitamin B12 level is borderline low. We'll give him a couple doses of IM vitamin B12. Protonix started empirically.  Hypertension. Apparently he takes no chronic medications for this. Currently controlled.  Depression/anxiety. This appears to be stable. He appears to take no pharmacological medications for this.  Chronic physical deconditioning. PT evaluation noted and appreciated.  Disposition. The patient initially desired assisted living placement or skilled nursing facility placement short-term if he qualified however, he changed his mind and wants to go home. Physical therapy recommended no specific PT followup. The patient may benefit from a registered nurse followup at home or home health aide if she qualifies. This will be discussed with the case manager.   Plan:  1. Anticipate 1 more day of IV Solu-Medrol and Levaquin along with other treatment for COPD with exacerbation. 2. We'll give him vitamin B12 injections today and tomorrow. 3. Disposition: Possible discharge tomorrow with home health services if he is eligible.   LOS: 2 days   [name] 08/18/2012, 8:52 AM

## 2012-08-18 NOTE — Consult Note (Signed)
Requesting physician: Dr. Elliot Cousin Reason for consultation: SVT  Clinical Summary [name] is a 72 y.o.male with past medical history outlined below, admitted to the hospital with COPD exacerbation associated with oxygen-dependent hypoxic respiratory failure. He is being managed with antibiotics and bronchodilators, was noted to have elevated heart rates earlier this afternoon in the 130s, ultimately up to 150s, with ECG suggestive of rapid atrial fibrillation. Telemetry also confirms this but does show some regularity at times, question atypical atrial flutter with heart rates in the 150s to 160s. He has been moved to the intensive care unit for further management, and is just now being initiated on intravenous Cardizem. He reports a sense of chest discomfort with rapid heart rate.. ECG does show fairly diffuse ST segment depression, could be rate related, however cannot exclude associated ischemic change. His cardiac history is not well-defined, reports previous "heart attack."   Allergies  Allergen Reactions  . Aspirin Other (See Comments)    Stomach upset    Medications    . budesonide-formoterol  2 puff Inhalation BID  . cyanocobalamin  1,000 mcg Intramuscular Daily  . diltiazem  10 mg Intravenous Once  . enoxaparin (LOVENOX) injection  40 mg Subcutaneous Q24H  . guaiFENesin  1,200 mg Oral BID  . insulin aspart  0-20 Units Subcutaneous TID WC  . insulin aspart  0-5 Units Subcutaneous QHS  . ipratropium  0.5 mg Nebulization Q6H  . levalbuterol  0.63 mg Nebulization Q6H  . levofloxacin (LEVAQUIN) IV  500 mg Intravenous Q24H  . metFORMIN  1,000 mg Oral BID WC  . methylPREDNISolone (SOLU-MEDROL) injection  60 mg Intravenous Q6H  . nicotine  7 mg Transdermal Daily  . pantoprazole  40 mg Oral Daily  . Tamsulosin HCl  0.4 mg Oral QPC supper    Past Medical History  Diagnosis Date  . Essential hypertension, benign   . Alcohol abuse   . PTSD (post-traumatic stress  disorder)   . Chronic back pain   . Depression   . Peripheral neuropathy   . DJD (degenerative joint disease)   . COPD (chronic obstructive pulmonary disease)   . Anemia   . Myocardial infarction   . Type II or unspecified type diabetes mellitus without mention of complication, not stated as uncontrolled   . Esophageal dysmotility 07/04/2011  . Chronic respiratory failure with hypoxia     Past Surgical History  Procedure Date  . Hernia repair     X 3  . Spinal surgeries     X 4  . Appendectomy   . Prostate surgery     Family History  Problem Relation Age of Onset  . Lung disease Mother   . Prostate cancer Father     Social History Mr. Fife reports that he has been smoking Cigarettes.  He has been smoking about .5 packs per day. He uses smokeless tobacco. Mr. Gros reports that he drinks alcohol.  Review of Systems Chronically short of breath with cough and "just can't get stuff out of my lungs." He continues to smoke cigarettes. No obvious angina at baseline. Has NYHA class III dyspnea. No orthopnea or PND. Does report intermittent palpitations over time particularly when his respiratory status worsens. Otherwise negative  Physical Examination Blood pressure 129/71, pulse 158, temperature 97.7 F (36.5 C), temperature source Oral, resp. rate 18, height 5' 3.5" (1.613 m), weight 152 lb 8.9 oz (69.2 kg), SpO2 98.00%. Weight change: 17 lb 8.9 oz (7.964 kg)  Chronically ill-appearing male in  no acute distress. HEENT: Conjunctiva and lids normal, oropharynx clear with poor dentition. Neck: Supple, no elevated JVP or carotid bruits, no thyromegaly. Lungs: Decreased breath sounds with prolonged expiratory phase, scattered rhonchi, and an expiratory wheeze, mildly labored breathing at rest. Cardiac: Rapid irregular rhythm, no S3, 1/6 systolic murmur, no pericardial rub. Abdomen: Soft, nontender, bowel sounds present, no guarding or rebound. Extremities: No pitting edema,  distal pulses 1+. Skin: Warm and dry. Musculoskeletal: No kyphosis. Neuropsychiatric: Alert and oriented x3, affect grossly appropriate.  Lab Results Basic Metabolic Panel:  Lab 08/17/12 1610 08/16/12 1628  NA 139 140  K 3.9 3.5  CL 106 106  CO2 20 22  GLUCOSE 176* 100*  BUN 18 13  CREATININE 1.02 0.93  CALCIUM 9.1 9.2  MG -- --  PHOS -- --   Liver Function Tests:  Lab 08/17/12 0503 08/16/12 1628  AST 11 12  ALT 10 11  ALKPHOS 81 88  BILITOT 0.1* 0.1*  PROT 6.3 6.6  ALBUMIN 3.2* 3.4*   CBC:  Lab 08/17/12 0503 08/16/12 1628  WBC 9.5 10.9*  NEUTROABS -- 7.3  HGB 9.3* 9.9*  HCT 27.9* 29.0*  MCV 91.2 90.1  PLT 210 233   CBG:  Lab 08/18/12 1103 08/18/12 0720 08/17/12 2101 08/17/12 1638 08/17/12 1106  GLUCAP 134* 153* 176* 177* 178*    Imaging Chest x-ray 1/15 IMPRESSION:  1. Slight increase in pulmonary vascular congestion and mild bibasilar atelectasis. 2. Emphysema.   Impression  1. SVT, likely rapid atrial fibrillation versus atypical atrial flutter. Onset earlier this afternoon in association with severe COPD and hypoxic respiratory failure. He reports intermittent palpitations when his "lungs get bad" suggesting paroxysmal arrhythmia over time. CHADS2 score is 2. In review of his chart, it is noted that he does not have a primary care physician, has history of alcohol abuse, also depression and PTSD with previous suicidal ideation. I would not be inclined to pursue anticoagulant therapy without first establishing a regular followup pattern. Main goal at this point should be rate control, aching spontaneous conversion as his lungs improve.  2. Severe COPD, oxygen dependent, with hypoxic respiratory failure, currently on antibiotics and bronchodilators.  3. Reported history of previous "heart attack," details not clear. He is reporting chest pain, has ST segment depression that could be rate related, however cannot exclude associated ischemic change. Cardiac  markers will be cycled.  4. Hypertension, blood pressure stable with current arrhythmia.  Recommendations  Discussed with Dr. Sherrie Mustache and reviewed with patient. Agree with initiation of intravenous Cardizem. Additional 10 mg bolus was given in the unit prior to starting a drip at 5 mg per hour. Would uptitrate as needed. Could in addition consider amiodarone infusion if Cardizem is not adequate, as this may also help to aid conversion to sinus rhythm. As noted above, I would not pursue full dose anticoagulation unless this gentleman is able to establish a regular followup pattern. Would cycle cardiac markers, and consider a followup echocardiogram once his heart rate is better controlled. Our service will follow with you.  Jonelle Sidle, M.D., F.A.C.C.

## 2012-08-18 NOTE — Progress Notes (Signed)
Report called to [place], Rn in ICU. Patient transported to ICU

## 2012-08-19 DIAGNOSIS — I517 Cardiomegaly: Secondary | ICD-10-CM

## 2012-08-19 LAB — PRO B NATRIURETIC PEPTIDE: Pro B Natriuretic peptide (BNP): 5740 pg/mL — ABNORMAL HIGH (ref 0–125)

## 2012-08-19 LAB — TROPONIN I: Troponin I: 0.33 ng/mL (ref <0.30)

## 2012-08-19 LAB — GLUCOSE, CAPILLARY
Glucose-Capillary: 133 mg/dL — ABNORMAL HIGH (ref 70–99)
Glucose-Capillary: 136 mg/dL — ABNORMAL HIGH (ref 70–99)

## 2012-08-19 MED ORDER — FUROSEMIDE 10 MG/ML IJ SOLN
40.0000 mg | Freq: Once | INTRAMUSCULAR | Status: AC
  Administered 2012-08-19: 40 mg via INTRAVENOUS
  Filled 2012-08-19: qty 4

## 2012-08-19 MED ORDER — DILTIAZEM HCL 60 MG PO TABS
60.0000 mg | ORAL_TABLET | Freq: Four times a day (QID) | ORAL | Status: DC
  Administered 2012-08-19 – 2012-08-23 (×17): 60 mg via ORAL
  Filled 2012-08-19 (×17): qty 1

## 2012-08-19 MED ORDER — ALPRAZOLAM 0.25 MG PO TABS
0.2500 mg | ORAL_TABLET | Freq: Three times a day (TID) | ORAL | Status: DC | PRN
  Administered 2012-08-19 – 2012-08-23 (×6): 0.25 mg via ORAL
  Filled 2012-08-19 (×6): qty 1

## 2012-08-19 NOTE — Progress Notes (Signed)
SUBJECTIVE:Contimies dyspnea and chest discomfort. Converted overnight from atrial fib to NSR.   Principal Problem:  *COPD with exacerbation Active Problems:  DM type 2 (diabetes mellitus, type 2)  HTN (hypertension)  BPH (benign prostatic hyperplasia)  Depression  Anxiety  Tobacco abuse  Anemia  Chronic respiratory failure with hypoxia  Physical deconditioning  Atrial fibrillation  LABS: Basic Metabolic Panel:  Basename 08/17/12 0503 08/16/12 1628  NA 139 140  K 3.9 3.5  CL 106 106  CO2 20 22  GLUCOSE 176* 100*  BUN 18 13  CREATININE 1.02 0.93  CALCIUM 9.1 9.2  MG -- --  PHOS -- --   Liver Function Tests:  [facility] 08/17/12 0503 08/16/12 1628  AST 11 12  ALT 10 11  ALKPHOS 81 88  BILITOT 0.1* 0.1*  PROT 6.3 6.6  ALBUMIN 3.2* 3.4*   CBC:  Basename 08/17/12 0503 08/16/12 1628  WBC 9.5 10.9*  NEUTROABS -- 7.3  HGB 9.3* 9.9*  HCT 27.9* 29.0*  MCV 91.2 90.1  PLT 210 233   Cardiac Enzymes:  Basename 08/19/12 0423 08/18/12 2211 08/18/12 1701  CKTOTAL -- -- --  CKMB -- -- --  CKMBINDEX -- -- --  TROPONINI 0.33* <0.30 <0.30   Hemoglobin A1C:  Basename 08/16/12 2114  HGBA1C 5.6   Thyroid Function Tests:  Basename 08/16/12 2114  TSH 2.334  T4TOTAL --  T3FREE --  THYROIDAB --    RADIOLOGY: Dg Chest Port 1 View  08/18/2012  *RADIOLOGY REPORT*  Clinical Data: Chest pain.  COPD.  PORTABLE CHEST - 1 VIEW  Comparison: One-view chest to 08/16/2012.  Findings: The heart size is normal.  There is slight increase in pulmonary vascular congestion since the prior exam.  Minimal bibasilar atelectasis is evident.  The upper lung fields are clear. Emphysematous changes are again noted.  IMPRESSION:  1.  Slight increase in pulmonary vascular congestion and mild bibasilar atelectasis. 2.  Emphysema.   Original Report Authenticated By: Marin Roberts, M.D.    Echocardiogram  08/18/2012: Mild to moderate LVH with normal LV systolic function and no significant  valvular abnormalities.  PHYSICAL EXAM BP 126/53  Pulse 152  Temp 97.8 F (36.6 C) (Oral)  Resp 20  Ht 5' 3.5" (1.613 m)  Wt 154 lb 15.7 oz (70.3 kg)  BMI 27.02 kg/m2  SpO2 97% General: Well developed, well nourished, in no acute distress Head: Eyes PERRLA, No xanthomas.   Normal cephalic and atramatic  Lungs: Inspiratory/expiratory wheezes and rales, prolonged expiratory phase.  Heart: HRIR S1 S2, No MRG .  Pulses are 2+ & equal.            No carotid bruit. No JVD.  No abdominal bruits. No femoral bruits. Abdomen: Bowel sounds are positive, abdomen soft and non-tender without masses or                  Hernia's noted. Mildly distended. Msk:  Back normal, normal gait. Overall diminished strength and tone for age. Extremities: No clubbing, cyanosis or edema.  DP +1 Neuro: Alert and oriented X 3. Psych:  Good affect, responds appropriately  TELEMETRY: Reviewed telemetry pt in: NSR with PAC's.   ASSESSMENT AND PLAN:  1. Atrial fibrillation: Converted to NSR overnight on diltiazem gtt at 5 mg./hr only. No digoxin was started. Echocardiogram is pending today. He is not a coumadin candidate due to noncompliance and history of liminted follow up, with history of ETOH abuse. Transitioned to PO Cardizem per PTH.    2.  Chest Pain: Multifactorial. He describes pain radiating from his back and down his left arm. Chronic back issues that he states are in need of surgical repair. No documentation of this. He has  CVRF to include hypertension, diabetes, tobacco abuse,  age with unclear history of MI in the remote past. Would benefit from ischemic work up at some point once he is stable.  3. COPD exacerbation: Continues to have resting dyspnea with frequent coughing, wheezing and rales. He is on respiratory inhalation treatments and antibiotics per pulmonary. CXR demonstrates pulmonary vascular congestion. Pro-BNP 5740 this am. Lasix has been started per PTH at 40 mg X1.  Will make more  recommendations once echocardiogram is read.   4.Ongoing tobacco abuse 5, History of ETOH abuse 6. Anemia 7. DJD 8. Diabetes 9. Anxiety and Depression  Bettey Mare. Lyman Bishop NP [facility] 08/19/2012, 8:11 AM  Cardiology Attending Patient interviewed and examined. Discussed with Joni Reining, NP.  Above note annotated and modified based upon my findings.  Immediate problem is COPD exacerbation. Atrial fibrillation has resolved, and patient has few risk factors for thromboembolism. His chest discomfort is atypical and likely related to chronic cervical spine disease. We will follow as needed while he is in hospital and subsequently in our office.  Echelon Bing, MD 08/19/2012, 5:51 PM

## 2012-08-19 NOTE — Progress Notes (Signed)
Troponin of 0.33 paged to on call doctor. No reports or c/o chest pain or shortness of breath

## 2012-08-19 NOTE — Progress Notes (Signed)
[facility]             Progress Note   Subjective: Patient still feels short of breath, more than his baseline.  He is coughing.  He feels generally weak.  Objective: Vital signs in last 24 hours: Temp:  [97.6 F (36.4 C)-98.8 F (37.1 C)] 98.4 F (36.9 C) (01/16 0800) Pulse Rate:  [97-158] 152  (01/15 1600) Resp:  [16-26] 20  (01/16 0600) BP: (102-163)/(50-131) 126/53 mmHg (01/16 0600) SpO2:  [96 %-98 %] 97 % (01/16 0707) Weight:  [69.1 kg (152 lb 5.4 oz)-70.3 kg (154 lb 15.7 oz)] 70.3 kg (154 lb 15.7 oz) (01/16 0500) Weight change: -0.1 kg (-3.5 oz) Last BM Date: 08/17/12  Intake/Output from previous day: 01/15 0701 - 01/16 0700 In: 4135.8 [P.O.:1800; I.V.:2235.8; IV Piggyback:100] Out: 975 [Urine:975] Total I/O In: 240 [P.O.:240] Out: 300 [Urine:300]   Physical Exam: General: Alert, awake, oriented x3, Patient appears to have increased work of breathing. HEENT: No bruits, no goiter. Heart: Regular rate and rhythm, without murmurs, rubs, gallops. Lungs: b/l exp wheezes Abdomen: Soft, nontender, nondistended, positive bowel sounds. Extremities: No clubbing cyanosis or edema with positive pedal pulses. Neuro: Grossly intact, nonfocal.    Lab Results: Basic Metabolic Panel:  Basename 08/17/12 0503 08/16/12 1628  NA 139 140  K 3.9 3.5  CL 106 106  CO2 20 22  GLUCOSE 176* 100*  BUN 18 13  CREATININE 1.02 0.93  CALCIUM 9.1 9.2  MG -- --  PHOS -- --   Liver Function Tests:  Basename 08/17/12 0503 08/16/12 1628  AST 11 12  ALT 10 11  ALKPHOS 81 88  BILITOT 0.1* 0.1*  PROT 6.3 6.6  ALBUMIN 3.2* 3.4*   No results found for this basename: LIPASE:2,AMYLASE:2 in the last 72 hours No results found for this basename: AMMONIA:2 in the last 72 hours CBC:  Basename 08/17/12 0503 08/16/12 1628  WBC 9.5 10.9*  NEUTROABS -- 7.3  HGB 9.3* 9.9*  HCT 27.9* 29.0*  MCV 91.2 90.1  PLT 210 233   Cardiac Enzymes:  Basename 08/19/12 0423 08/18/12  2211 08/18/12 1701  CKTOTAL -- -- --  CKMB -- -- --  CKMBINDEX -- -- --  TROPONINI 0.33* <0.30 <0.30   BNP:  Basename 08/19/12 0423  PROBNP 5740.0*   D-Dimer: No results found for this basename: DDIMER:2 in the last 72 hours CBG:  Basename 08/19/12 0735 08/18/12 2116 08/18/12 1655 08/18/12 1103 08/18/12 0720 08/17/12 2101  GLUCAP 133* 129* 121* 134* 153* 176*   Hemoglobin A1C:  Basename 08/16/12 2114  HGBA1C 5.6   Fasting Lipid Panel: No results found for this basename: CHOL,HDL,LDLCALC,TRIG,CHOLHDL,LDLDIRECT in the last 72 hours Thyroid Function Tests:  [facility] 08/16/12 2114  TSH 2.334  T4TOTAL --  FREET4 --  T3FREE --  THYROIDAB --   Anemia Panel:  Basename 08/18/12 0434 08/17/12 0838  VITAMINB12 -- 217  FOLATE 12.3 --  FERRITIN 72 --  TIBC -- 355  IRON -- 116  RETICCTPCT 2.3 --   Coagulation: No results found for this basename: LABPROT:2,INR:2 in the last 72 hours Urine Drug Screen: Drugs of Abuse     Component Value Date/Time   LABOPIA NONE DETECTED 06/15/2012 1715   COCAINSCRNUR NONE DETECTED 06/15/2012 1715   LABBENZ NONE DETECTED 06/15/2012 1715   AMPHETMU NONE DETECTED 06/15/2012 1715   THCU NONE DETECTED 06/15/2012 1715   LABBARB NONE DETECTED 06/15/2012 1715    Alcohol Level:  Basename 08/16/12 1628  ETH <11  Urinalysis:  Basename 08/16/12 2127  COLORURINE YELLOW  LABSPEC 1.025  PHURINE 5.5  GLUCOSEU 250*  HGBUR MODERATE*  BILIRUBINUR NEGATIVE  KETONESUR NEGATIVE  PROTEINUR NEGATIVE  UROBILINOGEN 0.2  NITRITE NEGATIVE  LEUKOCYTESUR NEGATIVE    Recent Results (from the past 240 hour(s))  MRSA PCR SCREENING     Status: Normal   Collection Time   08/18/12  4:30 PM      Component Value Range Status Comment   MRSA by PCR NEGATIVE  NEGATIVE Final     Studies/Results: Dg Chest Port 1 View  08/18/2012  *RADIOLOGY REPORT*  Clinical Data: Chest pain.  COPD.  PORTABLE CHEST - 1 VIEW  Comparison: One-view chest to 08/16/2012.   Findings: The heart size is normal.  There is slight increase in pulmonary vascular congestion since the prior exam.  Minimal bibasilar atelectasis is evident.  The upper lung fields are clear. Emphysematous changes are again noted.  IMPRESSION:  1.  Slight increase in pulmonary vascular congestion and mild bibasilar atelectasis. 2.  Emphysema.   Original Report Authenticated By: Marin Roberts, M.D.     Medications: Scheduled Meds:   . budesonide-formoterol  2 puff Inhalation BID  . cyanocobalamin  1,000 mcg Intramuscular Daily  . enoxaparin (LOVENOX) injection  40 mg Subcutaneous Q24H  . guaiFENesin  1,200 mg Oral BID  . insulin aspart  0-20 Units Subcutaneous TID WC  . insulin aspart  0-5 Units Subcutaneous QHS  . ipratropium  0.5 mg Nebulization Q6H  . levalbuterol  0.63 mg Nebulization Q6H  . levofloxacin (LEVAQUIN) IV  500 mg Intravenous Q24H  . metFORMIN  1,000 mg Oral BID WC  . methylPREDNISolone (SOLU-MEDROL) injection  60 mg Intravenous Q6H  . nicotine  7 mg Transdermal Daily  . pantoprazole  40 mg Oral Daily  . Tamsulosin HCl  0.4 mg Oral QPC supper   Continuous Infusions:   . 0.9 % NaCl with KCl 20 mEq / L 50 mL/hr at 08/19/12 0600  . diltiazem (CARDIZEM) infusion 15 mg/hr (08/19/12 0600)   PRN Meds:.acetaminophen, albuterol, benzonatate, morphine injection, ondansetron (ZOFRAN) IV, oxyCODONE-acetaminophen, sodium chloride, sorbitol, traZODone  Assessment/Plan:  Principal Problem:  *COPD with exacerbation Active Problems:  DM type 2 (diabetes mellitus, type 2)  HTN (hypertension)  BPH (benign prostatic hyperplasia)  Depression  Anxiety  Tobacco abuse  Anemia  Chronic respiratory failure with hypoxia  Physical deconditioning  Atrial fibrillation  Chronic hypoxic respiratory failure/Oxygen-dependent COPD with exacerbation. At baseline, he is chronically short of breath, so it is difficult to distinguish what is his baseline. He continues to smoke. He was  strongly advised to stop smoking completely. He requested a nicotine patch which was granted. We'll continue IV Levaquin, bronchodilators with Atrovent/albuterol nebulizer, Symbicort, and Solu-Medrol. His influenza PCR is negative. He is on maximum therapy for copd exacerbation.  Atrial fibrillation.  Patient was noted to be tachycardic yesterday and required transfer to the ICU for rate control.  He was started on a diltiazem infusion.  He has since converted to sinus rhythm. Cardiology is following, appreciate assistance. It is not felt that he is an appropriate candidate for anticoagulation due to compliance issues and poor follow up.  Will transition the patient to oral cardizem.  Avoid beta blocker in setting of severe copd.   Elevated BNP.  Patient found to have elevated bnp at 5740 and chest xray shows pulmonary vascular congestion.  Will give one dose of lasix today.  Check echocardiogram for LV function.  Elevated Troponin. Likely  due to demand ischemia due to elevated heart rate.  Patient has not had any chest pain.  Checking echo.  Type 2 diabetes mellitus. His capillary blood glucose is reasonable. Continue metformin. I have already titrated to sliding scale NovoLog up to the resistive scale.Will adjust accordingly.   Anemia. Etiology unknown. Anemia panel is relatively normal, but his vitamin B12 level is borderline low. We'll give him a couple doses of IM vitamin B12. Protonix started empirically.   Hypertension. Apparently he takes no chronic medications for this. Currently controlled.   Depression/anxiety. This appears to be stable. He appears to take no pharmacological medications for this.   Chronic physical deconditioning. PT evaluation noted and appreciated.   Disposition. The patient initially desired assisted living placement or skilled nursing facility placement short-term if he qualified however, he changed his mind and wants to go home. Physical therapy recommended no  specific PT followup. The patient may benefit from a registered nurse followup at home or home health aide if he qualifies. This will be discussed with the case manager.   Time spent coordinating care:   LOS: 3 days   Roselyne Stalnaker [facility] Pager: [phone] 08/19/2012, 9:26 AM

## 2012-08-19 NOTE — Progress Notes (Signed)
*  PRELIMINARY RESULTS* Echocardiogram 2D Echocardiogram has been performed.  [name] 08/19/2012, 3:02 PM

## 2012-08-20 LAB — BASIC METABOLIC PANEL
BUN: 30 mg/dL — ABNORMAL HIGH (ref 6–23)
GFR calc non Af Amer: 68 mL/min — ABNORMAL LOW (ref >90)
Glucose, Bld: 136 mg/dL — ABNORMAL HIGH (ref 70–99)
Potassium: 3.5 mEq/L (ref 3.5–5.1)

## 2012-08-20 LAB — CBC
Hemoglobin: 9.2 g/dL — ABNORMAL LOW (ref 13.0–17.0)
MCH: 30.8 pg (ref 26.0–34.0)
MCHC: 33.3 g/dL (ref 30.0–36.0)

## 2012-08-20 LAB — GLUCOSE, CAPILLARY
Glucose-Capillary: 130 mg/dL — ABNORMAL HIGH (ref 70–99)
Glucose-Capillary: 139 mg/dL — ABNORMAL HIGH (ref 70–99)
Glucose-Capillary: 132 mg/dL — ABNORMAL HIGH (ref 70–99)

## 2012-08-20 NOTE — Progress Notes (Signed)
SUBJECTIVE:Complains of weakness and DOE chronic for him. Less dizzy today.  Principal Problem:  *COPD with exacerbation Active Problems:  DM type 2 (diabetes mellitus, type 2)  HTN (hypertension)  BPH (benign prostatic hyperplasia)  Depression  Anxiety  Tobacco abuse  Anemia  Chronic respiratory failure with hypoxia  Physical deconditioning  Atrial fibrillation  LABS: Basic Metabolic Panel:  Basename 08/20/12 0452  NA 138  K 3.5  CL 105  CO2 22  GLUCOSE 136*  BUN 30*  CREATININE 1.07  CALCIUM 8.9  MG --  PHOS --   CBC:  Basename 08/20/12 0452  WBC 8.1  NEUTROABS --  HGB 9.2*  HCT 27.6*  MCV 92.3  PLT 171   Cardiac Enzymes:  Basename 08/19/12 0423 08/18/12 2211 08/18/12 1701  CKTOTAL -- -- --  CKMB -- -- --  CKMBINDEX -- -- --  TROPONINI 0.33* <0.30 <0.30   RADIOLOGY: Dg Chest Port 1 View  08/18/2012  *RADIOLOGY REPORT*  Clinical Data: Chest pain.  COPD.  PORTABLE CHEST - 1 VIEW  Comparison: One-view chest to 08/16/2012.  Findings: The heart size is normal.  There is slight increase in pulmonary vascular congestion since the prior exam.  Minimal bibasilar atelectasis is evident.  The upper lung fields are clear. Emphysematous changes are again noted.  IMPRESSION:  1.  Slight increase in pulmonary vascular congestion and mild bibasilar atelectasis. 2.  Emphysema.   Original Report Authenticated By: Marin Roberts, M.D.    Echocardiogram 08/19/12 Left ventricle: The cavity size was normal. There was mild to moderate concentric hypertrophy. Systolic function was normal. The estimated ejection fraction was in the range of 55% to 60%. Wall motion was normal; there were no regional wall motion abnormalities. - Aortic valve: Mildly calcified annulus. Trileaflet. - Mitral valve: Calcified annulus. - Left atrium: The atrium was mildly dilated. - Right atrium: The atrium was mildly dilated. - Atrial septum: No defect or patent foramen ovale  was identified. Impressions: Compared to the prior study performed 12/28/07, some interval LA and RA enlargement.  PHYSICAL EXAM BP 120/56  Pulse 103  Temp 97.5 F (36.4 C) (Axillary)  Resp 15  Ht 5' 3.5" (1.613 m)  Wt 156 lb 15.5 oz (71.2 kg)  BMI 27.37 kg/m2  SpO2 98% General: Well developed, well nourished, in no acute distress Head: Eyes PERRLA, No xanthomas.   Normal cephalic and atramatic  Lungs: Clear bilaterally to auscultation and percussion. Heart: HRRR S1 S2, No MRG .  Pulses are 2+ & equal.            No carotid bruit. No JVD.  No abdominal bruits. No femoral bruits. Abdomen: Bowel sounds are positive, abdomen soft and non-tender without masses or                  Hernia's noted. Msk:  Back normal, normal gait. Normal strength and tone for age. Extremities: No clubbing, cyanosis or edema.  DP +1 Neuro: Alert and oriented X 3. Psych:  Good affect, responds appropriately  TELEMETRY: Reviewed telemetry pt in: NSR 70-100's  ASSESSMENT AND PLAN: 1. Atrial fibrillation: Continues in NSR on cardizem 60 mg QID with good rate control.  He is not a coumadin candidate due to noncompliance and history of liminted follow up, with history of ETOH abuse.  Can change to long acting CCB on discharge. Can follow as OP. Planned appt 09/03/12 at 1 pm in our [place] office.  2. Chest Pain: Multifactorial. He describes pain radiating from his back  and down his left arm, but has no more complaints today.  Chronic back issues that he states are in need of surgical repair. No documentation of this. He has CVRF to include hypertension, diabetes, tobacco abuse, age with unclear history of MI in the remote past. Would benefit from ischemic work up at some point once he is stable.   3. COPD exacerbation: Continues to have resting dyspnea with frequent coughing, wheezing and rales. He is on respiratory inhalation treatments and antibiotics per pulmonary. CXR demonstrates pulmonary vascular  congestion. Lasix 40 mg IV given yesterday. With 2000 cc output, but still positive on I/O echocardiogram demonstrates normal systolic function without elevation is PAP. Would not given further lasix at this time.Creatinie 1.07  4.Ongoing tobacco abuse  5, History of ETOH abuse  6. Anemia  7. DJD  8. Diabetes  9. Anxiety and Depression  Bettey Mare. Lyman Bishop NP [facility] 08/20/2012, 8:04 AM  Cardiology Attending Patient interviewed and examined. Discussed with Joni Reining, NP.  Above note annotated and modified based upon my findings.  Patient is maintaining sinus rhythm. Focus of therapy during hospitalization continues to be on COPD exacerbation, as is appropriate.  [place] Bing, MD 08/20/2012, 5:44 PM

## 2012-08-20 NOTE — Progress Notes (Signed)
UR Chart Review Completed  

## 2012-08-20 NOTE — Progress Notes (Signed)
[facility]             Progress Note   Subjective: Feeling better today, able to mobilize some sputum with cough, still short of breath but feels that he may be improving.  Objective: Vital signs in last 24 hours: Temp:  [97.5 F (36.4 C)-98.1 F (36.7 C)] 97.7 F (36.5 C) (01/17 0830) Resp:  [13-27] 15  (01/17 0600) BP: (105-164)/(38-94) 120/56 mmHg (01/17 0600) SpO2:  [97 %-99 %] 99 % (01/17 0843) Weight:  [71.2 kg (156 lb 15.5 oz)] 71.2 kg (156 lb 15.5 oz) (01/17 0500) Weight change: 2.1 kg (4 lb 10.1 oz) Last BM Date: 08/17/12  Intake/Output from previous day: 01/16 0701 - 01/17 0700 In: 3196.5 [P.O.:2520; I.V.:572.5; IV Piggyback:104] Out: 2800 [Urine:2800] Total I/O In: 480 [P.O.:480] Out: 450 [Urine:450]   Physical Exam: General: Alert, awake, oriented x3, no signs of distress HEENT: No bruits, no goiter. Heart: Regular rate and rhythm, without murmurs, rubs, gallops. Lungs: b/l exp wheezes Abdomen: Soft, nontender, nondistended, positive bowel sounds. Extremities: No clubbing cyanosis or edema with positive pedal pulses. Neuro: Grossly intact, nonfocal.    Lab Results: Basic Metabolic Panel:  Basename 08/20/12 0452  NA 138  K 3.5  CL 105  CO2 22  GLUCOSE 136*  BUN 30*  CREATININE 1.07  CALCIUM 8.9  MG --  PHOS --   Liver Function Tests: No results found for this basename: AST:2,ALT:2,ALKPHOS:2,BILITOT:2,PROT:2,ALBUMIN:2 in the last 72 hours No results found for this basename: LIPASE:2,AMYLASE:2 in the last 72 hours No results found for this basename: AMMONIA:2 in the last 72 hours CBC:  Basename 08/20/12 0452  WBC 8.1  NEUTROABS --  HGB 9.2*  HCT 27.6*  MCV 92.3  PLT 171   Cardiac Enzymes:  Basename 08/19/12 0423 08/18/12 2211 08/18/12 1701  CKTOTAL -- -- --  CKMB -- -- --  CKMBINDEX -- -- --  TROPONINI 0.33* <0.30 <0.30   BNP:  Basename 08/19/12 0423  PROBNP 5740.0*   D-Dimer: No results found for this  basename: DDIMER:2 in the last 72 hours CBG:  Basename 08/20/12 0719 08/19/12 2142 08/19/12 1613 08/19/12 1130 08/19/12 0735 08/18/12 2116  GLUCAP 137* 136* 99 128* 133* 129*   Hemoglobin A1C: No results found for this basename: HGBA1C in the last 72 hours Fasting Lipid Panel: No results found for this basename: CHOL,HDL,LDLCALC,TRIG,CHOLHDL,LDLDIRECT in the last 72 hours Thyroid Function Tests: No results found for this basename: TSH,T4TOTAL,FREET4,T3FREE,THYROIDAB in the last 72 hours Anemia Panel:  Basename 08/18/12 0434  VITAMINB12 --  FOLATE 12.3  FERRITIN 72  TIBC --  IRON --  RETICCTPCT 2.3   Coagulation: No results found for this basename: LABPROT:2,INR:2 in the last 72 hours Urine Drug Screen: Drugs of Abuse     Component Value Date/Time   LABOPIA NONE DETECTED 06/15/2012 1715   COCAINSCRNUR NONE DETECTED 06/15/2012 1715   LABBENZ NONE DETECTED 06/15/2012 1715   AMPHETMU NONE DETECTED 06/15/2012 1715   THCU NONE DETECTED 06/15/2012 1715   LABBARB NONE DETECTED 06/15/2012 1715    Alcohol Level: No results found for this basename: ETH:2 in the last 72 hours Urinalysis: No results found for this basename: COLORURINE:2,APPERANCEUR:2,LABSPEC:2,PHURINE:2,GLUCOSEU:2,HGBUR:2,BILIRUBINUR:2,KETONESUR:2,PROTEINUR:2,UROBILINOGEN:2,NITRITE:2,LEUKOCYTESUR:2 in the last 72 hours  Recent Results (from the past 240 hour(s))  MRSA PCR SCREENING     Status: Normal   Collection Time   08/18/12  4:30 PM      Component Value Range Status Comment   MRSA by PCR NEGATIVE  NEGATIVE Final  Studies/Results: Dg Chest Port 1 View  08/18/2012  *RADIOLOGY REPORT*  Clinical Data: Chest pain.  COPD.  PORTABLE CHEST - 1 VIEW  Comparison: One-view chest to 08/16/2012.  Findings: The heart size is normal.  There is slight increase in pulmonary vascular congestion since the prior exam.  Minimal bibasilar atelectasis is evident.  The upper lung fields are clear. Emphysematous changes are again  noted.  IMPRESSION:  1.  Slight increase in pulmonary vascular congestion and mild bibasilar atelectasis. 2.  Emphysema.   Original Report Authenticated By: Marin Roberts, M.D.     Medications: Scheduled Meds:    . budesonide-formoterol  2 puff Inhalation BID  . diltiazem  60 mg Oral Q6H  . enoxaparin (LOVENOX) injection  40 mg Subcutaneous Q24H  . guaiFENesin  1,200 mg Oral BID  . insulin aspart  0-20 Units Subcutaneous TID WC  . insulin aspart  0-5 Units Subcutaneous QHS  . ipratropium  0.5 mg Nebulization Q6H  . levalbuterol  0.63 mg Nebulization Q6H  . levofloxacin (LEVAQUIN) IV  500 mg Intravenous Q24H  . metFORMIN  1,000 mg Oral BID WC  . methylPREDNISolone (SOLU-MEDROL) injection  60 mg Intravenous Q6H  . nicotine  7 mg Transdermal Daily  . pantoprazole  40 mg Oral Daily  . Tamsulosin HCl  0.4 mg Oral QPC supper   Continuous Infusions:    . 0.9 % NaCl with KCl 20 mEq / L 20 mL/hr at 08/20/12 0600   PRN Meds:.acetaminophen, albuterol, ALPRAZolam, benzonatate, morphine injection, ondansetron (ZOFRAN) IV, oxyCODONE-acetaminophen, sodium chloride, sorbitol, traZODone  Assessment/Plan:  Principal Problem:  *COPD with exacerbation Active Problems:  DM type 2 (diabetes mellitus, type 2)  HTN (hypertension)  BPH (benign prostatic hyperplasia)  Depression  Anxiety  Tobacco abuse  Anemia  Chronic respiratory failure with hypoxia  Physical deconditioning  Atrial fibrillation  Chronic hypoxic respiratory failure/Oxygen-dependent COPD with exacerbation. At baseline, he is chronically short of breath, so it is difficult to distinguish what is his baseline. He continues to smoke. He was strongly advised to stop smoking completely. He requested a nicotine patch which was granted. We'll continue IV Levaquin, bronchodilators with Atrovent/albuterol nebulizer, Symbicort, and Solu-Medrol. His influenza PCR is negative. He is on maximum therapy for copd exacerbation.  Atrial  fibrillation.  Patient was noted to be tachycardic and required transfer to the ICU for rate control.  He was started on a diltiazem infusion.  He has since converted to sinus rhythm and has maintained sinus on oral cardizem now. Cardiology is following, appreciate assistance. It is not felt that he is an appropriate candidate for anticoagulation due to compliance issues and poor follow up. Avoid beta blocker in setting of severe copd.   Elevated BNP.  Patient found to have elevated bnp at 5740 and chest xray shows pulmonary vascular congestion.  Received one dose of lasix on 08/19/12. EF is normal on echo.  Elevated Troponin. Likely due to demand ischemia due to elevated heart rate.  Patient has not had any chest pain.  No wall motion abnormalities on echo.  Type 2 diabetes mellitus. His capillary blood glucose is reasonable. Continue metformin. I have already titrated to sliding scale NovoLog up to the resistive scale.Will adjust accordingly.   Anemia. Etiology unknown. Anemia panel is relatively normal, but his vitamin B12 level is borderline low. We'll give him a couple doses of IM vitamin B12. Protonix started empirically.   Hypertension. Apparently he takes no chronic medications for this. Currently controlled.   Depression/anxiety. This  appears to be stable. He appears to take no pharmacological medications for this.   Chronic physical deconditioning. PT evaluation noted and appreciated.   Disposition. The patient initially desired assisted living placement or skilled nursing facility placement short-term if he qualified however, he changed his mind and wants to go home. Physical therapy recommended no specific PT followup. The patient may benefit from a registered nurse followup at home or home health aide if he qualifies. This will be discussed with the case manager. Transfer to telemetry   Time spent coordinating care:   LOS: 4 days   MEMON,JEHANZEB [facility] Pager:  [phone] 08/20/2012, 9:23 AM

## 2012-08-21 LAB — GLUCOSE, CAPILLARY: Glucose-Capillary: 169 mg/dL — ABNORMAL HIGH (ref 70–99)

## 2012-08-21 NOTE — Progress Notes (Signed)
[facility]             Progress Note   Subjective: Still short of breath and wheezing, coughing.  Objective: Vital signs in last 24 hours: Temp:  [97.5 F (36.4 C)-98.2 F (36.8 C)] 97.5 F (36.4 C) (01/18 1040) Pulse Rate:  [78-86] 86  (01/18 1040) Resp:  [16-25] 20  (01/18 1040) BP: (117-142)/(42-74) 133/58 mmHg (01/18 1040) SpO2:  [93 %-100 %] 94 % (01/18 1040) Weight:  [72.2 kg (159 lb 2.8 oz)] 72.2 kg (159 lb 2.8 oz) (01/18 0500) Weight change: 1 kg (2 lb 3.3 oz) Last BM Date: 08/17/12  Intake/Output from previous day: 01/17 0701 - 01/18 0700 In: 1279.7 [P.O.:720; I.V.:457.7; IV Piggyback:102] Out: 1000 [Urine:1000] Total I/O In: 240 [P.O.:240] Out: -    Physical Exam: General: Alert, awake, oriented x3, no signs of distress HEENT: No bruits, no goiter. Heart: Regular rate and rhythm, without murmurs, rubs, gallops. Lungs: b/l exp wheezes Abdomen: Soft, nontender, nondistended, positive bowel sounds. Extremities: No clubbing cyanosis or edema with positive pedal pulses. Neuro: Grossly intact, nonfocal.    Lab Results: Basic Metabolic Panel:  Basename 08/20/12 0452  NA 138  K 3.5  CL 105  CO2 22  GLUCOSE 136*  BUN 30*  CREATININE 1.07  CALCIUM 8.9  MG --  PHOS --   Liver Function Tests: No results found for this basename: AST:2,ALT:2,ALKPHOS:2,BILITOT:2,PROT:2,ALBUMIN:2 in the last 72 hours No results found for this basename: LIPASE:2,AMYLASE:2 in the last 72 hours No results found for this basename: AMMONIA:2 in the last 72 hours CBC:  Basename 08/20/12 0452  WBC 8.1  NEUTROABS --  HGB 9.2*  HCT 27.6*  MCV 92.3  PLT 171   Cardiac Enzymes:  Basename 08/19/12 0423 08/18/12 2211 08/18/12 1701  CKTOTAL -- -- --  CKMB -- -- --  CKMBINDEX -- -- --  TROPONINI 0.33* <0.30 <0.30   BNP:  Basename 08/19/12 0423  PROBNP 5740.0*   D-Dimer: No results found for this basename: DDIMER:2 in the last 72 hours CBG:  Basename  08/21/12 0821 08/20/12 2046 08/20/12 1649 08/20/12 1117 08/20/12 0719 08/19/12 2142  GLUCAP 167* 132* 139* 130* 137* 136*   Hemoglobin A1C: No results found for this basename: HGBA1C in the last 72 hours Fasting Lipid Panel: No results found for this basename: CHOL,HDL,LDLCALC,TRIG,CHOLHDL,LDLDIRECT in the last 72 hours Thyroid Function Tests: No results found for this basename: TSH,T4TOTAL,FREET4,T3FREE,THYROIDAB in the last 72 hours Anemia Panel: No results found for this basename: VITAMINB12,FOLATE,FERRITIN,TIBC,IRON,RETICCTPCT in the last 72 hours Coagulation: No results found for this basename: LABPROT:2,INR:2 in the last 72 hours Urine Drug Screen: Drugs of Abuse     Component Value Date/Time   LABOPIA NONE DETECTED 06/15/2012 1715   COCAINSCRNUR NONE DETECTED 06/15/2012 1715   LABBENZ NONE DETECTED 06/15/2012 1715   AMPHETMU NONE DETECTED 06/15/2012 1715   THCU NONE DETECTED 06/15/2012 1715   LABBARB NONE DETECTED 06/15/2012 1715    Alcohol Level: No results found for this basename: ETH:2 in the last 72 hours Urinalysis: No results found for this basename: COLORURINE:2,APPERANCEUR:2,LABSPEC:2,PHURINE:2,GLUCOSEU:2,HGBUR:2,BILIRUBINUR:2,KETONESUR:2,PROTEINUR:2,UROBILINOGEN:2,NITRITE:2,LEUKOCYTESUR:2 in the last 72 hours  Recent Results (from the past 240 hour(s))  MRSA PCR SCREENING     Status: Normal   Collection Time   08/18/12  4:30 PM      Component Value Range Status Comment   MRSA by PCR NEGATIVE  NEGATIVE Final     Studies/Results: No results found.  Medications: Scheduled Meds:    . budesonide-formoterol  2 puff Inhalation BID  .  diltiazem  60 mg Oral Q6H  . enoxaparin (LOVENOX) injection  40 mg Subcutaneous Q24H  . guaiFENesin  1,200 mg Oral BID  . insulin aspart  0-20 Units Subcutaneous TID WC  . insulin aspart  0-5 Units Subcutaneous QHS  . ipratropium  0.5 mg Nebulization Q6H  . levalbuterol  0.63 mg Nebulization Q6H  . levofloxacin (LEVAQUIN) IV   500 mg Intravenous Q24H  . metFORMIN  1,000 mg Oral BID WC  . methylPREDNISolone (SOLU-MEDROL) injection  60 mg Intravenous Q6H  . nicotine  7 mg Transdermal Daily  . pantoprazole  40 mg Oral Daily  . Tamsulosin HCl  0.4 mg Oral QPC supper   Continuous Infusions:    . 0.9 % NaCl with KCl 20 mEq / L 20 mL/hr at 08/20/12 1842   PRN Meds:.acetaminophen, albuterol, ALPRAZolam, benzonatate, morphine injection, ondansetron (ZOFRAN) IV, oxyCODONE-acetaminophen, sodium chloride, sorbitol, traZODone  Assessment/Plan:  Principal Problem:  *COPD with exacerbation Active Problems:  DM type 2 (diabetes mellitus, type 2)  HTN (hypertension)  BPH (benign prostatic hyperplasia)  Depression  Anxiety  Tobacco abuse  Anemia  Chronic respiratory failure with hypoxia  Physical deconditioning  Atrial fibrillation  Acute on Chronic hypoxic respiratory failure/Oxygen-dependent COPD with exacerbation. At baseline, he is chronically short of breath, so it is difficult to distinguish what is his baseline. He continues to smoke. He was strongly advised to stop smoking completely. He requested a nicotine patch which was granted. We'll continue IV Levaquin, bronchodilators with Atrovent/albuterol nebulizer, Symbicort, and Solu-Medrol. His influenza PCR is negative. He is on maximum therapy for copd exacerbation.  Atrial fibrillation.  Patient was noted to be tachycardic and required transfer to the ICU for rate control.  He was started on a diltiazem infusion.  He has since converted to sinus rhythm and has maintained sinus on oral cardizem now. Cardiology is following, appreciate assistance. It is not felt that he is an appropriate candidate for anticoagulation due to compliance issues and poor follow up. Avoid beta blocker in setting of severe copd.   Elevated BNP.  Patient found to have elevated bnp at 5740 and chest xray shows pulmonary vascular congestion.  Received one dose of lasix on 08/19/12. EF is  normal on echo.  Elevated Troponin. Likely due to demand ischemia due to elevated heart rate.  Patient has not had any chest pain.  No wall motion abnormalities on echo.  Type 2 diabetes mellitus. His capillary blood glucose is reasonable. Continue metformin. I have already titrated to sliding scale NovoLog up to the resistive scale.Will adjust accordingly.   Anemia. Etiology unknown. Anemia panel is relatively normal, but his vitamin B12 level is borderline low. We'll give him a couple doses of IM vitamin B12. Protonix started empirically.   Hypertension. Apparently he takes no chronic medications for this. Currently controlled.   Depression/anxiety. This appears to be stable. He appears to take no pharmacological medications for this.   Chronic physical deconditioning. PT evaluation noted and appreciated.   Disposition. The patient initially desired assisted living placement or skilled nursing facility placement short-term if he qualified however, he changed his mind and wants to go home. Physical therapy recommended no specific PT followup. The patient may benefit from a registered nurse followup at home or home health aide if he qualifies. This will be discussed with the case manager. Continue current treatments.  He will likely need a few more days in the hospital   Time spent coordinating care:   LOS: 5 days  MEMON,JEHANZEB [facility] Pager: [phone] 08/21/2012, 11:05 AM

## 2012-08-22 LAB — BASIC METABOLIC PANEL
BUN: 34 mg/dL — ABNORMAL HIGH (ref 6–23)
CO2: 24 mEq/L (ref 19–32)
Chloride: 102 mEq/L (ref 96–112)
Creatinine, Ser: 1.06 mg/dL (ref 0.50–1.35)

## 2012-08-22 LAB — GLUCOSE, CAPILLARY
Glucose-Capillary: 129 mg/dL — ABNORMAL HIGH (ref 70–99)
Glucose-Capillary: 123 mg/dL — ABNORMAL HIGH (ref 70–99)

## 2012-08-22 LAB — CBC
HCT: 29.9 % — ABNORMAL LOW (ref 39.0–52.0)
MCV: 91.4 fL (ref 78.0–100.0)
RBC: 3.27 MIL/uL — ABNORMAL LOW (ref 4.22–5.81)
WBC: 12.7 10*3/uL — ABNORMAL HIGH (ref 4.0–10.5)

## 2012-08-22 MED ORDER — FUROSEMIDE 10 MG/ML IJ SOLN
20.0000 mg | Freq: Once | INTRAMUSCULAR | Status: AC
  Administered 2012-08-22: 20 mg via INTRAVENOUS
  Filled 2012-08-22: qty 2

## 2012-08-22 NOTE — Progress Notes (Signed)
[facility]             Progress Note   Subjective: Still short of breath and wheezing, coughing.  Objective: Vital signs in last 24 hours: Temp:  [97.5 F (36.4 C)-98.3 F (36.8 C)] 98.3 F (36.8 C) (01/19 1349) Pulse Rate:  [80-98] 87  (01/19 1349) Resp:  [18-20] 20  (01/19 1349) BP: (121-144)/(55-68) 122/55 mmHg (01/19 1349) SpO2:  [92 %-99 %] 97 % (01/19 1349) Weight change:  Last BM Date: 08/21/12  Intake/Output from previous day: 01/18 0701 - 01/19 0700 In: 1200 [P.O.:840; I.V.:260; IV Piggyback:100] Out: 800 [Urine:800] Total I/O In: 801.7 [P.O.:480; I.V.:321.7] Out: 450 [Urine:450]   Physical Exam: General: Alert, awake, oriented x3, no signs of distress HEENT: No bruits, no goiter. Heart: Regular rate and rhythm, without murmurs, rubs, gallops. Lungs: b/l exp wheezes, improving. Abdomen: Soft, nontender, nondistended, positive bowel sounds. Extremities: No clubbing cyanosis or edema with positive pedal pulses. Neuro: Grossly intact, nonfocal.    Lab Results: Basic Metabolic Panel:  Basename 08/22/12 0435 08/20/12 0452  NA 138 138  K 3.9 3.5  CL 102 105  CO2 24 22  GLUCOSE 148* 136*  BUN 34* 30*  CREATININE 1.06 1.07  CALCIUM 8.8 8.9  MG -- --  PHOS -- --   Liver Function Tests: No results found for this basename: AST:2,ALT:2,ALKPHOS:2,BILITOT:2,PROT:2,ALBUMIN:2 in the last 72 hours No results found for this basename: LIPASE:2,AMYLASE:2 in the last 72 hours No results found for this basename: AMMONIA:2 in the last 72 hours CBC:  Basename 08/22/12 0435 08/20/12 0452  WBC 12.7* 8.1  NEUTROABS -- --  HGB 10.0* 9.2*  HCT 29.9* 27.6*  MCV 91.4 92.3  PLT 196 171   Cardiac Enzymes: No results found for this basename: CKTOTAL:3,CKMB:3,CKMBINDEX:3,TROPONINI:3 in the last 72 hours BNP: No results found for this basename: PROBNP:3 in the last 72 hours D-Dimer: No results found for this basename: DDIMER:2 in the last 72  hours CBG:  Basename 08/22/12 1131 08/22/12 0719 08/21/12 2108 08/21/12 1625 08/21/12 1140 08/21/12 0821  GLUCAP 129* 148* 133* 130* 169* 167*   Hemoglobin A1C: No results found for this basename: HGBA1C in the last 72 hours Fasting Lipid Panel: No results found for this basename: CHOL,HDL,LDLCALC,TRIG,CHOLHDL,LDLDIRECT in the last 72 hours Thyroid Function Tests: No results found for this basename: TSH,T4TOTAL,FREET4,T3FREE,THYROIDAB in the last 72 hours Anemia Panel: No results found for this basename: VITAMINB12,FOLATE,FERRITIN,TIBC,IRON,RETICCTPCT in the last 72 hours Coagulation: No results found for this basename: LABPROT:2,INR:2 in the last 72 hours Urine Drug Screen: Drugs of Abuse     Component Value Date/Time   LABOPIA NONE DETECTED 06/15/2012 1715   COCAINSCRNUR NONE DETECTED 06/15/2012 1715   LABBENZ NONE DETECTED 06/15/2012 1715   AMPHETMU NONE DETECTED 06/15/2012 1715   THCU NONE DETECTED 06/15/2012 1715   LABBARB NONE DETECTED 06/15/2012 1715    Alcohol Level: No results found for this basename: ETH:2 in the last 72 hours Urinalysis: No results found for this basename: COLORURINE:2,APPERANCEUR:2,LABSPEC:2,PHURINE:2,GLUCOSEU:2,HGBUR:2,BILIRUBINUR:2,KETONESUR:2,PROTEINUR:2,UROBILINOGEN:2,NITRITE:2,LEUKOCYTESUR:2 in the last 72 hours  Recent Results (from the past 240 hour(s))  MRSA PCR SCREENING     Status: Normal   Collection Time   08/18/12  4:30 PM      Component Value Range Status Comment   MRSA by PCR NEGATIVE  NEGATIVE Final     Studies/Results: No results found.  Medications: Scheduled Meds:    . budesonide-formoterol  2 puff Inhalation BID  . diltiazem  60 mg Oral Q6H  . enoxaparin (LOVENOX) injection  40 mg Subcutaneous Q24H  . guaiFENesin  1,200 mg Oral BID  . insulin aspart  0-20 Units Subcutaneous TID WC  . insulin aspart  0-5 Units Subcutaneous QHS  . ipratropium  0.5 mg Nebulization Q6H  . levalbuterol  0.63 mg Nebulization Q6H  .  levofloxacin (LEVAQUIN) IV  500 mg Intravenous Q24H  . metFORMIN  1,000 mg Oral BID WC  . methylPREDNISolone (SOLU-MEDROL) injection  60 mg Intravenous Q6H  . nicotine  7 mg Transdermal Daily  . pantoprazole  40 mg Oral Daily  . Tamsulosin HCl  0.4 mg Oral QPC supper   Continuous Infusions:    . 0.9 % NaCl with KCl 20 mEq / L 20 mL/hr at 08/20/12 1842   PRN Meds:.acetaminophen, albuterol, ALPRAZolam, benzonatate, morphine injection, ondansetron (ZOFRAN) IV, oxyCODONE-acetaminophen, sodium chloride, sorbitol, traZODone  Assessment/Plan:  Principal Problem:  *COPD with exacerbation Active Problems:  DM type 2 (diabetes mellitus, type 2)  HTN (hypertension)  BPH (benign prostatic hyperplasia)  Depression  Anxiety  Tobacco abuse  Anemia  Chronic respiratory failure with hypoxia  Physical deconditioning  Atrial fibrillation  Acute on Chronic hypoxic respiratory failure/Oxygen-dependent COPD with exacerbation. At baseline, he is chronically short of breath, so it is difficult to distinguish what is his baseline. He continues to smoke. He was strongly advised to stop smoking completely. He requested a nicotine patch which was granted. We'll continue IV Levaquin, bronchodilators with Atrovent/albuterol nebulizer, Symbicort, and Solu-Medrol. His influenza PCR is negative. He is on maximum therapy for copd exacerbation. He is slow to progress  Atrial fibrillation.  Patient was noted to be tachycardic and required transfer to the ICU for rate control.  He was started on a diltiazem infusion.  He has since converted to sinus rhythm and has maintained sinus on oral cardizem now. Cardiology is following, appreciate assistance. It is not felt that he is an appropriate candidate for anticoagulation due to compliance issues and poor follow up. Avoid beta blocker in setting of severe copd.   Elevated BNP.  Patient found to have elevated bnp at 5740 and chest xray shows pulmonary vascular congestion.   Received one dose of lasix on 08/19/12. EF is normal on echo.  Elevated Troponin. Likely due to demand ischemia due to elevated heart rate.  Patient has not had any chest pain.  No wall motion abnormalities on echo.  Type 2 diabetes mellitus. His capillary blood glucose is reasonable. Continue metformin. I have already titrated to sliding scale NovoLog up to the resistive scale.Will adjust accordingly.   Anemia. Etiology unknown. Anemia panel is relatively normal, but his vitamin B12 level is borderline low. We'll give him a couple doses of IM vitamin B12. Protonix started empirically.   Hypertension. Apparently he takes no chronic medications for this. Currently controlled.   Depression/anxiety. This appears to be stable. He appears to take no pharmacological medications for this.   Chronic physical deconditioning. PT evaluation noted and appreciated.   Disposition. The patient initially desired assisted living placement or skilled nursing facility placement short-term if he qualified however, he changed his mind and wants to go home. Physical therapy recommended no specific PT followup. The patient may benefit from a registered nurse followup at home or home health aide if he qualifies. This will be discussed with the case manager. Continue current treatments.  He will likely need a few more days in the hospital   Time spent coordinating care:   LOS: 6 days   MEMON,JEHANZEB [facility] Pager: [phone]  08/22/2012, 2:45 PM

## 2012-08-22 NOTE — Progress Notes (Signed)
Notified Dr. Kerry Hough of pts edema in rt arm which is new since yesterday. Computer order for Lasix. Sheryn Bison

## 2012-08-23 ENCOUNTER — Inpatient Hospital Stay (HOSPITAL_COMMUNITY): Payer: Medicare Other

## 2012-08-23 LAB — GLUCOSE, CAPILLARY
Glucose-Capillary: 161 mg/dL — ABNORMAL HIGH (ref 70–99)
Glucose-Capillary: 120 mg/dL — ABNORMAL HIGH (ref 70–99)

## 2012-08-23 LAB — CREATININE, SERUM: GFR calc non Af Amer: 65 mL/min — ABNORMAL LOW (ref >90)

## 2012-08-23 MED ORDER — ALUM & MAG HYDROXIDE-SIMETH 200-200-20 MG/5ML PO SUSP
30.0000 mL | ORAL | Status: DC | PRN
  Administered 2012-08-23 – 2012-08-24 (×2): 30 mL via ORAL
  Filled 2012-08-23 (×2): qty 30

## 2012-08-23 MED ORDER — DILTIAZEM HCL ER COATED BEADS 240 MG PO CP24
240.0000 mg | ORAL_CAPSULE | Freq: Every day | ORAL | Status: DC
  Administered 2012-08-23 – 2012-08-24 (×2): 240 mg via ORAL
  Filled 2012-08-23 (×2): qty 1

## 2012-08-23 NOTE — Progress Notes (Signed)
Pt. Up to walk in halls with nurse and tech, oxygen at 2 liters via nasal cannula, and pt. Using walker. Pt. Tolerated well.

## 2012-08-23 NOTE — Progress Notes (Signed)
Nutrition Brief Note  Chart reviewed due to LOS (day 7).  Body mass index is 27.75 kg/(m^2). Patient meets criteria for overweight based on current BMI.   Current diet order is carb modified, dysphagia 2, patient is consuming approximately 100% of meals at this time. Labs and medications reviewed.   No nutrition interventions warranted at this time. If nutrition issues arise, please consult RD.   Melody Haver, RD, LDN Pager: [phone]

## 2012-08-23 NOTE — Progress Notes (Addendum)
[facility]             Progress Note   Subjective: Feels that he is improving very slowly, still gets short of breath while speaking.  Cannot complete a sentence.  Objective: Vital signs in last 24 hours: Temp:  [97.2 F (36.2 C)-97.5 F (36.4 C)] 97.5 F (36.4 C) (01/20 0621) Pulse Rate:  [78-98] 78  (01/20 0621) Resp:  [20] 20  (01/20 0621) BP: (126-160)/(59-68) 160/65 mmHg (01/20 1138) SpO2:  [93 %-98 %] 94 % (01/20 1432) Weight change:  Last BM Date: 08/21/12  Intake/Output from previous day: 01/19 0701 - 01/20 0700 In: 1281.7 [P.O.:960; I.V.:321.7] Out: 1850 [Urine:1850] Total I/O In: 360 [P.O.:360] Out: 650 [Urine:650]   Physical Exam: General: Alert, awake, oriented x3, no signs of distress HEENT: No bruits, no goiter. Heart: Regular rate and rhythm, without murmurs, rubs, gallops. Lungs: b/l exp wheezes and rhonchi Abdomen: Soft, nontender, nondistended, positive bowel sounds. Extremities: right upper extremity is edematous, +pedal edema Neuro: Grossly intact, nonfocal.    Lab Results: Basic Metabolic Panel:  Basename 08/23/12 0416 08/22/12 0435  NA -- 138  K -- 3.9  CL -- 102  CO2 -- 24  GLUCOSE -- 148*  BUN -- 34*  CREATININE 1.12 1.06  CALCIUM -- 8.8  MG -- --  PHOS -- --   Liver Function Tests: No results found for this basename: AST:2,ALT:2,ALKPHOS:2,BILITOT:2,PROT:2,ALBUMIN:2 in the last 72 hours No results found for this basename: LIPASE:2,AMYLASE:2 in the last 72 hours No results found for this basename: AMMONIA:2 in the last 72 hours CBC:  Basename 08/22/12 0435  WBC 12.7*  NEUTROABS --  HGB 10.0*  HCT 29.9*  MCV 91.4  PLT 196   Cardiac Enzymes: No results found for this basename: CKTOTAL:3,CKMB:3,CKMBINDEX:3,TROPONINI:3 in the last 72 hours BNP: No results found for this basename: PROBNP:3 in the last 72 hours D-Dimer: No results found for this basename: DDIMER:2 in the last 72 hours CBG:  Basename 08/23/12  1211 08/23/12 0803 08/22/12 2029 08/22/12 1643 08/22/12 1131 08/22/12 0719  GLUCAP 144* 161* 147* 123* 129* 148*   Hemoglobin A1C: No results found for this basename: HGBA1C in the last 72 hours Fasting Lipid Panel: No results found for this basename: CHOL,HDL,LDLCALC,TRIG,CHOLHDL,LDLDIRECT in the last 72 hours Thyroid Function Tests: No results found for this basename: TSH,T4TOTAL,FREET4,T3FREE,THYROIDAB in the last 72 hours Anemia Panel: No results found for this basename: VITAMINB12,FOLATE,FERRITIN,TIBC,IRON,RETICCTPCT in the last 72 hours Coagulation: No results found for this basename: LABPROT:2,INR:2 in the last 72 hours Urine Drug Screen: Drugs of Abuse     Component Value Date/Time   LABOPIA NONE DETECTED 06/15/2012 1715   COCAINSCRNUR NONE DETECTED 06/15/2012 1715   LABBENZ NONE DETECTED 06/15/2012 1715   AMPHETMU NONE DETECTED 06/15/2012 1715   THCU NONE DETECTED 06/15/2012 1715   LABBARB NONE DETECTED 06/15/2012 1715    Alcohol Level: No results found for this basename: ETH:2 in the last 72 hours Urinalysis: No results found for this basename: COLORURINE:2,APPERANCEUR:2,LABSPEC:2,PHURINE:2,GLUCOSEU:2,HGBUR:2,BILIRUBINUR:2,KETONESUR:2,PROTEINUR:2,UROBILINOGEN:2,NITRITE:2,LEUKOCYTESUR:2 in the last 72 hours  Recent Results (from the past 240 hour(s))  MRSA PCR SCREENING     Status: Normal   Collection Time   08/18/12  4:30 PM      Component Value Range Status Comment   MRSA by PCR NEGATIVE  NEGATIVE Final     Studies/Results: No results found.  Medications: Scheduled Meds:    . budesonide-formoterol  2 puff Inhalation BID  . diltiazem  60 mg Oral Q6H  . enoxaparin (LOVENOX) injection  40 mg Subcutaneous Q24H  . guaiFENesin  1,200 mg Oral BID  . insulin aspart  0-20 Units Subcutaneous TID WC  . insulin aspart  0-5 Units Subcutaneous QHS  . ipratropium  0.5 mg Nebulization Q6H  . levalbuterol  0.63 mg Nebulization Q6H  . levofloxacin (LEVAQUIN) IV  500 mg  Intravenous Q24H  . metFORMIN  1,000 mg Oral BID WC  . methylPREDNISolone (SOLU-MEDROL) injection  60 mg Intravenous Q6H  . nicotine  7 mg Transdermal Daily  . pantoprazole  40 mg Oral Daily  . Tamsulosin HCl  0.4 mg Oral QPC supper   Continuous Infusions:    . 0.9 % NaCl with KCl 20 mEq / L 1,000 mL (08/22/12 2140)   PRN Meds:.acetaminophen, albuterol, ALPRAZolam, benzonatate, morphine injection, ondansetron (ZOFRAN) IV, oxyCODONE-acetaminophen, sodium chloride, sorbitol, traZODone  Assessment/Plan:  Principal Problem:  *COPD with exacerbation Active Problems:  DM type 2 (diabetes mellitus, type 2)  HTN (hypertension)  BPH (benign prostatic hyperplasia)  Depression  Anxiety  Tobacco abuse  Anemia  Chronic respiratory failure with hypoxia  Physical deconditioning  Atrial fibrillation  Acute on Chronic hypoxic respiratory failure/Oxygen-dependent COPD with exacerbation. At baseline, he is chronically short of breath, so it is difficult to distinguish what is his baseline. He continues to smoke. He was strongly advised to stop smoking completely. He requested a nicotine patch which was granted. We'll continue IV Levaquin, bronchodilators with Atrovent/albuterol nebulizer, Symbicort, and Solu-Medrol. His influenza PCR is negative. He is on maximum therapy for copd exacerbation. He is very slow to progress  Atrial fibrillation.  Patient was noted to be tachycardic and required transfer to the ICU for rate control.  He was started on a diltiazem infusion.  He has since converted to sinus rhythm and has maintained sinus on oral cardizem now. Cardiology is following, appreciate assistance. It is not felt that he is an appropriate candidate for anticoagulation due to compliance issues and poor follow up. Avoid beta blocker in setting of severe copd.   Elevated BNP.  Patient found to have elevated bnp at 5740 and chest xray shows pulmonary vascular congestion.  Received one dose of lasix on  08/19/12. EF is normal on echo.  Elevated Troponin. Likely due to demand ischemia due to elevated heart rate.  Patient has not had any chest pain.  No wall motion abnormalities on echo.  Type 2 diabetes mellitus. His capillary blood glucose is reasonable. Continue metformin. I have already titrated to sliding scale NovoLog up to the resistive scale.Will adjust accordingly.   Anemia. Etiology unknown. Anemia panel is relatively normal, but his vitamin B12 level is borderline low. Patient received a couple doses of IM vitamin B12. Protonix started empirically.   Hypertension. Apparently he takes no chronic medications for this. Currently controlled.   Depression/anxiety. This appears to be stable. He appears to take no pharmacological medications for this.   Chronic physical deconditioning. PT evaluation noted and appreciated.   Right upper extremity edema.  Will check venous dopplers.  Disposition. The patient initially desired assisted living placement or skilled nursing facility placement short-term if he qualified however, he changed his mind and wants to go home. Physical therapy recommended no specific PT followup. The patient may benefit from a registered nurse followup at home or home health aide if he qualifies. This will be discussed with the case manager. Continue current treatments. I suspect that his respiratory status and functional capacity is poor at baseline.  Since he is becoming short of breath while  speaking, we will continue current treatments.  Anticipate that he will be in the hospital for another 2-3 days.  Time spent coordinating care:   LOS: 7 days   Orange Hilligoss [facility] Pager: [phone] 08/23/2012, 3:50 PM

## 2012-08-23 NOTE — Progress Notes (Signed)
Patient ID: [name], male   DOB: 1941-06-06, 72 y.o.   MRN: [id number] Chart reviewed. Change Diltiazem to XL 240mg /day. Follow up in [facility] on 1/31. Will sign off.

## 2012-08-24 LAB — BASIC METABOLIC PANEL
Chloride: 98 mEq/L (ref 96–112)
Creatinine, Ser: 1.08 mg/dL (ref 0.50–1.35)
GFR calc Af Amer: 78 mL/min — ABNORMAL LOW (ref >90)
GFR calc non Af Amer: 68 mL/min — ABNORMAL LOW (ref >90)

## 2012-08-24 LAB — TROPONIN I: Troponin I: <0.3 ng/mL (ref <0.30)

## 2012-08-24 LAB — CBC
MCV: 90.1 fL (ref 78.0–100.0)
Platelets: 177 10*3/uL (ref 150–400)
RDW: 15.4 % (ref 11.5–15.5)
WBC: 16.7 10*3/uL — ABNORMAL HIGH (ref 4.0–10.5)

## 2012-08-24 LAB — GLUCOSE, CAPILLARY

## 2012-08-24 MED ORDER — NICOTINE 7 MG/24HR TD PT24: 1.0000 | MEDICATED_PATCH | TRANSDERMAL | Status: DC

## 2012-08-24 MED ORDER — BENZONATATE 200 MG PO CAPS: 200.0000 mg | ORAL_CAPSULE | Freq: Three times a day (TID) | ORAL | Status: DC | PRN

## 2012-08-24 MED ORDER — SENNOSIDES-DOCUSATE SODIUM 8.6-50 MG PO TABS: 1.0000 | ORAL_TABLET | Freq: Every evening | ORAL | Status: DC | PRN

## 2012-08-24 MED ORDER — SENNOSIDES-DOCUSATE SODIUM 8.6-50 MG PO TABS
3.0000 | ORAL_TABLET | Freq: Once | ORAL | Status: AC
  Administered 2012-08-24: 3 via ORAL
  Filled 2012-08-24: qty 3

## 2012-08-24 MED ORDER — ALUM & MAG HYDROXIDE-SIMETH 200-200-20 MG/5ML PO SUSP: 30.0000 mL | ORAL | Status: DC | PRN

## 2012-08-24 MED ORDER — TRAZODONE 25 MG HALF TABLET: 25.0000 mg | ORAL_TABLET | Freq: Every evening | ORAL | Status: DC | PRN

## 2012-08-24 MED ORDER — TAMSULOSIN HCL 0.4 MG PO CAPS: 0.4000 mg | ORAL_CAPSULE | Freq: Every day | ORAL | Status: AC

## 2012-08-24 MED ORDER — GUAIFENESIN ER 600 MG PO TB12: 1200.0000 mg | ORAL_TABLET | Freq: Two times a day (BID) | ORAL | Status: DC

## 2012-08-24 MED ORDER — PANTOPRAZOLE SODIUM 40 MG IV SOLR
40.0000 mg | Freq: Every day | INTRAVENOUS | Status: DC
  Administered 2012-08-24: 40 mg via INTRAVENOUS
  Filled 2012-08-24: qty 40

## 2012-08-24 MED ORDER — ALPRAZOLAM 0.25 MG PO TABS: 0.2500 mg | ORAL_TABLET | Freq: Three times a day (TID) | ORAL | Status: DC | PRN

## 2012-08-24 MED ORDER — SORBITOL 70 % SOLN: 30.0000 mL | Freq: Every day | Status: DC | PRN

## 2012-08-24 MED ORDER — DILTIAZEM HCL ER COATED BEADS 240 MG PO CP24: 240.0000 mg | ORAL_CAPSULE | Freq: Every day | ORAL | Status: DC

## 2012-08-24 NOTE — Progress Notes (Signed)
Dr Phillips Odor notified of EKG results.

## 2012-08-24 NOTE — Progress Notes (Signed)
Patient complains of persistent "indigestion", unresponsive to prn antacids. Given his slightly elevated cardiac markers on admission will check a 12 lead and stat troponin. IV PPI ordered.

## 2012-08-24 NOTE — Progress Notes (Signed)
Discharge instructions given to pt. With teach back given to RN. Pt. Placed in W/C and taken to car.

## 2012-08-24 NOTE — Progress Notes (Signed)
Dr. Mahala Menghini notified of [place] results done on 1/20/ 14 of right upper extremity.

## 2012-08-24 NOTE — Discharge Summary (Signed)
Physician Discharge Summary  KEYMARI SATO WUJ:[id number] DOB: 07-13-1941 DOA: 08/16/2012  PCP: No primary provider on file.  Admit date: 08/16/2012 Discharge date: 08/24/2012  Time spent: 25 minutes  Recommendations for Outpatient Follow-up:  1. Smoking cessation should be followed up as an outpatient 2. Patient will continue his regular home oxygen 3. Needs close outpatient followup with cardiology which has already been set up. 4. Patient is not a good candidate for anticoagulation despite A. fibrillation per cardiologist  Discharge Diagnoses:  Principal Problem:  *COPD with exacerbation Active Problems:  DM type 2 (diabetes mellitus, type 2)  HTN (hypertension)  BPH (benign prostatic hyperplasia)  Depression  Anxiety  Tobacco abuse  Anemia  Chronic respiratory failure with hypoxia  Physical deconditioning  Atrial fibrillation   Discharge Condition: Stable but guarded and poor prognosis overall if he continues to smoke  Diet recommendation: Healthy low-salt  Filed Weights   08/20/12 0500 08/21/12 0500 08/24/12 9562  Weight: 71.2 kg (156 lb 15.5 oz) 72.2 kg (159 lb 2.8 oz) 77.701 kg (171 lb 4.8 oz)    History of present illness:  72 year old Caucasian male admitted 08/16/2012 with cough wheezing and was found to be short of breath despite multiple nebulizations in the emergency room. He was initially started on Tamiflu and IV Levaquin which she completed a course of on 08/23/2012. He was kept on regular floor but then transition to step down unit on 1/15 because A. Fibrillation/flutter-was started on a Cardizem drip but converted quickly from A. fib to normal sinus rhythm on the drip-he was given some Lasix and transitioned to by mouth Cardizem. Please see below for rest of hospital visit and  Hospital Course:  1. Acute on Chronic hypoxic respiratory failure/Oxygen-dependent COPD with exacerbation. At baseline, he is chronically short of breath, so it is difficult to  distinguish what is his baseline. He continues to smoke. He was strongly advised to stop smoking completely. He requested a nicotine patch which was granted. Patient complete a seven-day course of IV Levaquin. Steroids also were completed during hospital stay. He has poor function at baseline and will probably need close outpatient follow 2. Atrial fibrillation. Patient was noted to be tachycardic and required transfer to the ICU for rate control. He was started on a diltiazem infusion. He has since converted to sinus rhythm and has maintained sinus on oral cardizem 40 mg now. Cardiology is following, appreciate assistance. It is not felt that he is an appropriate candidate for anticoagulation due to compliance issues and poor follow up. Avoid beta blocker in setting of severe copd.  3. Elevated BNP. Patient found to have elevated bnp at 5740 and chest xray shows pulmonary vascular congestion. Received one dose of lasix on 08/19/12. EF is normal on echo.  4. Elevated Troponin. Likely due to demand ischemia due to elevated heart rate. Patient has not had any chest pain. No wall motion abnormalities on echo.  5. Type 2 diabetes mellitus. His capillary blood glucose is reasonable. Continue metformin as an outpatient. Will need further management and A1c and a 6. Anemia. Etiology unknown. Anemia panel is relatively normal, but his vitamin B12 level is borderline low. Patient received a couple doses of IM vitamin B12. Protonix started empirically.  7. Hypertension. Apparently he takes no chronic medications for this. Currently controlled.  8. Depression/anxiety. This appears to be stable. He appears to take no pharmacological medications for this.  9. Chronic physical deconditioning. PT evaluation noted and appreciated.  10. Right upper extremity edema.  Will check venous dopplers-done 1.21 and negative-showed only a thrombus which was in Cephalic vein    Consultations:  Heart etiology  Discharge  Exam: Filed Vitals:   08/24/12 0055 08/24/12 0140 08/24/12 0613 08/24/12 0710  BP:  164/68 164/73   Pulse: 92 96 105   Temp:  98.1 F (36.7 C) 98.6 F (37 C)   TempSrc:  Oral Oral   Resp:   20   Height:      Weight:   77.701 kg (171 lb 4.8 oz)   SpO2:   96% 96%   Alert pleasant sitting in bed. Complaining of chest pain, troponin and EKG done last night for indigestion showed no specific issue. States his shortness of breath is at chronic state and he is having abdominal pain and requests an enema.  General: Pleasant alert oriented Cardiovascular: S1-S2 no murmur rub or gallop sinus rhythm Respiratory: Clinically clear  Discharge Instructions  Discharge Orders    Future Appointments: Provider: Department: Dept Phone: Center:   09/03/2012 1:00 PM Jodelle Gross, NP [facility] [place] [phone] [id number]     Future Orders Please Complete By Expires   Diet - low sodium heart healthy      Increase activity slowly          Medication List     As of 08/24/2012 10:20 AM    STOP taking these medications         ALTAMIST SPRAY 0.65 % nasal spray   Generic drug: sodium chloride      TAKE these medications         albuterol (2.5 MG/3ML) 0.083% nebulizer solution   Commonly known as: PROVENTIL   Take 2.5 mg by nebulization 2 (two) times daily.      PROVENTIL HFA 108 (90 BASE) MCG/ACT inhaler   Generic drug: albuterol   Inhale 2 puffs into the lungs 4 (four) times daily as needed. For shortness of breath/ COPD      ALPRAZolam 0.25 MG tablet   Commonly known as: XANAX   Take 1 tablet (0.25 mg total) by mouth 3 (three) times daily as needed for anxiety (air hunger, dyspnea).      alum & mag hydroxide-simeth 200-200-20 MG/5ML suspension   Commonly known as: MAALOX/MYLANTA   Take 30 mLs by mouth as needed.      benzonatate 200 MG capsule   Commonly known as: TESSALON   Take 1 capsule (200 mg total) by mouth 3 (three) times daily as needed for cough.       budesonide-formoterol 160-4.5 MCG/ACT inhaler   Commonly known as: SYMBICORT   Inhale 2 puffs into the lungs 2 (two) times daily.      diltiazem 240 MG 24 hr capsule   Commonly known as: CARDIZEM CD   Take 1 capsule (240 mg total) by mouth daily.      guaiFENesin 600 MG 12 hr tablet   Commonly known as: MUCINEX   Take 2 tablets (1,200 mg total) by mouth 2 (two) times daily.      ipratropium 0.02 % nebulizer solution   Commonly known as: ATROVENT   Take 500 mcg by nebulization 2 (two) times daily as needed. For chest congestion      metFORMIN 1000 MG tablet   Commonly known as: GLUCOPHAGE   Take 1,000 mg by mouth 2 (two) times daily.      nicotine 7 mg/24hr patch   Commonly known as: NICODERM CQ - dosed in mg/24 hr  Place 1 patch onto the skin daily.      sorbitol 70 % Soln   Take 30 mLs by mouth daily as needed.      Tamsulosin HCl 0.4 MG Caps   Commonly known as: FLOMAX   Take 1 capsule (0.4 mg total) by mouth daily after supper.      tiotropium 18 MCG inhalation capsule   Commonly known as: SPIRIVA   Place 18 mcg into inhaler and inhale daily.      traZODone 25 mg Tabs   Commonly known as: DESYREL   Take 0.5 tablets (25 mg total) by mouth at bedtime as needed for sleep (insomnia).           Follow-up Information    Follow up with Joni Reining, NP. (1 pm)    Contact information:   [address] [place] [address] [phone]           The results of significant diagnostics from this hospitalization (including imaging, microbiology, ancillary and laboratory) are listed below for reference.    Significant Diagnostic Studies: Dg Chest Port 1 View  08/18/2012  *RADIOLOGY REPORT*  Clinical Data: Chest pain.  COPD.  PORTABLE CHEST - 1 VIEW  Comparison: One-view chest to 08/16/2012.  Findings: The heart size is normal.  There is slight increase in pulmonary vascular congestion since the prior exam.  Minimal bibasilar atelectasis is evident.  The  upper lung fields are clear. Emphysematous changes are again noted.  IMPRESSION:  1.  Slight increase in pulmonary vascular congestion and mild bibasilar atelectasis. 2.  Emphysema.   Original Report Authenticated By: Marin Roberts, M.D.    Dg Chest Port 1 View  08/16/2012  *RADIOLOGY REPORT*  Clinical Data: Chest pain, shortness of breath.  PORTABLE CHEST - 1 VIEW  Comparison: 06/15/2012  Findings: Somewhat prominent perihilar and bibasilar interstitial markings as before.  No confluent airspace infiltrate or overt edema.  No effusion.  Atheromatous aorta.  Heart size normal.  IMPRESSION:  1.  Stable chest   Original Report Authenticated By: D. Andria Rhein, MD     Microbiology: Recent Results (from the past 240 hour(s))  MRSA PCR SCREENING     Status: Normal   Collection Time   08/18/12  4:30 PM      Component Value Range Status Comment   MRSA by PCR NEGATIVE  NEGATIVE Final      Labs: Basic Metabolic Panel:  Lab 08/24/12 8469 08/23/12 0416 08/22/12 0435 08/20/12 0452  NA 136 -- 138 138  K 3.8 -- 3.9 3.5  CL 98 -- 102 105  CO2 22 -- 24 22  GLUCOSE 137* -- 148* 136*  BUN 39* -- 34* 30*  CREATININE 1.08 1.12 1.06 1.07  CALCIUM 8.7 -- 8.8 8.9  MG -- -- -- --  PHOS -- -- -- --   Liver Function Tests: No results found for this basename: AST:5,ALT:5,ALKPHOS:5,BILITOT:5,PROT:5,ALBUMIN:5 in the last 168 hours No results found for this basename: LIPASE:5,AMYLASE:5 in the last 168 hours No results found for this basename: AMMONIA:5 in the last 168 hours CBC:  Lab 08/24/12 0238 08/22/12 0435 08/20/12 0452  WBC 16.7* 12.7* 8.1  NEUTROABS -- -- --  HGB 10.8* 10.0* 9.2*  HCT 31.1* 29.9* 27.6*  MCV 90.1 91.4 92.3  PLT 177 196 171   Cardiac Enzymes:  Lab 08/24/12 0237 08/19/12 0423 08/18/12 2211 08/18/12 1701 08/18/12 1523  CKTOTAL -- -- -- -- --  CKMB -- -- -- -- --  CKMBINDEX -- -- -- -- --  TROPONINI <0.30 0.33* <0.30 <0.30 <0.30   BNP: BNP (last 3 results)  Basename  08/19/12 0423  PROBNP 5740.0*   CBG:  Lab 08/24/12 0742 08/23/12 2119 08/23/12 1707 08/23/12 1211 08/23/12 0803  GLUCAP 134* 140* 120* 144* 161*       Signed:  Rhetta Mura  [facility] 08/24/2012, 10:20 AM

## 2012-08-25 ENCOUNTER — Telehealth: Payer: Self-pay | Admitting: *Deleted

## 2012-08-25 MED ORDER — FUROSEMIDE 20 MG PO TABS: 20.0000 mg | ORAL_TABLET | Freq: Every day | ORAL | Status: DC

## 2012-08-25 NOTE — Telephone Encounter (Signed)
Cheryl at [facility] notified of recommendations.  Chart updated to reflect change.

## 2012-08-25 NOTE — Telephone Encounter (Signed)
I saw patient in consultation, however was not involved in the remainder of his hospital stay. I see that his LV function was normal by echocardiogram. He could have volume overload related to COPD and RV strain cor pulmonale, also complicated by atrial fibrillation. Could try Lasix starting at 20 mg daily, although I suspect that his shortness of breath is multifactorial, and principally pulmonary in etiology. He really should follow up with his primary care physician sooner.

## 2012-08-25 NOTE — Telephone Encounter (Signed)
Received TFC from [name] at [facility], stating that he is not feeling any better since discharge.  Reports 3+ pitting pedal edema, rhonchi and wheezing, O2 96 % on 2 liters, R 24 and labored, BP 160/72.  Was not discharged on a diuretic and does not have a follow up appointment until 1/31 with [name].  Please advise

## 2012-08-29 ENCOUNTER — Encounter (HOSPITAL_COMMUNITY): Payer: Self-pay | Admitting: Emergency Medicine

## 2012-08-29 ENCOUNTER — Emergency Department (HOSPITAL_COMMUNITY): Payer: Medicare Other

## 2012-08-29 ENCOUNTER — Inpatient Hospital Stay (HOSPITAL_COMMUNITY)
Admission: EM | Admit: 2012-08-29 | Discharge: 2012-09-04 | DRG: 189 | Disposition: A | Discharge status: Home Health | Payer: Medicare Other | Attending: Internal Medicine | Admitting: Internal Medicine

## 2012-08-29 DIAGNOSIS — R45851 Suicidal ideations: Secondary | ICD-10-CM

## 2012-08-29 DIAGNOSIS — J9611 Chronic respiratory failure with hypoxia: Secondary | ICD-10-CM

## 2012-08-29 DIAGNOSIS — K224 Dyskinesia of esophagus: Secondary | ICD-10-CM | POA: Diagnosis present

## 2012-08-29 DIAGNOSIS — F3289 Other specified depressive episodes: Secondary | ICD-10-CM | POA: Diagnosis present

## 2012-08-29 DIAGNOSIS — F1320 Sedative, hypnotic or anxiolytic dependence, uncomplicated: Secondary | ICD-10-CM | POA: Diagnosis present

## 2012-08-29 DIAGNOSIS — I10 Essential (primary) hypertension: Secondary | ICD-10-CM | POA: Diagnosis present

## 2012-08-29 DIAGNOSIS — G894 Chronic pain syndrome: Secondary | ICD-10-CM | POA: Diagnosis present

## 2012-08-29 DIAGNOSIS — D649 Anemia, unspecified: Secondary | ICD-10-CM | POA: Diagnosis present

## 2012-08-29 DIAGNOSIS — J449 Chronic obstructive pulmonary disease, unspecified: Secondary | ICD-10-CM | POA: Diagnosis present

## 2012-08-29 DIAGNOSIS — D696 Thrombocytopenia, unspecified: Secondary | ICD-10-CM | POA: Diagnosis present

## 2012-08-29 DIAGNOSIS — I252 Old myocardial infarction: Secondary | ICD-10-CM

## 2012-08-29 DIAGNOSIS — E871 Hypo-osmolality and hyponatremia: Secondary | ICD-10-CM

## 2012-08-29 DIAGNOSIS — D638 Anemia in other chronic diseases classified elsewhere: Secondary | ICD-10-CM | POA: Diagnosis present

## 2012-08-29 DIAGNOSIS — F419 Anxiety disorder, unspecified: Secondary | ICD-10-CM

## 2012-08-29 DIAGNOSIS — J441 Chronic obstructive pulmonary disease with (acute) exacerbation: Secondary | ICD-10-CM | POA: Diagnosis present

## 2012-08-29 DIAGNOSIS — Z720 Tobacco use: Secondary | ICD-10-CM

## 2012-08-29 DIAGNOSIS — R112 Nausea with vomiting, unspecified: Secondary | ICD-10-CM | POA: Diagnosis not present

## 2012-08-29 DIAGNOSIS — Z79899 Other long term (current) drug therapy: Secondary | ICD-10-CM

## 2012-08-29 DIAGNOSIS — I4891 Unspecified atrial fibrillation: Secondary | ICD-10-CM | POA: Diagnosis present

## 2012-08-29 DIAGNOSIS — I480 Paroxysmal atrial fibrillation: Secondary | ICD-10-CM

## 2012-08-29 DIAGNOSIS — M1990 Unspecified osteoarthritis, unspecified site: Secondary | ICD-10-CM | POA: Diagnosis present

## 2012-08-29 DIAGNOSIS — G609 Hereditary and idiopathic neuropathy, unspecified: Secondary | ICD-10-CM | POA: Diagnosis present

## 2012-08-29 DIAGNOSIS — E876 Hypokalemia: Secondary | ICD-10-CM | POA: Diagnosis present

## 2012-08-29 DIAGNOSIS — E119 Type 2 diabetes mellitus without complications: Secondary | ICD-10-CM | POA: Diagnosis present

## 2012-08-29 DIAGNOSIS — F17200 Nicotine dependence, unspecified, uncomplicated: Secondary | ICD-10-CM | POA: Diagnosis present

## 2012-08-29 DIAGNOSIS — K56609 Unspecified intestinal obstruction, unspecified as to partial versus complete obstruction: Secondary | ICD-10-CM | POA: Diagnosis present

## 2012-08-29 DIAGNOSIS — I279 Pulmonary heart disease, unspecified: Secondary | ICD-10-CM | POA: Diagnosis present

## 2012-08-29 DIAGNOSIS — F1010 Alcohol abuse, uncomplicated: Secondary | ICD-10-CM | POA: Diagnosis present

## 2012-08-29 DIAGNOSIS — I2781 Cor pulmonale (chronic): Secondary | ICD-10-CM | POA: Diagnosis present

## 2012-08-29 DIAGNOSIS — J9620 Acute and chronic respiratory failure, unspecified whether with hypoxia or hypercapnia: Principal | ICD-10-CM | POA: Diagnosis present

## 2012-08-29 DIAGNOSIS — F329 Major depressive disorder, single episode, unspecified: Secondary | ICD-10-CM | POA: Diagnosis present

## 2012-08-29 DIAGNOSIS — F411 Generalized anxiety disorder: Secondary | ICD-10-CM | POA: Diagnosis present

## 2012-08-29 DIAGNOSIS — N400 Enlarged prostate without lower urinary tract symptoms: Secondary | ICD-10-CM

## 2012-08-29 DIAGNOSIS — R5381 Other malaise: Secondary | ICD-10-CM | POA: Diagnosis present

## 2012-08-29 DIAGNOSIS — F4310 Post-traumatic stress disorder, unspecified: Secondary | ICD-10-CM | POA: Diagnosis present

## 2012-08-29 DIAGNOSIS — K56600 Partial intestinal obstruction, unspecified as to cause: Secondary | ICD-10-CM | POA: Diagnosis not present

## 2012-08-29 DIAGNOSIS — R600 Localized edema: Secondary | ICD-10-CM | POA: Diagnosis present

## 2012-08-29 HISTORY — DX: Paroxysmal atrial fibrillation: I48.0

## 2012-08-29 HISTORY — DX: Partial intestinal obstruction, unspecified as to cause: K56.600

## 2012-08-29 HISTORY — DX: Anxiety disorder, unspecified: F41.9

## 2012-08-29 HISTORY — DX: Localized edema: R60.0

## 2012-08-29 LAB — BASIC METABOLIC PANEL
BUN: 12 mg/dL (ref 6–23)
CO2: 32 mEq/L (ref 19–32)
Glucose, Bld: 121 mg/dL — ABNORMAL HIGH (ref 70–99)
Potassium: 3.5 mEq/L (ref 3.5–5.1)
Sodium: 138 mEq/L (ref 135–145)

## 2012-08-29 LAB — PRO B NATRIURETIC PEPTIDE: Pro B Natriuretic peptide (BNP): 555.5 pg/mL — ABNORMAL HIGH (ref 0–125)

## 2012-08-29 LAB — CBC WITH DIFFERENTIAL/PLATELET
Eosinophils Relative: 1 % (ref 0–5)
Hemoglobin: 9.8 g/dL — ABNORMAL LOW (ref 13.0–17.0)
Lymphocytes Relative: 9 % — ABNORMAL LOW (ref 12–46)
Lymphs Abs: 1.2 10*3/uL (ref 0.7–4.0)
MCV: 92.4 fL (ref 78.0–100.0)
Monocytes Relative: 5 % (ref 3–12)
Neutrophils Relative %: 84 % — ABNORMAL HIGH (ref 43–77)
Platelets: 112 10*3/uL — ABNORMAL LOW (ref 150–400)
RBC: 3.14 MIL/uL — ABNORMAL LOW (ref 4.22–5.81)
WBC: 13.6 10*3/uL — ABNORMAL HIGH (ref 4.0–10.5)

## 2012-08-29 LAB — GLUCOSE, CAPILLARY: Glucose-Capillary: 193 mg/dL — ABNORMAL HIGH (ref 70–99)

## 2012-08-29 MED ORDER — NICOTINE 7 MG/24HR TD PT24
7.0000 mg | MEDICATED_PATCH | TRANSDERMAL | Status: DC
  Administered 2012-08-29 – 2012-09-03 (×6): 7 mg via TRANSDERMAL
  Filled 2012-08-29 (×8): qty 1

## 2012-08-29 MED ORDER — BUDESONIDE-FORMOTEROL FUMARATE 160-4.5 MCG/ACT IN AERO
2.0000 | INHALATION_SPRAY | Freq: Two times a day (BID) | RESPIRATORY_TRACT | Status: DC
  Administered 2012-08-29 – 2012-09-04 (×12): 2 via RESPIRATORY_TRACT
  Filled 2012-08-29: qty 6

## 2012-08-29 MED ORDER — TRAZODONE HCL 50 MG PO TABS
25.0000 mg | ORAL_TABLET | Freq: Every evening | ORAL | Status: DC | PRN
  Administered 2012-08-29 – 2012-09-03 (×4): 25 mg via ORAL
  Filled 2012-08-29 (×4): qty 1

## 2012-08-29 MED ORDER — GUAIFENESIN ER 600 MG PO TB12
1200.0000 mg | ORAL_TABLET | Freq: Two times a day (BID) | ORAL | Status: DC
  Administered 2012-08-29 – 2012-09-01 (×6): 1200 mg via ORAL
  Filled 2012-08-29 (×7): qty 2

## 2012-08-29 MED ORDER — LEVALBUTEROL HCL 0.63 MG/3ML IN NEBU: 0.6300 mg | INHALATION_SOLUTION | Freq: Four times a day (QID) | RESPIRATORY_TRACT | Status: DC | PRN

## 2012-08-29 MED ORDER — ONDANSETRON HCL 4 MG PO TABS
4.0000 mg | ORAL_TABLET | Freq: Four times a day (QID) | ORAL | Status: DC | PRN
  Administered 2012-09-01: 4 mg via ORAL
  Filled 2012-08-29: qty 1

## 2012-08-29 MED ORDER — DILTIAZEM HCL ER COATED BEADS 240 MG PO CP24
240.0000 mg | ORAL_CAPSULE | Freq: Every day | ORAL | Status: DC
  Administered 2012-08-29 – 2012-09-01 (×4): 240 mg via ORAL
  Filled 2012-08-29 (×4): qty 1

## 2012-08-29 MED ORDER — LEVOFLOXACIN IN D5W 500 MG/100ML IV SOLN
INTRAVENOUS | Status: AC
  Filled 2012-08-29: qty 100

## 2012-08-29 MED ORDER — ALBUTEROL SULFATE (5 MG/ML) 0.5% IN NEBU
INHALATION_SOLUTION | RESPIRATORY_TRACT | Status: AC
  Filled 2012-08-29: qty 1

## 2012-08-29 MED ORDER — ALPRAZOLAM 0.25 MG PO TABS
0.2500 mg | ORAL_TABLET | Freq: Three times a day (TID) | ORAL | Status: DC | PRN
Start: 2012-08-29 — End: 2012-09-04
  Administered 2012-08-29 – 2012-09-04 (×8): 0.25 mg via ORAL
  Filled 2012-08-29 (×8): qty 1

## 2012-08-29 MED ORDER — BUDESONIDE-FORMOTEROL FUMARATE 160-4.5 MCG/ACT IN AERO
INHALATION_SPRAY | RESPIRATORY_TRACT | Status: AC
  Filled 2012-08-29: qty 6

## 2012-08-29 MED ORDER — ALUM & MAG HYDROXIDE-SIMETH 200-200-20 MG/5ML PO SUSP
30.0000 mL | ORAL | Status: DC | PRN
  Administered 2012-09-01: 30 mL via ORAL
  Filled 2012-08-29: qty 30

## 2012-08-29 MED ORDER — INSULIN ASPART 100 UNIT/ML ~~LOC~~ SOLN
0.0000 [IU] | Freq: Three times a day (TID) | SUBCUTANEOUS | Status: DC
  Administered 2012-08-29: 3 [IU] via SUBCUTANEOUS
  Administered 2012-08-30: 8 [IU] via SUBCUTANEOUS

## 2012-08-29 MED ORDER — ACETAMINOPHEN 325 MG PO TABS: 650.0000 mg | ORAL_TABLET | Freq: Four times a day (QID) | ORAL | Status: DC | PRN

## 2012-08-29 MED ORDER — IPRATROPIUM BROMIDE 0.02 % IN SOLN
0.5000 mg | Freq: Once | RESPIRATORY_TRACT | Status: AC
  Administered 2012-08-29: 0.5 mg via RESPIRATORY_TRACT
  Filled 2012-08-29: qty 2.5

## 2012-08-29 MED ORDER — ACETAMINOPHEN 650 MG RE SUPP: 650.0000 mg | Freq: Four times a day (QID) | RECTAL | Status: DC | PRN

## 2012-08-29 MED ORDER — LEVOFLOXACIN IN D5W 500 MG/100ML IV SOLN
500.0000 mg | INTRAVENOUS | Status: DC
  Administered 2012-08-29: 500 mg via INTRAVENOUS
  Filled 2012-08-29: qty 100

## 2012-08-29 MED ORDER — NICOTINE 7 MG/24HR TD PT24
MEDICATED_PATCH | TRANSDERMAL | Status: AC
  Filled 2012-08-29: qty 1

## 2012-08-29 MED ORDER — INSULIN ASPART 100 UNIT/ML ~~LOC~~ SOLN
0.0000 [IU] | Freq: Every day | SUBCUTANEOUS | Status: DC
  Administered 2012-08-29: 22:00:00 via SUBCUTANEOUS
  Administered 2012-08-31: 3 [IU] via SUBCUTANEOUS

## 2012-08-29 MED ORDER — ALBUTEROL SULFATE (5 MG/ML) 0.5% IN NEBU
5.0000 mg | INHALATION_SOLUTION | Freq: Once | RESPIRATORY_TRACT | Status: AC
  Administered 2012-08-29: 5 mg via RESPIRATORY_TRACT
  Filled 2012-08-29: qty 1

## 2012-08-29 MED ORDER — METHYLPREDNISOLONE SODIUM SUCC 125 MG IJ SOLR
125.0000 mg | Freq: Once | INTRAMUSCULAR | Status: AC
  Administered 2012-08-29: 125 mg via INTRAVENOUS
  Filled 2012-08-29: qty 2

## 2012-08-29 MED ORDER — ONDANSETRON HCL 4 MG/2ML IJ SOLN
4.0000 mg | Freq: Four times a day (QID) | INTRAMUSCULAR | Status: DC | PRN
  Administered 2012-09-01 – 2012-09-02 (×2): 4 mg via INTRAVENOUS
  Filled 2012-08-29 (×2): qty 2

## 2012-08-29 MED ORDER — METHYLPREDNISOLONE SODIUM SUCC 125 MG IJ SOLR
60.0000 mg | Freq: Four times a day (QID) | INTRAMUSCULAR | Status: DC
  Administered 2012-08-29 – 2012-08-30 (×3): 60 mg via INTRAVENOUS
  Filled 2012-08-29 (×2): qty 2

## 2012-08-29 MED ORDER — BENZONATATE 100 MG PO CAPS: 200.0000 mg | ORAL_CAPSULE | Freq: Three times a day (TID) | ORAL | Status: DC | PRN

## 2012-08-29 MED ORDER — IPRATROPIUM BROMIDE 0.02 % IN SOLN
0.5000 mg | Freq: Four times a day (QID) | RESPIRATORY_TRACT | Status: DC
  Administered 2012-08-29 – 2012-09-04 (×24): 0.5 mg via RESPIRATORY_TRACT
  Filled 2012-08-29 (×24): qty 2.5

## 2012-08-29 MED ORDER — TAMSULOSIN HCL 0.4 MG PO CAPS
0.4000 mg | ORAL_CAPSULE | Freq: Every day | ORAL | Status: DC
  Administered 2012-08-29 – 2012-09-01 (×4): 0.4 mg via ORAL
  Filled 2012-08-29 (×4): qty 1

## 2012-08-29 MED ORDER — FUROSEMIDE 10 MG/ML IJ SOLN
20.0000 mg | Freq: Every day | INTRAMUSCULAR | Status: DC
  Administered 2012-08-29 – 2012-08-30 (×2): 20 mg via INTRAVENOUS
  Filled 2012-08-29 (×2): qty 2

## 2012-08-29 MED ORDER — LEVALBUTEROL HCL 0.63 MG/3ML IN NEBU
0.6300 mg | INHALATION_SOLUTION | Freq: Four times a day (QID) | RESPIRATORY_TRACT | Status: DC
  Administered 2012-08-29 – 2012-09-04 (×24): 0.63 mg via RESPIRATORY_TRACT
  Filled 2012-08-29 (×26): qty 3

## 2012-08-29 MED ORDER — HYDROCODONE-ACETAMINOPHEN 5-325 MG PO TABS
1.0000 | ORAL_TABLET | ORAL | Status: DC | PRN
  Administered 2012-08-29 – 2012-09-01 (×10): 2 via ORAL
  Administered 2012-09-02 (×2): 1 via ORAL
  Administered 2012-09-03 – 2012-09-04 (×4): 2 via ORAL
  Filled 2012-08-29 (×2): qty 2
  Filled 2012-08-29: qty 1
  Filled 2012-08-29 (×7): qty 2
  Filled 2012-08-29: qty 1
  Filled 2012-08-29 (×5): qty 2

## 2012-08-29 MED ORDER — ENOXAPARIN SODIUM 40 MG/0.4ML ~~LOC~~ SOLN
40.0000 mg | SUBCUTANEOUS | Status: DC
  Administered 2012-08-29 – 2012-09-02 (×5): 40 mg via SUBCUTANEOUS
  Filled 2012-08-29 (×5): qty 0.4

## 2012-08-29 NOTE — ED Provider Notes (Addendum)
History  This chart was scribed for Celene Kras, MD by Marlin Canary ED Scribe. The patient was seen in room APA10/APA10. Patient's care was started at 1035.  CSN: [id number]  Arrival date & time 08/29/12  1009   First MD Initiated Contact with Patient 08/29/12 1035      Chief Complaint  Patient presents with  . Shortness of Breath   The history is provided by the patient. No language interpreter was used.    HPI Comments: [name] is a 72 y.o. male with a history of COPD, CHF and MI who presents to the Emergency Department complaining of persistent, moderate, gradually worsening shortness of breath onset 1-2 days ago. There is associated mild, intermittent, nonproductive cough and leg swelling. Patient denies fever. Patient is on 2L [place] at home at all times. He states that he has given himself nebulizer treatments at home with no relief. Patient was seen on 08/16/12 for severe shortness of breath and productive cough and was subsequently admitted to ICU for COPD exacerbation. Patient was discharged on 08/24/12, but he states now that he didn't feel like he was "ready to go." Patient is a current every day smoker.   Past Medical History  Diagnosis Date  . Essential hypertension, benign   . Alcohol abuse   . PTSD (post-traumatic stress disorder)   . Chronic back pain   . Depression   . Peripheral neuropathy   . DJD (degenerative joint disease)   . COPD (chronic obstructive pulmonary disease)   . Anemia   . Myocardial infarction   . Type II or unspecified type diabetes mellitus without mention of complication, not stated as uncontrolled   . Esophageal dysmotility 07/04/2011  . Chronic respiratory failure with hypoxia     Past Surgical History  Procedure Date  . Hernia repair     X 3  . Spinal surgeries     X 4  . Appendectomy   . Prostate surgery     Family History  Problem Relation Age of Onset  . Lung disease Mother   . Prostate cancer Father     History    Substance Use Topics  . Smoking status: Current Every Day Smoker -- 0.5 packs/day    Types: Cigarettes  . Smokeless tobacco: Current User  . Alcohol Use: Yes     Comment: Occasional      Review of Systems A complete 10 system review of systems was obtained and all systems are negative except as noted in the HPI and PMH.   Allergies  Aspirin  Home Medications   Current Outpatient Rx  Name  Route  Sig  Dispense  Refill  . ALPRAZOLAM 0.25 MG PO TABS   Oral   Take 1 tablet (0.25 mg total) by mouth 3 (three) times daily as needed for anxiety (air hunger, dyspnea).   30 tablet   0   . ALUM & MAG HYDROXIDE-SIMETH 200-200-20 MG/5ML PO SUSP   Oral   Take 30 mLs by mouth as needed.   355 mL   0   . BENZONATATE 200 MG PO CAPS   Oral   Take 1 capsule (200 mg total) by mouth 3 (three) times daily as needed for cough.   20 capsule   0   . DILTIAZEM HCL ER COATED BEADS 240 MG PO CP24   Oral   Take 1 capsule (240 mg total) by mouth daily.   30 capsule   0   . FUROSEMIDE 20  MG PO TABS   Oral   Take 1 tablet (20 mg total) by mouth daily.   90 tablet   3   . METFORMIN HCL 1000 MG PO TABS   Oral   Take 1,000 mg by mouth 2 (two) times daily.         [name] TAMSULOSIN HCL 0.4 MG PO CAPS   Oral   Take 1 capsule (0.4 mg total) by mouth daily after supper.   30 capsule   0   . ALBUTEROL SULFATE HFA 108 (90 BASE) MCG/ACT IN AERS   Inhalation   Inhale 2 puffs into the lungs 4 (four) times daily as needed. For shortness of breath/ COPD         . ALBUTEROL SULFATE (2.5 MG/3ML) 0.083% IN NEBU   Nebulization   Take 2.5 mg by nebulization 2 (two) times daily.         . BUDESONIDE-FORMOTEROL FUMARATE 160-4.5 MCG/ACT IN AERO   Inhalation   Inhale 2 puffs into the lungs 2 (two) times daily.   1 Inhaler   2   . GUAIFENESIN ER 600 MG PO TB12   Oral   Take 2 tablets (1,200 mg total) by mouth 2 (two) times daily.   60 tablet   0   . IPRATROPIUM BROMIDE 0.02 % IN SOLN    Nebulization   Take 500 mcg by nebulization 2 (two) times daily as needed. For chest congestion         . NICOTINE 7 MG/24HR TD PT24   Transdermal   Place 1 patch onto the skin daily.   28 patch   0   . SORBITOL 70 % SOLN   Oral   Take 30 mLs by mouth daily as needed.   500 mL   0   . TIOTROPIUM BROMIDE MONOHYDRATE 18 MCG IN CAPS   Inhalation   Place 18 mcg into inhaler and inhale daily.           . TRAZODONE 25 MG HALF TABLET   Oral   Take 0.5 tablets (25 mg total) by mouth at bedtime as needed for sleep (insomnia).   30 tablet   0   . TRAZODONE HCL 50 MG PO TABS   Oral   Take 1 tablet (50 mg total) by mouth at bedtime.   30 tablet   2     BP 156/59  Pulse 96  Temp 99.1 F (37.3 C) (Rectal)  Resp 25  SpO2 100%  Physical Exam  Nursing note and vitals reviewed. Constitutional: He appears well-developed and well-nourished. No distress.       Elderly. Disheveled.   HENT:  Head: Normocephalic and atraumatic.  Right Ear: External ear normal.  Left Ear: External ear normal.  Eyes: Conjunctivae normal are normal. Right eye exhibits no discharge. Left eye exhibits no discharge. No scleral icterus.  Neck: Neck supple. No tracheal deviation present.  Cardiovascular: Normal rate, regular rhythm and intact distal pulses.   Pulmonary/Chest: Effort normal. No stridor. No respiratory distress. He has wheezes. He has rhonchi. He has rales.       Crackles at both bases. Few expiratory wheezes bilaterally.   Abdominal: Soft. Bowel sounds are normal. He exhibits no distension. There is no tenderness. There is no rebound and no guarding.  Musculoskeletal: He exhibits edema (pitting, lower extremities bilaterally). He exhibits no tenderness.  Neurological: He is alert. He has normal strength. No sensory deficit. Cranial nerve deficit:  no gross defecits noted. He exhibits  normal muscle tone. He displays no seizure activity. Coordination normal.  Skin: Skin is warm and dry. No  rash noted.  Psychiatric: He has a normal mood and affect.    ED Course  Procedures (including critical care time) DIAGNOSTIC STUDIES: Oxygen Saturation is 96% on room air,  Adequate by my interpretation.    COORDINATION OF CARE: 1035- Patient informed of current plan for treatment and evaluation and agrees with plan at this time. Will give albuterol, atrovent, and solumedrol treatments. Will order chest x-ray, pro b, CBC, basic metabolic panel and troponin.  EKG Normal sinus rhythm rate 90 Normal intervals Normal axis No acute ST-T wave changes  We attempted to have the patient ambulate. He states he is unable to do so.  Labs Reviewed  CBC WITH DIFFERENTIAL - Abnormal; Notable for the following:    WBC 13.6 (*)     RBC 3.14 (*)     Hemoglobin 9.8 (*)     HCT 29.0 (*)     RDW 16.1 (*)     Platelets 112 (*)     Neutrophils Relative 84 (*)     Neutro Abs 11.5 (*)     Lymphocytes Relative 9 (*)     All other components within normal limits  BASIC METABOLIC PANEL - Abnormal; Notable for the following:    Glucose, Bld 121 (*)     GFR calc non Af Amer 84 (*)     All other components within normal limits  TROPONIN I  PRO B NATRIURETIC PEPTIDE   Dg Chest Portable 1 View  08/29/2012  *RADIOLOGY REPORT*  Clinical Data: Shortness of breath.  PORTABLE CHEST - 1 VIEW  Comparison: 08/18/2012  Findings: Stable COPD/emphysema.  No edema, infiltrate, pneumothorax or pleural effusion is identified.  Heart size is stable.  IMPRESSION: Stable COPD.   Original Report Authenticated By: Irish Lack, M.D.        MDM  The patient continues to feel short of breath despite his recent hospitalization.He does not appear to have a component of pulmonary edema but most likely has right-sided heart failure associated with his chronic COPD. His primary issue is his COPD exacerbation. There is a strong chronic component to this however the patient likely has an acute exacerbation. He unfortunately  continues to smoke and I did counsel him on smoking cessation. Considering his persistent symptoms I will consult the hospitalist service regarding admission for COPD exacerbation.   I personally performed the services described in this documentation, which was scribed in my presence. The recorded information has been reviewed and is accurate.    Celene Kras, MD 08/29/12 1214  Celene Kras, MD 08/29/12 [phone]

## 2012-08-29 NOTE — Progress Notes (Signed)
Patient is considered a high fall risk, but patient refuses to bed alarm to be turned on. Explained the importance of his safety and well-being but patient refuses and states "I will call nursing staff when I need to get up".

## 2012-08-29 NOTE — ED Notes (Signed)
Pt states that he is unable to walk at present.  I asked the patient how he gets around at home and he states that he can walk when his legs are not so swollen.  Pt requests pain medications and something to eat, MD made aware.

## 2012-08-29 NOTE — ED Notes (Signed)
Patient arrives via EMS with c/o shortness of breath, worsening over last several days. Patient with h/o CHF and COPD, on 2 L [place] at all times at home. Reports taking home nebs without relief today. Patient with noted 3+ pitting edema to feet, abdomen swollen. Patient is alert/oriented x 4. C/o chest pressure and "fluttering in heart".

## 2012-08-29 NOTE — H&P (Signed)
[facility] History and Physical  [name] ZOX:[id number] DOB: [birth year] DOA: 08/29/2012  Referring physician: Dr. Lynelle Doctor PCP: No primary provider on file.  Specialists:   Chief Complaint: shortness of breath, leg pain  HPI: [name] is a 72 y.o. male history of oxygen-dependent COPD was recently discharged from the hospital after review of her COPD exacerbation. Patient is known to me from prior hospitalization. He appears to have very advanced COPD and poor functional capacity at baseline. On his previous admission, he was treated for COPD exacerbation and atrial fibrillation with rapid ventricular response. He was felt to be a poor candidate for anticoagulation due to history of noncompliance and poor followup. Patient was discharged home on his home level of oxygen and reports that he had not done well. He continues to smoke after leaving the hospital. He reports a nonproductive cough. He did feel feverish last night but did not check his temperature. He has had no nausea vomiting, no diarrhea. He feels that he is wheezing. He was using his nebulizer at home without any significant benefit. He is also noted that his legs have continued to swell bilaterally and they become painful to walk. Review of record indicates that he was discharged on low-dose Lasix.  Review of Systems: pertinent positives as per history of present illness, otherwise negative   Past Medical History  Diagnosis Date  . Essential hypertension, benign   . Alcohol abuse   . PTSD (post-traumatic stress disorder)   . Chronic back pain   . Depression   . Peripheral neuropathy   . DJD (degenerative joint disease)   . COPD (chronic obstructive pulmonary disease)   . Anemia   . Myocardial infarction   . Type II or unspecified type diabetes mellitus without mention of complication, not stated as uncontrolled   . Esophageal dysmotility 07/04/2011  . Chronic respiratory failure with hypoxia   . Anxiety     Past Surgical History  Procedure Date  . Hernia repair     X 3  . Spinal surgeries     X 4  . Appendectomy   . Prostate surgery    Social History:  reports that he has been smoking Cigarettes.  He has been smoking about .5 packs per day. He uses smokeless tobacco. He reports that he drinks alcohol. He reports that he does not use illicit drugs. very poor functional capacity at baseline. He continues to smoke.   Allergies  Allergen Reactions  . Aspirin Other (See Comments)    Stomach upset    Family History  Problem Relation Age of Onset  . Lung disease Mother   . Prostate cancer Father      Prior to Admission medications   Medication Sig Start Date End Date Taking? Authorizing Provider  ALPRAZolam (XANAX) 0.25 MG tablet Take 1 tablet (0.25 mg total) by mouth 3 (three) times daily as needed for anxiety (air hunger, dyspnea). 08/24/12  Yes Rhetta Mura, MD  alum & mag hydroxide-simeth (MAALOX/MYLANTA) 200-200-20 MG/5ML suspension Take 30 mLs by mouth as needed. 08/24/12  Yes Rhetta Mura, MD  benzonatate (TESSALON) 200 MG capsule Take 1 capsule (200 mg total) by mouth 3 (three) times daily as needed for cough. 08/24/12  Yes Rhetta Mura, MD  diltiazem (CARDIZEM CD) 240 MG 24 hr capsule Take 1 capsule (240 mg total) by mouth daily. 08/24/12  Yes Rhetta Mura, MD  furosemide (LASIX) 20 MG tablet Take 1 tablet (20 mg total) by mouth daily. 08/25/12  Yes  Jonelle Sidle, MD  metFORMIN (GLUCOPHAGE) 1000 MG tablet Take 1,000 mg by mouth 2 (two) times daily.   Yes Historical Provider, MD  Tamsulosin HCl (FLOMAX) 0.4 MG CAPS Take 1 capsule (0.4 mg total) by mouth daily after supper. 08/24/12  Yes Rhetta Mura, MD  albuterol (PROVENTIL HFA) 108 (90 BASE) MCG/ACT inhaler Inhale 2 puffs into the lungs 4 (four) times daily as needed. For shortness of breath/ COPD    Historical Provider, MD  albuterol (PROVENTIL) (2.5 MG/3ML) 0.083% nebulizer solution Take 2.5 mg  by nebulization 2 (two) times daily.    Historical Provider, MD  budesonide-formoterol (SYMBICORT) 160-4.5 MCG/ACT inhaler Inhale 2 puffs into the lungs 2 (two) times daily. 07/04/11 09/15/12  Elliot Cousin, MD  guaiFENesin (MUCINEX) 600 MG 12 hr tablet Take 2 tablets (1,200 mg total) by mouth 2 (two) times daily. 08/24/12   Rhetta Mura, MD  ipratropium (ATROVENT) 0.02 % nebulizer solution Take 500 mcg by nebulization 2 (two) times daily as needed. For chest congestion    Historical Provider, MD  nicotine (NICODERM CQ - DOSED IN MG/24 HR) 7 mg/24hr patch Place 1 patch onto the skin daily. 08/24/12   Rhetta Mura, MD  sorbitol 70 % SOLN Take 30 mLs by mouth daily as needed. 08/24/12   Rhetta Mura, MD  tiotropium (SPIRIVA) 18 MCG inhalation capsule Place 18 mcg into inhaler and inhale daily.      Historical Provider, MD  traZODone (DESYREL) 25 mg TABS Take 0.5 tablets (25 mg total) by mouth at bedtime as needed for sleep (insomnia). 08/24/12   Rhetta Mura, MD  traZODone (DESYREL) 50 MG tablet Take 1 tablet (50 mg total) by mouth at bedtime. 07/04/11 08/03/11  Elliot Cousin, MD   Physical Exam: Filed Vitals:   08/29/12 1200 08/29/12 1300 08/29/12 1400 08/29/12 1459  BP: 151/55 154/72 129/47 136/68  Pulse: 94   111  Temp:    98.1 F (36.7 C)  TempSrc:      Resp: 19 21 22 20   SpO2: 99%   98%     General:  Patient is sitting up in bed, he does have mild increased work of breathing   Eyes:  pupils are equal and reactive to light  ENT:  mucous members are dry   Neck: Supple  Cardiovascular:  S1, S2, regular rate and rhythm, 2+ edema in the lower extremities bilaterally  Respiratory:  bilateral expiratory wheezes  Abdomen: Soft, nontender, nondistended, bowel sounds are active   Skin: deferred   Musculoskeletal:  referred  Psychiatric:  normal affect, cooperative with exam   Neurologic:  grossly intact, nonfocal   Labs on Admission:  Basic Metabolic  Panel:  Lab 08/29/12 1034 08/24/12 0238 08/23/12 0416  NA 138 136 --  K 3.5 3.8 --  CL 98 98 --  CO2 32 22 --  GLUCOSE 121* 137* --  BUN 12 39* --  CREATININE 0.90 1.08 1.12  CALCIUM 8.7 8.7 --  MG -- -- --  PHOS -- -- --   Liver Function Tests: No results found for this basename: AST:5,ALT:5,ALKPHOS:5,BILITOT:5,PROT:5,ALBUMIN:5 in the last 168 hours No results found for this basename: LIPASE:5,AMYLASE:5 in the last 168 hours No results found for this basename: AMMONIA:5 in the last 168 hours CBC:  Lab 08/29/12 1034 08/24/12 0238  WBC 13.6* 16.7*  NEUTROABS 11.5* --  HGB 9.8* 10.8*  HCT 29.0* 31.1*  MCV 92.4 90.1  PLT 112* 177   Cardiac Enzymes:  Lab 08/29/12 1034 08/24/12 0237  CKTOTAL -- --  CKMB -- --  CKMBINDEX -- --  TROPONINI <0.30 <0.30    BNP (last 3 results)  Basename 08/29/12 1043 08/19/12 0423  PROBNP 555.5* 5740.0*   CBG:  Lab 08/24/12 1202 08/24/12 0742 08/23/12 2119 08/23/12 1707 08/23/12 1211  GLUCAP 129* 134* 140* 120* 144*    Radiological Exams on Admission: Dg Chest Portable 1 View  08/29/2012  *RADIOLOGY REPORT*  Clinical Data: Shortness of breath.  PORTABLE CHEST - 1 VIEW  Comparison: 08/18/2012  Findings: Stable COPD/emphysema.  No edema, infiltrate, pneumothorax or pleural effusion is identified.  Heart size is stable.  IMPRESSION: Stable COPD.   Original Report Authenticated By: Irish Lack, M.D.     EKG: Independently reviewed.  normal sinus rhythm, no acute ST or T changes   Assessment/Plan Active Problems:  COPD with exacerbation  DM type 2 (diabetes mellitus, type 2)  HTN (hypertension)  Anxiety  Tobacco abuse  Anemia  Physical deconditioning  Atrial fibrillation  Cor pulmonale  Acute-on-chronic respiratory failure   1. Acute on chronic respiratory failure, likely related to COPD exacerbation. 2. COPD exacerbation. Start the patient on steroids, antibiotics and nebulizer treatments. 3. Pedal edema. Check venous  Dopplers to rule out underlying DVT. Patient likely has an element of right-sided heart failure due to his advanced COPD. We'll start the patient on low dose of Lasix. 4. Tobacco abuse. Counseled extensively on the importance of cessation 5. History of perforation. Currently in sinus rhythm. Continue outpatient medications. I daily for anticoagulation 6. Hypertension. Continue outpatient meds 7. Diabetes. Start sliding scale insulin 8. Deconditioning. We'll ask physical therapy to reassess. Patient would like to be discharged to a nursing home if he qualifies or an assisted-living. We will ask social work to consult.    Code Status: Limited code. Patient would not want to receive CPR although he is agreeable to short-term intubation.  Family Communication:  discussed with patient at the bedside Disposition Plan:  will likely discharge to facility when he is medically stable. Anticipate he should be ready in a few days.   Time spent:  MEMON,JEHANZEB [facility] Pager [phone]  If 7PM-7AM, please contact night-coverage [email or web address] Password [facility] 08/29/2012, 4:15 PM

## 2012-08-29 NOTE — ED Notes (Signed)
Spoke with Dr. Lynelle Doctor, was told to cancel the sitter with pt. Notified AC.

## 2012-08-29 NOTE — ED Notes (Signed)
Patient stating suicidal thoughts when questioned further, patient just states "I don't have the nerve to do it, but I have had thoughts" Relates SI to illness.

## 2012-08-30 ENCOUNTER — Ambulatory Visit (HOSPITAL_COMMUNITY): Payer: Medicare Other

## 2012-08-30 DIAGNOSIS — E119 Type 2 diabetes mellitus without complications: Secondary | ICD-10-CM

## 2012-08-30 LAB — CBC
Hemoglobin: 9.3 g/dL — ABNORMAL LOW (ref 13.0–17.0)
MCH: 31 pg (ref 26.0–34.0)
MCHC: 33.5 g/dL (ref 30.0–36.0)
RDW: 15.8 % — ABNORMAL HIGH (ref 11.5–15.5)

## 2012-08-30 LAB — GLUCOSE, CAPILLARY
Glucose-Capillary: 257 mg/dL — ABNORMAL HIGH (ref 70–99)
Glucose-Capillary: 212 mg/dL — ABNORMAL HIGH (ref 70–99)
Glucose-Capillary: 180 mg/dL — ABNORMAL HIGH (ref 70–99)
Glucose-Capillary: 137 mg/dL — ABNORMAL HIGH (ref 70–99)

## 2012-08-30 LAB — BASIC METABOLIC PANEL
BUN: 22 mg/dL (ref 6–23)
Creatinine, Ser: 0.89 mg/dL (ref 0.50–1.35)
GFR calc non Af Amer: 85 mL/min — ABNORMAL LOW (ref >90)
Glucose, Bld: 300 mg/dL — ABNORMAL HIGH (ref 70–99)
Potassium: 3.7 mEq/L (ref 3.5–5.1)

## 2012-08-30 MED ORDER — DIPHENHYDRAMINE HCL 25 MG PO CAPS
25.0000 mg | ORAL_CAPSULE | Freq: Four times a day (QID) | ORAL | Status: DC | PRN
  Administered 2012-08-30: 25 mg via ORAL
  Filled 2012-08-30: qty 1

## 2012-08-30 MED ORDER — FUROSEMIDE 10 MG/ML IJ SOLN
20.0000 mg | Freq: Two times a day (BID) | INTRAMUSCULAR | Status: DC
  Administered 2012-08-30 – 2012-09-02 (×7): 20 mg via INTRAVENOUS
  Filled 2012-08-30 (×7): qty 2

## 2012-08-30 MED ORDER — METHYLPREDNISOLONE SODIUM SUCC 40 MG IJ SOLR
40.0000 mg | Freq: Four times a day (QID) | INTRAMUSCULAR | Status: AC
  Administered 2012-08-30 – 2012-08-31 (×7): 40 mg via INTRAVENOUS
  Filled 2012-08-30 (×7): qty 1

## 2012-08-30 MED ORDER — LEVOFLOXACIN 500 MG PO TABS
500.0000 mg | ORAL_TABLET | Freq: Every day | ORAL | Status: DC
  Administered 2012-08-30 – 2012-09-01 (×3): 500 mg via ORAL
  Filled 2012-08-30 (×3): qty 1

## 2012-08-30 MED ORDER — INSULIN ASPART 100 UNIT/ML ~~LOC~~ SOLN
0.0000 [IU] | Freq: Three times a day (TID) | SUBCUTANEOUS | Status: DC
  Administered 2012-08-30: 4 [IU] via SUBCUTANEOUS
  Administered 2012-08-30: 3 [IU] via SUBCUTANEOUS
  Administered 2012-08-31: 7 [IU] via SUBCUTANEOUS
  Administered 2012-08-31: 11 [IU] via SUBCUTANEOUS
  Administered 2012-08-31: 4 [IU] via SUBCUTANEOUS
  Administered 2012-09-01: 7 [IU] via SUBCUTANEOUS
  Administered 2012-09-01 – 2012-09-03 (×3): 3 [IU] via SUBCUTANEOUS
  Administered 2012-09-03: 11 [IU] via SUBCUTANEOUS
  Administered 2012-09-04: 4 [IU] via SUBCUTANEOUS

## 2012-08-30 MED ORDER — GLUCERNA SHAKE PO LIQD
237.0000 mL | Freq: Three times a day (TID) | ORAL | Status: DC
  Administered 2012-08-30 – 2012-09-03 (×6): 237 mL via ORAL

## 2012-08-30 NOTE — Evaluation (Signed)
Physical Therapy Evaluation Patient Details Name: [name] MRN: [id number] DOB: February 26, 1941 Today's Date: 08/30/2012 Time: 7829-5621 PT Time Calculation (min): 24 min  PT Assessment / Plan / Recommendation Clinical Impression  There has been no change in pt's functiona since his last admission.  He is currently independent in gait with a walker.  His endurance is somewhat limited due to DOE.  He is normally O2 dependent, however he wanted to see how he would tolerate activity on RA.  On O2 at 2 L/min his O2 sat was 98%...on RA after ambulating 150' his O2 sat was 95% (although he was dyspneic).  Pt did say that he continues to smoke while on O2.  He states that he has difficulty caring for himself at home but he doesn't want to give up his home to go into [facility] as he would like to leave his home to his children upon his death.  I do not think that PT is the answer to his problems.    PT Assessment  Patent does not need any further PT services    Follow Up Recommendations  No PT follow up    Does the patient have the potential to tolerate intense rehabilitation      Barriers to Discharge        Equipment Recommendations       Recommendations for Other Services     Frequency      Precautions / Restrictions Precautions Precautions: None Restrictions Weight Bearing Restrictions: No   Pertinent Vitals/Pain       Mobility  Bed Mobility Supine to Sit: 7: Independent;HOB flat Sit to Supine: 7: Independent;HOB flat Transfers Sit to Stand: 7: Independent;From bed;With upper extremity assist Stand to Sit: 7: Independent;To bed;With upper extremity assist Ambulation/Gait Ambulation/Gait Assistance: 6: Modified independent (Device/Increase time) Ambulation Distance (Feet): 150 Feet Assistive device: Rolling walker Ambulation/Gait Assistance Details: pt ambulated on RA...became dyspneic and needed frequent stops to catch his breath, however, O2 sat was 95% Gait Pattern:  Decreased dorsiflexion - left Gait velocity: slow, frequent rests needed Stairs: No Wheelchair Mobility Wheelchair Mobility: No    Shoulder Instructions     Exercises     PT Diagnosis:    PT Problem List:   PT Treatment Interventions:     PT Goals    Visit Information  Last PT Received On: 08/30/12    Subjective Data  Subjective: I can breath fine without this oxygen Patient Stated Goal: needs some help to take care of himself   Prior Functioning  Home Living Lives With: Alone Available Help at Discharge: Friend(s) Type of Home: House Home Access: Stairs to enter [employer] of Steps: 3 Entrance Stairs-Rails: Right;Can reach both;Left Home Layout: One level Bathroom Shower/Tub: Engineer, manufacturing systems: Standard Home Adaptive Equipment: Walker - rolling;Straight cane;Bedside commode/3-in-1 Prior Function Level of Independence: Independent with assistive device(s) Able to Take Stairs?: Yes Driving: Yes Vocation: Retired Musician: No difficulties    Cognition  Overall Cognitive Status: Appears within functional limits for tasks assessed/performed Arousal/Alertness: Awake/alert Orientation Level: Appears intact for tasks assessed Behavior During Session: [facility] for tasks performed    Extremity/Trunk Assessment Right Lower Extremity Assessment RLE ROM/Strength/Tone: Within functional levels Left Lower Extremity Assessment LLE ROM/Strength/Tone: Within functional levels LLE ROM/Strength/Tone Deficits: mild drop foot L...has a brace for this foot, but states that it is broken   Balance    End of Session PT - End of Session Equipment Utilized During Treatment: Gait belt Activity Tolerance: Patient tolerated  treatment well Patient left: in bed;with call bell/phone within reach  GP     [name] L 08/30/2012, 11:01 AM

## 2012-08-30 NOTE — Progress Notes (Signed)
Inpatient Diabetes Program Recommendations  AACE/ADA: New Consensus Statement on Inpatient Glycemic Control (2013)  Target Ranges:  Prepandial:   less than 140 mg/dL      Peak postprandial:   less than 180 mg/dL (1-2 hours)      Critically ill patients:  140 - 180 mg/dL   Results for HOUA, ACKERT (MRN [id number]) as of 08/30/2012 08:15  Ref. Range 08/29/2012 17:28 08/29/2012 20:33 08/30/2012 07:07  Glucose-Capillary Latest Range: 70-99 mg/dL 409 (H) 811 (H) 914 (H)    Inpatient Diabetes Program Recommendations Insulin - Basal: May want to consider ordering low dose basal insulin while patient is on steroids. Correction (SSI): Please consider increasing Novolog correction to resistant scale.  Note: Patient has a history of diabetes and takes Metformin 1000 mg BID at home for diabetes management.  Currently ordered Novolog moderate correction scale ACHS for inpatient glycemic control.  Please consider increasing Novolog correction scale to Resistant scale and may want to consider starting low dose basal insulin while patient is on steroids.  Will continue to follow.  Thanks, Orlando Penner, RN, BSN, CCRN Diabetes Coordinator Inpatient Diabetes Program [phone]

## 2012-08-30 NOTE — Clinical Social Work Psychosocial (Signed)
Clinical Social Work Department BRIEF PSYCHOSOCIAL ASSESSMENT 08/30/2012  Patient:  [name], [name]     Account Number:  [id number]     Admit date:  08/29/2012  Clinical Social Worker:  Nancie Neas  Date/Time:  08/30/2012 11:55 AM  Referred by:  Physician  Date Referred:  08/30/2012 Referred for  ALF Placement   Other Referral:   Interview type:  Patient Other interview type:    PSYCHOSOCIAL DATA Living Status:  ALONE Admitted from facility:   Level of care:   Primary support name:  Windy Fast Primary support relationship to patient:  CHILD, ADULT Degree of support available:   supportive    CURRENT CONCERNS Current Concerns  Post-Acute Placement   Other Concerns:    SOCIAL WORK ASSESSMENT / PLAN CSW met with pt at bedside. Pt well known to CSW from previous admissions. He is alert and oriented. Pt d/c last week home with home health. He states that he had swelling in his legs which impacted his walking and his home health RN suggested he come to ED. Pt feels he was d/c too soon. After d/c, pt and his son began talking to work out some issues. Windy Fast has been coming around more and pt said he was offered for pt to stay at his house some. His niece is also willing to stay with pt to help him when he is at his own home. He is okay with resuming [facility] and [facility] [place] Pt admits to not taking care of himself as he needs to and is hoping that this new involvement from family is going to help him progress. Pt is not interested in going to ALF.   Assessment/plan status:  Referral to [facility] Other assessment/ plan:   Information/referral to community resources:   CM to resume home health    PATIENT'S/FAMILY'S RESPONSE TO PLAN OF CARE: Pt is hopeful that with family's assistance he can manage better at home. He does not want to pursue ALF. CSW signing off but can be reconsulted if needed.         Derenda Fennel, [place] [phone]

## 2012-08-30 NOTE — Progress Notes (Signed)
Pt educated several times today on keeping his feet elevated in order to help improve the edema in his lower extremities.  Pt refuses to sit in bed with feet elevated.  Educated patient that he can sit with the back of the bed upright and elevate his legs.  However, patient insists on sitting on the side of the bed at this bedside table.  Will continue to monitor.

## 2012-08-30 NOTE — Progress Notes (Signed)
[facility] PROGRESS NOTE  [name]:[id number] DOB: 17-Jul-1941 DOA: 08/29/2012 PCP: No primary provider on file.  Assessment/Plan: 1.Acute on chronic respiratory failure, likely related to COPD exacerbation. Slight improvement. sats 92-96 on 2L, suspect this is close to baseline. Continue oxygen, nebs, solumedrol but will begin taper.   2. COPD exacerbation. No wheezes. Continue solumedrol but taper to 40mg . Continue levaquin but will change to po. Continue nebs.   3. Pedal edema. No real improvement as yet. Await venous Dopplers to rule out underlying DVT. Patient likely has an element of right-sided heart failure due to his advanced COPD. Has had 1 dose IV lasix and will continue at current dose. Wt up to 72.4 from 71.9kg. Continue TEDS. Monitor  4. Tobacco abuse. Counseled extensively on the importance of cessation.  5. Anemia: hx of same. Likely related to chronic disease. Hg appears to be stable at baseline. No s/sx of bleeding.   6. History of a fib. Currently in sinus rhythm. Continue outpatient medications.  7.Hypertension. SBP range 109-133. Continue outpatient meds  8. Diabetes. CBG range 193-357.  Likely related to steroids. Will continue sliding scale insulin but change to resistant.  9. Deconditioning.Physical therapy to reassess. Patient would like to be discharged to a nursing home if he qualifies or an assisted-living. We will ask social work to consult  10. Anxiety: currently anxious about discharge and getting "a place". Seems at baseline. Continue home meds  Code Status: no CPR, ok for short term intubation Family Communication: pt at bedside Disposition Plan: will need facility. Hopefully 1-2days   Consultants:  none  Procedures:  none  Antibiotics:  levaquin 08/29/12>>>>  HPI/Subjective: Sitting on side of bed eating. Reports little improvement in breathing effort. Complains of sore throat and anxiety about where he will go at discharge.     Objective: Filed Vitals:   08/29/12 2137 08/30/12 0116 08/30/12 0429 08/30/12 0726  BP: 133/60  109/63   Pulse: 79  80   Temp: 97.2 F (36.2 C)  97.1 F (36.2 C)   TempSrc: Oral  Oral   Resp: 20  20   Height:      Weight:   72.4 kg (159 lb 9.8 oz)   SpO2: 94% 96% 95% 94%    Intake/Output Summary (Last 24 hours) at 08/30/12 0906 Last data filed at 08/30/12 9562  Gross per 24 hour  Intake    480 ml  Output    875 ml  Net   -395 ml   Filed Weights   08/29/12 1920 08/30/12 0429  Weight: 71.9 kg (158 lb 8.2 oz) 72.4 kg (159 lb 9.8 oz)    Exam:   General:  Sitting on side of bed. Appears mildly uncomfortable.   Cardiovascular: RRR No MGR 2+LEE  Bilaterally TEDS intact  Respiratory: mild to moderate increased work of breathing with conversation. Unable to complete sentences. Only fair air movement posterior mid and base levels. Anterior lobes with rhonchi and mild wheeze  Abdomen: soft non-tender to palpation.   Data Reviewed: Basic Metabolic Panel:  Lab 08/30/12 1308 08/29/12 1034 08/24/12 0238  NA 135 138 136  K 3.7 3.5 3.8  CL 95* 98 98  CO2 30 32 22  GLUCOSE 300* 121* 137*  BUN 22 12 39*  CREATININE 0.89 0.90 1.08  CALCIUM 8.7 8.7 8.7  MG -- -- --  PHOS -- -- --   Liver Function Tests: No results found for this basename: AST:5,ALT:5,ALKPHOS:5,BILITOT:5,PROT:5,ALBUMIN:5 in the last 168 hours No results  found for this basename: LIPASE:5,AMYLASE:5 in the last 168 hours No results found for this basename: AMMONIA:5 in the last 168 hours CBC:  Lab 08/30/12 0350 08/29/12 1034 08/24/12 0238  WBC 13.6* 13.6* 16.7*  NEUTROABS -- 11.5* --  HGB 9.3* 9.8* 10.8*  HCT 27.8* 29.0* 31.1*  MCV 92.7 92.4 90.1  PLT 118* 112* 177   Cardiac Enzymes:  Lab 08/29/12 1034 08/24/12 0237  CKTOTAL -- --  CKMB -- --  CKMBINDEX -- --  TROPONINI <0.30 <0.30   BNP (last 3 results)  Basename 08/29/12 1043 08/19/12 0423  PROBNP 555.5* 5740.0*   CBG:  Lab 08/30/12 0707  08/29/12 2033 08/29/12 1728 08/24/12 1202 08/24/12 0742  GLUCAP 257* 353* 193* 129* 134*    No results found for this or any previous visit (from the past 240 hour(s)).   Studies: Dg Chest Portable 1 View  08/29/2012  *RADIOLOGY REPORT*  Clinical Data: Shortness of breath.  PORTABLE CHEST - 1 VIEW  Comparison: 08/18/2012  Findings: Stable COPD/emphysema.  No edema, infiltrate, pneumothorax or pleural effusion is identified.  Heart size is stable.  IMPRESSION: Stable COPD.   Original Report Authenticated By: Irish Lack, M.D.     Scheduled Meds:   . budesonide-formoterol  2 puff Inhalation BID  . diltiazem  240 mg Oral Daily  . enoxaparin (LOVENOX) injection  40 mg Subcutaneous Q24H  . furosemide  20 mg Intravenous Daily  . guaiFENesin  1,200 mg Oral BID  . insulin aspart  0-20 Units Subcutaneous TID WC  . insulin aspart  0-5 Units Subcutaneous QHS  . ipratropium  0.5 mg Nebulization Q6H  . levalbuterol  0.63 mg Nebulization Q6H  . levofloxacin  500 mg Oral Daily  . methylPREDNISolone (SOLU-MEDROL) injection  40 mg Intravenous Q6H  . nicotine  7 mg Transdermal Q24H  . Tamsulosin HCl  0.4 mg Oral QPC supper   Continuous Infusions:   Principal Problem:  *Acute-on-chronic respiratory failure Active Problems:  COPD with exacerbation  DM type 2 (diabetes mellitus, type 2)  HTN (hypertension)  Anxiety  Tobacco abuse  Anemia  Physical deconditioning  Atrial fibrillation  Cor pulmonale    Time spent: 35 minutes    [facility] M  [facility]  If 8PM-8AM, please contact night-coverage at [email or web address], password [facility] 08/30/2012, 9:06 AM  LOS: 1 day     Attending note:  Patient seen and examined.  Agree with plan as above.  Taper steroids and change abx to po.  He is likely approaching baseline resp status.  Anxiety playing a large role here. Will continue diuresis with IV lasix. He wishes to return home on discharge.

## 2012-08-30 NOTE — Progress Notes (Signed)
INITIAL NUTRITION ASSESSMENT  DOCUMENTATION CODES Per approved criteria  -Not Applicable   INTERVENTION: Add snack q HS Glucerna Shake po q a.m., provides 220 kcal and 10 grams of protein.  NUTRITION DIAGNOSIS: Inadequate oral intake related to shortness of breath as evidenced by limited oral intatke.   Goal: Pt to meet >/= 90% of their estimated nutrition needs; not  met  Monitor:  Meals, supplement intake and labs  Reason for Assessment: Malnutrition Screen  72 y.o. male  Admitting Dx: Acute-on-chronic respiratory failure  ASSESSMENT: Pt reports severe wt gain of 14% above UBW. He has good appetite and is requesting snacks daily. He follows a Regular diet at home. Increased nutrition needs with increased work of breathing. He does not meet criteria for malnutrition at this time but is certainly at risk.    Height: Ht Readings from Last 1 Encounters:  08/29/12 5' 2.5" (1.588 m)    Weight: Wt Readings from Last 1 Encounters:  08/30/12 159 lb 9.8 oz (72.4 kg)    Ideal Body Weight: 124# (56.3 kg)  % Ideal Body Weight: 128%  Wt Readings from Last 10 Encounters:  08/30/12 159 lb 9.8 oz (72.4 kg)  08/24/12 171 lb 4.8 oz (77.701 kg)  06/15/12 140 lb (63.504 kg)  07/04/11 139 lb 3.2 oz (63.141 kg)    Usual Body Weight: 135-140#  % Usual Body Weight: 114%  BMI:  Body mass index is 28.73 kg/(m^2).Overweight   Estimated Nutritional Needs: Kcal: 1800-2150 Protein: 79-86 gr Fluid: > 2100 ml   Skin: hx of healed diabetic ulcer  Diet Order: Carb Control  EDUCATION NEEDS: -Education needs addressed   Intake/Output Summary (Last 24 hours) at 08/30/12 1312 Last data filed at 08/30/12 1206  Gross per 24 hour  Intake    480 ml  Output   1225 ml  Net   -745 ml    Last BM: 08/29/12   Labs:   Lab 08/30/12 0350 08/29/12 1034 08/24/12 0238  NA 135 138 136  K 3.7 3.5 3.8  CL 95* 98 98  CO2 30 32 22  BUN 22 12 39*  CREATININE 0.89 0.90 1.08  CALCIUM 8.7  8.7 8.7  MG -- -- --  PHOS -- -- --  GLUCOSE 300* 121* 137*    CBG (last 3)   Basename 08/30/12 1126 08/30/12 0707 08/29/12 2033  GLUCAP 212* 257* 353*    Scheduled Meds:   . budesonide-formoterol  2 puff Inhalation BID  . diltiazem  240 mg Oral Daily  . enoxaparin (LOVENOX) injection  40 mg Subcutaneous Q24H  . furosemide  20 mg Intravenous Daily  . guaiFENesin  1,200 mg Oral BID  . insulin aspart  0-20 Units Subcutaneous TID WC  . insulin aspart  0-5 Units Subcutaneous QHS  . ipratropium  0.5 mg Nebulization Q6H  . levalbuterol  0.63 mg Nebulization Q6H  . levofloxacin  500 mg Oral Daily  . methylPREDNISolone (SOLU-MEDROL) injection  40 mg Intravenous Q6H  . nicotine  7 mg Transdermal Q24H  . Tamsulosin HCl  0.4 mg Oral QPC supper    Continuous Infusions:   Past Medical History  Diagnosis Date  . Essential hypertension, benign   . Alcohol abuse   . PTSD (post-traumatic stress disorder)   . Chronic back pain   . Depression   . Peripheral neuropathy   . DJD (degenerative joint disease)   . COPD (chronic obstructive pulmonary disease)   . Anemia   . Myocardial infarction   .  Type II or unspecified type diabetes mellitus without mention of complication, not stated as uncontrolled   . Esophageal dysmotility 07/04/2011  . Chronic respiratory failure with hypoxia   . Anxiety     Past Surgical History  Procedure Date  . Hernia repair     X 3  . Spinal surgeries     X 4  . Appendectomy   . Prostate surgery     [phone]

## 2012-08-30 NOTE — Progress Notes (Signed)
UR Chart Review Completed  

## 2012-08-30 NOTE — Care Management Note (Signed)
    Page 1 of 2   09/06/2012     1:25:23 PM   CARE MANAGEMENT NOTE 09/06/2012  Patient:  [name], [name]   Account Number:  [id number]  Date Initiated:  08/30/2012  Documentation initiated by:  Rosemary Holms  Subjective/Objective Assessment:   Pt admitted from home where he lives alone. Pt states he has talked to his son who will assist him at DC. His neice will also be coming to stay with him. If DC before his neice can get to town then he will DC to son's home short term.     Action/Plan:   DC home   Anticipated DC Date:  09/05/2012   Anticipated DC Plan:  HOME W HOME HEALTH SERVICES  In-house referral  Clinical Social Worker      DC Planning Services  CM consult      Choice offered to / List presented to:          [facility] arranged  HH-1 RN  HH-10 DISEASE MANAGEMENT      HH agency  [facility]   Status of service:  Completed, signed off Medicare Important Message given?  YES (If response is "NO", the following Medicare IM given date fields will be blank) Date Medicare IM given:  09/01/2012 Date Additional Medicare IM given:    Discharge Disposition:  HOME W HOME HEALTH SERVICES  Per UR Regulation:    If discussed at Long Length of Stay Meetings, dates discussed:    Comments:  09/03/12 Rosemary Holms RN BSN CM spoke to pt who is somewhat agitated. States he needs assistance getting groceries...and does not understand that [facility] will not assist with this. States his neice should be available tomorrow to assist him.  09/02/12 Rosemary Holms RN BSN CM Pt now has SBO with NG. CM will follow  09/01/12 Rosemary Holms RN BSN CM plans to DC tomorrow and resume HH RN/Aide  08/30/12 Rosemary Holms RN BSN CM

## 2012-08-30 NOTE — Progress Notes (Signed)
Pt has red patches to upper thighs, abdomen and chest.  Pt stated this morning that this was not new.  However, this evening when asking patient about it, patient stated it "appeared last night".  States that it does itch but has not been scratching.  Dr. Kerry Hough notified.

## 2012-08-30 NOTE — Progress Notes (Signed)
Patient is considered a high fall risk.  Patient refuses the bed alarm.  Educated patient on the bed alarm.  Pt continues to refuse.  States that he " knows to call the nursing staff and will call the nursing staff if assistance is needed."

## 2012-08-31 DIAGNOSIS — I279 Pulmonary heart disease, unspecified: Secondary | ICD-10-CM

## 2012-08-31 LAB — GLUCOSE, CAPILLARY
Glucose-Capillary: 256 mg/dL — ABNORMAL HIGH (ref 70–99)
Glucose-Capillary: 227 mg/dL — ABNORMAL HIGH (ref 70–99)

## 2012-08-31 MED ORDER — PREDNISONE 20 MG PO TABS
60.0000 mg | ORAL_TABLET | Freq: Every day | ORAL | Status: DC
  Administered 2012-09-01: 60 mg via ORAL
  Filled 2012-08-31: qty 3

## 2012-08-31 NOTE — Progress Notes (Deleted)
[name] PROGRESS NOTE  [name] ZOX:[id number] DOB: August 12, 1940 DOA: 08/29/2012 PCP: No primary provider on file.  Assessment/Plan: 1.Acute on chronic respiratory failure, likely related to COPD exacerbation. Slight improvement. sats 92-96 on 2L, suspect this is close to baseline. Continue oxygen, nebs, prednisone taper.   2. COPD exacerbation. No wheezes. Continue solumedrol but taper to 40mg . Continue levaquin but will change to po. Continue nebs.   3. Pedal edema. No real improvement as yet. Await venous Dopplers to rule out underlying DVT. Patient likely has an element of right-sided heart failure due to his advanced COPD. Has had 1 dose IV lasix and will continue at current dose. Wt up to 72.4 from 71.9kg. Continue TEDS. Monitor  4. Tobacco abuse. Counseled extensively on the importance of cessation.  5. Anemia: hx of same. Likely related to chronic disease. Hg appears to be stable at baseline. No s/sx of bleeding.   6. History of a fib. Currently in sinus rhythm. Continue outpatient medications.  7.Hypertension. SBP range 109-133. Continue outpatient meds  8. Diabetes. CBG range 193-357.  Likely related to steroids. Will continue sliding scale insulin but change to resistant.  9. Deconditioning.Physical therapy to reassess. Patient would like to be discharged to a nursing home if he qualifies or an assisted-living. We will ask social work to consult  10. Anxiety: currently anxious about discharge and getting "a place". Seems at baseline. Continue home meds  Code Status: no CPR, ok for short term intubation Family Communication: pt at bedside Disposition Plan: will need facility. Hopefully 1-2days   Consultants:  none  Procedures:  none  Antibiotics:  levaquin 08/29/12>>>>  HPI/Subjective: Sitting on side of bed eating. Reports little improvement in breathing effort. Complains of sore throat and anxiety about where he will go at discharge.    Objective: Filed Vitals:   08/31/12 0545 08/31/12 0707 08/31/12 0737 08/31/12 1425  BP: 135/59     Pulse: 98     Temp:      TempSrc:      Resp: 20     Height:      Weight:   73.2 kg (161 lb 6 oz)   SpO2: 99% 99%  99%    Intake/Output Summary (Last 24 hours) at 08/31/12 1459 Last data filed at 08/30/12 2300  Gross per 24 hour  Intake    360 ml  Output    550 ml  Net   -190 ml   Filed Weights   08/29/12 1920 08/30/12 0429 08/31/12 0737  Weight: 71.9 kg (158 lb 8.2 oz) 72.4 kg (159 lb 9.8 oz) 73.2 kg (161 lb 6 oz)    Exam:   General:  Sitting on side of bed. Appears mildly uncomfortable.   Cardiovascular: RRR No MGR 2+LEE  Bilaterally TEDS intact  Respiratory: mild to moderate increased work of breathing with conversation. Unable to complete sentences. Only fair air movement posterior mid and base levels. Anterior lobes with rhonchi and mild wheeze  Abdomen: soft non-tender to palpation.   Data Reviewed: Basic Metabolic Panel:  Lab 08/30/12 8119 08/29/12 1034  NA 135 138  K 3.7 3.5  CL 95* 98  CO2 30 32  GLUCOSE 300* 121*  BUN 22 12  CREATININE 0.89 0.90  CALCIUM 8.7 8.7  MG -- --  PHOS -- --   Liver Function Tests: No results found for this basename: AST:5,ALT:5,ALKPHOS:5,BILITOT:5,PROT:5,ALBUMIN:5 in the last 168 hours No results found for this basename: LIPASE:5,AMYLASE:5 in the last 168 hours No results found  for this basename: AMMONIA:5 in the last 168 hours CBC:  Lab 08/30/12 0350 08/29/12 1034  WBC 13.6* 13.6*  NEUTROABS -- 11.5*  HGB 9.3* 9.8*  HCT 27.8* 29.0*  MCV 92.7 92.4  PLT 118* 112*   Cardiac Enzymes:  Lab 08/29/12 1034  CKTOTAL --  CKMB --  CKMBINDEX --  TROPONINI <0.30   BNP (last 3 results)  Basename 08/29/12 1043 08/19/12 0423  PROBNP 555.5* 5740.0*   CBG:  Lab 08/31/12 1120 08/31/12 0753 08/30/12 2038 08/30/12 1703 08/30/12 1342  GLUCAP 250* 256* 177* 137* 180*    No results found for this or any previous visit  (from the past 240 hour(s)).   Studies: US Venous Img Lower Bilateral  08/30/2012  *RADIOLOGY REPORT*  Clinical Data: swelling, rule out DVT;;  BILATERAL LOWER EXTREMITY VENOUS DUPLEX ULTRASOUND  Technique: Gray-scale sonography with compression, as well as color and duplex ultrasound, were performed to evaluate the deep venous system from the level of the common femoral vein through the popliteal and proximal calf veins.  Comparison: None.  Findings:  Normal compressibility and normal Doppler signal within the common femoral, superficial femoral and popliteal veins, down to the proximal calf veins.  No grayscale filling defects to suggest DVT.  IMPRESSION: No evidence of bilateral lower extremity deep vein thrombosis.   Original Report Authenticated By: Charlett Nose, M.D.     Scheduled Meds:    . budesonide-formoterol  2 puff Inhalation BID  . diltiazem  240 mg Oral Daily  . enoxaparin (LOVENOX) injection  40 mg Subcutaneous Q24H  . feeding supplement  237 mL Oral TID BM  . furosemide  20 mg Intravenous BID  . guaiFENesin  1,200 mg Oral BID  . insulin aspart  0-20 Units Subcutaneous TID WC  . insulin aspart  0-5 Units Subcutaneous QHS  . ipratropium  0.5 mg Nebulization Q6H  . levalbuterol  0.63 mg Nebulization Q6H  . levofloxacin  500 mg Oral Daily  . methylPREDNISolone (SOLU-MEDROL) injection  40 mg Intravenous Q6H  . nicotine  7 mg Transdermal Q24H  . predniSONE  60 mg Oral Q breakfast  . Tamsulosin HCl  0.4 mg Oral QPC supper   Continuous Infusions:   Principal Problem:  *Acute-on-chronic respiratory failure Active Problems:  COPD with exacerbation  DM type 2 (diabetes mellitus, type 2)  HTN (hypertension)  Anxiety  Tobacco abuse  Anemia  Physical deconditioning  Atrial fibrillation  Cor pulmonale    Time spent: 35 minutes    Mariaha Ellington  [facility]  If 8PM-8AM, please contact night-coverage at [email or web address], password [facility] 08/31/2012, 2:59 PM  LOS: 2  days     Attending note:  Patient seen and examined.  Agree with plan as above.  Taper steroids.  He is likely approaching baseline resp status.  Anxiety playing a large role here. Will continue diuresis with IV lasix. He wishes to return home on discharge. Will need further diuresis prior to discharge.

## 2012-08-31 NOTE — Progress Notes (Signed)
[facility] PROGRESS NOTE  [name] WGN:[id number] DOB: 11/11/1940 DOA: 08/29/2012 PCP: No primary provider on file.  Assessment/Plan: 1.Acute on chronic respiratory failure, likely related to COPD exacerbation. Stable at what appears to be baseline. sats 99-100 on 2L. While respiratory effort seems the same, pt is less anxious today and reports "breathing better". Continue oxygen, nebs and continue to taper prednisone slowly.    2. COPD exacerbation. No wheezes. Continue solumedrol but taper. Continue levaquin day #3.  See problem #1.   3. Pedal edema. Improved somewhat today with increase in IV lasix yesterday. Dopplers neg for DVT. Patient likely has an element of right-sided heart failure due to his advanced COPD. Continue lasix at bid one more day. Volume status -2L for this hospitalization.  Consider transitioning to po tomorrow.  Continue TEDS. Encourage elevation of LE but pt prefers to sit on side of bed.  Monitor   4. Tobacco abuse. Counseled extensively on the importance of cessation.   5. Anemia: hx of same. Likely related to chronic disease. Remains stable at baseline. No s/sx of bleeding.   6. History of a fib. Currently in sinus rhythm. Continue outpatient medications.   7.Hypertension. SBP range 135-136. Continue outpatient meds   8. Diabetes. CBG range 137-177. Likely related to steroids. Will continue sliding scale insulin at resistant level.    9. Deconditioning. PT assessed and evaluated pt independent with gait and walker. Pt has made arrangements to go home with son initially and then his niece is coming to care for him .     10. Anxiety: improved this am. Reports relief that discharge plan in place and that he will be going home at discharge.    Code Status: full Family Communication: patient at bedside Disposition Plan: home when ready. Hopefully tomorrow.    Consultants:  none  Procedures:  none  Antibiotics:  levaquin  08/29/12>>>>  HPI/Subjective: Sitting on side of bed. Smiling reports feeling "better,slow but sure".   Objective: Filed Vitals:   08/31/12 0138 08/31/12 0545 08/31/12 0707 08/31/12 0737  BP:  135/59    Pulse:  98    Temp:      TempSrc:      Resp:  20    Height:      Weight:    73.2 kg (161 lb 6 oz)  SpO2: 100% 99% 99%     Intake/Output Summary (Last 24 hours) at 08/31/12 0857 Last data filed at 08/30/12 2300  Gross per 24 hour  Intake    360 ml  Output   2025 ml  Net  -1665 ml   Filed Weights   08/29/12 1920 08/30/12 0429 08/31/12 0737  Weight: 71.9 kg (158 lb 8.2 oz) 72.4 kg (159 lb 9.8 oz) 73.2 kg (161 lb 6 oz)    Exam:   General:  Awake alert NAD  Cardiovascular: RRR No MGR 1-2+ edema LE bilaterally particularly feet. Bilateral TEDS intact  Respiratory: mild increased work of breathing with conversation. Improved air movement bilaterally. Rhonchi bilaterally no wheeze   Abdomen: soft +BS non-tender to palpation  Data Reviewed: Basic Metabolic Panel:  Lab 08/30/12 7846 08/29/12 1034  NA 135 138  K 3.7 3.5  CL 95* 98  CO2 30 32  GLUCOSE 300* 121*  BUN 22 12  CREATININE 0.89 0.90  CALCIUM 8.7 8.7  MG -- --  PHOS -- --   Liver Function Tests: No results found for this basename: AST:5,ALT:5,ALKPHOS:5,BILITOT:5,PROT:5,ALBUMIN:5 in the last 168 hours No results found for  this basename: LIPASE:5,AMYLASE:5 in the last 168 hours No results found for this basename: AMMONIA:5 in the last 168 hours CBC:  Lab 08/30/12 0350 08/29/12 1034  WBC 13.6* 13.6*  NEUTROABS -- 11.5*  HGB 9.3* 9.8*  HCT 27.8* 29.0*  MCV 92.7 92.4  PLT 118* 112*   Cardiac Enzymes:  Lab 08/29/12 1034  CKTOTAL --  CKMB --  CKMBINDEX --  TROPONINI <0.30   BNP (last 3 results)  Basename 08/29/12 1043 08/19/12 0423  PROBNP 555.5* 5740.0*   CBG:  Lab 08/30/12 2038 08/30/12 1703 08/30/12 1342 08/30/12 1126 08/30/12 0707  GLUCAP 177* 137* 180* 212* 257*    No results found  for this or any previous visit (from the past 240 hour(s)).   Studies: US Venous Img Lower Bilateral  08/30/2012  *RADIOLOGY REPORT*  Clinical Data: swelling, rule out DVT;;  BILATERAL LOWER EXTREMITY VENOUS DUPLEX ULTRASOUND  Technique: Gray-scale sonography with compression, as well as color and duplex ultrasound, were performed to evaluate the deep venous system from the level of the common femoral vein through the popliteal and proximal calf veins.  Comparison: None.  Findings:  Normal compressibility and normal Doppler signal within the common femoral, superficial femoral and popliteal veins, down to the proximal calf veins.  No grayscale filling defects to suggest DVT.  IMPRESSION: No evidence of bilateral lower extremity deep vein thrombosis.   Original Report Authenticated By: Charlett Nose, M.D.    Dg Chest Portable 1 View  08/29/2012  *RADIOLOGY REPORT*  Clinical Data: Shortness of breath.  PORTABLE CHEST - 1 VIEW  Comparison: 08/18/2012  Findings: Stable COPD/emphysema.  No edema, infiltrate, pneumothorax or pleural effusion is identified.  Heart size is stable.  IMPRESSION: Stable COPD.   Original Report Authenticated By: Irish Lack, M.D.     Scheduled Meds:   . budesonide-formoterol  2 puff Inhalation BID  . diltiazem  240 mg Oral Daily  . enoxaparin (LOVENOX) injection  40 mg Subcutaneous Q24H  . feeding supplement  237 mL Oral TID BM  . furosemide  20 mg Intravenous BID  . guaiFENesin  1,200 mg Oral BID  . insulin aspart  0-20 Units Subcutaneous TID WC  . insulin aspart  0-5 Units Subcutaneous QHS  . ipratropium  0.5 mg Nebulization Q6H  . levalbuterol  0.63 mg Nebulization Q6H  . levofloxacin  500 mg Oral Daily  . methylPREDNISolone (SOLU-MEDROL) injection  40 mg Intravenous Q6H  . nicotine  7 mg Transdermal Q24H  . Tamsulosin HCl  0.4 mg Oral QPC supper   Continuous Infusions:   Principal Problem:  *Acute-on-chronic respiratory failure Active Problems:  COPD with  exacerbation  DM type 2 (diabetes mellitus, type 2)  HTN (hypertension)  Anxiety  Tobacco abuse  Anemia  Physical deconditioning  Atrial fibrillation  Cor pulmonale    Time spent: 35 minutes    [facility] M  [facility]  If 8PM-8AM, please contact night-coverage at [email or web address], password [facility] 08/31/2012, 8:57 AM  LOS: 2 days      Attending note:  Patient seen and examined. Agree with plan as above. Taper steroids. He is likely approaching baseline resp status. Anxiety playing a large role here. Will continue diuresis with IV lasix. He wishes to return home on discharge. Will need further diuresis prior to discharge.

## 2012-08-31 NOTE — Progress Notes (Signed)
Inpatient Diabetes Program Recommendations  AACE/ADA: New Consensus Statement on Inpatient Glycemic Control (2013)  Target Ranges:  Prepandial:   less than 140 mg/dL      Peak postprandial:   less than 180 mg/dL (1-2 hours)      Critically ill patients:  140 - 180 mg/dL    Inpatient Diabetes Program Recommendations Insulin - Basal: Please consider starting low dose basal insulin.  Patient is on steroids and fasting CBG this morning was 256 mg/dl.  Note:  Fasting blood glucose this morning was 256 mg/dl.  Patient is ordered to receive solumedrol 40 mg Q6H which are likely impacting blood glucose.  Please consider starting a low dose basal insulin while patient is on steroids.  Recommend starting Levemir or Lantus 5 units daily and titrate accordingly to improve glycemic control.  Will continue to follow.  Thanks, Orlando Penner, RN, BSN, CCRN Diabetes Coordinator Inpatient Diabetes Program [phone]

## 2012-09-01 LAB — GLUCOSE, CAPILLARY
Glucose-Capillary: 122 mg/dL — ABNORMAL HIGH (ref 70–99)
Glucose-Capillary: 107 mg/dL — ABNORMAL HIGH (ref 70–99)

## 2012-09-01 MED ORDER — PREDNISONE 20 MG PO TABS: 40.0000 mg | ORAL_TABLET | Freq: Every day | ORAL | Status: DC

## 2012-09-01 NOTE — Progress Notes (Signed)
[facility] PROGRESS NOTE  [name] YNW:[id number] DOB: Dec 09, 1940 DOA: 08/29/2012 PCP: No primary provider on file.  Assessment/Plan: 1.Acute on chronic respiratory failure, likely related to COPD exacerbation. Stable at what appears to be baseline. sats 99-100 on 2L. Continue to taper prednisone.   2. COPD exacerbation. No wheezes. On steroid taper. Continue levaquin day #4.  See problem #1.   3. Pedal edema. Slow improvement with IV lasix yesterday. Dopplers neg for DVT. Patient likely has an element of right-sided heart failure due to his advanced COPD. Continue lasix at bid. Intake and output not accurately recorded.  Continue TEDS. Encourage elevation of LE but pt prefers to sit on side of bed.  Monitor.   Needs continued diuresis.   4. Tobacco abuse. Counseled extensively on the importance of cessation.   5. Anemia: hx of same. Likely related to chronic disease. Remains stable at baseline. No s/sx of bleeding.   6. History of a fib. Currently in sinus rhythm. Continue outpatient medications.   7.Hypertension. stable  8. Diabetes. Blood sugars under fair control   9. Deconditioning. PT assessed and evaluated pt independent with gait and walker. Pt has made arrangements to go home with son initially and then his niece is coming to care for him .     10. Anxiety: improved this am. Reports relief that discharge plan in place and that he will be going home at discharge.    Code Status: full Family Communication: patient at bedside Disposition Plan: home when ready. Anticipate discharge in the next 24-48 hours    Consultants:  none  Procedures:  none  Antibiotics:  levaquin 08/29/12>>>>  HPI/Subjective: Feels nauseous today.  Feels short of breath.  Objective: Filed Vitals:   09/01/12 0440 09/01/12 0725 09/01/12 1401 09/01/12 1450  BP: 141/58   172/69  Pulse: 73   104  Temp: 97.4 F (36.3 C)   98.1 F (36.7 C)  TempSrc: Oral   Oral  Resp: 20   20    Height:      Weight: 75.5 kg (166 lb 7.2 oz)     SpO2: 100% 97% 96% 98%    Intake/Output Summary (Last 24 hours) at 09/01/12 1543 Last data filed at 09/01/12 1221  Gross per 24 hour  Intake    960 ml  Output    851 ml  Net    109 ml   Filed Weights   08/30/12 0429 08/31/12 0737 09/01/12 0440  Weight: 72.4 kg (159 lb 9.8 oz) 73.2 kg (161 lb 6 oz) 75.5 kg (166 lb 7.2 oz)    Exam:   General:  Awake alert NAD  Cardiovascular: RRR No MGR 1-2+ edema LE bilaterally particularly feet. Bilateral TEDS intact  Respiratory: mild increased work of breathing with conversation. Improved air movement bilaterally. Rhonchi bilaterally no wheeze   Abdomen: soft +BS non-tender to palpation  Data Reviewed: Basic Metabolic Panel:  Lab 08/30/12 6578 08/29/12 1034  NA 135 138  K 3.7 3.5  CL 95* 98  CO2 30 32  GLUCOSE 300* 121*  BUN 22 12  CREATININE 0.89 0.90  CALCIUM 8.7 8.7  MG -- --  PHOS -- --   Liver Function Tests: No results found for this basename: AST:5,ALT:5,ALKPHOS:5,BILITOT:5,PROT:5,ALBUMIN:5 in the last 168 hours No results found for this basename: LIPASE:5,AMYLASE:5 in the last 168 hours No results found for this basename: AMMONIA:5 in the last 168 hours CBC:  Lab 08/30/12 0350 08/29/12 1034  WBC 13.6* 13.6*  NEUTROABS -- 11.5*  HGB 9.3* 9.8*  HCT 27.8* 29.0*  MCV 92.7 92.4  PLT 118* 112*   Cardiac Enzymes:  Lab 08/29/12 1034  CKTOTAL --  CKMB --  CKMBINDEX --  TROPONINI <0.30   BNP (last 3 results)  Basename 08/29/12 1043 08/19/12 0423  PROBNP 555.5* 5740.0*   CBG:  Lab 09/01/12 1112 09/01/12 0739 08/31/12 2050 08/31/12 1702 08/31/12 1120  GLUCAP 122* 217* 227* 179* 250*    No results found for this or any previous visit (from the past 240 hour(s)).   Studies: US Venous Img Lower Bilateral  08/30/2012  *RADIOLOGY REPORT*  Clinical Data: swelling, rule out DVT;;  BILATERAL LOWER EXTREMITY VENOUS DUPLEX ULTRASOUND  Technique: Gray-scale  sonography with compression, as well as color and duplex ultrasound, were performed to evaluate the deep venous system from the level of the common femoral vein through the popliteal and proximal calf veins.  Comparison: None.  Findings:  Normal compressibility and normal Doppler signal within the common femoral, superficial femoral and popliteal veins, down to the proximal calf veins.  No grayscale filling defects to suggest DVT.  IMPRESSION: No evidence of bilateral lower extremity deep vein thrombosis.   Original Report Authenticated By: [name], M.D.     Scheduled Meds:    . budesonide-formoterol  2 puff Inhalation BID  . diltiazem  240 mg Oral Daily  . enoxaparin (LOVENOX) injection  40 mg Subcutaneous Q24H  . feeding supplement  237 mL Oral TID BM  . furosemide  20 mg Intravenous BID  . guaiFENesin  1,200 mg Oral BID  . insulin aspart  0-20 Units Subcutaneous TID WC  . insulin aspart  0-5 Units Subcutaneous QHS  . ipratropium  0.5 mg Nebulization Q6H  . levalbuterol  0.63 mg Nebulization Q6H  . levofloxacin  500 mg Oral Daily  . nicotine  7 mg Transdermal Q24H  . predniSONE  60 mg Oral Q breakfast  . Tamsulosin HCl  0.4 mg Oral QPC supper   Continuous Infusions:   Principal Problem:  *Acute-on-chronic respiratory failure Active Problems:  COPD with exacerbation  DM type 2 (diabetes mellitus, type 2)  HTN (hypertension)  Anxiety  Tobacco abuse  Anemia  Physical deconditioning  Atrial fibrillation  Cor pulmonale    Time spent: 35 minutes    [name]  [facility]  If 8PM-8AM, please contact night-coverage at [email or web address], password [facility] 09/01/2012, 3:43 PM  LOS: 3 days

## 2012-09-02 ENCOUNTER — Encounter (HOSPITAL_COMMUNITY): Payer: Self-pay | Admitting: Radiology

## 2012-09-02 ENCOUNTER — Inpatient Hospital Stay (HOSPITAL_COMMUNITY): Payer: Medicare Other

## 2012-09-02 DIAGNOSIS — R112 Nausea with vomiting, unspecified: Secondary | ICD-10-CM | POA: Diagnosis not present

## 2012-09-02 DIAGNOSIS — K56600 Partial intestinal obstruction, unspecified as to cause: Secondary | ICD-10-CM

## 2012-09-02 DIAGNOSIS — K56609 Unspecified intestinal obstruction, unspecified as to partial versus complete obstruction: Secondary | ICD-10-CM

## 2012-09-02 HISTORY — DX: Partial intestinal obstruction, unspecified as to cause: K56.600

## 2012-09-02 LAB — COMPREHENSIVE METABOLIC PANEL
ALT: 51 U/L (ref 0–53)
AST: 27 U/L (ref 0–37)
Albumin: 2.9 g/dL — ABNORMAL LOW (ref 3.5–5.2)
CO2: 37 mEq/L — ABNORMAL HIGH (ref 19–32)
Chloride: 90 mEq/L — ABNORMAL LOW (ref 96–112)
GFR calc non Af Amer: 82 mL/min — ABNORMAL LOW (ref >90)
Sodium: 140 mEq/L (ref 135–145)
Total Bilirubin: 0.3 mg/dL (ref 0.3–1.2)

## 2012-09-02 LAB — GLUCOSE, CAPILLARY
Glucose-Capillary: 121 mg/dL — ABNORMAL HIGH (ref 70–99)
Glucose-Capillary: 118 mg/dL — ABNORMAL HIGH (ref 70–99)
Glucose-Capillary: 108 mg/dL — ABNORMAL HIGH (ref 70–99)

## 2012-09-02 LAB — CBC
MCH: 30.9 pg (ref 26.0–34.0)
MCHC: 32.9 g/dL (ref 30.0–36.0)
MCV: 93.7 fL (ref 78.0–100.0)
Platelets: 157 10*3/uL (ref 150–400)

## 2012-09-02 MED ORDER — POTASSIUM CHLORIDE 10 MEQ/100ML IV SOLN
10.0000 meq | INTRAVENOUS | Status: AC
  Filled 2012-09-02: qty 100

## 2012-09-02 MED ORDER — METOPROLOL TARTRATE 1 MG/ML IV SOLN
5.0000 mg | Freq: Four times a day (QID) | INTRAVENOUS | Status: DC
  Administered 2012-09-02 – 2012-09-03 (×3): 5 mg via INTRAVENOUS
  Filled 2012-09-02 (×2): qty 5

## 2012-09-02 MED ORDER — POTASSIUM CHLORIDE 10 MEQ/100ML IV SOLN
INTRAVENOUS | Status: AC
  Administered 2012-09-02: 10 meq
  Filled 2012-09-02: qty 100

## 2012-09-02 MED ORDER — POTASSIUM CHLORIDE 10 MEQ/100ML IV SOLN
INTRAVENOUS | Status: AC
  Administered 2012-09-03: 10 meq
  Filled 2012-09-02: qty 100

## 2012-09-02 MED ORDER — IOHEXOL 300 MG/ML  SOLN
50.0000 mL | Freq: Once | INTRAMUSCULAR | Status: AC | PRN
  Administered 2012-09-02: 50 mL via ORAL

## 2012-09-02 MED ORDER — POTASSIUM CHLORIDE 10 MEQ/100ML IV SOLN
10.0000 meq | INTRAVENOUS | Status: AC
  Administered 2012-09-02 (×3): 10 meq via INTRAVENOUS
  Filled 2012-09-02: qty 100

## 2012-09-02 MED ORDER — IOHEXOL 300 MG/ML  SOLN
100.0000 mL | Freq: Once | INTRAMUSCULAR | Status: AC | PRN
  Administered 2012-09-02: 100 mL via INTRAVENOUS

## 2012-09-02 MED ORDER — MORPHINE SULFATE 2 MG/ML IJ SOLN
1.0000 mg | INTRAMUSCULAR | Status: DC | PRN
  Administered 2012-09-02 – 2012-09-03 (×3): 1 mg via INTRAVENOUS
  Filled 2012-09-02 (×3): qty 1

## 2012-09-02 MED ORDER — LORAZEPAM 2 MG/ML IJ SOLN
0.5000 mg | Freq: Four times a day (QID) | INTRAMUSCULAR | Status: DC | PRN
  Administered 2012-09-02 – 2012-09-03 (×3): 0.5 mg via INTRAVENOUS
  Filled 2012-09-02 (×3): qty 1

## 2012-09-02 NOTE — Progress Notes (Signed)
Patient continued to have emesis and abdominal distention.  Notified Dr. Rito Ehrlich and received order for lab work and CT scan.  Orders followed accordingly, will monitor patient and inform next nurse of patient condition.

## 2012-09-02 NOTE — Progress Notes (Signed)
[facility] PROGRESS NOTE  [name] NGE:[id number] DOB: 02-06-1941 DOA: 08/29/2012 PCP: No primary provider on file.  Assessment/Plan:  1. Nausea/vomiting due to psbo: 2 episodes during night. CT yields partial small bowel obstruction, no free air. Will make NPO and provide NG tube. Follow serial abdominal exams and check for clinical improvement. Encouraged to ambulate. 2.Acute on chronic respiratory failure, likely related to COPD exacerbation. Stable at what appears to be baseline. sats remain 99-100 on 2L. Will change prednisone to low dose solumedrol given current NPO status.  3. COPD exacerbation. No wheezes. On steroid taper. Continue levaquin day #5. See problem #1.  4. Pedal edema. Improved this am likely because pt has been lying down as well as getting IV lasix.. Dopplers neg for DVT. Patient likely has an element of right-sided heart failure due to his advanced COPD. Continue lasix but change to daily. Intake and output not accurately recorded. Continue TEDS. Needs continued diuresis.  5. Tobacco abuse. Counseled extensively on the importance of cessation.  6. Anemia: hx of same. Likely related to chronic disease. Remains stable at baseline. No s/sx of bleeding.  7. History of a fib. Currently in sinus rhythm. Continue outpatient medications.  8.Hypertension. stable  9. Diabetes. Holding metformin.  Blood sugars under fair control  10. Deconditioning. PT assessed and evaluated pt independent with gait and walker. Pt has made arrangements to go home with son initially and then his niece is coming to care for him .  11. Anxiety: stable at baseline  12. Hypokalemia: likely related to lasix. Will replete IV and recheck in am.   Code Status: No CPR ok short term intubation Family Communication: pt at bedside Disposition Plan: home when medically stable.    Consultants:  none  Procedures:  none  Antibiotics:  levqquin 08/29/12>>>>  HPI/Subjective: Lying in bed  awake alert. States "i dont feel so good". Denies pain. Complains of nausea and "bloating". Also had episodes of vomiting.  Reports that he last moved his bowels yestserday.  He is passing flatus.  Objective: Filed Vitals:   09/02/12 0226 09/02/12 0250 09/02/12 0417 09/02/12 0655  BP: 175/80  180/77   Pulse: 95  95   Temp: 98.1 F (36.7 C)  98.2 F (36.8 C)   TempSrc: Oral  Oral   Resp: 24  24   Height:      Weight:   72.7 kg (160 lb 4.4 oz)   SpO2: 97% 96% 94% 94%    Intake/Output Summary (Last 24 hours) at 09/02/12 1004 Last data filed at 09/02/12 0900  Gross per 24 hour  Intake    120 ml  Output   2003 ml  Net  -1883 ml   Filed Weights   08/31/12 0737 09/01/12 0440 09/02/12 0417  Weight: 73.2 kg (161 lb 6 oz) 75.5 kg (166 lb 7.2 oz) 72.7 kg (160 lb 4.4 oz)    Exam:   General:  Awake alert somewhat ill appearing  Cardiovascular: RRR with intermittent tachycardia. No MGR 1+LEE especially feet.   Respiratory: no increased work of breathing. Fair air movement. No wheeze.   Abdomen: distended, no bowel sounds. No tenderness to palpation.   Data Reviewed: Basic Metabolic Panel:  Lab 09/02/12 4010 08/30/12 0350 08/29/12 1034  NA 140 135 138  K 3.1* 3.7 3.5  CL 90* 95* 98  CO2 37* 30 32  GLUCOSE 139* 300* 121*  BUN 35* 22 12  CREATININE 0.97 0.89 0.90  CALCIUM 8.5 8.7 8.7  MG -- -- --  PHOS -- -- --   Liver Function Tests:  Lab 09/02/12 0518  AST 27  ALT 51  ALKPHOS 61  BILITOT 0.3  PROT 5.8*  ALBUMIN 2.9*    Lab 09/02/12 0518  LIPASE 24  AMYLASE --   No results found for this basename: AMMONIA:5 in the last 168 hours CBC:  Lab 09/02/12 0518 08/30/12 0350 08/29/12 1034  WBC 21.7* 13.6* 13.6*  NEUTROABS -- -- 11.5*  HGB 11.3* 9.3* 9.8*  HCT 34.3* 27.8* 29.0*  MCV 93.7 92.7 92.4  PLT 157 118* 112*   Cardiac Enzymes:  Lab 08/29/12 1034  CKTOTAL --  CKMB --  CKMBINDEX --  TROPONINI <0.30   BNP (last 3 results)  Basename 08/29/12 1043  08/19/12 0423  PROBNP 555.5* 5740.0*   CBG:  Lab 09/02/12 0723 09/01/12 2049 09/01/12 1615 09/01/12 1112 09/01/12 0739  GLUCAP 121* 130* 107* 122* 217*    No results found for this or any previous visit (from the past 240 hour(s)).   Studies: Ct Abdomen Pelvis W Contrast  09/02/2012  *RADIOLOGY REPORT*  Clinical Data: Nausea and vomiting  CT ABDOMEN AND PELVIS WITH CONTRAST  Technique:  Multidetector CT imaging of the abdomen and pelvis was performed following the standard protocol during bolus administration of intravenous contrast.  Contrast: 100 ml Omnipaque 300  Comparison: CT abdomen 04/23/2004  Findings: Lung bases are clear.  No pericardial fluid.  No focal hepatic lesion.  Gallbladder, pancreas, spleen, adrenal glands, and kidneys are normal.  The stomach contains a moderate volume of fluid.  The second portion of the duodenum appears normal.  Pyloric sphincter is prominent (image 26) and slightly nodular.  Beginning in the jejunum there is mildly dilated loops of fluid-filled small bowel. There is evidence of enteric stasis in the more distal small bowel with fecalization of the enteric contents. The distal small bowel is dilated to 4 cm.  There is a caliber change in the distal small bowel as the bowel leading up to the terminal ileum is decompressed measuring approximately 1.5 cm in diameter.  The transition point appears to be within the mid abdomen anteriorly involving the mid ileum and best seen on coronal images 37 through 41.  The ascending colon contains stool.  There is a midline ventral hernia which contains a short loop of transverse colon.  There is stool in the rectum.  No evidence intraperitoneal free air.  There is no pneumatosis.  Abdominal aorta is normal caliber heavily calcified.  There is no free fluid the abdomen pelvis.  The prostate gland bladder normal.  No pelvic lymphadenopathy. Review of  bone windows demonstrates no aggressive osseous lesions.  IMPRESSION:  1.  Partial mechanical small bowel obstruction with transition point in the  mid ileum and likely within the anterior midline peritoneal space as described above. 2.  No evidence of pneumatosis or intraperitoneal free air. 3.  There is stool throughout the colon and rectum.  4.  Ventral midline hernia which stasis short segment transverse colon.  This is made call report.   Original Report Authenticated By: Genevive Bi, M.D.    Dg Abd Acute W/chest  09/02/2012  *RADIOLOGY REPORT*  Clinical Data: Nausea, vomiting.  ACUTE ABDOMEN SERIES (ABDOMEN 2 VIEW & CHEST 1 VIEW)  Comparison: 08/29/2012 chest radiograph  Findings: Similar appearance of the lungs to the prior. Hyperinflation and linear lung base opacities.  No definitive free intraperitoneal air.  The upright radiograph is nondiagnostic.  The supine radiograph shows gaseous distension of  loops of large and small bowel in a nonspecific pattern. Some of the small bowel loops appear mildly distended.  Metallic density projecting over the left pelvis.  IMPRESSION: Limited examination with a nondiagnostic upright view.  There are mildly distended small bowel loops however air is noted within normal caliber colon.  May reflect a partial / early obstruction or ileus.  Consider CT if clinical concern for acute bowel pathology persists.   Original Report Authenticated By: Jearld Lesch, M.D.     Scheduled Meds:   . budesonide-formoterol  2 puff Inhalation BID  . diltiazem  240 mg Oral Daily  . enoxaparin (LOVENOX) injection  40 mg Subcutaneous Q24H  . feeding supplement  237 mL Oral TID BM  . furosemide  20 mg Intravenous BID  . guaiFENesin  1,200 mg Oral BID  . insulin aspart  0-20 Units Subcutaneous TID WC  . insulin aspart  0-5 Units Subcutaneous QHS  . ipratropium  0.5 mg Nebulization Q6H  . levalbuterol  0.63 mg Nebulization Q6H  . levofloxacin  500 mg Oral Daily  . nicotine  7 mg Transdermal Q24H  . predniSONE  40 mg Oral Q breakfast  .  Tamsulosin HCl  0.4 mg Oral QPC supper   Continuous Infusions:   Principal Problem:  *Acute-on-chronic respiratory failure Active Problems:  COPD with exacerbation  DM type 2 (diabetes mellitus, type 2)  HTN (hypertension)  Anxiety  Tobacco abuse  Anemia  Physical deconditioning  Atrial fibrillation  Cor pulmonale  Nausea & vomiting  Partial small bowel obstruction    Time spent: 30 minutes    [facility] M  [facility]  If 8PM-8AM, please contact night-coverage at [email or web address], password [facility] 09/02/2012, 10:04 AM  LOS: 4 days    Attending note:  Patient seen and examined.  Above note reviewed.  Respiratory status is approaching baseline.  His LE edema is improving and can likely transition to po lasix once his bowel issues have resolved.  Imaging from this AM indicates psbo. Patient provided NG tube with suction.  Replace potassium and ambulate frequently.  If patient fails to improve with conservative management, he may need a surgical consult.

## 2012-09-02 NOTE — Progress Notes (Signed)
Patient has had two episodes of vomiting from 7p-3a.  Patient's abdomen is distended, although bowel sounds are present.  Patient has vomited brown colored emesis, approximately 200 cc worth total.  Patient states "he just feels bad".  IV nausea medicine given per MD order and nausea improved slightly.  Will continue to monitor patient and notify Dr. Rito Ehrlich of any changes.

## 2012-09-02 NOTE — Progress Notes (Signed)
UR Chart Review Completed  

## 2012-09-03 ENCOUNTER — Encounter: Payer: Medicare Other | Admitting: Adult Health

## 2012-09-03 DIAGNOSIS — R609 Edema, unspecified: Secondary | ICD-10-CM

## 2012-09-03 DIAGNOSIS — D696 Thrombocytopenia, unspecified: Secondary | ICD-10-CM | POA: Diagnosis not present

## 2012-09-03 DIAGNOSIS — R600 Localized edema: Secondary | ICD-10-CM

## 2012-09-03 HISTORY — DX: Localized edema: R60.0

## 2012-09-03 LAB — GLUCOSE, CAPILLARY
Glucose-Capillary: 85 mg/dL (ref 70–99)
Glucose-Capillary: 274 mg/dL — ABNORMAL HIGH (ref 70–99)

## 2012-09-03 LAB — CBC WITH DIFFERENTIAL/PLATELET
Basophils Absolute: 0 10*3/uL (ref 0.0–0.1)
Basophils Relative: 0 % (ref 0–1)
Lymphocytes Relative: 7 % — ABNORMAL LOW (ref 12–46)
MCHC: 33 g/dL (ref 30.0–36.0)
Monocytes Absolute: 0.1 10*3/uL (ref 0.1–1.0)
Neutro Abs: 9.1 10*3/uL — ABNORMAL HIGH (ref 1.7–7.7)
Neutrophils Relative %: 90 % — ABNORMAL HIGH (ref 43–77)
Platelets: 99 10*3/uL — ABNORMAL LOW (ref 150–400)
RDW: 15.6 % — ABNORMAL HIGH (ref 11.5–15.5)
WBC: 10.1 10*3/uL (ref 4.0–10.5)

## 2012-09-03 LAB — CBC
HCT: 27.6 % — ABNORMAL LOW (ref 39.0–52.0)
Hemoglobin: 9.3 g/dL — ABNORMAL LOW (ref 13.0–17.0)
MCHC: 33.7 g/dL (ref 30.0–36.0)
RBC: 2.95 MIL/uL — ABNORMAL LOW (ref 4.22–5.81)

## 2012-09-03 LAB — BASIC METABOLIC PANEL
BUN: 26 mg/dL — ABNORMAL HIGH (ref 6–23)
Chloride: 97 mEq/L (ref 96–112)
GFR calc Af Amer: >90 mL/min (ref >90)
GFR calc non Af Amer: 86 mL/min — ABNORMAL LOW (ref >90)
Potassium: 2.8 mEq/L — ABNORMAL LOW (ref 3.5–5.1)
Sodium: 140 mEq/L (ref 135–145)

## 2012-09-03 LAB — FERRITIN: Ferritin: 209 ng/mL (ref 22–322)

## 2012-09-03 LAB — MAGNESIUM: Magnesium: 2.2 mg/dL (ref 1.5–2.5)

## 2012-09-03 MED ORDER — POTASSIUM CHLORIDE 10 MEQ/100ML IV SOLN
INTRAVENOUS | Status: AC
  Administered 2012-09-03: 10 meq
  Filled 2012-09-03: qty 100

## 2012-09-03 MED ORDER — DILTIAZEM HCL ER COATED BEADS 180 MG PO CP24
180.0000 mg | ORAL_CAPSULE | Freq: Every day | ORAL | Status: DC
  Administered 2012-09-03 – 2012-09-04 (×2): 180 mg via ORAL
  Filled 2012-09-03 (×2): qty 1

## 2012-09-03 MED ORDER — POTASSIUM CHLORIDE 10 MEQ/100ML IV SOLN
10.0000 meq | INTRAVENOUS | Status: AC
  Administered 2012-09-03 (×4): 10 meq via INTRAVENOUS
  Filled 2012-09-03: qty 400

## 2012-09-03 MED ORDER — POTASSIUM CHLORIDE CRYS ER 20 MEQ PO TBCR
30.0000 meq | EXTENDED_RELEASE_TABLET | Freq: Two times a day (BID) | ORAL | Status: DC
  Administered 2012-09-03 – 2012-09-04 (×3): 30 meq via ORAL
  Filled 2012-09-03 (×4): qty 1

## 2012-09-03 MED ORDER — FUROSEMIDE 10 MG/ML IJ SOLN: 20.0000 mg | Freq: Every day | INTRAMUSCULAR | Status: DC

## 2012-09-03 MED ORDER — METHYLPREDNISOLONE SODIUM SUCC 40 MG IJ SOLR
40.0000 mg | Freq: Every day | INTRAMUSCULAR | Status: DC
  Administered 2012-09-03 – 2012-09-04 (×2): 40 mg via INTRAVENOUS
  Filled 2012-09-03 (×2): qty 1

## 2012-09-03 MED ORDER — FUROSEMIDE 20 MG PO TABS
20.0000 mg | ORAL_TABLET | Freq: Every day | ORAL | Status: DC
  Administered 2012-09-04: 20 mg via ORAL
  Filled 2012-09-03: qty 1

## 2012-09-03 NOTE — Progress Notes (Addendum)
[facility] PROGRESS NOTE  [name] WUJ:[id number] DOB: November 16, 1940 DOA: 08/29/2012 PCP: No primary provider on file.  Assessment/Plan: 1. Partial small bowel obstruction: NG drained to date. Abdomen remains somewhat distended but softer. Pt complaining of hunger. Explained status. Repleting his potassium is important in that it may help with quicker resolution of bowel obstruction. Given his recent bowel movements, we'll clamp the NG tube and advance his diet to full liquids. If he tolerates a full liquids with lunch and dinner, we'll discontinue NG tube.   2.Acute on chronic respiratory failure, likely related to COPD exacerbation. Remains stable at baseline. Sats remain 99-100 on 2L. Continue low dose solumedrol given current NPO status.   3. COPD exacerbation. He has a few rhonchus wheezes. On steroid taper. Continue levaquin day #6. See problem #1.   4. Pedal edema. Some decrease with IV Lasix. . Dopplers neg for DVT. Patient likely has an element of right-sided heart failure due to his advanced COPD, but his 2-D echocardiogram does not reveal pulmonary hypertension or right-sided ventricular hypertrophy or dilatation. The lower extremity edema could be secondary to venous insufficiency. Continue lasix IV but change to daily.  Intake and output not accurately recorded.    5. Tobacco abuse. Counseled extensively on the importance of cessation.   6. Thrombocytopenia and Anemia: hx of same. Likely related to chronic disease. His total iron and TIBC were within normal limits a couple weeks ago. His vitamin B12 level was low normal. No s/sx of bleeding. We'll order another vitamin B12 level and add ferritin.  7. History of a fib. Currently in sinus rhythm. Have transitioned meds to IV BB given NPO status.   8.Hypertension. stable   9. Diabetes. Holding metformin. Blood sugars under fair control   10. Deconditioning. PT assessed and evaluated pt independent with gait and  walker. Has been ambulating.  Pt has made arrangements to go home with son initially and then his niece is coming to care for him .   11. Anxiety: worsening anxiety this am. PRN xanax  12. Hypokalemia: remains low inspite of 6 runs of K+ yesterday. Likely related to lasix and gastric losses. Will check mag and replete.  Lasix dose decreased. Recheck in am.    Code Status: limited Family Communication:  Disposition Plan: home with family when ready   Consultants:  none  Procedures:  none  Antibiotics:  levaquin 08/29/12>>>>  HPI/Subjective: Lying in bed awake alert. Complains of being hungry. States, "I need some food or I will go crazy".  Also complains "this PTSD is getting to me bad".   Objective: Filed Vitals:   09/02/12 2055 09/03/12 0105 09/03/12 0416 09/03/12 0725  BP: 132/62  139/53   Pulse: 91  82   Temp: 97.7 F (36.5 C)  97.8 F (36.6 C)   TempSrc: Oral  Oral   Resp: 20  20   Height:      Weight:   70.4 kg (155 lb 3.3 oz)   SpO2: 98% 97% 96% 91%    Intake/Output Summary (Last 24 hours) at 09/03/12 0924 Last data filed at 09/03/12 0600  Gross per 24 hour  Intake      0 ml  Output    500 ml  Net   -500 ml   Filed Weights   09/01/12 0440 09/02/12 0417 09/03/12 0416  Weight: 75.5 kg (166 lb 7.2 oz) 72.7 kg (160 lb 4.4 oz) 70.4 kg (155 lb 3.3 oz)    Exam:  General:  Awake, alert face flushed appears anxious  Cardiovascular: RRR No MGR 1+LEE mostly of feet  Respiratory: normal effort. Mild rhonchus wheezing bilaterally.  Abdomen: Mildly distended, but softer than yesterday. +BS non-tender NG tube in tact with green liquid drainage.   Data Reviewed: Basic Metabolic Panel:  Lab 09/03/12 1610 09/02/12 0518 08/30/12 0350 08/29/12 1034  NA 140 140 135 138  K 2.8* 3.1* 3.7 3.5  CL 97 90* 95* 98  CO2 35* 37* 30 32  GLUCOSE 94 139* 300* 121*  BUN 26* 35* 22 12  CREATININE 0.86 0.97 0.89 0.90  CALCIUM 7.6* 8.5 8.7 8.7  MG -- -- -- --  PHOS -- --  -- --   Liver Function Tests:  Lab 09/02/12 0518  AST 27  ALT 51  ALKPHOS 61  BILITOT 0.3  PROT 5.8*  ALBUMIN 2.9*    Lab 09/02/12 0518  LIPASE 24  AMYLASE --   No results found for this basename: AMMONIA:5 in the last 168 hours CBC:  Lab 09/03/12 0354 09/02/12 0518 08/30/12 0350 08/29/12 1034  WBC 7.9 21.7* 13.6* 13.6*  NEUTROABS -- -- -- 11.5*  HGB 9.3* 11.3* 9.3* 9.8*  HCT 27.6* 34.3* 27.8* 29.0*  MCV 93.6 93.7 92.7 92.4  PLT 92* 157 118* 112*   Cardiac Enzymes:  Lab 08/29/12 1034  CKTOTAL --  CKMB --  CKMBINDEX --  TROPONINI <0.30   BNP (last 3 results)  Basename 08/29/12 1043 08/19/12 0423  PROBNP 555.5* 5740.0*   CBG:  Lab 09/03/12 0722 09/02/12 2032 09/02/12 1622 09/02/12 1136 09/02/12 0723  GLUCAP 85 108* 100* 118* 121*    No results found for this or any previous visit (from the past 240 hour(s)).   Studies: Ct Abdomen Pelvis W Contrast  09/02/2012  *RADIOLOGY REPORT*  Clinical Data: Nausea and vomiting  CT ABDOMEN AND PELVIS WITH CONTRAST  Technique:  Multidetector CT imaging of the abdomen and pelvis was performed following the standard protocol during bolus administration of intravenous contrast.  Contrast: 100 ml Omnipaque 300  Comparison: CT abdomen 04/23/2004  Findings: Lung bases are clear.  No pericardial fluid.  No focal hepatic lesion.  Gallbladder, pancreas, spleen, adrenal glands, and kidneys are normal.  The stomach contains a moderate volume of fluid.  The second portion of the duodenum appears normal.  Pyloric sphincter is prominent (image 26) and slightly nodular.  Beginning in the jejunum there is mildly dilated loops of fluid-filled small bowel. There is evidence of enteric stasis in the more distal small bowel with fecalization of the enteric contents. The distal small bowel is dilated to 4 cm.  There is a caliber change in the distal small bowel as the bowel leading up to the terminal ileum is decompressed measuring approximately 1.5 cm  in diameter.  The transition point appears to be within the mid abdomen anteriorly involving the mid ileum and best seen on coronal images 37 through 41.  The ascending colon contains stool.  There is a midline ventral hernia which contains a short loop of transverse colon.  There is stool in the rectum.  No evidence intraperitoneal free air.  There is no pneumatosis.  Abdominal aorta is normal caliber heavily calcified.  There is no free fluid the abdomen pelvis.  The prostate gland bladder normal.  No pelvic lymphadenopathy. Review of  bone windows demonstrates no aggressive osseous lesions.  IMPRESSION:  1. Partial mechanical small bowel obstruction with transition point in the  mid ileum and likely within  the anterior midline peritoneal space as described above. 2.  No evidence of pneumatosis or intraperitoneal free air. 3.  There is stool throughout the colon and rectum.  4.  Ventral midline hernia which stasis short segment transverse colon.  This is made call report.   Original Report Authenticated By: Genevive Bi, M.D.    Dg Abd Acute W/chest  09/02/2012  *RADIOLOGY REPORT*  Clinical Data: Nausea, vomiting.  ACUTE ABDOMEN SERIES (ABDOMEN 2 VIEW & CHEST 1 VIEW)  Comparison: 08/29/2012 chest radiograph  Findings: Similar appearance of the lungs to the prior. Hyperinflation and linear lung base opacities.  No definitive free intraperitoneal air.  The upright radiograph is nondiagnostic.  The supine radiograph shows gaseous distension of loops of large and small bowel in a nonspecific pattern. Some of the small bowel loops appear mildly distended.  Metallic density projecting over the left pelvis.  IMPRESSION: Limited examination with a nondiagnostic upright view.  There are mildly distended small bowel loops however air is noted within normal caliber colon.  May reflect a partial / early obstruction or ileus.  Consider CT if clinical concern for acute bowel pathology persists.   Original Report  Authenticated By: Jearld Lesch, M.D.     Scheduled Meds:   . budesonide-formoterol  2 puff Inhalation BID  . enoxaparin (LOVENOX) injection  40 mg Subcutaneous Q24H  . feeding supplement  237 mL Oral TID BM  . furosemide  20 mg Intravenous Daily  . insulin aspart  0-20 Units Subcutaneous TID WC  . insulin aspart  0-5 Units Subcutaneous QHS  . ipratropium  0.5 mg Nebulization Q6H  . levalbuterol  0.63 mg Nebulization Q6H  . metoprolol  5 mg Intravenous Q6H  . nicotine  7 mg Transdermal Q24H  . potassium chloride  10 mEq Intravenous Q1 Hr x 4   Continuous Infusions:   Principal Problem:  *Acute-on-chronic respiratory failure Active Problems:  COPD with exacerbation  DM type 2 (diabetes mellitus, type 2)  HTN (hypertension)  Anxiety  Tobacco abuse  Anemia  Physical deconditioning  Atrial fibrillation  Cor pulmonale  Nausea & vomiting  Partial small bowel obstruction    Time spent: 30 minutes    [facility] M  [facility]  If 8PM-8AM, please contact night-coverage at [email or web address], password [email or web address] 09/03/2012, 9:24 AM  LOS: 5 days    The patient was seen and examined by me. He was discussed with nurse practitioner, Ms. Vedia Coffer. The above note has been amended and annotated. The patient has had a significant decrease in his white blood cell count and platelet count. The followup CBC reveals no significant changes from the CBC ordered earlier this morning. In looking back at his previous blood work, he did have a platelet count in the 110-120 range before. He was on Lovenox for DVT prophylaxis, but it has been discontinued for now. His total iron and TIBC were normal 2 weeks ago. His vitamin B12 level was low normal 2 weeks ago. His TSH was within normal limits in November 2012. I have asked the lab to review his CBC smear. He may need a referral to hematology. A followup vitamin B12 level will be ordered along with a ferritin level.  The patient's blood magnesium level  is within normal limits. Will start potassium chloride orally as tolerated in addition to the IV potassium runs. As above, his NG tube will be clamped and his diet will be advanced as tolerated. We'll discontinue the NG tube if he tolerates full liquids.  Elliot Cousin, MD.

## 2012-09-03 NOTE — Progress Notes (Addendum)
Patient had two bowel movements last night.  Patient had moderate amount of stool ( hard circular shaped stools) in bed side commode.  Patient had some relief from using the bathroom.  Total output from ng tube from insertion yesterday am to this am is approximately 400cc.  Patient has had no complaints of nausea on night shift. Abdomen still distended but slightly softer.  Will continue to monitor patient.

## 2012-09-03 NOTE — Progress Notes (Signed)
Pt had a large BM this afternoon (large circular stools).  Pt states feels much better.  Ate a full liquid lunch and has no n/v or pain.  Reported to Dr. Sherrie Mustache.

## 2012-09-03 NOTE — Progress Notes (Signed)
Pt ate evening meal without difficulty.  No c/o n/v or pain.  Dr. Sherrie Mustache notified.  Gave order to d/c NG tube.  Orders followed.

## 2012-09-04 ENCOUNTER — Encounter (HOSPITAL_COMMUNITY): Payer: Self-pay | Admitting: Internal Medicine

## 2012-09-04 DIAGNOSIS — I480 Paroxysmal atrial fibrillation: Secondary | ICD-10-CM | POA: Diagnosis present

## 2012-09-04 LAB — DIFFERENTIAL
Basophils Relative: 0 % (ref 0–1)
Eosinophils Absolute: 0.2 10*3/uL (ref 0.0–0.7)
Eosinophils Relative: 2 % (ref 0–5)
Monocytes Absolute: 0.1 10*3/uL (ref 0.1–1.0)
Monocytes Relative: 1 % — ABNORMAL LOW (ref 3–12)

## 2012-09-04 LAB — BASIC METABOLIC PANEL
BUN: 20 mg/dL (ref 6–23)
Chloride: 99 mEq/L (ref 96–112)
GFR calc non Af Amer: >90 mL/min (ref >90)
Glucose, Bld: 182 mg/dL — ABNORMAL HIGH (ref 70–99)
Potassium: 4 mEq/L (ref 3.5–5.1)

## 2012-09-04 LAB — CBC
HCT: 28.9 % — ABNORMAL LOW (ref 39.0–52.0)
Hemoglobin: 9.7 g/dL — ABNORMAL LOW (ref 13.0–17.0)
MCH: 31.6 pg (ref 26.0–34.0)
MCHC: 33.6 g/dL (ref 30.0–36.0)

## 2012-09-04 MED ORDER — HYDROCODONE-ACETAMINOPHEN 5-325 MG PO TABS: 1.0000 | ORAL_TABLET | ORAL | Status: DC | PRN

## 2012-09-04 MED ORDER — POTASSIUM CHLORIDE CRYS ER 20 MEQ PO TBCR: 20.0000 meq | EXTENDED_RELEASE_TABLET | Freq: Every day | ORAL | Status: DC

## 2012-09-04 MED ORDER — ALBUTEROL SULFATE (2.5 MG/3ML) 0.083% IN NEBU: 2.5000 mg | INHALATION_SOLUTION | Freq: Four times a day (QID) | RESPIRATORY_TRACT | Status: DC

## 2012-09-04 MED ORDER — FUROSEMIDE 20 MG PO TABS: 20.0000 mg | ORAL_TABLET | Freq: Every day | ORAL | Status: DC

## 2012-09-04 MED ORDER — DILTIAZEM HCL ER COATED BEADS 180 MG PO CP24: 180.0000 mg | ORAL_CAPSULE | Freq: Every day | ORAL | Status: DC

## 2012-09-04 MED ORDER — BUDESONIDE-FORMOTEROL FUMARATE 160-4.5 MCG/ACT IN AERO: 2.0000 | INHALATION_SPRAY | Freq: Two times a day (BID) | RESPIRATORY_TRACT | Status: DC

## 2012-09-04 MED ORDER — BENZONATATE 200 MG PO CAPS: 200.0000 mg | ORAL_CAPSULE | Freq: Three times a day (TID) | ORAL | Status: DC | PRN

## 2012-09-04 MED ORDER — ALPRAZOLAM 0.25 MG PO TABS: 0.2500 mg | ORAL_TABLET | Freq: Three times a day (TID) | ORAL | Status: DC | PRN

## 2012-09-04 MED ORDER — TRAZODONE 25 MG HALF TABLET: 25.0000 mg | ORAL_TABLET | Freq: Every evening | ORAL | Status: DC | PRN

## 2012-09-04 MED ORDER — GUAIFENESIN 100 MG/5ML PO SOLN
5.0000 mL | ORAL | Status: DC | PRN
  Administered 2012-09-04: 100 mg via ORAL
  Filled 2012-09-04: qty 5

## 2012-09-04 MED ORDER — PREDNISONE 10 MG PO TABS: ORAL_TABLET | ORAL | Status: DC

## 2012-09-04 MED ORDER — SENNA-DOCUSATE SODIUM 8.6-50 MG PO TABS: 1.0000 | ORAL_TABLET | Freq: Every day | ORAL | Status: DC

## 2012-09-04 NOTE — Progress Notes (Signed)
Patient was coughing a lot and wanted to know if he could have something for cough. Doctor was notified and new orders were given.

## 2012-09-04 NOTE — Plan of Care (Signed)
Problem: Discharge Progression Outcomes Goal: Independent ADLs or Home Health Care Outcome: Completed/Met Date Met:  09/04/12 09/04/12 1519 [facility] health for RN with [facility] to resume at discharge per Dr Sherrie Mustache. Notified [facility], spoke with chasity. Stated orders in place and they will resume home health for patient. Pt given [facility] phone number to contact if any questions/concerns. Pt stated understood. Earnstine Regal, RN

## 2012-09-04 NOTE — Discharge Summary (Signed)
Physician Discharge Summary  [name] YQM:[id number] DOB: 1941/02/17 DOA: 08/29/2012  PCP: Redmond Baseman, MD  Admit date: 08/29/2012 Discharge date: 09/04/2012  Time spent: Greater than 30 minutes  Recommendations for Outpatient Follow-up:  1. The patient was instructed to call his primary care provider and cardiologist for hospital followup in one to 2 weeks. A consideration can be made to stop diltiazem if it is clearly decided that it is the only cause of his lower extremity edema. 2. He was encouraged to stop smoking completely. 3. The patient was discharged on chronic prednisone therapy for severe COPD. 4. The patient's platelet count and hemoglobin should be monitored in the outpatient setting.  Discharge Diagnoses:   1. Acute on chronic respiratory failure secondary to COPD with exacerbation. 2. Oxygen-dependent COPD with exacerbation. 3. Ongoing tobacco abuse. The patient was strongly advised to stop smoking once again. 4. Partial small bowel obstruction. Resolved nonsurgically. 5. Bilateral lower extremity edema. Possibly secondary to calcium channel blocker. Right-sided heart failure or cor pulmonale was considered, but the patient's 2-D echocardiogram results from 08/19/2012 was not suggestive of pulmonary hypertension or right ventricular dilatation. 6. Thrombocytopenia. Etiology unknown, but the patient continues to have periodic thrombocytopenia during the hospitalizations. Vitamin B12 level and TSH were both within normal limits. 7. Type 2 diabetes mellitus. Stable. 8. Paroxysmal atrial fibrillation. Stable. Not a Coumadin candidate due to history of noncompliance and poor followup. 9. Hypertension. Stable. 10. Chronic anxiety, benzodiazepine dependent. 11. Chronic pain syndrome. 12. Chronic physical deconditioning secondary to severe COPD. 13. Anemia of chronic disease.  Discharge Condition: Improved, but chronically ill.  Diet recommendation: Heart  healthy/carbohydrate modified.  Filed Weights   09/02/12 0417 09/03/12 0416 09/04/12 0502  Weight: 72.7 kg (160 lb 4.4 oz) 70.4 kg (155 lb 3.3 oz) 75.5 kg (166 lb 7.2 oz)    History of present illness:  The patient is a 72 year old man with a history significant for oxygen-dependent COPD who was recently discharged from the hospital for treatment of COPD exacerbation and newly diagnosed paroxysmal atrial fibrillation. He was discharged to home in improved condition, but later reported that he was unable to obtain any of his medications because the prescriptions apparently were not signed. He returned to the emergency Department 08/29/2012 with a chief complaint of shortness of breath, cough, chest congestion, and bilateral lower extremity leg pain and swelling. In the emergency department, he was afebrile and hemodynamically stable. His chest x-ray revealed stable COPD. His lab data were significant for WBC of 13.6, hemoglobin of 9.8, platelet count of 112, and glucose of 121. He was admitted for further evaluation and management.  Hospital Course:   1. Acute on chronic hypoxic respiratory failure secondary to COPD with exacerbation. The patient was restarted on low-dose IV Solu-Medrol. Oral Levaquin was continued from the previous hospitalization. The course was completed during hospitalization. Oxygen was continued at 2-3 L per minute. Xopenex and Atrovent nebulizers were given every 4 hours and every 2 hours as needed. A nicotine patch was placed. Once again, he was strongly advised to stop smoking. Over the course of the hospitalization, the patient's COPD with exacerbation subsided. Solu-Medrol was titrated down. He was discharged on a prednisone taper and then instructed to take 5 mg of prednisone daily thereafter. He continues to have chronic baseline pulmonary crackles and occasional wheezes which appear to never completely resolve per my history with the patient.  2. Bilateral lower extremity  edema. The patient complained of swelling in his legs which were  somewhat painful. He was continued on Lasix, but it was given Lasix intravenously. It was felt that he may have an element of right heart failure or cor pulmonale, secondary to his severe COPD. However, the 2-D echocardiogram from what a half weeks prior, did not reveal pulmonary hypertension, right ventricular hypertrophy, or right ventricular dilatation. He was started on diltiazem during the previous hospitalization for treatment of paroxysmal atrial fibrillation with rapid ventricular rate. Diltiazem and/or lower extremity venous insufficiency could be the culprits. Lower extremity venous ultrasound was ordered to rule out DVT and it was negative for DVT. After several days of IV Lasix, the edema subsided only a little. Therefore, TED hose were ordered. The patient was instructed to keep his legs elevated while sitting or while supine. He was not very compliant with these recommendations. Discontinuing diltiazem altogether was contemplated, however, metoprolol would not be an ideal medication for rate control in this patient with severe COPD, neither with amiodarone.  3. Paroxysmal atrial fibrillation. The patient developed rapid atrial fibrillation during the previous hospitalization. His rhythm has since returned to normal sinus rhythm. He was deemed to be not a candidate for Coumadin therapy secondary to his noncompliance. As stated above, he was maintained on diltiazem for rate control. He remained in normal sinus rhythm throughout the hospitalization. If he remains in normal sinus rhythm for the next few weeks, a consideration can be made to discontinue diltiazem, particularly, if it is clearly the only cause of lower extremity edema.  4. Partial small bowel obstruction.  The patient developed abdominal pain, nausea, and vomiting. Abdominal x-ray revealed dilated small bowel loops. CT of his abdomen and pelvis revealed partial  mechanical small bowel obstruction and stool throughout the colon and rectum. The patient was treated with NG tube decompression, IV analgesics, and IV antiemetics. Subsequently, he began having bowel movements. Clinically, the obstruction resolved. His diet was advanced which he tolerated well. He was discharged on daily laxative therapy.  5. Anemia and thrombocytopenia. The patient has a history of anemia, thought to be secondary to chronic disease. His total iron and TIBC were within normal limits a couple of weeks ago. During this hospitalization, both his ferritin and vitamin B12 levels were within normal limits. There was no sign of GI or GU bleeding. His platelet count was noted to be intermittently low. Both his vitamin B12 level and TSH were within normal limits. His CBC should be continued to be monitored in the outpatient setting.   6. Hypokalemia. Following the initiation of Lasix, his serum potassium fell to a nadir of 2.8. He was repleted with potassium chloride in his IV fluids and subsequently orally. His magnesium level was within normal limits. His serum potassium normalized. He was discharged on daily dosing of potassium.  7. Chronic debilitation/deconditioning. Home health services were resumed upon discharge.   Procedures: 2-D echocardiogram: 08/19/2012:Study Conclusions  - Left ventricle: The cavity size was normal. There was mild to moderate concentric hypertrophy. Systolic function was normal. The estimated ejection fraction was in the range of 55% to 60%. Wall motion was normal; there were no regional wall motion abnormalities. - Aortic valve: Mildly calcified annulus. Trileaflet. - Mitral valve: Calcified annulus. - Left atrium: The atrium was mildly dilated. - Right atrium: The atrium was mildly dilated. - Atrial septum: No defect or patent foramen ovale was identified. Impressions:  - Compared to the prior study performed 12/28/07, some interval LA and RA  enlargement. Transthoracic echocardiography. M-mode, complete 2D, spectral Doppler, and color  Doppler. Height: Height: 160cm. Height: 63in. Weight: Weight: 65.8kg. Weight: 144.7lb. Body mass index: BMI: 25.7kg/m^2. Body surface area: BSA: 1.76m^2. Patient status: Inpatient. Location: ICU/CCU     Consultations:  None   Discharge Exam: Filed Vitals:   09/03/12 2041 09/04/12 0247 09/04/12 0502 09/04/12 0721  BP: 153/57  123/56   Pulse: 92  86   Temp: 98.4 F (36.9 C)  97.8 F (36.6 C)   TempSrc: Tympanic  Oral   Resp: 20  20   Height:      Weight:   75.5 kg (166 lb 7.2 oz)   SpO2: 99% 97% 99% 97%    General: Small framed chronically debilitated 72 year old Caucasian man sitting up on the side of the bed, in no acute distress.  Cardiovascular: S1, S2, with no murmurs rubs or gallops.  Respiratory: Few scattered wheezes. Extremities: Trace of pretibial edema and 1+ to 2+ pedal edema, nonpitting.   Discharge Instructions  Discharge Orders    Future Orders Please Complete By Expires   Diet - low sodium heart healthy      Diet Carb Modified      Increase activity slowly      Discharge instructions      Comments:   Do not smoke. Take new medications as ordered. Increase her albuterol nebulizer to 4 times daily. Keep your legs elevated while sitting or lying down. Followup with your primary care physician in one week or less.       Medication List     As of 09/04/2012 11:50 AM    TAKE these medications         ALPRAZolam 0.25 MG tablet   Commonly known as: XANAX   Take 1 tablet (0.25 mg total) by mouth 3 (three) times daily as needed for anxiety (air hunger, dyspnea).      alum & mag hydroxide-simeth 200-200-20 MG/5ML suspension   Commonly known as: MAALOX/MYLANTA   Take 30 mLs by mouth as needed.      benzonatate 200 MG capsule   Commonly known as: TESSALON   Take 1 capsule (200 mg total) by mouth 3 (three) times daily as needed for cough.       budesonide-formoterol 160-4.5 MCG/ACT inhaler   Commonly known as: SYMBICORT   Inhale 2 puffs into the lungs 2 (two) times daily.      diltiazem 180 MG 24 hr capsule   Commonly known as: CARDIZEM CD   Take 1 capsule (180 mg total) by mouth daily. For control of your heart rate.      furosemide 20 MG tablet   Commonly known as: LASIX   Take 1 tablet (20 mg total) by mouth daily. For swelling in your feet and legs. Take potassium supplement with this medicine.      guaiFENesin 600 MG 12 hr tablet   Commonly known as: MUCINEX   Take 2 tablets (1,200 mg total) by mouth 2 (two) times daily.      HYDROcodone-acetaminophen 5-325 MG per tablet   Commonly known as: NORCO/VICODIN   Take 1 tablet by mouth every 4 (four) hours as needed.      ipratropium 0.02 % nebulizer solution   Commonly known as: ATROVENT   Take 500 mcg by nebulization 2 (two) times daily as needed. For chest congestion      metFORMIN 1000 MG tablet   Commonly known as: GLUCOPHAGE   Take 1,000 mg by mouth 2 (two) times daily.      nicotine 7 mg/24hr patch  Commonly known as: NICODERM CQ - dosed in mg/24 hr   Place 1 patch onto the skin daily.      potassium chloride SA 20 MEQ tablet   Commonly known as: K-DUR,KLOR-CON   Take 1 tablet (20 mEq total) by mouth daily. Take potassium supplement with furosemide (Lasix).      predniSONE 10 MG tablet   Commonly known as: DELTASONE   Starting tomorrow, take 5 tablets daily for one day; then 4 tablets daily for one day; then 3 tablets daily for one day; then 2 tablets daily for one day; then 1 tablet daily for one day; then a half a tablet daily thereafter.      albuterol (2.5 MG/3ML) 0.083% nebulizer solution   Commonly known as: PROVENTIL   Take 3 mLs (2.5 mg total) by nebulization 4 (four) times daily.      PROVENTIL HFA 108 (90 BASE) MCG/ACT inhaler   Generic drug: albuterol   Inhale 2 puffs into the lungs 4 (four) times daily as needed. For shortness of breath/ COPD       sennosides-docusate sodium 8.6-50 MG tablet   Commonly known as: SENOKOT-S   Take 1-2 tablets by mouth daily. Take daily to avoid constipation.      sorbitol 70 % Soln   Take 30 mLs by mouth daily as needed.      Tamsulosin HCl 0.4 MG Caps   Commonly known as: FLOMAX   Take 1 capsule (0.4 mg total) by mouth daily after supper.      tiotropium 18 MCG inhalation capsule   Commonly known as: SPIRIVA   Place 18 mcg into inhaler and inhale daily.      traZODone 25 mg Tabs   Commonly known as: DESYREL   Take 0.5 tablets (25 mg total) by mouth at bedtime as needed (insomnia).           Follow-up Information    Follow up with Redmond Baseman, MD. Schedule an appointment as soon as possible for a visit in 1 week. (Followup in 1 week or less.)    Contact information:   [address] [place] [address] [phone]           The results of significant diagnostics from this hospitalization (including imaging, microbiology, ancillary and laboratory) are listed below for reference.    Significant Diagnostic Studies: Ct Abdomen Pelvis W Contrast  09/02/2012  *RADIOLOGY REPORT*  Clinical Data: Nausea and vomiting  CT ABDOMEN AND PELVIS WITH CONTRAST  Technique:  Multidetector CT imaging of the abdomen and pelvis was performed following the standard protocol during bolus administration of intravenous contrast.  Contrast: 100 ml Omnipaque 300  Comparison: CT abdomen 04/23/2004  Findings: Lung bases are clear.  No pericardial fluid.  No focal hepatic lesion.  Gallbladder, pancreas, spleen, adrenal glands, and kidneys are normal.  The stomach contains a moderate volume of fluid.  The second portion of the duodenum appears normal.  Pyloric sphincter is prominent (image 26) and slightly nodular.  Beginning in the jejunum there is mildly dilated loops of fluid-filled small bowel. There is evidence of enteric stasis in the more distal small bowel with  fecalization of the enteric contents. The distal small bowel is dilated to 4 cm.  There is a caliber change in the distal small bowel as the bowel leading up to the terminal ileum is decompressed measuring approximately 1.5 cm in diameter.  The transition point appears to be within the mid  abdomen anteriorly involving the mid ileum and best seen on coronal images 37 through 41.  The ascending colon contains stool.  There is a midline ventral hernia which contains a short loop of transverse colon.  There is stool in the rectum.  No evidence intraperitoneal free air.  There is no pneumatosis.  Abdominal aorta is normal caliber heavily calcified.  There is no free fluid the abdomen pelvis.  The prostate gland bladder normal.  No pelvic lymphadenopathy. Review of  bone windows demonstrates no aggressive osseous lesions.  IMPRESSION:  1. Partial mechanical small bowel obstruction with transition point in the  mid ileum and likely within the anterior midline peritoneal space as described above. 2.  No evidence of pneumatosis or intraperitoneal free air. 3.  There is stool throughout the colon and rectum.  4.  Ventral midline hernia which stasis short segment transverse colon.  This is made call report.   Original Report Authenticated By: Genevive Bi, M.D.    US Venous Img Lower Bilateral  08/30/2012  *RADIOLOGY REPORT*  Clinical Data: swelling, rule out DVT;;  BILATERAL LOWER EXTREMITY VENOUS DUPLEX ULTRASOUND  Technique: Gray-scale sonography with compression, as well as color and duplex ultrasound, were performed to evaluate the deep venous system from the level of the common femoral vein through the popliteal and proximal calf veins.  Comparison: None.  Findings:  Normal compressibility and normal Doppler signal within the common femoral, superficial femoral and popliteal veins, down to the proximal calf veins.  No grayscale filling defects to suggest DVT.  IMPRESSION: No evidence of bilateral lower extremity  deep vein thrombosis.   Original Report Authenticated By: Charlett Nose, M.D.    US Venous Img Upper Uni Right  08/24/2012  *RADIOLOGY REPORT*  Clinical Data: Arm swelling  RIGHT UPPER EXTREMITY VENOUS ULTRASOUND  Technique:  Real-time and Doppler interrogation of the right upper extremity venous system was performed.  Findings:  Thrombus is seen in the mid cephalic vein above the elbow, a superficial venous structure.  This thrombus appears acute.  In the deep venous system of the right upper extremity, flow is spontaneous and phasic in all segments.  Areas that can be compressed and augmented in the deep venous system of the right lower upper extremity show normal compression and augmentation. Venous Doppler signal is normal in all regions except for the thrombus in the mid right cephalic vein.  In particular, there is no deep venous thrombosis in the right upper extremity.  There is diffuse right upper extremity edema.    IMPRESSION: There is acute thrombus in the mid right cephalic vein above the elbow, a superficial venous structure.  There is no deep venous thrombosis in the right upper extremity.  There is diffuse right upper extremity edema.   Original Report Authenticated By: Bretta Bang, M.D.    Dg Chest Portable 1 View  08/29/2012  *RADIOLOGY REPORT*  Clinical Data: Shortness of breath.  PORTABLE CHEST - 1 VIEW  Comparison: 08/18/2012  Findings: Stable COPD/emphysema.  No edema, infiltrate, pneumothorax or pleural effusion is identified.  Heart size is stable.  IMPRESSION: Stable COPD.   Original Report Authenticated By: Irish Lack, M.D.    Dg Chest Port 1 View  08/18/2012  *RADIOLOGY REPORT*  Clinical Data: Chest pain.  COPD.  PORTABLE CHEST - 1 VIEW  Comparison: One-view chest to 08/16/2012.  Findings: The heart size is normal.  There is slight increase in pulmonary vascular congestion since the prior exam.  Minimal bibasilar atelectasis is evident.  The upper lung fields are clear.  Emphysematous changes are again noted.  IMPRESSION:  1.  Slight increase in pulmonary vascular congestion and mild bibasilar atelectasis. 2.  Emphysema.   Original Report Authenticated By: Marin Roberts, M.D.    Dg Chest Port 1 View  08/16/2012  *RADIOLOGY REPORT*  Clinical Data: Chest pain, shortness of breath.  PORTABLE CHEST - 1 VIEW  Comparison: 06/15/2012  Findings: Somewhat prominent perihilar and bibasilar interstitial markings as before.  No confluent airspace infiltrate or overt edema.  No effusion.  Atheromatous aorta.  Heart size normal.  IMPRESSION:  1.  Stable chest   Original Report Authenticated By: D. Andria Rhein, MD    Dg Abd Acute W/chest  09/02/2012  *RADIOLOGY REPORT*  Clinical Data: Nausea, vomiting.  ACUTE ABDOMEN SERIES (ABDOMEN 2 VIEW & CHEST 1 VIEW)  Comparison: 08/29/2012 chest radiograph  Findings: Similar appearance of the lungs to the prior. Hyperinflation and linear lung base opacities.  No definitive free intraperitoneal air.  The upright radiograph is nondiagnostic.  The supine radiograph shows gaseous distension of loops of large and small bowel in a nonspecific pattern. Some of the small bowel loops appear mildly distended.  Metallic density projecting over the left pelvis.  IMPRESSION: Limited examination with a nondiagnostic upright view.  There are mildly distended small bowel loops however air is noted within normal caliber colon.  May reflect a partial / early obstruction or ileus.  Consider CT if clinical concern for acute bowel pathology persists.   Original Report Authenticated By: Jearld Lesch, M.D.     Microbiology: No results found for this or any previous visit (from the past 240 hour(s)).   Labs: Basic Metabolic Panel:  Lab 09/04/12 1191 09/03/12 0942 09/03/12 0354 09/02/12 0518 08/30/12 0350 08/29/12 1034  NA 137 -- 140 140 135 138  K 4.0 -- 2.8* 3.1* 3.7 3.5  CL 99 -- 97 90* 95* 98  CO2 31 -- 35* 37* 30 32  GLUCOSE 182* -- 94 139* 300*  121*  BUN 20 -- 26* 35* 22 12  CREATININE 0.75 -- 0.86 0.97 0.89 0.90  CALCIUM 8.3* -- 7.6* 8.5 8.7 8.7  MG -- 2.2 -- -- -- --  PHOS -- -- -- -- -- --   Liver Function Tests:  Lab 09/02/12 0518  AST 27  ALT 51  ALKPHOS 61  BILITOT 0.3  PROT 5.8*  ALBUMIN 2.9*    Lab 09/02/12 0518  LIPASE 24  AMYLASE --   No results found for this basename: AMMONIA:5 in the last 168 hours CBC:  Lab 09/04/12 0557 09/03/12 1033 09/03/12 0354 09/02/12 0518 08/30/12 0350 08/29/12 1034  WBC 12.0* 10.1 7.9 21.7* 13.6* --  NEUTROABS 10.9* 9.1* -- -- -- 11.5*  HGB 9.7* 10.5* 9.3* 11.3* 9.3* --  HCT 28.9* 31.8* 27.6* 34.3* 27.8* --  MCV 94.1 94.1 93.6 93.7 92.7 --  PLT 112* 99* 92* 157 118* --   Cardiac Enzymes:  Lab 08/29/12 1034  CKTOTAL --  CKMB --  CKMBINDEX --  TROPONINI <0.30   BNP: BNP (last 3 results)  Basename 08/29/12 1043 08/19/12 0423  PROBNP 555.5* 5740.0*   CBG:  Lab 09/04/12 0748 09/03/12 2038 09/03/12 1625 09/03/12 1140 09/03/12 0722  GLUCAP 181* 172* 274* 121* 85       Signed:  Rhesa Forsberg  [facility] 09/04/2012, 11:50 AM

## 2012-09-04 NOTE — Progress Notes (Signed)
09/04/12 1521 Patient being discharged home today. Pt expressed concerns with discharge this morning due to swelling in his feet. Discussed his concerns with Dr Sherrie Mustache on rounds this morning. Pt received instructions to continue taking medications as prescribed, including lasix to help reduce swelling and keep feet/legs elevated when lying or sitting. Education regarding use of TED hose, taking medications as prescribed and keeping feet/legs elevated reinforced by Dr Sherrie Mustache, nurse, and nursing supervisor. Nursing supervisor spoke with patient regarding discharge concerns and possible need for assistance with prescriptions. Pt denied need for assistance for prescriptions and stated just needed to go home per nursing supervisor. Notified Dr Sherrie Mustache as well, stated okay for discharge home. Pt stated okay with discharge plans and understood need to keep feet/legs elevated prior to discharge. Earnstine Regal, RN

## 2012-09-04 NOTE — Progress Notes (Signed)
09/04/12 1528 Patient discharged home with son this afternoon. Reviewed discharge instructions with patient, son at bedside. Given copy of instructions, prescriptions, f/u appointment information, and home medication list. Noted which medications patient had already taken today on discharge instructions. Reinforced education regarding TED hose, and keeping feet/legs elevated to help reduce swelling. Pt and son verbalized understanding. IV site d/c'd and within normal limits prior to discharge. Notified patient home health to resume and [facility] notified and to call him before they come out. Instructed to call [facility] if any questions/concerns. Stated understood. Portable O2 brought per son for discharge home. Pt left floor in stable condition via w/c accompanied by nurse tech. Earnstine Regal, RN

## 2013-01-24 ENCOUNTER — Inpatient Hospital Stay (HOSPITAL_COMMUNITY): Payer: Medicare Other

## 2013-01-24 ENCOUNTER — Inpatient Hospital Stay (HOSPITAL_COMMUNITY)
Admission: EM | Admit: 2013-01-24 | Discharge: 2013-01-27 | DRG: 190 | Disposition: A | Discharge status: Home Health | Payer: Medicare Other | Attending: Family Medicine | Admitting: Family Medicine

## 2013-01-24 ENCOUNTER — Encounter (HOSPITAL_COMMUNITY): Payer: Self-pay

## 2013-01-24 ENCOUNTER — Emergency Department (HOSPITAL_COMMUNITY): Payer: Medicare Other

## 2013-01-24 DIAGNOSIS — G8929 Other chronic pain: Secondary | ICD-10-CM | POA: Diagnosis present

## 2013-01-24 DIAGNOSIS — Z720 Tobacco use: Secondary | ICD-10-CM

## 2013-01-24 DIAGNOSIS — Z91199 Patient's noncompliance with other medical treatment and regimen due to unspecified reason: Secondary | ICD-10-CM

## 2013-01-24 DIAGNOSIS — Z9981 Dependence on supplemental oxygen: Secondary | ICD-10-CM

## 2013-01-24 DIAGNOSIS — R079 Chest pain, unspecified: Secondary | ICD-10-CM | POA: Diagnosis present

## 2013-01-24 DIAGNOSIS — I10 Essential (primary) hypertension: Secondary | ICD-10-CM | POA: Diagnosis present

## 2013-01-24 DIAGNOSIS — J441 Chronic obstructive pulmonary disease with (acute) exacerbation: Principal | ICD-10-CM

## 2013-01-24 DIAGNOSIS — Z9119 Patient's noncompliance with other medical treatment and regimen: Secondary | ICD-10-CM

## 2013-01-24 DIAGNOSIS — J9620 Acute and chronic respiratory failure, unspecified whether with hypoxia or hypercapnia: Secondary | ICD-10-CM | POA: Diagnosis present

## 2013-01-24 DIAGNOSIS — E871 Hypo-osmolality and hyponatremia: Secondary | ICD-10-CM | POA: Diagnosis present

## 2013-01-24 DIAGNOSIS — R1310 Dysphagia, unspecified: Secondary | ICD-10-CM | POA: Diagnosis present

## 2013-01-24 DIAGNOSIS — G609 Hereditary and idiopathic neuropathy, unspecified: Secondary | ICD-10-CM | POA: Diagnosis present

## 2013-01-24 DIAGNOSIS — I4891 Unspecified atrial fibrillation: Secondary | ICD-10-CM | POA: Diagnosis present

## 2013-01-24 DIAGNOSIS — E119 Type 2 diabetes mellitus without complications: Secondary | ICD-10-CM | POA: Diagnosis present

## 2013-01-24 DIAGNOSIS — IMO0002 Reserved for concepts with insufficient information to code with codable children: Secondary | ICD-10-CM | POA: Diagnosis present

## 2013-01-24 DIAGNOSIS — F411 Generalized anxiety disorder: Secondary | ICD-10-CM | POA: Diagnosis present

## 2013-01-24 DIAGNOSIS — F329 Major depressive disorder, single episode, unspecified: Secondary | ICD-10-CM | POA: Diagnosis present

## 2013-01-24 DIAGNOSIS — Z66 Do not resuscitate: Secondary | ICD-10-CM | POA: Diagnosis present

## 2013-01-24 DIAGNOSIS — I480 Paroxysmal atrial fibrillation: Secondary | ICD-10-CM | POA: Diagnosis present

## 2013-01-24 DIAGNOSIS — F3289 Other specified depressive episodes: Secondary | ICD-10-CM | POA: Diagnosis present

## 2013-01-24 DIAGNOSIS — M1990 Unspecified osteoarthritis, unspecified site: Secondary | ICD-10-CM | POA: Diagnosis present

## 2013-01-24 DIAGNOSIS — F4310 Post-traumatic stress disorder, unspecified: Secondary | ICD-10-CM | POA: Diagnosis present

## 2013-01-24 DIAGNOSIS — I252 Old myocardial infarction: Secondary | ICD-10-CM

## 2013-01-24 DIAGNOSIS — F1010 Alcohol abuse, uncomplicated: Secondary | ICD-10-CM | POA: Diagnosis present

## 2013-01-24 DIAGNOSIS — J9611 Chronic respiratory failure with hypoxia: Secondary | ICD-10-CM

## 2013-01-24 DIAGNOSIS — J449 Chronic obstructive pulmonary disease, unspecified: Secondary | ICD-10-CM | POA: Diagnosis present

## 2013-01-24 DIAGNOSIS — F419 Anxiety disorder, unspecified: Secondary | ICD-10-CM | POA: Diagnosis present

## 2013-01-24 DIAGNOSIS — D649 Anemia, unspecified: Secondary | ICD-10-CM | POA: Diagnosis present

## 2013-01-24 DIAGNOSIS — F17200 Nicotine dependence, unspecified, uncomplicated: Secondary | ICD-10-CM | POA: Diagnosis present

## 2013-01-24 DIAGNOSIS — I2510 Atherosclerotic heart disease of native coronary artery without angina pectoris: Secondary | ICD-10-CM | POA: Diagnosis present

## 2013-01-24 DIAGNOSIS — Z79899 Other long term (current) drug therapy: Secondary | ICD-10-CM

## 2013-01-24 LAB — COMPREHENSIVE METABOLIC PANEL
AST: 11 U/L (ref 0–37)
BUN: 18 mg/dL (ref 6–23)
CO2: 22 mEq/L (ref 19–32)
Calcium: 9.5 mg/dL (ref 8.4–10.5)
Chloride: 95 mEq/L — ABNORMAL LOW (ref 96–112)
Creatinine, Ser: 1.12 mg/dL (ref 0.50–1.35)
GFR calc Af Amer: 75 mL/min — ABNORMAL LOW (ref >90)
GFR calc non Af Amer: 65 mL/min — ABNORMAL LOW (ref >90)
Total Bilirubin: 0.2 mg/dL — ABNORMAL LOW (ref 0.3–1.2)

## 2013-01-24 LAB — CBC WITH DIFFERENTIAL/PLATELET
Basophils Absolute: 0 10*3/uL (ref 0.0–0.1)
Basophils Relative: 0 % (ref 0–1)
Eosinophils Relative: 2 % (ref 0–5)
HCT: 32.5 % — ABNORMAL LOW (ref 39.0–52.0)
Hemoglobin: 11.1 g/dL — ABNORMAL LOW (ref 13.0–17.0)
Lymphocytes Relative: 19 % (ref 12–46)
MCHC: 34.2 g/dL (ref 30.0–36.0)
MCV: 84.4 fL (ref 78.0–100.0)
Monocytes Absolute: 1 10*3/uL (ref 0.1–1.0)
Monocytes Relative: 7 % (ref 3–12)
RDW: 14.1 % (ref 11.5–15.5)

## 2013-01-24 LAB — TROPONIN I: Troponin I: <0.3 ng/mL (ref <0.30)

## 2013-01-24 LAB — PROTIME-INR: Prothrombin Time: 13.6 seconds (ref 11.6–15.2)

## 2013-01-24 LAB — PRO B NATRIURETIC PEPTIDE: Pro B Natriuretic peptide (BNP): 110.5 pg/mL (ref 0–125)

## 2013-01-24 MED ORDER — OXYCODONE HCL 5 MG PO TABS
5.0000 mg | ORAL_TABLET | ORAL | Status: DC | PRN
  Administered 2013-01-25 – 2013-01-27 (×9): 5 mg via ORAL
  Filled 2013-01-24 (×9): qty 1

## 2013-01-24 MED ORDER — DOCUSATE SODIUM 100 MG PO CAPS
100.0000 mg | ORAL_CAPSULE | Freq: Two times a day (BID) | ORAL | Status: DC
  Administered 2013-01-25 – 2013-01-27 (×5): 100 mg via ORAL
  Filled 2013-01-24 (×5): qty 1

## 2013-01-24 MED ORDER — INSULIN ASPART 100 UNIT/ML ~~LOC~~ SOLN: 0.0000 [IU] | Freq: Every day | SUBCUTANEOUS | Status: DC

## 2013-01-24 MED ORDER — LEVOFLOXACIN IN D5W 500 MG/100ML IV SOLN
500.0000 mg | Freq: Every day | INTRAVENOUS | Status: DC
  Administered 2013-01-25 – 2013-01-26 (×3): 500 mg via INTRAVENOUS
  Filled 2013-01-24 (×3): qty 100

## 2013-01-24 MED ORDER — INSULIN ASPART 100 UNIT/ML ~~LOC~~ SOLN
0.0000 [IU] | Freq: Three times a day (TID) | SUBCUTANEOUS | Status: DC
  Administered 2013-01-25: 15 [IU] via SUBCUTANEOUS

## 2013-01-24 MED ORDER — IPRATROPIUM BROMIDE 0.02 % IN SOLN
0.5000 mg | Freq: Once | RESPIRATORY_TRACT | Status: AC
  Administered 2013-01-24: 0.5 mg via RESPIRATORY_TRACT
  Filled 2013-01-24: qty 2.5

## 2013-01-24 MED ORDER — SODIUM CHLORIDE 0.9 % IJ SOLN: 3.0000 mL | Freq: Two times a day (BID) | INTRAMUSCULAR | Status: DC

## 2013-01-24 MED ORDER — NITROGLYCERIN 0.4 MG SL SUBL: 0.4000 mg | SUBLINGUAL_TABLET | SUBLINGUAL | Status: DC | PRN

## 2013-01-24 MED ORDER — ACETAMINOPHEN 325 MG PO TABS
ORAL_TABLET | ORAL | Status: AC
  Filled 2013-01-24: qty 2

## 2013-01-24 MED ORDER — ALPRAZOLAM 0.25 MG PO TABS
0.2500 mg | ORAL_TABLET | Freq: Three times a day (TID) | ORAL | Status: DC | PRN
  Administered 2013-01-25 – 2013-01-27 (×6): 0.25 mg via ORAL
  Filled 2013-01-24 (×7): qty 1

## 2013-01-24 MED ORDER — LEVALBUTEROL HCL 0.63 MG/3ML IN NEBU: 0.6300 mg | INHALATION_SOLUTION | RESPIRATORY_TRACT | Status: DC | PRN

## 2013-01-24 MED ORDER — IOHEXOL 350 MG/ML SOLN
100.0000 mL | Freq: Once | INTRAVENOUS | Status: AC | PRN
  Administered 2013-01-24: 100 mL via INTRAVENOUS

## 2013-01-24 MED ORDER — ONDANSETRON HCL 4 MG/2ML IJ SOLN: 4.0000 mg | Freq: Four times a day (QID) | INTRAMUSCULAR | Status: DC | PRN

## 2013-01-24 MED ORDER — VITAMIN B-1 100 MG PO TABS
100.0000 mg | ORAL_TABLET | Freq: Every day | ORAL | Status: DC
  Administered 2013-01-25 – 2013-01-27 (×3): 100 mg via ORAL
  Filled 2013-01-24 (×3): qty 1

## 2013-01-24 MED ORDER — ONDANSETRON HCL 4 MG PO TABS: 4.0000 mg | ORAL_TABLET | Freq: Four times a day (QID) | ORAL | Status: DC | PRN

## 2013-01-24 MED ORDER — METHYLPREDNISOLONE SODIUM SUCC 125 MG IJ SOLR
80.0000 mg | Freq: Four times a day (QID) | INTRAMUSCULAR | Status: DC
  Administered 2013-01-25 – 2013-01-26 (×6): 80 mg via INTRAVENOUS
  Filled 2013-01-24 (×6): qty 2

## 2013-01-24 MED ORDER — SODIUM CHLORIDE 0.9 % IJ SOLN
3.0000 mL | INTRAMUSCULAR | Status: DC | PRN
  Administered 2013-01-25: 3 mL via INTRAVENOUS

## 2013-01-24 MED ORDER — NICOTINE 14 MG/24HR TD PT24
14.0000 mg | MEDICATED_PATCH | Freq: Every day | TRANSDERMAL | Status: DC
  Administered 2013-01-25 – 2013-01-27 (×4): 14 mg via TRANSDERMAL
  Filled 2013-01-24 (×4): qty 1

## 2013-01-24 MED ORDER — METHYLPREDNISOLONE SODIUM SUCC 125 MG IJ SOLR
125.0000 mg | Freq: Once | INTRAMUSCULAR | Status: AC
  Administered 2013-01-24: 125 mg via INTRAVENOUS
  Filled 2013-01-24: qty 2

## 2013-01-24 MED ORDER — LEVOFLOXACIN IN D5W 500 MG/100ML IV SOLN
INTRAVENOUS | Status: AC
  Filled 2013-01-24: qty 100

## 2013-01-24 MED ORDER — FUROSEMIDE 20 MG PO TABS
20.0000 mg | ORAL_TABLET | Freq: Every day | ORAL | Status: DC
  Administered 2013-01-25 – 2013-01-27 (×3): 20 mg via ORAL
  Filled 2013-01-24 (×3): qty 1

## 2013-01-24 MED ORDER — ALBUTEROL SULFATE (5 MG/ML) 0.5% IN NEBU
5.0000 mg | INHALATION_SOLUTION | Freq: Once | RESPIRATORY_TRACT | Status: AC
  Administered 2013-01-24: 5 mg via RESPIRATORY_TRACT
  Filled 2013-01-24: qty 1

## 2013-01-24 MED ORDER — MORPHINE SULFATE 2 MG/ML IJ SOLN
2.0000 mg | INTRAMUSCULAR | Status: DC | PRN
  Administered 2013-01-26: 2 mg via INTRAVENOUS
  Filled 2013-01-24: qty 1

## 2013-01-24 MED ORDER — LEVALBUTEROL HCL 0.63 MG/3ML IN NEBU
0.6300 mg | INHALATION_SOLUTION | RESPIRATORY_TRACT | Status: DC
  Administered 2013-01-25 (×3): 0.63 mg via RESPIRATORY_TRACT
  Filled 2013-01-24 (×3): qty 3

## 2013-01-24 MED ORDER — SODIUM CHLORIDE 0.9 % IJ SOLN
3.0000 mL | Freq: Two times a day (BID) | INTRAMUSCULAR | Status: DC
  Administered 2013-01-25 – 2013-01-26 (×3): 3 mL via INTRAVENOUS

## 2013-01-24 MED ORDER — BENZONATATE 100 MG PO CAPS: 100.0000 mg | ORAL_CAPSULE | Freq: Three times a day (TID) | ORAL | Status: DC | PRN

## 2013-01-24 MED ORDER — ACETAMINOPHEN 650 MG RE SUPP: 650.0000 mg | Freq: Four times a day (QID) | RECTAL | Status: DC | PRN

## 2013-01-24 MED ORDER — ACETAMINOPHEN 325 MG PO TABS
650.0000 mg | ORAL_TABLET | Freq: Four times a day (QID) | ORAL | Status: DC | PRN
  Filled 2013-01-24: qty 2

## 2013-01-24 MED ORDER — BUDESONIDE-FORMOTEROL FUMARATE 160-4.5 MCG/ACT IN AERO
2.0000 | INHALATION_SPRAY | Freq: Two times a day (BID) | RESPIRATORY_TRACT | Status: DC
  Administered 2013-01-25 – 2013-01-26 (×3): 2 via RESPIRATORY_TRACT
  Filled 2013-01-24: qty 6

## 2013-01-24 MED ORDER — ENOXAPARIN SODIUM 40 MG/0.4ML ~~LOC~~ SOLN
40.0000 mg | SUBCUTANEOUS | Status: DC
  Administered 2013-01-25 – 2013-01-27 (×3): 40 mg via SUBCUTANEOUS
  Filled 2013-01-24 (×3): qty 0.4

## 2013-01-24 MED ORDER — ACETAMINOPHEN 325 MG PO TABS
650.0000 mg | ORAL_TABLET | Freq: Once | ORAL | Status: AC
  Administered 2013-01-24: 650 mg via ORAL

## 2013-01-24 MED ORDER — TRAZODONE HCL 50 MG PO TABS
25.0000 mg | ORAL_TABLET | Freq: Every evening | ORAL | Status: DC | PRN
  Administered 2013-01-25 – 2013-01-26 (×3): 25 mg via ORAL
  Filled 2013-01-24 (×3): qty 1

## 2013-01-24 MED ORDER — SODIUM CHLORIDE 0.9 % IV SOLN
250.0000 mL | INTRAVENOUS | Status: DC | PRN
  Administered 2013-01-26: 250 mL via INTRAVENOUS

## 2013-01-24 MED ORDER — TIOTROPIUM BROMIDE MONOHYDRATE 18 MCG IN CAPS
18.0000 ug | ORAL_CAPSULE | Freq: Every day | RESPIRATORY_TRACT | Status: DC
  Administered 2013-01-25 – 2013-01-27 (×3): 18 ug via RESPIRATORY_TRACT
  Filled 2013-01-24: qty 5

## 2013-01-24 MED ORDER — MORPHINE SULFATE 2 MG/ML IJ SOLN
2.0000 mg | Freq: Once | INTRAMUSCULAR | Status: AC
  Administered 2013-01-24: 2 mg via INTRAVENOUS
  Filled 2013-01-24: qty 1

## 2013-01-24 MED ORDER — DILTIAZEM HCL ER COATED BEADS 180 MG PO CP24
180.0000 mg | ORAL_CAPSULE | Freq: Every day | ORAL | Status: DC
  Administered 2013-01-25 – 2013-01-27 (×3): 180 mg via ORAL
  Filled 2013-01-24 (×3): qty 1

## 2013-01-24 MED ORDER — TAMSULOSIN HCL 0.4 MG PO CAPS
0.4000 mg | ORAL_CAPSULE | Freq: Every day | ORAL | Status: DC
  Administered 2013-01-25 – 2013-01-26 (×2): 0.4 mg via ORAL
  Filled 2013-01-24 (×2): qty 1

## 2013-01-24 NOTE — ED Notes (Signed)
Pt reports upper mid chest pain that began about 3 days ago.  Pt c/o some sob, but denies any n/v, or diaphoresis.  Per ems, pt CBG was 79.

## 2013-01-24 NOTE — ED Notes (Signed)
Patient ambulatory to restroom  ?

## 2013-01-24 NOTE — H&P (Signed)
[facility] History and Physical  [name]:[id number] DOB: 14-Feb-1941 DOA: 01/24/2013   PCP: Redmond Baseman, MD  Specialists: None  Chief Complaint: Chest pain, and weakness for 5 days  HPI: [name] is a 72 y.o. male with a past medical history COPD, chronic back pain, diabetes, paroxysmal atrial fibrillation, not on anticoagulation, who, unfortunately, continues to smoke cigarettes. Comes in with complaints of chest pain, and weakness for the last 4 or 5 days. The pain is described as a sharp pain in the retrosternal area radiating to the back. 10 out of 10 in intensity. It gets worse with deep breathing and cough. Social with dizziness and perhaps syncopal episodes. Has had nausea, vomiting, as well. He has had difficulty breathing all this time. Increases with exertion. He's had a dry cough and maybe has had a fever, although he's not sure. He uses oxygen at home at 2 L per minute and has increased the rate once in a while because of difficulty breathing. Denies any sick contacts. He denies any recent travel. He was last hospitalized here earlier this year for similar issues. He does take all his medications as prescribed. Unfortunately, continues to smoke. He mentioned that his dizziness increases when he is exerting himself.  Home Medications: Prior to Admission medications   Medication Sig Start Date End Date Taking? Authorizing Provider  albuterol (PROVENTIL HFA) 108 (90 BASE) MCG/ACT inhaler Inhale 2 puffs into the lungs 4 (four) times daily as needed. For shortness of breath/ COPD   Yes Historical Provider, MD  ALPRAZolam (XANAX) 0.25 MG tablet Take 1 tablet (0.25 mg total) by mouth 3 (three) times daily as needed for anxiety (air hunger, dyspnea). 09/04/12  Yes Elliot Cousin, MD  ciprofloxacin (CIPRO) 500 MG tablet Take 500 mg by mouth 2 (two) times daily. 01/18/13 02/17/13 Yes Historical Provider, MD  metFORMIN (GLUCOPHAGE) 1000 MG tablet Take 1,000 mg by mouth 2  (two) times daily.   Yes Historical Provider, MD  oxyCODONE-acetaminophen (PERCOCET/ROXICET) 5-325 MG per tablet Take 1 tablet by mouth 2 (two) times daily. 01/04/13  Yes Historical Provider, MD  Tamsulosin HCl (FLOMAX) 0.4 MG CAPS Take 1 capsule (0.4 mg total) by mouth daily after supper. 08/24/12  Yes Rhetta Mura, MD  tiotropium (SPIRIVA) 18 MCG inhalation capsule Place 18 mcg into inhaler and inhale daily.     Yes Historical Provider, MD  albuterol (PROVENTIL) (2.5 MG/3ML) 0.083% nebulizer solution Take 3 mLs (2.5 mg total) by nebulization 4 (four) times daily. 09/04/12   Elliot Cousin, MD  alum & mag hydroxide-simeth (MAALOX/MYLANTA) 200-200-20 MG/5ML suspension Take 30 mLs by mouth as needed. 08/24/12   Rhetta Mura, MD  benzonatate (TESSALON) 200 MG capsule Take 1 capsule (200 mg total) by mouth 3 (three) times daily as needed for cough. 09/04/12   Elliot Cousin, MD  budesonide-formoterol [facility]) 160-4.5 MCG/ACT inhaler Inhale 2 puffs into the lungs 2 (two) times daily. 09/04/12 11/17/13  Elliot Cousin, MD  diltiazem (CARDIZEM CD) 180 MG 24 hr capsule Take 1 capsule (180 mg total) by mouth daily. For control of your heart rate. 09/04/12   Elliot Cousin, MD  furosemide (LASIX) 20 MG tablet Take 1 tablet (20 mg total) by mouth daily. For swelling in your feet and legs. Take potassium supplement with this medicine. 09/04/12   Elliot Cousin, MD  guaiFENesin (MUCINEX) 600 MG 12 hr tablet Take 2 tablets (1,200 mg total) by mouth 2 (two) times daily. 08/24/12   Rhetta Mura, MD  HYDROcodone-acetaminophen (NORCO/VICODIN)  5-325 MG per tablet Take 1 tablet by mouth every 4 (four) hours as needed. 09/04/12   Elliot Cousin, MD  ipratropium (ATROVENT) 0.02 % nebulizer solution Take 500 mcg by nebulization 2 (two) times daily as needed. For chest congestion    Historical Provider, MD  nicotine (NICODERM CQ - DOSED IN MG/24 HR) 7 mg/24hr patch Place 1 patch onto the skin daily. 08/24/12   Rhetta Mura, MD  potassium chloride (K-DUR,KLOR-CON) 20 MEQ tablet Take 1 tablet (20 mEq total) by mouth daily. Take potassium supplement with furosemide (Lasix). 09/04/12   Elliot Cousin, MD  predniSONE (DELTASONE) 10 MG tablet Starting tomorrow, take 5 tablets daily for one day; then 4 tablets daily for one day; then 3 tablets daily for one day; then 2 tablets daily for one day; then 1 tablet daily for one day; then a half a tablet daily thereafter. 09/04/12   Elliot Cousin, MD  sennosides-docusate sodium (SENOKOT-S) 8.6-50 MG tablet Take 1-2 tablets by mouth daily. Take daily to avoid constipation. 09/04/12   Elliot Cousin, MD  sorbitol 70 % SOLN Take 30 mLs by mouth daily as needed. 08/24/12   Rhetta Mura, MD  traZODone (DESYREL) 25 mg TABS Take 0.5 tablets (25 mg total) by mouth at bedtime as needed (insomnia). 09/04/12   Elliot Cousin, MD    Allergies:  Allergies  Allergen Reactions  . Aspirin Other (See Comments)    Stomach upset    Past Medical History: Past Medical History  Diagnosis Date  . Essential hypertension, benign   . Alcohol abuse   . PTSD (post-traumatic stress disorder)   . Chronic back pain   . Depression   . Peripheral neuropathy   . DJD (degenerative joint disease)   . COPD (chronic obstructive pulmonary disease)   . Anemia   . Myocardial infarction   . Type II or unspecified type diabetes mellitus without mention of complication, not stated as uncontrolled   . Esophageal dysmotility 07/04/2011  . Chronic respiratory failure with hypoxia   . Anxiety   . Paroxysmal atrial fibrillation 08/29/2012  . Partial small bowel obstruction 09/02/2012  . Bilateral lower extremity edema 09/03/2012    Possibly secondary to calcium channel blocker.    Past Surgical History  Procedure Laterality Date  . Hernia repair      X 3  . Spinal surgeries      X 4  . Appendectomy    . Prostate surgery      Social History:  reports that he has been smoking Cigarettes.  He has been  smoking about 0.50 packs per day. He uses smokeless tobacco. He reports that  drinks alcohol. He reports that he does not use illicit drugs.  Living Situation: He lives by himself Activity Level: Uses a walker to ambulate   Family History:  Family History  Problem Relation Age of Onset  . Lung disease Mother   . Prostate cancer Father      Review of Systems - History obtained from the patient General ROS: positive for  - fatigue Psychological ROS: positive for - anxiety Ophthalmic ROS: negative ENT ROS: negative Allergy and Immunology ROS: negative Hematological and Lymphatic ROS: negative Endocrine ROS: negative Respiratory ROS: as in hpi Cardiovascular ROS: as in hpi Gastrointestinal ROS: no abdominal pain, change in bowel habits, or black or bloody stools Genito-Urinary ROS: no dysuria, trouble voiding, or hematuria Musculoskeletal ROS: negative Neurological ROS: no TIA or stroke symptoms Dermatological ROS: negative  Physical Examination  Filed Vitals:  01/24/13 1943 01/24/13 2000 01/24/13 2100 01/24/13 2123  BP:  115/95 140/69   Pulse:      Temp:    98.4 F (36.9 C)  TempSrc:    Oral  Resp:  25 17   SpO2: 98%   98%    General appearance: alert, cooperative, appears stated age and no distress Head: Normocephalic, without obvious abnormality, atraumatic Eyes: conjunctivae/corneas clear. PERRL, EOM's intact.  Throat: lips, mucosa, and tongue normal; teeth and gums normal Neck: no adenopathy, no carotid bruit, no JVD, supple, symmetrical, trachea midline and thyroid not enlarged, symmetric, no tenderness/mass/nodules Resp: He has end expiratory wheezing bilaterally. Few crackles at the bases. Cardio: regular rate and rhythm, S1, S2 normal, no murmur, click, rub or gallop GI: soft, non-tender; bowel sounds normal; no masses,  no organomegaly Extremities: extremities normal, atraumatic, no cyanosis or edema Pulses: 2+ and symmetric Skin: Skin color, texture, turgor  normal. No rashes or lesions Lymph nodes: Cervical, supraclavicular, and axillary nodes normal. Neurologic: He is alert and oriented x3. No focal neurological deficits are present.  Laboratory Data: Results for orders placed during the hospital encounter of 01/24/13 (from the past 48 hour(s))  CBC WITH DIFFERENTIAL     Status: Abnormal   Collection Time    01/24/13  4:15 PM      Result Value Range   WBC 14.2 (*) 4.0 - 10.5 K/uL   RBC 3.85 (*) 4.22 - 5.81 MIL/uL   Hemoglobin 11.1 (*) 13.0 - 17.0 g/dL   HCT 40.9 (*) 81.1 - 91.4 %   MCV 84.4  78.0 - 100.0 fL   MCH 28.8  26.0 - 34.0 pg   MCHC 34.2  30.0 - 36.0 g/dL   RDW 78.2  95.6 - 21.3 %   Platelets 255  150 - 400 K/uL   Neutrophils Relative % 73  43 - 77 %   Neutro Abs 10.4 (*) 1.7 - 7.7 K/uL   Lymphocytes Relative 19  12 - 46 %   Lymphs Abs 2.7  0.7 - 4.0 K/uL   Monocytes Relative 7  3 - 12 %   Monocytes Absolute 1.0  0.1 - 1.0 K/uL   Eosinophils Relative 2  0 - 5 %   Eosinophils Absolute 0.2  0.0 - 0.7 K/uL   Basophils Relative 0  0 - 1 %   Basophils Absolute 0.0  0.0 - 0.1 K/uL  COMPREHENSIVE METABOLIC PANEL     Status: Abnormal   Collection Time    01/24/13  4:15 PM      Result Value Range   Sodium 131 (*) 135 - 145 mEq/L   Potassium 4.7  3.5 - 5.1 mEq/L   Chloride 95 (*) 96 - 112 mEq/L   CO2 22  19 - 32 mEq/L   Glucose, Bld 93  70 - 99 mg/dL   BUN 18  6 - 23 mg/dL   Creatinine, Ser 0.86  0.50 - 1.35 mg/dL   Calcium 9.5  8.4 - 57.8 mg/dL   Total Protein 7.2  6.0 - 8.3 g/dL   Albumin 3.5  3.5 - 5.2 g/dL   AST 11  0 - 37 U/L   ALT 8  0 - 53 U/L   Alkaline Phosphatase 99  39 - 117 U/L   Total Bilirubin 0.2 (*) 0.3 - 1.2 mg/dL   GFR calc non Af Amer 65 (*) >90 mL/min   GFR calc Af Amer 75 (*) >90 mL/min   Comment:  The eGFR has been calculated     using the CKD EPI equation.     This calculation has not been     validated in all clinical     situations.     eGFR's persistently     <90 mL/min signify      possible Chronic Kidney Disease.  PRO B NATRIURETIC PEPTIDE     Status: None   Collection Time    01/24/13  4:15 PM      Result Value Range   Pro B Natriuretic peptide (BNP) 110.5  0 - 125 pg/mL  TROPONIN I     Status: None   Collection Time    01/24/13  4:15 PM      Result Value Range   Troponin I <0.30  <0.30 ng/mL   Comment:            Due to the release kinetics of cTnI,     a negative result within the first hours     of the onset of symptoms does not rule out     myocardial infarction with certainty.     If myocardial infarction is still suspected,     repeat the test at appropriate intervals.  PROTIME-INR     Status: None   Collection Time    01/24/13  4:15 PM      Result Value Range   Prothrombin Time 13.6  11.6 - 15.2 seconds   INR 1.05  0.00 - 1.49    Radiology Reports: Dg Chest 2 View  01/24/2013   *RADIOLOGY REPORT*  Clinical Data: Short of breath and chest pain  CHEST - 2 VIEW  Comparison: 08/29/2012  Findings: COPD with marked hyperinflation and hyperlucency of the lungs.  Negative for pneumonia.  Negative for heart failure or effusion.  No mass lesion.  IMPRESSION: Severe COPD and hyperinflation.  No acute radiographic abnormality.   Original Report Authenticated By: Janeece Riggers, M.D.    Electrocardiogram: EKG shows sinus tachycardia. 101 beats per minute. Normal axis. Intervals are normal. No definite Q waves. No concerning ST or T-wave changes are noted.  Problem List  Principal Problem:   Chest pain at rest Active Problems:   COPD with exacerbation   DM type 2 (diabetes mellitus, type 2)   HTN (hypertension)   DDD (degenerative disc disease)   Anxiety   Chronic respiratory failure with hypoxia   Paroxysmal atrial fibrillation   Assessment: This is a 72 year old, Caucasian male, who presents with chest pain, shortness of breath, and possible syncopal episodes. I think all of his symptoms are likely secondary to COPD. He likely has advanced,  possibly even end-stage COPD. Unfortunately, he continues to smoke. Because of certain concerning characteristics of his chest pain he will need evaluation for thromboembolic events. EKG is nonischemic. He appears to have COPD exacerbation  Plan: #1 chest pain at rest: Proceed with CT angio to rule out venous thromboembolism. He'll be given morphine for pain control. As his respiratory status improves his chest pain should improve as well. Echocardiogram was done earlier this year, which showed normal systolic function.  #2 acute respiratory failure with COPD exacerbation: Given steroids, antibiotics, and nebulizer treatments. Monitor him closely. At this time there is no need for blood gas. Smoking cessation was emphasized. Nicotine patch will utilize.  #3 dizziness with syncopal episodes: Most likely secondary to his dyspnea and his acute respiratory failure. Follow up on CT report. He does not have any neurological deficits at this time. He  tells me that he's been having these spells for the last 5-6 months. He will be monitored on telemetry. There is no recent imaging study of his head and that is something that can be considered.  #4 history of paroxysmal atrial fibrillation: Currently, he is in sinus rhythm. He is not on anticoagulation due to noncompliance.  #5 history of, diabetes, type II: Hold his metformin because he will get the contrast. We will place him on sliding scale coverage. HbA1c will be obtained.  #6 history of chronic back pain: Continue with pain medications as needed.  DVT Prophylaxis: Lovenox Code Status: Previous limited code status was noted. CODE STATUS was again discussed with the patient. He does not want resuscitation or life support. He'll be a DNR/DNI Family Communication: Discussed with the patient in detail  Disposition Plan: Admit to telemetry   Further management decisions will depend on results of further testing and patient's response to  treatment.  [facility]  [facility] Pager [phone]  If 7PM-7AM, please contact night-coverage [email or web address] Password [facility]  01/24/2013, 9:41 PM

## 2013-01-24 NOTE — ED Provider Notes (Signed)
History  This chart was scribed for [name], * by Manuela Schwartz, ED scribe. This patient was seen in room APA15/AP[facility]15 and the patient's care was started at 1552.  CSN: [id number] Arrival date & time 01/24/13  1552  First MD Initiated Contact with Patient 01/24/13 1557     Chief Complaint  Patient presents with  . Chest Pain  . Shortness of Breath   The history is provided by the patient. No language interpreter was used.  HPI Comments: [name] is a 72 y.o. male who presents to the Emergency Department complaining of intermittent, mild, left sided chest pain onset x3 days ago with associated SOB. He received albuterol breathing tx by EMS en route without any improvement in his sx. He states a heart hx with previous MI and Afib. He states associated nausea, and generalized fatigue/weakness. He also c/o abdominal pain.    Past Medical History  Diagnosis Date  . Essential hypertension, benign   . Alcohol abuse   . PTSD (post-traumatic stress disorder)   . Chronic back pain   . Depression   . Peripheral neuropathy   . DJD (degenerative joint disease)   . COPD (chronic obstructive pulmonary disease)   . Anemia   . Myocardial infarction   . Type II or unspecified type diabetes mellitus without mention of complication, not stated as uncontrolled   . Esophageal dysmotility 07/04/2011  . Chronic respiratory failure with hypoxia   . Anxiety   . Paroxysmal atrial fibrillation 08/29/2012  . Partial small bowel obstruction 09/02/2012  . Bilateral lower extremity edema 09/03/2012    Possibly secondary to calcium channel blocker.   Past Surgical History  Procedure Laterality Date  . Hernia repair      X 3  . Spinal surgeries      X 4  . Appendectomy    . Prostate surgery     Family History  Problem Relation Age of Onset  . Lung disease Mother   . Prostate cancer Father    History  Substance Use Topics  . Smoking status: Current Every Day Smoker -- 0.50 packs/day     Types: Cigarettes  . Smokeless tobacco: Current User  . Alcohol Use: Yes     Comment: Occasional    Review of Systems  Constitutional: Negative for fever and chills.          Respiratory: Positive for shortness of breath.   Cardiovascular: Positive for chest pain (left sided cp).  Gastrointestinal: Positive for nausea and abdominal pain. Negative for vomiting.  Musculoskeletal:       Generalized lower extremity weakness  Neurological: Negative for weakness.  All other systems reviewed and are negative.  A complete 10 system review of systems was obtained and all systems are negative except as noted in the HPI and PMH.    Allergies  Aspirin  Home Medications   Current Outpatient Rx  Name  Route  Sig  Dispense  Refill  . albuterol (PROVENTIL HFA) 108 (90 BASE) MCG/ACT inhaler   Inhalation   Inhale 2 puffs into the lungs 4 (four) times daily as needed. For shortness of breath/ COPD         . albuterol (PROVENTIL) (2.5 MG/3ML) 0.083% nebulizer solution   Nebulization   Take 3 mLs (2.5 mg total) by nebulization 4 (four) times daily.   75 mL      . ALPRAZolam (XANAX) 0.25 MG tablet   Oral   Take 1 tablet (0.25 mg total) by  mouth 3 (three) times daily as needed for anxiety (air hunger, dyspnea).   45 tablet   0   . alum & mag hydroxide-simeth (MAALOX/MYLANTA) 200-200-20 MG/5ML suspension   Oral   Take 30 mLs by mouth as needed.   355 mL   0   . benzonatate (TESSALON) 200 MG capsule   Oral   Take 1 capsule (200 mg total) by mouth 3 (three) times daily as needed for cough.   20 capsule   0   . budesonide-formoterol (SYMBICORT) 160-4.5 MCG/ACT inhaler   Inhalation   Inhale 2 puffs into the lungs 2 (two) times daily.   1 Inhaler   2   . diltiazem (CARDIZEM CD) 180 MG 24 hr capsule   Oral   Take 1 capsule (180 mg total) by mouth daily. For control of your heart rate.   30 capsule   2   . furosemide (LASIX) 20 MG tablet   Oral   Take 1 tablet (20 mg  total) by mouth daily. For swelling in your feet and legs. Take potassium supplement with this medicine.   30 tablet   2   . guaiFENesin (MUCINEX) 600 MG 12 hr tablet   Oral   Take 2 tablets (1,200 mg total) by mouth 2 (two) times daily.   60 tablet   0   . HYDROcodone-acetaminophen (NORCO/VICODIN) 5-325 MG per tablet   Oral   Take 1 tablet by mouth every 4 (four) hours as needed.   45 tablet   0   . ipratropium (ATROVENT) 0.02 % nebulizer solution   Nebulization   Take 500 mcg by nebulization 2 (two) times daily as needed. For chest congestion         . metFORMIN (GLUCOPHAGE) 1000 MG tablet   Oral   Take 1,000 mg by mouth 2 (two) times daily.         . nicotine (NICODERM CQ - DOSED IN MG/24 HR) 7 mg/24hr patch   Transdermal   Place 1 patch onto the skin daily.   28 patch   0   . potassium chloride (K-DUR,KLOR-CON) 20 MEQ tablet   Oral   Take 1 tablet (20 mEq total) by mouth daily. Take potassium supplement with furosemide (Lasix).   30 tablet   2   . predniSONE (DELTASONE) 10 MG tablet      Starting tomorrow, take 5 tablets daily for one day; then 4 tablets daily for one day; then 3 tablets daily for one day; then 2 tablets daily for one day; then 1 tablet daily for one day; then a half a tablet daily thereafter.   30 tablet   2   . sennosides-docusate sodium (SENOKOT-S) 8.6-50 MG tablet   Oral   Take 1-2 tablets by mouth daily. Take daily to avoid constipation.         . sorbitol 70 % SOLN   Oral   Take 30 mLs by mouth daily as needed.   500 mL   0   . Tamsulosin HCl (FLOMAX) 0.4 MG CAPS   Oral   Take 1 capsule (0.4 mg total) by mouth daily after supper.   30 capsule   0   . tiotropium (SPIRIVA) 18 MCG inhalation capsule   Inhalation   Place 18 mcg into inhaler and inhale daily.           . traZODone (DESYREL) 25 mg TABS   Oral   Take 0.5 tablets (25 mg total) by mouth at bedtime  as needed (insomnia).   30 tablet   0    There were no  vitals taken for this visit. Physical Exam  Nursing note and vitals reviewed. Constitutional: He is oriented to person, place, and time. He appears well-developed and well-nourished. No distress.  HENT:  Head: Normocephalic and atraumatic.  Right Ear: Hearing normal.  Left Ear: Hearing normal.  Nose: Nose normal.  Mouth/Throat: Oropharynx is clear and moist and mucous membranes are normal.  Eyes: Conjunctivae and EOM are normal. Pupils are equal, round, and reactive to light.  Neck: Normal range of motion. Neck supple.  Cardiovascular: Regular rhythm, S1 normal and S2 normal.  Exam reveals no gallop and no friction rub.   No murmur heard. Pulmonary/Chest: Effort normal. No respiratory distress. He has rales (rales and rhonchi throughout). He exhibits no tenderness.  Abdominal: Soft. Normal appearance and bowel sounds are normal. There is no hepatosplenomegaly. There is no tenderness. There is no rebound, no guarding, no tenderness at McBurney's point and negative Murphy's sign. No hernia.  Musculoskeletal: Normal range of motion.  Neurological: He is alert and oriented to person, place, and time. He has normal strength. No cranial nerve deficit or sensory deficit. Coordination normal. GCS eye subscore is 4. GCS verbal subscore is 5. GCS motor subscore is 6.  Skin: Skin is warm, dry and intact. No rash noted. No cyanosis.  Psychiatric: He has a normal mood and affect. His speech is normal and behavior is normal. Thought content normal.    ED Course  Procedures (including critical care time) DIAGNOSTIC STUDIES: Oxygen Saturation is 98% on room air, normal by my interpretation.    COORDINATION OF CARE: At 420 PM Discussed treatment plan with patient which includes blood work, cardiac markers. Patient agrees.     EKG:  Date: 01/24/2013  Rate: 101  Rhythm: sinus tachycardia  QRS Axis: normal  Intervals: normal  ST/T Wave abnormalities: normal  Conduction Disutrbances:none   Narrative Interpretation:   Old EKG Reviewed: unchanged   Labs Reviewed  CBC WITH DIFFERENTIAL - Abnormal; Notable for the following:    WBC 14.2 (*)    RBC 3.85 (*)    Hemoglobin 11.1 (*)    HCT 32.5 (*)    Neutro Abs 10.4 (*)    All other components within normal limits  COMPREHENSIVE METABOLIC PANEL - Abnormal; Notable for the following:    Sodium 131 (*)    Chloride 95 (*)    Total Bilirubin 0.2 (*)    GFR calc non Af Amer 65 (*)    GFR calc Af Amer 75 (*)    All other components within normal limits  COMPREHENSIVE METABOLIC PANEL - Abnormal; Notable for the following:    Sodium 131 (*)    Chloride 94 (*)    Glucose, Bld 299 (*)    BUN 24 (*)    Total Bilirubin 0.2 (*)    GFR calc non Af Amer 70 (*)    GFR calc Af Amer 81 (*)    All other components within normal limits  CBC - Abnormal; Notable for the following:    WBC 12.9 (*)    RBC 3.99 (*)    Hemoglobin 11.5 (*)    HCT 33.8 (*)    All other components within normal limits  GLUCOSE, CAPILLARY - Abnormal; Notable for the following:    Glucose-Capillary 197 (*)    All other components within normal limits  GLUCOSE, CAPILLARY - Abnormal; Notable for the following:  Glucose-Capillary 356 (*)    All other components within normal limits  CBC - Abnormal; Notable for the following:    WBC 15.7 (*)    RBC 3.61 (*)    Hemoglobin 10.2 (*)    HCT 30.4 (*)    All other components within normal limits  BASIC METABOLIC PANEL - Abnormal; Notable for the following:    Sodium 134 (*)    Glucose, Bld 169 (*)    BUN 26 (*)    GFR calc non Af Amer 61 (*)    GFR calc Af Amer 71 (*)    All other components within normal limits  GLUCOSE, CAPILLARY - Abnormal; Notable for the following:    Glucose-Capillary 111 (*)    All other components within normal limits  GLUCOSE, CAPILLARY - Abnormal; Notable for the following:    Glucose-Capillary 201 (*)    All other components within normal limits  GLUCOSE, CAPILLARY - Abnormal;  Notable for the following:    Glucose-Capillary 239 (*)    All other components within normal limits  PRO B NATRIURETIC PEPTIDE  TROPONIN I  PROTIME-INR  TROPONIN I  TROPONIN I  TROPONIN I  TSH  HEMOGLOBIN A1C   Dg Chest 2 View  01/24/2013   *RADIOLOGY REPORT*  Clinical Data: Short of breath and chest pain  CHEST - 2 VIEW  Comparison: 08/29/2012  Findings: COPD with marked hyperinflation and hyperlucency of the lungs.  Negative for pneumonia.  Negative for heart failure or effusion.  No mass lesion.  IMPRESSION: Severe COPD and hyperinflation.  No acute radiographic abnormality.   Original Report Authenticated By: Janeece Riggers, M.D.   Ct Head Wo Contrast  01/25/2013   *RADIOLOGY REPORT*  Clinical Data: Dizziness, syncope.  CT HEAD WITHOUT CONTRAST  Technique:  Contiguous axial images were obtained from the base of the skull through the vertex without contrast.  Comparison: 06/24/2005  Findings: There is atrophy and chronic small vessel disease changes. No acute intracranial abnormality.  Specifically, no hemorrhage, hydrocephalus, mass lesion, acute infarction, or significant intracranial injury.  No acute calvarial abnormality. Visualized paranasal sinuses and mastoids clear.  Orbital soft tissues unremarkable.  IMPRESSION: No acute intracranial abnormality.  Atrophy, chronic microvascular disease.   Original Report Authenticated By: Charlett Nose, M.D.   Ct Angio Chest Pe W/cm &/or Wo Cm  01/24/2013   *RADIOLOGY REPORT*  Clinical Data: Chest pain and short of breath  CT ANGIOGRAPHY CHEST  Technique:  Multidetector CT imaging of the chest using the standard protocol during bolus administration of intravenous contrast. Multiplanar reconstructed images including MIPs were obtained and reviewed to evaluate the vascular anatomy.  Contrast: OMNIPAQUE IOHEXOL 350 MG/ML SOLN  Comparison: Chest x-ray today.  CT chest 06/15/2012  Findings: Negative for pulmonary embolism.  Atherosclerotic aorta without  aneurysm or dissection.  Heart size is within normal limits.  No pericardial effusion.  Coronary artery calcification.  Severe apical emphysema.  Negative for pneumonia.  Negative for mass or adenopathy.  No pleural effusion. Right middle lobe scarring is stable.  Compression fracture approximately T9 unchanged from prior CT.  No acute bony lesion.  IMPRESSION: COPD and emphysema.  Negative for pulmonary embolism or other acute abnormality.   Original Report Authenticated By: Janeece Riggers, M.D.   Dg Chest Port 1 View  01/25/2013   *RADIOLOGY REPORT*  Clinical Data: Severe emphysema with shortness of breath and chest pain  PORTABLE CHEST - 1 VIEW  Comparison: 01/24/2013, chest x-ray and CT  Findings: Heart  and mediastinal contours are stable.  The lung fields demonstrate some prominence of the interstitial markings diffusely and more pronounced at the lung bases compatible with underlying bronchitic change. This finding is unchanged.  No signs of congestive failure or new focal infiltrate are seen.  No pleural fluid or increase in peribronchial cuffing is identified. Mild increased density medially along the right cardiac border correlates with an area of stable right middle lobe scarring well demonstrated on recent CT.  IMPRESSION: Stable cardiopulmonary appearance with no new focal or acute abnormality identified   Original Report Authenticated By: Rhodia Albright, M.D.   Diagnosis: 1. COPD 2. Chest Pain   MDM  Patient seen and evaluated for chest pain and shortness of breath. Patient has a history of CAD as well as COPD. Cardiac workup is negative, requires further evaluation. Patient also very dyspneic, weak. He has significant shortness of breath just simply standing at the bedside. Will require further treatment for COPD as well. Patient to be admitted to the hospital.  I personally performed the services described in this documentation, which was scribed in my presence. The recorded information has  been reviewed and is accurate.     Brandon Crease, MD 01/26/13 [phone]

## 2013-01-24 NOTE — ED Notes (Signed)
Pt c/o headache, notified edp

## 2013-01-25 ENCOUNTER — Inpatient Hospital Stay (HOSPITAL_COMMUNITY): Payer: Medicare Other

## 2013-01-25 LAB — CBC
MCV: 84.7 fL (ref 78.0–100.0)
Platelets: 251 10*3/uL (ref 150–400)
RDW: 13.9 % (ref 11.5–15.5)
WBC: 12.9 10*3/uL — ABNORMAL HIGH (ref 4.0–10.5)

## 2013-01-25 LAB — GLUCOSE, CAPILLARY
Glucose-Capillary: 197 mg/dL — ABNORMAL HIGH (ref 70–99)
Glucose-Capillary: 201 mg/dL — ABNORMAL HIGH (ref 70–99)

## 2013-01-25 LAB — COMPREHENSIVE METABOLIC PANEL
Alkaline Phosphatase: 110 U/L (ref 39–117)
BUN: 24 mg/dL — ABNORMAL HIGH (ref 6–23)
Chloride: 94 mEq/L — ABNORMAL LOW (ref 96–112)
GFR calc Af Amer: 81 mL/min — ABNORMAL LOW (ref >90)
Glucose, Bld: 299 mg/dL — ABNORMAL HIGH (ref 70–99)
Potassium: 4.4 mEq/L (ref 3.5–5.1)
Total Bilirubin: 0.2 mg/dL — ABNORMAL LOW (ref 0.3–1.2)

## 2013-01-25 LAB — HEMOGLOBIN A1C: Mean Plasma Glucose: 114 mg/dL (ref <117)

## 2013-01-25 MED ORDER — POTASSIUM CHLORIDE IN NACL 20-0.9 MEQ/L-% IV SOLN
INTRAVENOUS | Status: DC
  Administered 2013-01-25: 13:00:00 via INTRAVENOUS

## 2013-01-25 MED ORDER — INSULIN DETEMIR 100 UNIT/ML ~~LOC~~ SOLN
12.0000 [IU] | Freq: Two times a day (BID) | SUBCUTANEOUS | Status: DC
  Administered 2013-01-25 – 2013-01-27 (×5): 12 [IU] via SUBCUTANEOUS
  Filled 2013-01-25 (×7): qty 0.12

## 2013-01-25 MED ORDER — INSULIN ASPART 100 UNIT/ML ~~LOC~~ SOLN
0.0000 [IU] | Freq: Three times a day (TID) | SUBCUTANEOUS | Status: DC
  Administered 2013-01-25: 20 [IU] via SUBCUTANEOUS
  Administered 2013-01-26 (×2): 4 [IU] via SUBCUTANEOUS
  Administered 2013-01-26: 7 [IU] via SUBCUTANEOUS
  Administered 2013-01-27 (×2): 4 [IU] via SUBCUTANEOUS

## 2013-01-25 MED ORDER — INSULIN ASPART 100 UNIT/ML ~~LOC~~ SOLN
0.0000 [IU] | Freq: Every day | SUBCUTANEOUS | Status: DC
  Administered 2013-01-25 – 2013-01-26 (×2): 2 [IU] via SUBCUTANEOUS

## 2013-01-25 MED ORDER — FAMOTIDINE 20 MG PO TABS
20.0000 mg | ORAL_TABLET | Freq: Two times a day (BID) | ORAL | Status: DC
  Administered 2013-01-25 – 2013-01-27 (×5): 20 mg via ORAL
  Filled 2013-01-25 (×5): qty 1

## 2013-01-25 MED ORDER — LEVALBUTEROL HCL 0.63 MG/3ML IN NEBU
0.6300 mg | INHALATION_SOLUTION | Freq: Three times a day (TID) | RESPIRATORY_TRACT | Status: DC
  Administered 2013-01-25 (×2): 0.63 mg via RESPIRATORY_TRACT
  Filled 2013-01-25 (×2): qty 3

## 2013-01-25 NOTE — Progress Notes (Signed)
Pt returned back to floor. Complains of anxiety. Will address. See [facility]

## 2013-01-25 NOTE — Progress Notes (Signed)
Inpatient Diabetes Program Recommendations  AACE/ADA: New Consensus Statement on Inpatient Glycemic Control (2013)  Target Ranges:  Prepandial:   less than 140 mg/dL      Peak postprandial:   less than 180 mg/dL (1-2 hours)      Critically ill patients:  140 - 180 mg/dL   Results for KALONJI, ZURAWSKI (MRN [id number]) as of 01/25/2013 08:18  Ref. Range 01/25/2013 00:02 01/25/2013 07:36  Glucose-Capillary Latest Range: 70-99 mg/dL 629 (H) 528 (H)    Inpatient Diabetes Program Recommendations Correction (SSI): May want to consider increasing Novolog correction to Resistant scale while on steroids.  Note: Patient has a history of diabetes and takes Metformin 1000 mg BID at home for diabetes management.  Currently, patient is ordered to receive Novolog 0-15 units AC and Novolog 0-5 units HS for inpatient glycemic control.  Noted that patient is receiving Solumedrol 80 mg Q6H which is likely cause of hyperglycemia.  Patient was ordered Novolog correction on 6/23; however, he has not received any Novolog correction since it was ordered.  May want to consider increasing Novolog correction to resistant scale while on steroids.  Will continue to follow.  Thanks, Orlando Penner, RN, MSN, CCRN Diabetes Coordinator Inpatient Diabetes Program [phone]

## 2013-01-25 NOTE — Progress Notes (Addendum)
Subjective: The patient is sitting up in bed getting a nebulizer treatment. He has no chest pain this morning. He complains of chronic shortness of breath, no better, no worse compared to last night. He complains of chronic pain in his knees and legs from degenerative joint disease.  Objective: Vital signs in last 24 hours: Filed Vitals:   01/25/13 0523 01/25/13 0718 01/25/13 0806 01/25/13 0954  BP: 162/67   146/66  Pulse: 103  98   Temp: 97.7 F (36.5 C)     TempSrc: Oral     Resp: 20  18   Height:      Weight:      SpO2: 97% 96% 96%     Intake/Output Summary (Last 24 hours) at 01/25/13 1124 Last data filed at 01/25/13 0509  Gross per 24 hour  Intake    100 ml  Output    500 ml  Net   -400 ml    Weight change:   Physical exam: General: Small framed 72 year old Caucasian man sitting up in bed, in no acute distress.  Lungs/respiratory. He is getting a nebulizer treatment. He is slightly short of breath with speaking. Fine wheezes throughout all lung fields. Heart: S1, S2, with borderline tachycardia. No ectopy. Next line abdomen: Positive bowel sounds, soft, nontender, nondistended. Extremities: Pedal pulses barely palpable. Trace of pedal edema bilaterally. No acute hot red joints. Neurologic: He is alert and oriented x3.  Lab Results: Basic Metabolic Panel:  Recent Labs  16/10/96 1615 01/25/13 0514  NA 131* 131*  K 4.7 4.4  CL 95* 94*  CO2 22 22  GLUCOSE 93 299*  BUN 18 24*  CREATININE 1.12 1.05  CALCIUM 9.5 9.6   Liver Function Tests:  Recent Labs  01/24/13 1615 01/25/13 0514  AST 11 12  ALT 8 9  ALKPHOS 99 110  BILITOT 0.2* 0.2*  PROT 7.2 7.8  ALBUMIN 3.5 3.7   No results found for this basename: LIPASE, AMYLASE,  in the last 72 hours No results found for this basename: AMMONIA,  in the last 72 hours CBC:  Recent Labs  01/24/13 1615 01/25/13 0514  WBC 14.2* 12.9*  NEUTROABS 10.4*  --   HGB 11.1* 11.5*  HCT 32.5* 33.8*  MCV 84.4 84.7  PLT  255 251   Cardiac Enzymes:  Recent Labs  01/24/13 1615 01/24/13 2322 01/25/13 0514  TROPONINI <0.30 <0.30 <0.30   BNP:  Recent Labs  01/24/13 1615  PROBNP 110.5   D-Dimer: No results found for this basename: DDIMER,  in the last 72 hours CBG:  Recent Labs  01/25/13 0002 01/25/13 0736  GLUCAP 197* 356*   Hemoglobin A1C: No results found for this basename: HGBA1C,  in the last 72 hours Fasting Lipid Panel: No results found for this basename: CHOL, HDL, LDLCALC, TRIG, CHOLHDL, LDLDIRECT,  in the last 72 hours Thyroid Function Tests: No results found for this basename: TSH, T4TOTAL, FREET4, T3FREE, THYROIDAB,  in the last 72 hours Anemia Panel: No results found for this basename: VITAMINB12, FOLATE, FERRITIN, TIBC, IRON, RETICCTPCT,  in the last 72 hours Coagulation:  Recent Labs  01/24/13 1615  LABPROT 13.6  INR 1.05   Urine Drug Screen: Drugs of Abuse     Component Value Date/Time   LABOPIA NONE DETECTED 06/15/2012 1715   COCAINSCRNUR NONE DETECTED 06/15/2012 1715   LABBENZ NONE DETECTED 06/15/2012 1715   AMPHETMU NONE DETECTED 06/15/2012 1715   THCU NONE DETECTED 06/15/2012 1715   LABBARB NONE DETECTED 06/15/2012  1715    Alcohol Level: No results found for this basename: ETH,  in the last 72 hours Urinalysis: No results found for this basename: COLORURINE, APPERANCEUR, LABSPEC, PHURINE, GLUCOSEU, HGBUR, BILIRUBINUR, KETONESUR, PROTEINUR, UROBILINOGEN, NITRITE, LEUKOCYTESUR,  in the last 72 hours Misc. Labs:   Micro: No results found for this or any previous visit (from the past 240 hour(s)).  Studies/Results: Dg Chest 2 View  01/24/2013   *RADIOLOGY REPORT*  Clinical Data: Short of breath and chest pain  CHEST - 2 VIEW  Comparison: 08/29/2012  Findings: COPD with marked hyperinflation and hyperlucency of the lungs.  Negative for pneumonia.  Negative for heart failure or effusion.  No mass lesion.  IMPRESSION: Severe COPD and hyperinflation.  No acute  radiographic abnormality.   Original Report Authenticated By: Janeece Riggers, M.D.   Ct Head Wo Contrast  01/25/2013   *RADIOLOGY REPORT*  Clinical Data: Dizziness, syncope.  CT HEAD WITHOUT CONTRAST  Technique:  Contiguous axial images were obtained from the base of the skull through the vertex without contrast.  Comparison: 06/24/2005  Findings: There is atrophy and chronic small vessel disease changes. No acute intracranial abnormality.  Specifically, no hemorrhage, hydrocephalus, mass lesion, acute infarction, or significant intracranial injury.  No acute calvarial abnormality. Visualized paranasal sinuses and mastoids clear.  Orbital soft tissues unremarkable.  IMPRESSION: No acute intracranial abnormality.  Atrophy, chronic microvascular disease.   Original Report Authenticated By: Charlett Nose, M.D.   Ct Angio Chest Pe W/cm &/or Wo Cm  01/24/2013   *RADIOLOGY REPORT*  Clinical Data: Chest pain and short of breath  CT ANGIOGRAPHY CHEST  Technique:  Multidetector CT imaging of the chest using the standard protocol during bolus administration of intravenous contrast. Multiplanar reconstructed images including MIPs were obtained and reviewed to evaluate the vascular anatomy.  Contrast: OMNIPAQUE IOHEXOL 350 MG/ML SOLN  Comparison: Chest x-ray today.  CT chest 06/15/2012  Findings: Negative for pulmonary embolism.  Atherosclerotic aorta without aneurysm or dissection.  Heart size is within normal limits.  No pericardial effusion.  Coronary artery calcification.  Severe apical emphysema.  Negative for pneumonia.  Negative for mass or adenopathy.  No pleural effusion. Right middle lobe scarring is stable.  Compression fracture approximately T9 unchanged from prior CT.  No acute bony lesion.  IMPRESSION: COPD and emphysema.  Negative for pulmonary embolism or other acute abnormality.   Original Report Authenticated By: Janeece Riggers, M.D.    Medications:  Scheduled: . budesonide-formoterol  2 puff  Inhalation BID  . diltiazem  180 mg Oral Daily  . docusate sodium  100 mg Oral BID  . enoxaparin (LOVENOX) injection  40 mg Subcutaneous Q24H  . furosemide  20 mg Oral Daily  . insulin aspart  0-15 Units Subcutaneous TID WC  . insulin aspart  0-5 Units Subcutaneous QHS  . levalbuterol  0.63 mg Nebulization TID PC & HS  . levofloxacin (LEVAQUIN) IV  500 mg Intravenous QHS  . methylPREDNISolone (SOLU-MEDROL) injection  80 mg Intravenous Q6H  . nicotine  14 mg Transdermal Daily  . sodium chloride  3 mL Intravenous Q12H  . sodium chloride  3 mL Intravenous Q12H  . tamsulosin  0.4 mg Oral QPC supper  . thiamine  100 mg Oral Daily  . tiotropium  18 mcg Inhalation Daily   Continuous:  JXB:JYNWGN chloride, acetaminophen, acetaminophen, ALPRAZolam, benzonatate, levalbuterol, morphine injection, ondansetron (ZOFRAN) IV, ondansetron, oxyCODONE, sodium chloride, traZODone  Assessment: Principal Problem:   Chest pain at rest Active Problems:  COPD with exacerbation   Chronic respiratory failure with hypoxia   DM type 2 (diabetes mellitus, type 2)   HTN (hypertension)   DDD (degenerative disc disease)   Anxiety   Anemia   Hyponatremia   Paroxysmal atrial fibrillation   1. Chest pain at rest. Likely musculoskeletal from increase in work of breathing. Cardiac enzymes are negative x3. CT angiogram of his chest reveals no PE. Continue supportive treatment.  Oxygen-dependent COPD/emphysema with exacerbation. He has frequent hospitalizations for exacerbations. Continue oxygen, Solu-Medrol, Levaquin, and bronchodilators/nebulizers.  Acute on chronic hypoxic respiratory failure. Treatment as above.  Ongoing tobacco use. The patient was strongly advised to stop smoking completely. He mentions that he has almost stopped but still smokes a few cigarettes daily. Continue nicotine patch.  Paroxysmal atrial fibrillation. Currently stable. He is not on Coumadin. He is in normal sinus rhythm per my  exam.  Hypertension. His blood pressure is reasonable on diltiazem.  Type 2 diabetes mellitus. Currently uncontrolled, attributed to IV steroids. We'll make adjustments in insulin dosing.  Hyponatremia. He may have mild volume depletion.  Chronic anxiety. Continue when necessary alprazolam.  Chronic pain secondary to degenerative joint disease, primarily in his legs. Continue when necessary oxycodone.    Plan:  1. Will give gentle hydration with normal saline for 24 hours. We'll reassess his serum sodium in the morning. 2. Change sliding scale NovoLog to resistive scale. We'll add Levemir. 3. Prophylactic Pepcid while he's on IV Solu-Medrol. 4. Tobacco cessation counseling.    LOS: 1 day   Shaquayla Klimas 01/25/2013, 11:24 AM

## 2013-01-25 NOTE — Progress Notes (Signed)
Paged Dr. Sherrie Mustache with results of CXR. No new orders at this time. [name]

## 2013-01-25 NOTE — Evaluation (Signed)
Physical Therapy Evaluation Patient Details Name: [name] MRN: [id number] DOB: 07-03-1941 Today's Date: 01/25/2013 Time: 1010-1048 PT Time Calculation (min): 38 min  PT Assessment / Plan / Recommendation Clinical Impression  Pt was seen for evaluation.  He is well known to me from previous admissions.  Status is essentially unchanged...he lives alone and struggles to manage ADLs.  Previously he has refused ACLF.  At this time, he states that he would like to go to rehab with the [place] system and he says that this will allow him to transition into long term care.  If he goes home at d/c, I would recommend HHPT to resolve any new issues that have come up at home.    PT Assessment  All further PT needs can be met in the next venue of care    Follow Up Recommendations  Home health PT    Does the patient have the potential to tolerate intense rehabilitation      Barriers to Discharge        Equipment Recommendations  None recommended by PT    Recommendations for Other Services     Frequency      Precautions / Restrictions Precautions Precautions: Fall Restrictions Weight Bearing Restrictions: No   Pertinent Vitals/Pain       Mobility  Bed Mobility Bed Mobility: Supine to Sit;Sit to Supine Supine to Sit: 7: Independent;HOB flat Sit to Supine: 7: Independent;HOB flat Transfers Transfers: Sit to Stand;Stand to Sit Sit to Stand: 6: Modified independent (Device/Increase time);With upper extremity assist Stand to Sit: 6: Modified independent (Device/Increase time);With upper extremity assist Ambulation/Gait Ambulation/Gait Assistance: 6: Modified independent (Device/Increase time) Ambulation Distance (Feet): 400 Feet Assistive device: Rolling walker Gait Pattern: Left steppage General Gait Details: Pt states that his left knee occasionally buckles and he loses his balance backward.  He has 2 ankle/foot orthotics, but states that both are broken.  This would stabilize his  ankle and probably help to prevent knee instability Stairs: No Wheelchair Mobility Wheelchair Mobility: No    Exercises     PT Diagnosis: Abnormality of gait  PT Problem List: Decreased strength PT Treatment Interventions:     PT Goals    Visit Information  Last PT Received On: 01/25/13    Subjective Data  Subjective: I really would like to go to the [place] rehab center Patient Stated Goal: to get stronger   Prior Functioning  Home Living Lives With: Alone Available Help at Discharge: Friend(s);Available PRN/intermittently Type of Home: House Home Access: Stairs to enter [employer] of Steps: 2 Entrance Stairs-Rails: Right Home Layout: One level Bathroom Shower/Tub: Engineer, manufacturing systems: Standard Home Adaptive Equipment: Walker - rolling;Straight cane;Bedside commode/3-in-1;Wheelchair - manual;Shower chair without back Prior Function Level of Independence: Independent with assistive device(s) Able to Take Stairs?: Yes Driving: No Vocation: Retired Musician: No difficulties    Copywriter, advertising Arousal/Alertness: Awake/alert Behavior During Therapy: WFL for tasks assessed/performed Overall Cognitive Status: Within Functional Limits for tasks assessed    Extremity/Trunk Assessment Right Lower Extremity Assessment RLE ROM/Strength/Tone: Deficits RLE ROM/Strength/Tone Deficits: hip strength=3/5, quadriceps =4/5, ankle=3/5 RLE Sensation: WFL - Light Touch RLE Coordination: WFL - gross motor Left Lower Extremity Assessment LLE ROM/Strength/Tone: Deficits LLE ROM/Strength/Tone Deficits: hip = 3-/5, quadriceps=4/5, gastrocnemius=2/5, anterior and posterior tibialis=1/5 LLE Sensation: WFL - Light Touch Trunk Assessment Trunk Assessment: Normal   Balance Balance Balance Assessed: No  End of Session PT - End of Session Equipment Utilized During Treatment: Gait belt;Oxygen Activity Tolerance: Patient tolerated  treatment  well Patient left: in bed;with call bell/phone within reach Nurse Communication: Mobility status  GP     Konrad Penta 01/25/2013, 10:57 AM

## 2013-01-25 NOTE — Care Management Note (Signed)
    Page 1 of 2   01/27/2013     11:13:56 AM   CARE MANAGEMENT NOTE 01/27/2013  Patient:  [name], [name]   Account Number:  [id number]  Date Initiated:  01/25/2013  Documentation initiated by:  Rosemary Holms  Subjective/Objective Assessment:   Pt admitted with exacerbation of COPD. Has O2 at home. States he needs PT at home. Also needs RN. Previous client of [facility] and would like to use them again.     Action/Plan:   Anticipated DC Date:  01/27/2013   Anticipated DC Plan:  HOME W HOME HEALTH SERVICES      DC Planning Services  CM consult      Choice offered to / List presented to:     DME arranged  [facility]      DME agency  [facility]     HH arranged  HH-1 RN  HH-10 DISEASE MANAGEMENT  HH-2 PT      HH agency  [facility]   Status of service:  Completed, signed off Medicare Important Message given?  YES (If response is "NO", the following Medicare IM given date fields will be blank) Date Medicare IM given:  01/27/2013 Date Additional Medicare IM given:    Discharge Disposition:  HOME W HOME HEALTH SERVICES  Per UR Regulation:    If discussed at Long Length of Stay Meetings, dates discussed:    Comments:  01/27/13 Rosemary Holms RN BSN CM 11:00 Confirmed/arranged MD appts. Noted on pt's DC instructions and given to RN Morrie Sheldon. [facility] to bring portable O2 tank for DC. Also notified that pt was upset regarding his tank at home. RN assisting with [facility] transportation home. [facility] notified of DC.  01/25/13 Rosemary Holms RN BSN CM

## 2013-01-25 NOTE — Progress Notes (Signed)
Occupational Therapy Screen  OT orders received. Patient chart reviewed. Spoke with PT who performed evaluation this AM. Patient is performing at baseline with BADL. Patient is Independent-Modified Independent at home. Patient states that he struggles to perform ADL at home which has been a long going issue. Patient is interested in receiving rehab through the [place] system in the hopes of transitioning to long term care. No further acute OT needs at this time; will sign off.   Limmie Patricia, OTR/L,CBIS  01/25/13 11:36AM

## 2013-01-25 NOTE — Progress Notes (Signed)
UR Chart Review Completed  

## 2013-01-25 NOTE — Progress Notes (Signed)
Pt called me into the room, and states that he is having new, worsening left sided chest pain. VS were checked, BP 159/79, P 101, R 18,  O2 98% on 2L. Pt states that when the pain is sharp, it causes his arm to feel "paralyzed," he also states that he is having some tingling in his left leg. Pupils are equal and reactive. Grips are equal. STAT 12 lead EKG ordered per protocol. Notified Dr. Sherrie Mustache that this was performed. Order for STAT CXR received, and to notify her of results. Pt is making jokes at this time, and does not appear to be in any acute distress. Sheryn Bison

## 2013-01-25 NOTE — Progress Notes (Signed)
Pt leaving floor with NT to go to CT per MD request.

## 2013-01-26 DIAGNOSIS — J9610 Chronic respiratory failure, unspecified whether with hypoxia or hypercapnia: Secondary | ICD-10-CM

## 2013-01-26 DIAGNOSIS — R0902 Hypoxemia: Secondary | ICD-10-CM

## 2013-01-26 LAB — GLUCOSE, CAPILLARY
Glucose-Capillary: 239 mg/dL — ABNORMAL HIGH (ref 70–99)
Glucose-Capillary: 172 mg/dL — ABNORMAL HIGH (ref 70–99)

## 2013-01-26 LAB — CBC
HCT: 30.4 % — ABNORMAL LOW (ref 39.0–52.0)
Hemoglobin: 10.2 g/dL — ABNORMAL LOW (ref 13.0–17.0)
MCV: 84.2 fL (ref 78.0–100.0)
RDW: 14 % (ref 11.5–15.5)
WBC: 15.7 10*3/uL — ABNORMAL HIGH (ref 4.0–10.5)

## 2013-01-26 LAB — BASIC METABOLIC PANEL
BUN: 26 mg/dL — ABNORMAL HIGH (ref 6–23)
Chloride: 102 mEq/L (ref 96–112)
Creatinine, Ser: 1.17 mg/dL (ref 0.50–1.35)
GFR calc Af Amer: 71 mL/min — ABNORMAL LOW (ref >90)
Glucose, Bld: 169 mg/dL — ABNORMAL HIGH (ref 70–99)

## 2013-01-26 MED ORDER — METHYLPREDNISOLONE SODIUM SUCC 40 MG IJ SOLR
40.0000 mg | Freq: Two times a day (BID) | INTRAMUSCULAR | Status: DC
  Administered 2013-01-26 – 2013-01-27 (×2): 40 mg via INTRAVENOUS
  Filled 2013-01-26 (×2): qty 1

## 2013-01-26 MED ORDER — LEVALBUTEROL HCL 0.63 MG/3ML IN NEBU
0.6300 mg | INHALATION_SOLUTION | Freq: Three times a day (TID) | RESPIRATORY_TRACT | Status: DC
  Administered 2013-01-26 – 2013-01-27 (×5): 0.63 mg via RESPIRATORY_TRACT
  Filled 2013-01-26 (×5): qty 3

## 2013-01-26 NOTE — Progress Notes (Signed)
[facility] PROGRESS NOTE  GOTTI ALWIN ZOX:[id number] DOB: 1941/06/14 DOA: 01/24/2013 PCP: Redmond Baseman, MD  Assessment/Plan: 1. Acute on chronic respiratory failure secondary to COPD exacerbation: Improving. Wean steroids. Continue antibiotics, oxygen, nebulizers. 2. Chest pain at rest: Chronic issue, long-standing, constant. Likely musculoskeletal from increased work of breathing. Cardiac enzymes negative. CT angiogram of the chest negative for pulmonary embolism. No further evaluation suggested. Atypical. 3. Chronic respiratory failure: Oxygen requirement 2 L per minute chronically. Plan as above. 4. Cigarette smoker: Recommend cessation. 5. PTSD: Stable. 6. History of Alcohol abuse per chart 7. Diabetes mellitus type 2: Stable. Resume metformin on discharge. 8. Paroxysmal atrial fibrillation not on warfarin secondary to noncompliance. Currently in sinus rhythm. Continue diltiazem. 9. Chronic pain secondary to degenerative joint disease: Appears stable.   Saline lock IV  Decrease steroid dose and frequency  Likely home less than 48 hours  Code Status: DNR/DNI DVT prophylaxis: Lovenox Family Communication: None present Disposition Plan: Home when improved with home health RN disease management, physical therapy  Brendia Sacks, MD  [facility]  Pager [phone] If 7PM-7AM, please contact night-coverage at [email or web address], password [facility] 01/26/2013, 9:08 AM  LOS: 2 days   Clinical Summary: 72 year old man with history of COPD and ongoing cigarette smoking presented to the emergency department with history of chest pain for last 4-5 days prior to admission. Associated with nausea, vomiting, possible syncope. Because of difficulty breathing he increases home oxygen use. His symptomology was felt to be secondary to COPD And he was admitted f on chronic respiratory failure, chest painor COPD exacerbation with acute, Dizziness with syncope episodes but be secondary to  acute respiratory failure. Secondary to noncompliance  Consultants:   physical therapy: Home health physical therapy.  Procedures:  None  Antibiotics:  Levaquin 6/23 >>  HPI/Subjective: Patient complained of chest pain overnight, vitals were unremarkable. Remains afebrile, normotensive, minimal hypoxia on chronic 2 L per minute. Today he feels better although he still has significant cough and is still short of breath. He does recall this painlessly. He reports his chronic constant chest pain. Appetite is poor.  Objective: Filed Vitals:   01/25/13 2001 01/25/13 2007 01/26/13 0415 01/26/13 0719  BP:  114/84 130/56   Pulse:  85 84   Temp:  98.1 F (36.7 C) 97.5 F (36.4 C)   TempSrc:  Oral Oral   Resp:  18 18   Height:      Weight:      SpO2: 98% 99% 98% 96%    Intake/Output Summary (Last 24 hours) at 01/26/13 0908 Last data filed at 01/26/13 0839  Gross per 24 hour  Intake    720 ml  Output   2290 ml  Net  -1570 ml     Filed Weights   01/24/13 2335 01/25/13 0100  Weight: 67.9 kg (149 lb 11.1 oz) 67.9 kg (149 lb 11.1 oz)    Exam:   General: Appears calm and comfortable. Lying flat in bed.  Cardiovascular regular rate and rhythm. No murmur, rub, gallop. No lower extremity edema.  Telemetry sinus rhythm, no arrhythmias  Respiratory fair air movement, able to speak in full senses, no acute distress. Bilateral expiratory wheezes. No rhonchi or rales.  Abdomen soft, nontender, nondistended  Musculoskeletal bilateral lower extremities warm, dry, no edema. Feet appear unremarkable.  Data Reviewed:   capillary blood sugars labile.    sodium near normal. Remainder basic metabolic panel unremarkable.  Cardiac enzymes negative, BNP within normal limits.   Chronic normocytic anemia  appears stable. Leukocytosis noted, likely secondary to steroids.  Hemoglobin A1c 5.6, TSH normal.    chest x-ray showed severe hyperinflation. CT angiogram of the chest was  notable for COPD, no acute abnormalities noted. CT of the head was negative. Repeat chest x-ray negative.  EKG sinus rhythm, no acute changes.  Pending studies:   None  Scheduled Meds: . budesonide-formoterol  2 puff Inhalation BID  . diltiazem  180 mg Oral Daily  . docusate sodium  100 mg Oral BID  . enoxaparin (LOVENOX) injection  40 mg Subcutaneous Q24H  . famotidine  20 mg Oral BID  . furosemide  20 mg Oral Daily  . insulin aspart  0-20 Units Subcutaneous TID WC  . insulin aspart  0-5 Units Subcutaneous QHS  . insulin detemir  12 Units Subcutaneous BID  . levalbuterol  0.63 mg Nebulization TID  . levofloxacin (LEVAQUIN) IV  500 mg Intravenous QHS  . methylPREDNISolone (SOLU-MEDROL) injection  80 mg Intravenous Q6H  . nicotine  14 mg Transdermal Daily  . sodium chloride  3 mL Intravenous Q12H  . sodium chloride  3 mL Intravenous Q12H  . tamsulosin  0.4 mg Oral QPC supper  . thiamine  100 mg Oral Daily  . tiotropium  18 mcg Inhalation Daily   Continuous Infusions: . 0.9 % NaCl with KCl 20 mEq / L 60 mL/hr at 01/25/13 1234    Principal Problem:   Chest pain at rest Active Problems:   COPD with exacerbation   DM type 2 (diabetes mellitus, type 2)   HTN (hypertension)   DDD (degenerative disc disease)   Anxiety   Anemia   Hyponatremia   Chronic respiratory failure with hypoxia   Paroxysmal atrial fibrillation   Time spent 20 minutes

## 2013-01-27 DIAGNOSIS — F17200 Nicotine dependence, unspecified, uncomplicated: Secondary | ICD-10-CM

## 2013-01-27 LAB — GLUCOSE, CAPILLARY
Glucose-Capillary: 173 mg/dL — ABNORMAL HIGH (ref 70–99)
Glucose-Capillary: 181 mg/dL — ABNORMAL HIGH (ref 70–99)
Glucose-Capillary: 152 mg/dL — ABNORMAL HIGH (ref 70–99)
Glucose-Capillary: 181 mg/dL — ABNORMAL HIGH (ref 70–99)

## 2013-01-27 MED ORDER — PREDNISONE 10 MG PO TABS: ORAL_TABLET | ORAL | Status: DC

## 2013-01-27 MED ORDER — LEVOFLOXACIN 500 MG PO TABS: 500.0000 mg | ORAL_TABLET | Freq: Every day | ORAL | Status: DC

## 2013-01-27 MED ORDER — PREDNISONE 20 MG PO TABS: 40.0000 mg | ORAL_TABLET | Freq: Every day | ORAL | Status: DC

## 2013-01-27 NOTE — Progress Notes (Signed)
[facility] PROGRESS NOTE  FORNEY KLEINPETER EXB:[id number] DOB: [birth year] DOA: 01/24/2013 PCP: Josue Hector, MD  Assessment/Plan: 1. Acute on chronic respiratory failure secondary to COPD exacerbation: Acute component appears resolved. Change to oral steroids. Continue oxygen, nebulizers as an outpatient. Resume inhaled steroids, long-acting bronchodilator. 2. Chronic noncardiac chest pain: This is likely multifactorial including musculoskeletal from increased work of breathing, COPD, however patient reports predominantly in its from long-standing dysphagia which he is being evaluated for as an outpatient. Cardiac enzymes were negative, no further evaluation suggested. CT angiogram of the chest was negative for pulmonary embolism or acute process. 3. Chronic respiratory failure: Stable. Continue oxygen at 2 L per minute. 4. Cigarette smoker: Recommend cessation. 5. PTSD: Stable. 6. History of alcohol abuse: The patient reports this was a problem in the past. 7. Diabetes was type II: Stable. Resume metformin on discharge. 8. Paroxysmal atrial fibrillation: Not on warfarin secondary to noncompliance. Remains in sinus rhythm. Telemetry today shows sinus rhythm with no arrhythmias. Continue diltiazem. 9. Chronic pain secondary to degenerative joint disease: Stable.   Change to oral steroids. Finish antibiotics as an outpatient. Resume inhaled steroid, long-acting bronchodilator.  Home today  Code Status: DNR/DNI DVT prophylaxis: Lovenox Family Communication: None present Disposition Plan: Home with home health RN disease management, physical therapy  Brendia Sacks, MD  [facility]  Pager [phone] If 7PM-7AM, please contact night-coverage at [email or web address], password [facility] 01/27/2013, 10:01 AM  LOS: 3 days   Clinical Summary: 72 year old man with history of COPD and ongoing cigarette smoking presented to the emergency department with history of chest pain for last 4-5  days prior to admission. Associated with nausea, vomiting, possible syncope. Because of difficulty breathing he increased use of his home oxygen. His symptomology was felt to be secondary to COPD and he was admitted for acute on chronic respiratory failure, chest pain, COPD exacerbation, dizziness with syncope.   Consultants:   physical therapy: Home health physical therapy.  Procedures:  None  Antibiotics:  Levaquin 6/23 >> 6/27  HPI/Subjective: Overall he feels better. Breathing okay. He reports chronic dysphagia for which he is set up to undergo outpatient evaluation in [place]. This chronic dysphagia causes chronic chest pain which is long-standing.  Objective: Filed Vitals:   01/26/13 2041 01/26/13 2107 01/27/13 0537 01/27/13 0902  BP: 173/70 129/64 136/61   Pulse: 91 82 68   Temp: 97.8 F (36.6 C)  97.7 F (36.5 C)   TempSrc: Oral  Oral   Resp: 24  18   Height:      Weight:      SpO2: 96%  98% 95%    Intake/Output Summary (Last 24 hours) at 01/27/13 1001 Last data filed at 01/27/13 0551  Gross per 24 hour  Intake    360 ml  Output    850 ml  Net   -490 ml     Filed Weights   01/24/13 2335 01/25/13 0100  Weight: 67.9 kg (149 lb 11.1 oz) 67.9 kg (149 lb 11.1 oz)    Exam:   General: Appears calm and comfortable, lying flat in bed.  Cardiovascular regular rate and rhythm. No significant lower extremity edema.  Respiratory clear to auscultation bilaterally. No wheezes, rales, rhonchi. Normal respiratory effort.  Psychiatric grossly normal mood and affect. Speech fluent and appropriate.  Data Reviewed:  No new data.  Pending studies:   None  Scheduled Meds: . diltiazem  180 mg Oral Daily  . docusate sodium  100 mg Oral BID  .  enoxaparin (LOVENOX) injection  40 mg Subcutaneous Q24H  . famotidine  20 mg Oral BID  . furosemide  20 mg Oral Daily  . insulin aspart  0-20 Units Subcutaneous TID WC  . insulin aspart  0-5 Units Subcutaneous QHS  .  insulin detemir  12 Units Subcutaneous BID  . levalbuterol  0.63 mg Nebulization TID  . levofloxacin (LEVAQUIN) IV  500 mg Intravenous QHS  . methylPREDNISolone (SOLU-MEDROL) injection  40 mg Intravenous Q12H  . nicotine  14 mg Transdermal Daily  . sodium chloride  3 mL Intravenous Q12H  . sodium chloride  3 mL Intravenous Q12H  . tamsulosin  0.4 mg Oral QPC supper  . thiamine  100 mg Oral Daily  . tiotropium  18 mcg Inhalation Daily   Continuous Infusions:    Principal Problem:   Chest pain at rest Active Problems:   COPD with exacerbation   DM type 2 (diabetes mellitus, type 2)   HTN (hypertension)   DDD (degenerative disc disease)   Anxiety   Anemia   Hyponatremia   Chronic respiratory failure with hypoxia   Paroxysmal atrial fibrillation

## 2013-01-27 NOTE — Progress Notes (Signed)
The nurse tech was taking pts VS while on the [facility] and the pts BP was 173/70. I felt it was not an accurate reading due to his previous BPs. I asked the tech to retake the pressure and it was 129/64. Pt is stable. Will continue to monitor.

## 2013-01-27 NOTE — Clinical Social Work Note (Signed)
CSW arranged transport via [facility], approved by administration. Verified address with pt. ED transport log filled out and form taken to 4th floor. Pt to be picked up at ED entrance. RN notified.   Derenda Fennel, [place] [phone]

## 2013-01-27 NOTE — Progress Notes (Signed)
01/27/13 1615 Patient left floor in stable condition via w/c accompanied by charge nurse this afternoon. Discharged home. Transported via [employer]. Morrie Sheldon Tanita Palinkas,RN

## 2013-01-27 NOTE — Progress Notes (Signed)
01/27/13 1245 Patient ambulated in hallway with nurse tech assist and front-wheel walker, approximately 100 feet. Tolerated fairly well, some general weakness. O2 sats 84% on r/a during ambulation. O2 sat 98% on r/a at rest after ambulation. Brandon Sheldon [place],RN

## 2013-01-27 NOTE — Progress Notes (Signed)
01/27/13 1104 Patient states no longer has portable O2 that works. Notified Case management of need for new portable O2 device for discharge home. Amy Leanord Hawking, case manager to notify [employer] of need for portable O2 device. Pt expressed concerns regarding transportation via [facility] for discharge. Notified Diannia Ruder, SW of concerns, stated would see patient. Earnstine Regal, RN

## 2013-01-27 NOTE — Discharge Summary (Signed)
Physician Discharge Summary  KIPP SHANK ZOX:[id number] DOB: 10/04/1940 DOA: 01/24/2013  PCP: Josue Hector, MD  Admit date: 01/24/2013 Discharge date: 01/27/2013  Recommendations for Outpatient Follow-up:  1. Resolution of COPD exacerbation 2. Continue to encourage smoking cessation   Follow-up Information   Follow up with Josue Hector, MD In 1 week.   Contact information:   [address] [facility] [address] [phone]      Discharge Diagnoses:  1. Acute on chronic respiratory failure secondary to COPD exacerbation 2. COPD exacerbation 3. Chronic noncardiac chest pain 4. Cigarette smoker 5. Diabetes mellitus type 2  Discharge Condition: Improved Disposition: Return home with home health physical therapy, RN for disease management  Diet recommendation: Diabetic diet  Filed Weights   01/24/13 2335 01/25/13 0100  Weight: 67.9 kg (149 lb 11.1 oz) 67.9 kg (149 lb 11.1 oz)    History of present illness:  72 year old man with history of COPD and ongoing cigarette smoking presented to the emergency department with history of chest pain for last 4-5 days prior to admission. Associated with nausea, vomiting, possible syncope. Because of difficulty breathing he increased use of his home oxygen. His symptomology was felt to be secondary to COPD and he was admitted for acute on chronic respiratory failure, chest pain, COPD exacerbation, dizziness with syncope.   Hospital Course:  Mr. Pullin was admitted for further treatment of COPD exacerbation. He rapidly improved with steroids, antibiotics, oxygen supplementation and nebulizer therapy and is now at baseline. It has been recommended he stop smoking. He was also observed for noncardiac chest pain, cardiac enzymes were negative. He reports his pain is constant and long-standing, he has associated it with long-standing dysphagia. He is currently set to undergo outpatient evaluation in [facility] for this issue. There  is also question of syncope, likely related to acute respiratory failure. Telemetry was unrevealing and no further evaluation was suggested. Individual issues as below.  1. Acute on chronic respiratory failure secondary to COPD exacerbation: Acute component appears resolved. Change to oral steroids. Continue oxygen, nebulizers as an outpatient. Resume inhaled steroids, long-acting bronchodilator. 2. Chronic noncardiac chest pain: This is likely multifactorial including musculoskeletal from increased work of breathing, COPD, however patient reports predominantly in its from long-standing dysphagia which he is being evaluated for as an outpatient. Cardiac enzymes were negative, no further evaluation suggested. CT angiogram of the chest was negative for pulmonary embolism or acute process. 3. Chronic respiratory failure: Stable. Continue oxygen at 2 L per minute. 4. Cigarette smoker: Recommend cessation. 5. PTSD: Stable. 6. History of alcohol abuse: The patient reports this was a problem in the past. 7. Diabetes was type II: Stable. Resume metformin on discharge. 8. Paroxysmal atrial fibrillation: Not on warfarin secondary to noncompliance. Remains in sinus rhythm. Telemetry today shows sinus rhythm with no arrhythmias. Continue diltiazem. 9. Chronic pain secondary to degenerative joint disease: Stable.  Discharge Instructions  Discharge Orders   Future Appointments Provider Department Dept Phone   01/28/2013 10:15 AM Barbaraann Share, MD [facility] [phone]   Future Orders Complete By Expires     Diet Carb Modified  As directed     Discharge instructions  As directed     Comments:      Be sure to finish antibiotics for COPD exacerbation. Be sure to complete prednisone taper to decrease inflammation in lungs. Call your physician or seek immediate medical attention for shortness of breath or worsening of your condition. The most important thing you can do for  your help is to stop  smoking immediately. Continued smoking will continue to worsen your lung function and puts you at increased risk for lung cancer.    Increase activity slowly  As directed         Medication List    STOP taking these medications       ciprofloxacin 500 MG tablet  Commonly known as:  CIPRO     HYDROcodone-acetaminophen 5-325 MG per tablet  Commonly known as:  NORCO/VICODIN     ipratropium 0.02 % nebulizer solution  Commonly known as:  ATROVENT      TAKE these medications       ALPRAZolam 0.25 MG tablet  Commonly known as:  XANAX  Take 1 tablet (0.25 mg total) by mouth 3 (three) times daily as needed for anxiety (air hunger, dyspnea).     alum & mag hydroxide-simeth 200-200-20 MG/5ML suspension  Commonly known as:  MAALOX/MYLANTA  Take 30 mLs by mouth as needed.     benzonatate 200 MG capsule  Commonly known as:  TESSALON  Take 1 capsule (200 mg total) by mouth 3 (three) times daily as needed for cough.     budesonide-formoterol 160-4.5 MCG/ACT inhaler  Commonly known as:  SYMBICORT  Inhale 2 puffs into the lungs 2 (two) times daily.     diltiazem 180 MG 24 hr capsule  Commonly known as:  CARDIZEM CD  Take 1 capsule (180 mg total) by mouth daily. For control of your heart rate.     furosemide 20 MG tablet  Commonly known as:  LASIX  Take 1 tablet (20 mg total) by mouth daily. For swelling in your feet and legs. Take potassium supplement with this medicine.     guaiFENesin 600 MG 12 hr tablet  Commonly known as:  MUCINEX  Take 2 tablets (1,200 mg total) by mouth 2 (two) times daily.     levofloxacin 500 MG tablet  Commonly known as:  LEVAQUIN  Take 1 tablet (500 mg total) by mouth at bedtime.     metFORMIN 1000 MG tablet  Commonly known as:  GLUCOPHAGE  Take 1,000 mg by mouth 2 (two) times daily.     oxyCODONE-acetaminophen 5-325 MG per tablet  Commonly known as:  PERCOCET/ROXICET  Take 1 tablet by mouth 2 (two) times daily.     potassium chloride SA 20 MEQ  tablet  Commonly known as:  K-DUR,KLOR-CON  Take 1 tablet (20 mEq total) by mouth daily. Take potassium supplement with furosemide (Lasix).     predniSONE 10 MG tablet  Commonly known as:  DELTASONE  Start 6/27. 6/27-6/29: Take 40 mg in the morning with breakfast daily. 6/30-7/2: Take 20 mg by mouth in the morning with breakfast. 7/3-7/5: Take 10 mg by mouth daily then stop.     albuterol (2.5 MG/3ML) 0.083% nebulizer solution  Commonly known as:  PROVENTIL  Take 3 mLs (2.5 mg total) by nebulization 4 (four) times daily.     PROVENTIL HFA 108 (90 BASE) MCG/ACT inhaler  Generic drug:  albuterol  Inhale 2 puffs into the lungs 4 (four) times daily as needed. For shortness of breath/ COPD     sennosides-docusate sodium 8.6-50 MG tablet  Commonly known as:  SENOKOT-S  Take 1-2 tablets by mouth daily. Take daily to avoid constipation.     sorbitol 70 % Soln  Take 30 mLs by mouth daily as needed.     tamsulosin 0.4 MG Caps  Commonly known as:  FLOMAX  Take 1  capsule (0.4 mg total) by mouth daily after supper.     tiotropium 18 MCG inhalation capsule  Commonly known as:  SPIRIVA  Place 18 mcg into inhaler and inhale daily.     traZODone 25 mg Tabs  Commonly known as:  DESYREL  Take 0.5 tablets (25 mg total) by mouth at bedtime as needed (insomnia).       Allergies  Allergen Reactions  . Aspirin Other (See Comments)    Stomach upset    The results of significant diagnostics from this hospitalization (including imaging, microbiology, ancillary and laboratory) are listed below for reference.    Significant Diagnostic Studies: Dg Chest 2 View  01/24/2013   *RADIOLOGY REPORT*  Clinical Data: Short of breath and chest pain  CHEST - 2 VIEW  Comparison: 08/29/2012  Findings: COPD with marked hyperinflation and hyperlucency of the lungs.  Negative for pneumonia.  Negative for heart failure or effusion.  No mass lesion.  IMPRESSION: Severe COPD and hyperinflation.  No acute radiographic  abnormality.   Original Report Authenticated By: Janeece Riggers, M.D.   Ct Head Wo Contrast  01/25/2013   *RADIOLOGY REPORT*  Clinical Data: Dizziness, syncope.  CT HEAD WITHOUT CONTRAST  Technique:  Contiguous axial images were obtained from the base of the skull through the vertex without contrast.  Comparison: 06/24/2005  Findings: There is atrophy and chronic small vessel disease changes. No acute intracranial abnormality.  Specifically, no hemorrhage, hydrocephalus, mass lesion, acute infarction, or significant intracranial injury.  No acute calvarial abnormality. Visualized paranasal sinuses and mastoids clear.  Orbital soft tissues unremarkable.  IMPRESSION: No acute intracranial abnormality.  Atrophy, chronic microvascular disease.   Original Report Authenticated By: Charlett Nose, M.D.   Ct Angio Chest Pe W/cm &/or Wo Cm  01/24/2013   *RADIOLOGY REPORT*  Clinical Data: Chest pain and short of breath  CT ANGIOGRAPHY CHEST  Technique:  Multidetector CT imaging of the chest using the standard protocol during bolus administration of intravenous contrast. Multiplanar reconstructed images including MIPs were obtained and reviewed to evaluate the vascular anatomy.  Contrast: OMNIPAQUE IOHEXOL 350 MG/ML SOLN  Comparison: Chest x-ray today.  CT chest 06/15/2012  Findings: Negative for pulmonary embolism.  Atherosclerotic aorta without aneurysm or dissection.  Heart size is within normal limits.  No pericardial effusion.  Coronary artery calcification.  Severe apical emphysema.  Negative for pneumonia.  Negative for mass or adenopathy.  No pleural effusion. Right middle lobe scarring is stable.  Compression fracture approximately T9 unchanged from prior CT.  No acute bony lesion.  IMPRESSION: COPD and emphysema.  Negative for pulmonary embolism or other acute abnormality.   Original Report Authenticated By: Janeece Riggers, M.D.   Dg Chest Port 1 View  01/25/2013   *RADIOLOGY REPORT*  Clinical Data: Severe  emphysema with shortness of breath and chest pain  PORTABLE CHEST - 1 VIEW  Comparison: 01/24/2013, chest x-ray and CT  Findings: Heart and mediastinal contours are stable.  The lung fields demonstrate some prominence of the interstitial markings diffusely and more pronounced at the lung bases compatible with underlying bronchitic change. This finding is unchanged.  No signs of congestive failure or new focal infiltrate are seen.  No pleural fluid or increase in peribronchial cuffing is identified. Mild increased density medially along the right cardiac border correlates with an area of stable right middle lobe scarring well demonstrated on recent CT.  IMPRESSION: Stable cardiopulmonary appearance with no new focal or acute abnormality identified   Original Report Authenticated  By: Rhodia Albright, M.D.    Labs: Basic Metabolic Panel:  Recent Labs Lab 01/24/13 1615 01/25/13 0514 01/26/13 0450  NA 131* 131* 134*  K 4.7 4.4 4.2  CL 95* 94* 102  CO2 22 22 21   GLUCOSE 93 299* 169*  BUN 18 24* 26*  CREATININE 1.12 1.05 1.17  CALCIUM 9.5 9.6 9.6   Liver Function Tests:  Recent Labs Lab 01/24/13 1615 01/25/13 0514  AST 11 12  ALT 8 9  ALKPHOS 99 110  BILITOT 0.2* 0.2*  PROT 7.2 7.8  ALBUMIN 3.5 3.7   CBC:  Recent Labs Lab 01/24/13 1615 01/25/13 0514 01/26/13 0450  WBC 14.2* 12.9* 15.7*  NEUTROABS 10.4*  --   --   HGB 11.1* 11.5* 10.2*  HCT 32.5* 33.8* 30.4*  MCV 84.4 84.7 84.2  PLT 255 251 243   Cardiac Enzymes:  Recent Labs Lab 01/24/13 1615 01/24/13 2322 01/25/13 0514 01/25/13 1120  TROPONINI <0.30 <0.30 <0.30 <0.30   BNP: BNP (last 3 results)  Recent Labs  08/19/12 0423 08/29/12 1043 01/24/13 1615  PROBNP 5740.0* 555.5* 110.5   CBG:  Recent Labs Lab 01/26/13 0737 01/26/13 1157 01/26/13 1635 01/26/13 2113 01/27/13 0750  GLUCAP 181* 239* 172* 216* 152*    Principal Problem:   Chest pain at rest Active Problems:   COPD with exacerbation   DM  type 2 (diabetes mellitus, type 2)   HTN (hypertension)   DDD (degenerative disc disease)   Anxiety   Anemia   Hyponatremia   Chronic respiratory failure with hypoxia   Paroxysmal atrial fibrillation   Time coordinating discharge: 25 minutes  Signed:  Brendia Sacks, MD [facility] 01/27/2013, 10:30 AM

## 2013-01-27 NOTE — Progress Notes (Signed)
01/27/13 1319 Reviewed discharge instructions with patient via teachback, copy of instructions, prescriptions, smoking cessation handouts, and f/u appointments given as scheduled. Pt verbalized understanding of instructions and when to call MD. States needs refills on some of his home medications but was advised per Dr Irene Limbo to have PCP refill them. States will arrange for his prescriptions to be picked up, did not need them called in to his pharmacy. Home Health set up for RN and PT per case management for advanced home care. O2 tank delivered per [facility] representative this afternoon for discharge home. Stated they will send representative to his home in the morning to check current O2 equipment. Notified CMT of telemetry d/c for discharge. Pt in stable condition, no c/o pain at this time. Awaiting [facility] transportation arrival for discharge home. Earnstine Regal, RN

## 2013-01-28 ENCOUNTER — Institutional Professional Consult (permissible substitution): Payer: Medicare Other | Admitting: Pulmonary Disease

## 2013-01-31 ENCOUNTER — Emergency Department (HOSPITAL_COMMUNITY): Payer: Medicare Other

## 2013-01-31 ENCOUNTER — Encounter (HOSPITAL_COMMUNITY): Payer: Self-pay | Admitting: Emergency Medicine

## 2013-01-31 ENCOUNTER — Observation Stay (HOSPITAL_COMMUNITY)
Admission: EM | Admit: 2013-01-31 | Discharge: 2013-02-01 | Disposition: A | Discharge status: Home / Self Care | Payer: Medicare Other | Attending: Family Medicine | Admitting: Family Medicine

## 2013-01-31 ENCOUNTER — Other Ambulatory Visit: Payer: Self-pay

## 2013-01-31 DIAGNOSIS — R45851 Suicidal ideations: Secondary | ICD-10-CM

## 2013-01-31 DIAGNOSIS — N400 Enlarged prostate without lower urinary tract symptoms: Secondary | ICD-10-CM

## 2013-01-31 DIAGNOSIS — F419 Anxiety disorder, unspecified: Secondary | ICD-10-CM

## 2013-01-31 DIAGNOSIS — I2781 Cor pulmonale (chronic): Secondary | ICD-10-CM

## 2013-01-31 DIAGNOSIS — J9611 Chronic respiratory failure with hypoxia: Secondary | ICD-10-CM

## 2013-01-31 DIAGNOSIS — I480 Paroxysmal atrial fibrillation: Secondary | ICD-10-CM

## 2013-01-31 DIAGNOSIS — F329 Major depressive disorder, single episode, unspecified: Secondary | ICD-10-CM

## 2013-01-31 DIAGNOSIS — D696 Thrombocytopenia, unspecified: Secondary | ICD-10-CM

## 2013-01-31 DIAGNOSIS — R5381 Other malaise: Secondary | ICD-10-CM

## 2013-01-31 DIAGNOSIS — R079 Chest pain, unspecified: Principal | ICD-10-CM | POA: Diagnosis present

## 2013-01-31 DIAGNOSIS — E871 Hypo-osmolality and hyponatremia: Secondary | ICD-10-CM

## 2013-01-31 DIAGNOSIS — K224 Dyskinesia of esophagus: Secondary | ICD-10-CM | POA: Diagnosis present

## 2013-01-31 DIAGNOSIS — D72829 Elevated white blood cell count, unspecified: Secondary | ICD-10-CM | POA: Diagnosis present

## 2013-01-31 DIAGNOSIS — J9610 Chronic respiratory failure, unspecified whether with hypoxia or hypercapnia: Secondary | ICD-10-CM | POA: Insufficient documentation

## 2013-01-31 DIAGNOSIS — R600 Localized edema: Secondary | ICD-10-CM

## 2013-01-31 DIAGNOSIS — J438 Other emphysema: Secondary | ICD-10-CM | POA: Insufficient documentation

## 2013-01-31 DIAGNOSIS — F4310 Post-traumatic stress disorder, unspecified: Secondary | ICD-10-CM | POA: Diagnosis present

## 2013-01-31 DIAGNOSIS — Z720 Tobacco use: Secondary | ICD-10-CM

## 2013-01-31 DIAGNOSIS — K56600 Partial intestinal obstruction, unspecified as to cause: Secondary | ICD-10-CM

## 2013-01-31 DIAGNOSIS — J441 Chronic obstructive pulmonary disease with (acute) exacerbation: Secondary | ICD-10-CM

## 2013-01-31 DIAGNOSIS — E1149 Type 2 diabetes mellitus with other diabetic neurological complication: Secondary | ICD-10-CM | POA: Insufficient documentation

## 2013-01-31 DIAGNOSIS — F17200 Nicotine dependence, unspecified, uncomplicated: Secondary | ICD-10-CM | POA: Insufficient documentation

## 2013-01-31 DIAGNOSIS — J9620 Acute and chronic respiratory failure, unspecified whether with hypoxia or hypercapnia: Secondary | ICD-10-CM

## 2013-01-31 DIAGNOSIS — IMO0002 Reserved for concepts with insufficient information to code with codable children: Secondary | ICD-10-CM

## 2013-01-31 DIAGNOSIS — R0602 Shortness of breath: Secondary | ICD-10-CM | POA: Insufficient documentation

## 2013-01-31 DIAGNOSIS — Z79899 Other long term (current) drug therapy: Secondary | ICD-10-CM | POA: Insufficient documentation

## 2013-01-31 DIAGNOSIS — D649 Anemia, unspecified: Secondary | ICD-10-CM | POA: Diagnosis present

## 2013-01-31 DIAGNOSIS — I10 Essential (primary) hypertension: Secondary | ICD-10-CM

## 2013-01-31 DIAGNOSIS — R112 Nausea with vomiting, unspecified: Secondary | ICD-10-CM

## 2013-01-31 DIAGNOSIS — E1142 Type 2 diabetes mellitus with diabetic polyneuropathy: Secondary | ICD-10-CM | POA: Insufficient documentation

## 2013-01-31 DIAGNOSIS — E119 Type 2 diabetes mellitus without complications: Secondary | ICD-10-CM | POA: Diagnosis present

## 2013-01-31 LAB — URINALYSIS, ROUTINE W REFLEX MICROSCOPIC
Ketones, ur: NEGATIVE mg/dL
Protein, ur: NEGATIVE mg/dL
Urobilinogen, UA: 0.2 mg/dL (ref 0.0–1.0)

## 2013-01-31 LAB — URINE MICROSCOPIC-ADD ON

## 2013-01-31 LAB — BASIC METABOLIC PANEL
BUN: 18 mg/dL (ref 6–23)
Calcium: 9 mg/dL (ref 8.4–10.5)
GFR calc Af Amer: >90 mL/min (ref >90)
GFR calc non Af Amer: 86 mL/min — ABNORMAL LOW (ref >90)
Potassium: 3.9 mEq/L (ref 3.5–5.1)
Sodium: 136 mEq/L (ref 135–145)

## 2013-01-31 LAB — CBC WITH DIFFERENTIAL/PLATELET
Basophils Absolute: 0 10*3/uL (ref 0.0–0.1)
Eosinophils Absolute: 0 10*3/uL (ref 0.0–0.7)
Lymphocytes Relative: 10 % — ABNORMAL LOW (ref 12–46)
Monocytes Relative: 6 % (ref 3–12)
Platelets: 250 10*3/uL (ref 150–400)
RDW: 14.1 % (ref 11.5–15.5)
WBC: 20.2 10*3/uL — ABNORMAL HIGH (ref 4.0–10.5)

## 2013-01-31 LAB — TROPONIN I: Troponin I: <0.3 ng/mL (ref <0.30)

## 2013-01-31 LAB — HEPATIC FUNCTION PANEL
AST: 10 U/L (ref 0–37)
Albumin: 3 g/dL — ABNORMAL LOW (ref 3.5–5.2)
Total Protein: 6.4 g/dL (ref 6.0–8.3)

## 2013-01-31 LAB — GLUCOSE, CAPILLARY
Glucose-Capillary: 99 mg/dL (ref 70–99)
Glucose-Capillary: 101 mg/dL — ABNORMAL HIGH (ref 70–99)

## 2013-01-31 LAB — MAGNESIUM: Magnesium: 2 mg/dL (ref 1.5–2.5)

## 2013-01-31 MED ORDER — SORBITOL 70 % SOLN: 30.0000 mL | Freq: Every day | Status: DC | PRN

## 2013-01-31 MED ORDER — HYPROMELLOSE (GONIOSCOPIC) 2.5 % OP SOLN: 1.0000 [drp] | Freq: Every day | OPHTHALMIC | Status: DC | PRN

## 2013-01-31 MED ORDER — SODIUM CHLORIDE 0.9 % IV SOLN: INTRAVENOUS | Status: DC

## 2013-01-31 MED ORDER — TIOTROPIUM BROMIDE MONOHYDRATE 18 MCG IN CAPS
18.0000 ug | ORAL_CAPSULE | Freq: Every day | RESPIRATORY_TRACT | Status: DC
  Administered 2013-02-01: 18 ug via RESPIRATORY_TRACT
  Filled 2013-01-31: qty 5

## 2013-01-31 MED ORDER — GUAIFENESIN ER 600 MG PO TB12
1200.0000 mg | ORAL_TABLET | Freq: Two times a day (BID) | ORAL | Status: DC
  Administered 2013-01-31 – 2013-02-01 (×2): 1200 mg via ORAL
  Filled 2013-01-31 (×2): qty 2

## 2013-01-31 MED ORDER — PANTOPRAZOLE SODIUM 40 MG PO TBEC
80.0000 mg | DELAYED_RELEASE_TABLET | Freq: Once | ORAL | Status: AC
  Administered 2013-01-31: 80 mg via ORAL
  Filled 2013-01-31: qty 2

## 2013-01-31 MED ORDER — ALBUTEROL SULFATE (5 MG/ML) 0.5% IN NEBU: 2.5000 mg | INHALATION_SOLUTION | Freq: Four times a day (QID) | RESPIRATORY_TRACT | Status: DC | PRN

## 2013-01-31 MED ORDER — OXYCODONE-ACETAMINOPHEN 5-325 MG PO TABS
1.0000 | ORAL_TABLET | Freq: Two times a day (BID) | ORAL | Status: DC
  Administered 2013-01-31 – 2013-02-01 (×2): 1 via ORAL
  Filled 2013-01-31 (×3): qty 1

## 2013-01-31 MED ORDER — TAMSULOSIN HCL 0.4 MG PO CAPS
0.4000 mg | ORAL_CAPSULE | Freq: Every day | ORAL | Status: DC
  Administered 2013-01-31: 0.4 mg via ORAL
  Filled 2013-01-31: qty 1

## 2013-01-31 MED ORDER — ENOXAPARIN SODIUM 40 MG/0.4ML ~~LOC~~ SOLN
40.0000 mg | SUBCUTANEOUS | Status: DC
  Administered 2013-01-31: 40 mg via SUBCUTANEOUS
  Filled 2013-01-31: qty 0.4

## 2013-01-31 MED ORDER — POLYVINYL ALCOHOL 1.4 % OP SOLN: 1.0000 [drp] | Freq: Every day | OPHTHALMIC | Status: DC | PRN

## 2013-01-31 MED ORDER — SENNOSIDES-DOCUSATE SODIUM 8.6-50 MG PO TABS
1.0000 | ORAL_TABLET | Freq: Every day | ORAL | Status: DC
  Administered 2013-01-31 – 2013-02-01 (×2): 1 via ORAL
  Filled 2013-01-31 (×2): qty 1

## 2013-01-31 MED ORDER — CIPROFLOXACIN HCL 250 MG PO TABS
500.0000 mg | ORAL_TABLET | Freq: Two times a day (BID) | ORAL | Status: DC
  Administered 2013-01-31 – 2013-02-01 (×2): 500 mg via ORAL
  Filled 2013-01-31 (×2): qty 2

## 2013-01-31 MED ORDER — FUROSEMIDE 20 MG PO TABS
20.0000 mg | ORAL_TABLET | Freq: Every day | ORAL | Status: DC
  Administered 2013-01-31 – 2013-02-01 (×2): 20 mg via ORAL
  Filled 2013-01-31 (×2): qty 1

## 2013-01-31 MED ORDER — NICOTINE 14 MG/24HR TD PT24: 14.0000 mg | MEDICATED_PATCH | Freq: Every day | TRANSDERMAL | Status: DC | PRN

## 2013-01-31 MED ORDER — PANTOPRAZOLE SODIUM 40 MG PO TBEC
40.0000 mg | DELAYED_RELEASE_TABLET | Freq: Two times a day (BID) | ORAL | Status: DC
  Administered 2013-02-01: 40 mg via ORAL
  Filled 2013-01-31 (×2): qty 1

## 2013-01-31 MED ORDER — TRAZODONE HCL 50 MG PO TABS: 50.0000 mg | ORAL_TABLET | Freq: Every evening | ORAL | Status: DC | PRN

## 2013-01-31 MED ORDER — ONDANSETRON HCL 4 MG/2ML IJ SOLN: 4.0000 mg | INTRAMUSCULAR | Status: DC | PRN

## 2013-01-31 MED ORDER — INSULIN ASPART 100 UNIT/ML ~~LOC~~ SOLN: 0.0000 [IU] | Freq: Every day | SUBCUTANEOUS | Status: DC

## 2013-01-31 MED ORDER — ACETAMINOPHEN 500 MG PO TABS
500.0000 mg | ORAL_TABLET | Freq: Four times a day (QID) | ORAL | Status: DC | PRN
  Administered 2013-02-01: 500 mg via ORAL
  Filled 2013-01-31: qty 1

## 2013-01-31 MED ORDER — PREDNISONE 20 MG PO TABS
20.0000 mg | ORAL_TABLET | Freq: Every day | ORAL | Status: DC
  Administered 2013-02-01: 20 mg via ORAL
  Filled 2013-01-31: qty 1

## 2013-01-31 MED ORDER — METFORMIN HCL 500 MG PO TABS
1000.0000 mg | ORAL_TABLET | Freq: Two times a day (BID) | ORAL | Status: DC
  Administered 2013-01-31 – 2013-02-01 (×2): 1000 mg via ORAL
  Filled 2013-01-31 (×2): qty 2

## 2013-01-31 MED ORDER — BUDESONIDE-FORMOTEROL FUMARATE 160-4.5 MCG/ACT IN AERO
2.0000 | INHALATION_SPRAY | Freq: Two times a day (BID) | RESPIRATORY_TRACT | Status: DC
  Administered 2013-02-01: 2 via RESPIRATORY_TRACT
  Filled 2013-01-31: qty 6

## 2013-01-31 MED ORDER — ALPRAZOLAM 0.5 MG PO TABS
0.2500 mg | ORAL_TABLET | Freq: Once | ORAL | Status: AC
  Administered 2013-01-31: 0.25 mg via ORAL
  Filled 2013-01-31: qty 1

## 2013-01-31 MED ORDER — INSULIN ASPART 100 UNIT/ML ~~LOC~~ SOLN: 0.0000 [IU] | Freq: Three times a day (TID) | SUBCUTANEOUS | Status: DC

## 2013-01-31 MED ORDER — ALPRAZOLAM 0.25 MG PO TABS
0.2500 mg | ORAL_TABLET | Freq: Three times a day (TID) | ORAL | Status: DC | PRN
  Administered 2013-02-01 (×2): 0.25 mg via ORAL
  Filled 2013-01-31 (×2): qty 1

## 2013-01-31 MED ORDER — POTASSIUM CHLORIDE CRYS ER 20 MEQ PO TBCR
20.0000 meq | EXTENDED_RELEASE_TABLET | Freq: Every day | ORAL | Status: DC
  Administered 2013-01-31 – 2013-02-01 (×2): 20 meq via ORAL
  Filled 2013-01-31 (×2): qty 1

## 2013-01-31 MED ORDER — DILTIAZEM HCL ER COATED BEADS 180 MG PO CP24
180.0000 mg | ORAL_CAPSULE | Freq: Every day | ORAL | Status: DC
  Administered 2013-02-01: 180 mg via ORAL
  Filled 2013-01-31: qty 1

## 2013-01-31 NOTE — ED Notes (Signed)
Meal given to pt.

## 2013-01-31 NOTE — H&P (Signed)
[facility] History and Physical  [name]  ZOX:[id number]  DOB: 1940-09-27   DOA: 01/31/2013   PCP:   Josue Hector, MD   Chief Complaint:  Chest pain since discharge  HPI: [name] is a 72 y.o. male.   Elderly Caucasian gentleman a veteran of the [place] War, diagnosed with posttraumatic stress disorder, discharge from the hospital 4 days ago after treatment for COPD exacerbation and another episode of recurrence of noncardiac chest pain.   Patient returned to the emergency room this evening complaining of worsening chest pain and that it has never resolved and he is tired of being given the run around.  He was noted at his previous admission to have an esophageal dysmotility disorder, and has an appointment to whether Crissie Reese at [facility] July 8.  He does report certain foods especially spicy foods upset his stomach  Patient reports that he really has no new symptoms since his recent discharge, and nothing has changed except that the pain is worse; he does not seem to be taking a proton pump inhibitor.  He does have a white count elevated above his discharge level, and a mild left shift is noted  He lives alone. He has not used alcohol for months  Rewiew of Systems:  Unchanged from recent visit   Past Medical History  Diagnosis Date  . Essential hypertension, benign   . Alcohol abuse   . PTSD (post-traumatic stress disorder)   . Chronic back pain   . Depression   . Peripheral neuropathy   . DJD (degenerative joint disease)   . COPD (chronic obstructive pulmonary disease)   . Anemia   . Myocardial infarction   . Type II or unspecified type diabetes mellitus without mention of complication, not stated as uncontrolled   . Esophageal dysmotility 07/04/2011  . Chronic respiratory failure with hypoxia   . Anxiety   . Paroxysmal atrial fibrillation 08/29/2012  . Partial small bowel obstruction 09/02/2012  . Bilateral lower  extremity edema 09/03/2012    Possibly secondary to calcium channel blocker.    Past Surgical History  Procedure Laterality Date  . Hernia repair      X 3  . Spinal surgeries      X 4  . Appendectomy    . Prostate surgery      Medications:  HOME MEDS: Prior to Admission medications   Medication Sig Start Date End Date Taking? Authorizing Provider  albuterol (PROVENTIL HFA;VENTOLIN HFA) 108 (90 BASE) MCG/ACT inhaler Inhale 2 puffs into the lungs every 6 (six) hours as needed for wheezing.   Yes Historical Provider, MD  albuterol (PROVENTIL) (2.5 MG/3ML) 0.083% nebulizer solution Take 3 mLs (2.5 mg total) by nebulization 4 (four) times daily. 09/04/12  Yes Elliot Cousin, MD  ALPRAZolam Prudy Feeler) 0.25 MG tablet Take 0.25 mg by mouth 3 (three) times daily as needed for anxiety. 09/04/12  Yes Elliot Cousin, MD  budesonide-formoterol [facility]) 160-4.5 MCG/ACT inhaler Inhale 2 puffs into the lungs 2 (two) times daily. 09/04/12 11/17/13 Yes Elliot Cousin, MD  ciprofloxacin (CIPRO) 500 MG tablet Take 500 mg by mouth 2 (two) times daily.   Yes Historical Provider, MD  diltiazem (CARDIZEM CD) 180 MG 24 hr capsule Take 1 capsule (180 mg total) by mouth daily. For control of your heart rate. 09/04/12  Yes Elliot Cousin, MD  furosemide (LASIX) 20 MG tablet Take 20 mg by mouth daily. For swelling in your feet and legs. Take potassium supplement with  this medicine. 09/04/12  Yes Elliot Cousin, MD  guaiFENesin (MUCINEX) 600 MG 12 hr tablet Take 2 tablets (1,200 mg total) by mouth 2 (two) times daily. 08/24/12  Yes Rhetta Mura, MD  hydroxypropyl methylcellulose (ISOPTO TEARS) 2.5 % ophthalmic solution Place 1 drop into both eyes daily as needed ([facility]).   Yes Historical Provider, MD  metFORMIN (GLUCOPHAGE) 1000 MG tablet Take 1,000 mg by mouth 2 (two) times daily.   Yes Historical Provider, MD  oxyCODONE-acetaminophen (PERCOCET/ROXICET) 5-325 MG per tablet Take 1 tablet by mouth 2 (two) times daily. 01/04/13   Yes Historical Provider, MD  potassium chloride (K-DUR,KLOR-CON) 20 MEQ tablet Take 1 tablet (20 mEq total) by mouth daily. Take potassium supplement with furosemide (Lasix). 09/04/12  Yes Elliot Cousin, MD  predniSONE (DELTASONE) 10 MG tablet Start 6/27. 6/27-6/29: Take 40 mg in the morning with breakfast daily. 6/30-7/2: Take 20 mg by mouth in the morning with breakfast. 7/3-7/5: Take 10 mg by mouth daily then stop. 01/27/13  Yes Standley Brooking, MD  sennosides-docusate sodium (SENOKOT-S) 8.6-50 MG tablet Take 1-2 tablets by mouth daily. Take daily to avoid constipation. 09/04/12  Yes Elliot Cousin, MD  sorbitol 70 % SOLN Take 30 mLs by mouth daily as needed. 08/24/12  Yes Rhetta Mura, MD  Tamsulosin HCl (FLOMAX) 0.4 MG CAPS Take 1 capsule (0.4 mg total) by mouth daily after supper. 08/24/12  Yes Rhetta Mura, MD  tiotropium (SPIRIVA) 18 MCG inhalation capsule Place 18 mcg into inhaler and inhale daily.     Yes Historical Provider, MD  traZODone (DESYREL) 25 mg TABS Take 0.5 tablets (25 mg total) by mouth at bedtime as needed (insomnia). 09/04/12  Yes Elliot Cousin, MD     Allergies:  Allergies  Allergen Reactions  . Aspirin Other (See Comments)    Stomach upset    Social History:   reports that he has been smoking Cigarettes.  He has been smoking about 0.50 packs per day. He uses smokeless tobacco. He reports that  drinks alcohol. He reports that he does not use illicit drugs.  Family History: Family History  Problem Relation Age of Onset  . Lung disease Mother   . Prostate cancer Father      Physical Exam: Filed Vitals:   01/31/13 1524 01/31/13 1630 01/31/13 1836 01/31/13 1905  BP: 109/58 112/71 124/44 92/66  Pulse: 87 85 76 84  Temp:    97.3 F (36.3 C)  TempSrc:    Oral  Resp: 21 18 20 20   Weight:    66.9 kg (147 lb 7.8 oz)  SpO2: 100% 99% 98% 99%   Blood pressure 92/66, pulse 84, temperature 97.3 F (36.3 C), temperature source Oral, resp. rate 20, weight 66.9  kg (147 lb 7.8 oz), SpO2 99.00%. Body mass index is 25.3 kg/(m^2).   GEN:  Pleasant but initially irritable elderly Caucasian lying bed in no acute distress; cooperative with exam PSYCH:  alert and oriented x4;  somewhat anxious; affect is appropriate. HEENT: Mucous membranes pink and anicteric; PERRLA; EOM intact; no cervical lymphadenopathy nor thyromegaly or carotid bruit; no JVD; Breasts:: Not examined CHEST WALL: No tenderness CHEST: Normal respiration, occasional HEART: Regular rate and rhythm; no murmurs rubs or gallops BACK: Mild kyphosis no scoliosis; no CVA tenderness ABDOMEN: Obese, soft non-tender; no masses, no organomegaly, normal abdominal bowel sounds; no pannus; no intertriginous candida. Rectal Exam: Not done EXTREMITIES:  age-appropriate arthropathy of the hands and knees; no edema; no ulcerations. Genitalia: not examined PULSES: 2+ and  symmetric SKIN: Normal hydration no rash or ulceration CNS: Cranial nerves 2-12 grossly intact no focal lateralizing neurologic deficit   Labs on Admission:  Basic Metabolic Panel:  Recent Labs Lab 01/25/13 0514 01/26/13 0450 01/31/13 1313  NA 131* 134* 136  K 4.4 4.2 3.9  CL 94* 102 97  CO2 22 21 27   GLUCOSE 299* 169* 107*  BUN 24* 26* 18  CREATININE 1.05 1.17 0.85  CALCIUM 9.6 9.6 9.0   Liver Function Tests:  Recent Labs Lab 01/25/13 0514  AST 12  ALT 9  ALKPHOS 110  BILITOT 0.2*  PROT 7.8  ALBUMIN 3.7   No results found for this basename: LIPASE, AMYLASE,  in the last 168 hours No results found for this basename: AMMONIA,  in the last 168 hours CBC:  Recent Labs Lab 01/25/13 0514 01/26/13 0450 01/31/13 1313  WBC 12.9* 15.7* 20.2*  NEUTROABS  --   --  17.0*  HGB 11.5* 10.2* 10.5*  HCT 33.8* 30.4* 31.4*  MCV 84.7 84.2 85.1  PLT 251 243 250   Cardiac Enzymes:  Recent Labs Lab 01/24/13 2322 01/25/13 0514 01/25/13 1120 01/31/13 1313 01/31/13 1656  TROPONINI <0.30 <0.30 <0.30 <0.30 <0.30    BNP: No components found with this basename: POCBNP,  D-dimer: No components found with this basename: D-DIMER,  CBG:  Recent Labs Lab 01/26/13 1635 01/26/13 2113 01/27/13 0750 01/27/13 1128 01/31/13 1307  GLUCAP 172* 216* 152* 181* 99    Radiological Exams on Admission: Ct Head Wo Contrast  01/31/2013   *RADIOLOGY REPORT*  Clinical Data: Weakness and dizziness  CT HEAD WITHOUT CONTRAST  Technique:  Contiguous axial images were obtained from the base of the skull through the vertex without contrast.  Comparison: Prior CT from 01/25/2013  Findings: There is no acute intracranial hemorrhage or infarct.  No mass or midline shift.  There is mild prominence of the CSF containing spaces, consistent with atrophy, unchanged. Hypoattenuation within the periventricular white matter is consistent with chronic microvascular disease.  No extra-axial fluid collection.  Calvarium is intact.  IMPRESSION: Mild atrophy with chronic small vessel ischemic changes.  No acute intracranial process.   Original Report Authenticated By: Rise Mu, M.D.   Dg Chest Portable 1 View  01/31/2013   *RADIOLOGY REPORT*  Clinical Data: 72 year old male chest pain difficulty breathing.  PORTABLE CHEST - 1 VIEW  Comparison: 01/25/2013 and earlier.  Findings: AP portable upright view at 1315 hours.  Stable lung volumes.  Stable cardiac size and mediastinal contours.  No pneumothorax, pulmonary edema or consolidation.  Continued blunting left costophrenic angle.  Streaky bibasilar opacity not significantly changed.  IMPRESSION: Stable. No acute cardiopulmonary abnormality.   Original Report Authenticated By: Erskine Speed, M.D.      Assessment/Plan  Principal Problem:   Chest pain Active Problems:   DM type 2 (diabetes mellitus, type 2)   Anemia   Esophageal dysmotility   Shortness of breath   PTSD (post-traumatic stress disorder)   Leukocytosis, unspecified     Motivation and readiness for weight loss  intervention Body mass index is 25.3 kg/(m^2). After asking permission to discuss his weight, he volunteers that he is overweight when is height is taken into consideration but has no plans to lose weight and does not consider it a priority. Does not feel strongly about weight loss one way or the other. More concerned with his unexplained chest pain.   PLAN: We'll admit this gentleman for empiric treatment of his chest pain; we'll presume  it is GI related and give high-dose Protonix.  Continue treatment of his chronic medical condition Left shift of his white cells that cannot usually be explained by margination, and this puzzling he does not have overt signs of infection. Will monitor this.   Other plans as per orders.  Code Status: Full Family Communication: Lungs discuss with patient Disposition Plan: Likely home in a day or 2    [name] Nocturnist [facility] Pager [phone]   01/31/2013, 7:45 PM

## 2013-01-31 NOTE — ED Notes (Signed)
Per Dr Deretha Emory pt able to eat

## 2013-01-31 NOTE — ED Provider Notes (Addendum)
History    This chart was scribed for Shelda Jakes, MD by Donne Anon, ED Scribe. This patient was seen in room APA02/APA02 and the patient's care was started at 1315.  CSN: [id number] Arrival date & time 01/31/13  1231  First MD Initiated Contact with Patient 01/31/13 1315     Chief Complaint  Patient presents with  . Chest Pain  . Fatigue    The history is provided by the patient. No language interpreter was used.   HPI Comments: [name] is a 72 y.o. male brought in by ambulance, who presents to the Emergency Department complaining of gradual onset, gradually worsening sub-sternal CP that radiates to his left chest and back and began 4 days ago and worsened 2 days ago. He rates his pain 10/10. He also reports SOB that has been constant and non-changing for 4 days.He reports associated nausea, vomiting, urine incontinence, decreased food intake and mild abdominal pain. He was admitted on 01/24/13 and discharged on 01/27/13 from the hospital for breathing problem and CHF. He uses 2.5 L of oxygen at home. He denies wheezing.  Dr. Naoma Diener is his PCP.  Past Medical History  Diagnosis Date  . Essential hypertension, benign   . Alcohol abuse   . PTSD (post-traumatic stress disorder)   . Chronic back pain   . Depression   . Peripheral neuropathy   . DJD (degenerative joint disease)   . COPD (chronic obstructive pulmonary disease)   . Anemia   . Myocardial infarction   . Type II or unspecified type diabetes mellitus without mention of complication, not stated as uncontrolled   . Esophageal dysmotility 07/04/2011  . Chronic respiratory failure with hypoxia   . Anxiety   . Paroxysmal atrial fibrillation 08/29/2012  . Partial small bowel obstruction 09/02/2012  . Bilateral lower extremity edema 09/03/2012    Possibly secondary to calcium channel blocker.   Past Surgical History  Procedure Laterality Date  . Hernia repair      X 3  . Spinal surgeries      X 4  .  Appendectomy    . Prostate surgery     Family History  Problem Relation Age of Onset  . Lung disease Mother   . Prostate cancer Father    History  Substance Use Topics  . Smoking status: Current Every Day Smoker -- 0.50 packs/day    Types: Cigarettes  . Smokeless tobacco: Current User  . Alcohol Use: Yes     Comment: Occasional    Review of Systems  Constitutional: Positive for fever, chills and appetite change.  Eyes: Positive for visual disturbance.  Respiratory: Positive for shortness of breath. Negative for wheezing.   Cardiovascular: Positive for leg swelling.  Gastrointestinal: Positive for diarrhea.  Genitourinary: Negative for hematuria.       + incontinence  Skin: Negative for rash.  Neurological: Positive for light-headedness.  Hematological: Bruises/bleeds easily.  Psychiatric/Behavioral: Negative for confusion.    Allergies  Aspirin  Home Medications   Current Outpatient Rx  Name  Route  Sig  Dispense  Refill  . albuterol (PROVENTIL HFA;VENTOLIN HFA) 108 (90 BASE) MCG/ACT inhaler   Inhalation   Inhale 2 puffs into the lungs every 6 (six) hours as needed for wheezing.         [name] albuterol (PROVENTIL) (2.5 MG/3ML) 0.083% nebulizer solution   Nebulization   Take 3 mLs (2.5 mg total) by nebulization 4 (four) times daily.   75 mL      .  ALPRAZolam (XANAX) 0.25 MG tablet   Oral   Take 0.25 mg by mouth 3 (three) times daily as needed for anxiety.         . budesonide-formoterol (SYMBICORT) 160-4.5 MCG/ACT inhaler   Inhalation   Inhale 2 puffs into the lungs 2 (two) times daily.   1 Inhaler   2   . ciprofloxacin (CIPRO) 500 MG tablet   Oral   Take 500 mg by mouth 2 (two) times daily.         [name] diltiazem (CARDIZEM CD) 180 MG 24 hr capsule   Oral   Take 1 capsule (180 mg total) by mouth daily. For control of your heart rate.   30 capsule   2   . furosemide (LASIX) 20 MG tablet   Oral   Take 20 mg by mouth daily. For swelling in your feet  and legs. Take potassium supplement with this medicine.         [name] guaiFENesin (MUCINEX) 600 MG 12 hr tablet   Oral   Take 2 tablets (1,200 mg total) by mouth 2 (two) times daily.   60 tablet   0   . hydroxypropyl methylcellulose (ISOPTO TEARS) 2.5 % ophthalmic solution   Both Eyes   Place 1 drop into both eyes daily as needed (Dry Eye).         . metFORMIN (GLUCOPHAGE) 1000 MG tablet   Oral   Take 1,000 mg by mouth 2 (two) times daily.         [name] oxyCODONE-acetaminophen (PERCOCET/ROXICET) 5-325 MG per tablet   Oral   Take 1 tablet by mouth 2 (two) times daily.         . potassium chloride (K-DUR,KLOR-CON) 20 MEQ tablet   Oral   Take 1 tablet (20 mEq total) by mouth daily. Take potassium supplement with furosemide (Lasix).   30 tablet   2   . predniSONE (DELTASONE) 10 MG tablet      Start 6/27. 6/27-6/29: Take 40 mg in the morning with breakfast daily. 6/30-7/2: Take 20 mg by mouth in the morning with breakfast. 7/3-7/5: Take 10 mg by mouth daily then stop.   21 tablet   0   . sennosides-docusate sodium (SENOKOT-S) 8.6-50 MG tablet   Oral   Take 1-2 tablets by mouth daily. Take daily to avoid constipation.         . sorbitol 70 % SOLN   Oral   Take 30 mLs by mouth daily as needed.   500 mL   0   . Tamsulosin HCl (FLOMAX) 0.4 MG CAPS   Oral   Take 1 capsule (0.4 mg total) by mouth daily after supper.   30 capsule   0   . tiotropium (SPIRIVA) 18 MCG inhalation capsule   Inhalation   Place 18 mcg into inhaler and inhale daily.           . traZODone (DESYREL) 25 mg TABS   Oral   Take 0.5 tablets (25 mg total) by mouth at bedtime as needed (insomnia).   30 tablet   0    BP 126/57  Pulse 94  Temp(Src) 98.6 F (37 C) (Oral)  Resp 22  SpO2 97%  Physical Exam  Nursing note and vitals reviewed. Constitutional: He is oriented to person, place, and time. He appears well-developed and well-nourished. No distress.  HENT:  Head: Normocephalic and  atraumatic.  Mouth/Throat: Mucous membranes are dry.  Voice normal  Eyes: Conjunctivae and EOM are normal.  Neck: Neck supple. No tracheal deviation present.  Cardiovascular: Normal rate, regular rhythm and normal heart sounds.   No murmur heard. Pulmonary/Chest: Effort normal. No respiratory distress. He has no wheezes. He has rhonchi (bilateral).  Abdominal: Soft. Bowel sounds are normal. He exhibits no distension and no mass. There is no tenderness. There is no rebound and no guarding.  Musculoskeletal: Normal range of motion.  Some but limited movement of feet. Normal knees.  Neurological: He is alert and oriented to person, place, and time. No cranial nerve deficit. He exhibits normal muscle tone. Coordination normal.  Skin: Skin is warm and dry.  Psychiatric: He has a normal mood and affect. His behavior is normal.    ED Course  Procedures (including critical care time) DIAGNOSTIC STUDIES: Oxygen Saturation is 97% on RA, adequate by my interpretation.    COORDINATION OF CARE: 2:06 PM Discussed treatment plan which includes labs, head CT and EKG with pt at bedside and pt agreed to plan.   Results for orders placed during the hospital encounter of 01/31/13  GLUCOSE, CAPILLARY      Result Value Range   Glucose-Capillary 99  70 - 99 mg/dL  CBC WITH DIFFERENTIAL      Result Value Range   WBC 20.2 (*) 4.0 - 10.5 K/uL   RBC 3.69 (*) 4.22 - 5.81 MIL/uL   Hemoglobin 10.5 (*) 13.0 - 17.0 g/dL   HCT 96.2 (*) 95.2 - 84.1 %   MCV 85.1  78.0 - 100.0 fL   MCH 28.5  26.0 - 34.0 pg   MCHC 33.4  30.0 - 36.0 g/dL   RDW 32.4  40.1 - 02.7 %   Platelets 250  150 - 400 K/uL   Neutrophils Relative % 84 (*) 43 - 77 %   Lymphocytes Relative 10 (*) 12 - 46 %   Monocytes Relative 6  3 - 12 %   Eosinophils Relative 0  0 - 5 %   Basophils Relative 0  0 - 1 %   Neutro Abs 17.0 (*) 1.7 - 7.7 K/uL   Lymphs Abs 2.0  0.7 - 4.0 K/uL   Monocytes Absolute 1.2 (*) 0.1 - 1.0 K/uL   Eosinophils Absolute  0.0  0.0 - 0.7 K/uL   Basophils Absolute 0.0  0.0 - 0.1 K/uL   WBC Morphology WHITE COUNT CONFIRMED ON SMEAR     Smear Review LARGE PLATELETS PRESENT    BASIC METABOLIC PANEL      Result Value Range   Sodium 136  135 - 145 mEq/L   Potassium 3.9  3.5 - 5.1 mEq/L   Chloride 97  96 - 112 mEq/L   CO2 27  19 - 32 mEq/L   Glucose, Bld 107 (*) 70 - 99 mg/dL   BUN 18  6 - 23 mg/dL   Creatinine, Ser 2.53  0.50 - 1.35 mg/dL   Calcium 9.0  8.4 - 66.4 mg/dL   GFR calc non Af Amer 86 (*) >90 mL/min   GFR calc Af Amer >90  >90 mL/min  TROPONIN I      Result Value Range   Troponin I <0.30  <0.30 ng/mL  URINALYSIS, ROUTINE W REFLEX MICROSCOPIC      Result Value Range   Color, Urine YELLOW  YELLOW   APPearance CLEAR  CLEAR   Specific Gravity, Urine 1.015  1.005 - 1.030   pH 6.0  5.0 - 8.0   Glucose, UA NEGATIVE  NEGATIVE mg/dL   Hgb urine dipstick  MODERATE (*) NEGATIVE   Bilirubin Urine NEGATIVE  NEGATIVE   Ketones, ur NEGATIVE  NEGATIVE mg/dL   Protein, ur NEGATIVE  NEGATIVE mg/dL   Urobilinogen, UA 0.2  0.0 - 1.0 mg/dL   Nitrite NEGATIVE  NEGATIVE   Leukocytes, UA NEGATIVE  NEGATIVE  URINE MICROSCOPIC-ADD ON      Result Value Range   Squamous Epithelial / LPF RARE  RARE   WBC, UA 0-2  <3 WBC/hpf   RBC / HPF 3-6  <3 RBC/hpf   Bacteria, UA RARE  RARE  TROPONIN I      Result Value Range   Troponin I <0.30  <0.30 ng/mL     Dg Chest Portable 1 View  01/31/2013   *RADIOLOGY REPORT*  Clinical Data: 72 year old male chest pain difficulty breathing.  PORTABLE CHEST - 1 VIEW  Comparison: 01/25/2013 and earlier.  Findings: AP portable upright view at 1315 hours.  Stable lung volumes.  Stable cardiac size and mediastinal contours.  No pneumothorax, pulmonary edema or consolidation.  Continued blunting left costophrenic angle.  Streaky bibasilar opacity not significantly changed.  IMPRESSION: Stable. No acute cardiopulmonary abnormality.   Original Report Authenticated By: Erskine Speed, M.D.   Dg  Chest Port 1 View  01/25/2013   *RADIOLOGY REPORT*  Clinical Data: Severe emphysema with shortness of breath and chest pain  PORTABLE CHEST - 1 VIEW  Comparison: 01/24/2013, chest x-ray and CT  Findings: Heart and mediastinal contours are stable.  The lung fields demonstrate some prominence of the interstitial markings diffusely and more pronounced at the lung bases compatible with underlying bronchitic change. This finding is unchanged.  No signs of congestive failure or new focal infiltrate are seen.  No pleural fluid or increase in peribronchial cuffing is identified. Mild increased density medially along the right cardiac border correlates with an area of stable right middle lobe scarring well demonstrated on recent CT.  IMPRESSION: Stable cardiopulmonary appearance with no new focal or acute abnormality identified   Original Report Authenticated By: Rhodia Albright, M.D.     Date: 01/31/2013  Rate: 94  Rhythm: normal sinus rhythm  QRS Axis: normal  Intervals: normal  ST/T Wave abnormalities: normal  Conduction Disutrbances:none  Narrative Interpretation:   Old EKG Reviewed: none available  Results for orders placed during the hospital encounter of 01/31/13  GLUCOSE, CAPILLARY      Result Value Range   Glucose-Capillary 99  70 - 99 mg/dL  CBC WITH DIFFERENTIAL      Result Value Range   WBC 20.2 (*) 4.0 - 10.5 K/uL   RBC 3.69 (*) 4.22 - 5.81 MIL/uL   Hemoglobin 10.5 (*) 13.0 - 17.0 g/dL   HCT 40.9 (*) 81.1 - 91.4 %   MCV 85.1  78.0 - 100.0 fL   MCH 28.5  26.0 - 34.0 pg   MCHC 33.4  30.0 - 36.0 g/dL   RDW 78.2  95.6 - 21.3 %   Platelets 250  150 - 400 K/uL   Neutrophils Relative % 84 (*) 43 - 77 %   Lymphocytes Relative 10 (*) 12 - 46 %   Monocytes Relative 6  3 - 12 %   Eosinophils Relative 0  0 - 5 %   Basophils Relative 0  0 - 1 %   Neutro Abs 17.0 (*) 1.7 - 7.7 K/uL   Lymphs Abs 2.0  0.7 - 4.0 K/uL   Monocytes Absolute 1.2 (*) 0.1 - 1.0 K/uL   Eosinophils Absolute 0.0  0.0  -  0.7 K/uL   Basophils Absolute 0.0  0.0 - 0.1 K/uL   WBC Morphology WHITE COUNT CONFIRMED ON SMEAR     Smear Review LARGE PLATELETS PRESENT    BASIC METABOLIC PANEL      Result Value Range   Sodium 136  135 - 145 mEq/L   Potassium 3.9  3.5 - 5.1 mEq/L   Chloride 97  96 - 112 mEq/L   CO2 27  19 - 32 mEq/L   Glucose, Bld 107 (*) 70 - 99 mg/dL   BUN 18  6 - 23 mg/dL   Creatinine, Ser 5.78  0.50 - 1.35 mg/dL   Calcium 9.0  8.4 - 46.9 mg/dL   GFR calc non Af Amer 86 (*) >90 mL/min   GFR calc Af Amer >90  >90 mL/min  TROPONIN I      Result Value Range   Troponin I <0.30  <0.30 ng/mL  URINALYSIS, ROUTINE W REFLEX MICROSCOPIC      Result Value Range   Color, Urine YELLOW  YELLOW   APPearance CLEAR  CLEAR   Specific Gravity, Urine 1.015  1.005 - 1.030   pH 6.0  5.0 - 8.0   Glucose, UA NEGATIVE  NEGATIVE mg/dL   Hgb urine dipstick MODERATE (*) NEGATIVE   Bilirubin Urine NEGATIVE  NEGATIVE   Ketones, ur NEGATIVE  NEGATIVE mg/dL   Protein, ur NEGATIVE  NEGATIVE mg/dL   Urobilinogen, UA 0.2  0.0 - 1.0 mg/dL   Nitrite NEGATIVE  NEGATIVE   Leukocytes, UA NEGATIVE  NEGATIVE  URINE MICROSCOPIC-ADD ON      Result Value Range   Squamous Epithelial / LPF RARE  RARE   WBC, UA 0-2  <3 WBC/hpf   RBC / HPF 3-6  <3 RBC/hpf   Bacteria, UA RARE  RARE  TROPONIN I      Result Value Range   Troponin I <0.30  <0.30 ng/mL      1. Chest pain   2. Shortness of breath     MDM  Patient with chest pain that states is worse today. I recently discharged from the hospital on Thursday for COPD CHF exacerbation no evidence of that today. Hospitalist to agree to bring him in had on and off status overnight for chest pain rule out. Patient also has a lot of social issues struggling to be at home by himself has been evaluated by social services in the past will be reevaluated.  Chest pain workup here is been negative with 2 troponins EKG without any acute changes. Clinically some suspicion whether this is  a true chest pain or not. Aspirin given in the ED. Correction patient has an allergy to aspirin so not given.   I personally performed the services described in this documentation, which was scribed in my presence. The recorded information has been reviewed and is accurate.      Shelda Jakes, MD 01/31/13 [id number]  Shelda Jakes, MD 01/31/13 2841  Shelda Jakes, MD 01/31/13 [phone]

## 2013-01-31 NOTE — ED Notes (Signed)
Pt c/o continued cp/sob/weakness since being d/c'd from hospital on Thursday (admitted for COPD/CHF).

## 2013-02-01 DIAGNOSIS — R0902 Hypoxemia: Secondary | ICD-10-CM

## 2013-02-01 DIAGNOSIS — J9610 Chronic respiratory failure, unspecified whether with hypoxia or hypercapnia: Secondary | ICD-10-CM

## 2013-02-01 LAB — TSH: TSH: 3.048 u[IU]/mL (ref 0.350–4.500)

## 2013-02-01 LAB — BASIC METABOLIC PANEL
Calcium: 8.9 mg/dL (ref 8.4–10.5)
GFR calc Af Amer: 79 mL/min — ABNORMAL LOW (ref >90)
GFR calc non Af Amer: 68 mL/min — ABNORMAL LOW (ref >90)
Potassium: 4 mEq/L (ref 3.5–5.1)
Sodium: 134 mEq/L — ABNORMAL LOW (ref 135–145)

## 2013-02-01 LAB — CBC
Platelets: 244 10*3/uL (ref 150–400)
RBC: 3.87 MIL/uL — ABNORMAL LOW (ref 4.22–5.81)
WBC: 19 10*3/uL — ABNORMAL HIGH (ref 4.0–10.5)

## 2013-02-01 LAB — TROPONIN I
Troponin I: <0.3 ng/mL (ref <0.30)
Troponin I: <0.3 ng/mL (ref <0.30)

## 2013-02-01 LAB — HEMOGLOBIN A1C: Mean Plasma Glucose: 120 mg/dL — ABNORMAL HIGH (ref <117)

## 2013-02-01 LAB — VITAMIN B12: Vitamin B-12: 278 pg/mL (ref 211–911)

## 2013-02-01 MED ORDER — PANTOPRAZOLE SODIUM 40 MG PO TBEC: 40.0000 mg | DELAYED_RELEASE_TABLET | Freq: Every day | ORAL | Status: DC

## 2013-02-01 MED ORDER — NICOTINE 14 MG/24HR TD PT24: 1.0000 | MEDICATED_PATCH | Freq: Every day | TRANSDERMAL | Status: DC | PRN

## 2013-02-01 NOTE — Progress Notes (Signed)
02/01/13 1542 Reviewed discharge instructions with patient via teachback method. Given copy of instructions, medication list, prescriptions, and f/u information. States has f/lu appointment setup with PCP for Thursday, will have his home medications refilled then. Noted when medications due on medication list. Verbalized understanding of instructions and when to call MD. IV site d/c'd and within normal limits. Telemetry d/c'd, CMT notified of discharge per nursing secretary. Portable O2 tank delivered by [facility] for discharge home. Discussed smoking cessation with patient. Home Health to resume with [facility]. Patient left floor in stable condition via w/c accompanied by nurse tech. [facility] to pick him up for transport home. Earnstine Regal, RN

## 2013-02-01 NOTE — Progress Notes (Addendum)
Nutrition Brief Note  Patient identified on the Malnutrition Screening Tool (MST) Report  Body mass index is 25.34 kg/(m^2). Patient meets criteria for overweight based on current BMI.   Current diet order is Regular, patient is consuming approximately 100% of meals at this time. He reports good appetite. Labs and medications reviewed. Possible discharge later today.  No nutrition interventions warranted at this time. If nutrition issues arise, please consult RD.   Royann Shivers MS,RD,LDN,CSG Office: [phone] Pager: [phone]

## 2013-02-01 NOTE — Discharge Summary (Signed)
Patient seen, independently examined and chart reviewed. I agree with exam, assessment and plan discussed with Toya Smothers, NP.  Overnight the patient has had no new issues. His breathing seems to be at baseline. Chronic GI problems appear to be stable and have close outpatient followup arranged. Cardiac enzymes were negative and he has had an extensive evaluation recently as documented. No history, signs or symptoms to suggest cardiac etiology and no further evaluation is suggested. His COPD appears to be stable and indeed he was sitting in a chair reading the paper without oxygen on when I came in to see him without apparent distress. He has been evaluated by physical  therapy recently and home health has been recommended. He does not appear to be any change in his functional status. Assisted living was offered to him but he declined. Of interest is persistent, intermittent leukocytosis, first seen 06/2012, then the 08/2012, 09/2012 and on hospitalization earlier this month. The patient has no signs or symptoms to suggest infection and is currently on steroids which may account for the bulk of this. Additionally this can sometimes be seen in cigarette smokers. At this point no further inpatient evaluation suggested and this can be followed up in the outpatient setting.  Brendia Sacks, MD [facility] [phone]

## 2013-02-01 NOTE — Plan of Care (Signed)
Problem: Phase I Progression Outcomes Goal: OOB as tolerated unless otherwise ordered Outcome: Progressing 02/01/13 1149 assisted up to chair this morning, tolerated well. Chair alarm on for safety, call light and phone within reach. Instructed to call for assistance and not get up on his own for safety. States will call. Earnstine Regal, RN

## 2013-02-01 NOTE — Discharge Summary (Signed)
Physician Discharge Summary  [name] ZOX:[id number] DOB: 1940-10-10 DOA: 01/31/2013  PCP: Josue Hector, MD  Admit date: 01/31/2013 Discharge date: 02/01/2013  Time spent: 40  minutes  Recommendations for Outpatient Follow-up:  1. Follow up with PCP 1 week for evaluation of symptoms  Discharge Diagnoses:  Principal Problem:   Chest pain Active Problems:   DM type 2 (diabetes mellitus, type 2)   Anemia   Esophageal dysmotility   Shortness of breath   PTSD (post-traumatic stress disorder)   Leukocytosis, unspecified   Discharge Condition: stable  Diet recommendation: carb modified  Filed Weights   01/31/13 1905 02/01/13 0300 02/01/13 0557  Weight: 66.9 kg (147 lb 7.8 oz) 67 kg (147 lb 11.3 oz) 67 kg (147 lb 11.3 oz)    History of present illness:  [name] is a 72 y.o. male. Elderly Caucasian gentleman and a veteran of the [place] War, diagnosed with posttraumatic stress disorder, discharged from the hospital 4 days prior to presentation after treatment for COPD exacerbation and another episode of recurrence of noncardiac chest pain.  Patient returned to the emergency room this 01/31/13 complaining of worsening chest pain and that it never resolved and he was tired of being given the run around.  He was noted at his previous admission to have an esophageal dysmotility disorder, and had an appointment  Crissie Reese at [facility] July 8.  He reported certain foods especially spicy foods upset his stomach  Patient reporeds that he really had no new symptoms since his recent discharge, and nothing had changed except that the pain is worse; he did not seem to be taking a proton pump inhibitor.  He did have a white count elevated above his discharge level, and a mild left shift was noted   Hospital Course:  Chest pain: In pt with chronic chest pain likely related to GI. On previous admission noted to have an esophogeal motility disorder. He has  appointment 02/08/13 for follow up.  Recent cp work up yields ct angio neg for PE, echo earlier this year yields normal systolic function. EKG NSR. No events on tele. Pt admitted and provided with  PPI . At discharge chest pain at baseline. Continue PPI    Active Problems:  DM type 2 (diabetes mellitus, type 2) :A1c last week 5.8. Will continue current regimen at discharge.    Anemia: stable no s/sx bleeding.   Esophageal dysmotility: encouraged PPI compliance. Has follow up appointment at [facility] 02/08/13.    Shortness of breath: pt with chronic respiratory failure due to COPD. Stable at baseline at discharge. Continue home oxygen and nebs.   PTSD (post-traumatic stress disorder): stable at baseline.    Leukocytosis, unspecified : on admission white count 20.2. At discharge white count 19. Pt remained afebrile and non-toxic during hospitalization. Chest xray and urinalysis without signs infection. Chart review indicates chronic leukocytosis.   Procedures:  none  Consultations:  none  Discharge Exam: Filed Vitals:   02/01/13 0504 02/01/13 0557 02/01/13 0856 02/01/13 1130  BP: 96/51  127/57   Pulse: 90  81   Temp: 99.2 F (37.3 C)  97.9 F (36.6 C)   TempSrc: Oral  Oral   Resp: 20  20   Height:  5\' 4"  (1.626 m)    Weight:  67 kg (147 lb 11.3 oz)    SpO2: 95%  96% 99%    General: sitting on side of bed. NAD Cardiovascular: RRR No MGR No LE edema PPP  Respiratory: normal effort Decreased BS in upper lobes with faint expiratory wheeze. No rhonchi no crackles. Better air flow bilateral bases Abdomen: round soft +BS non-tender to palpation  Discharge Instructions      Discharge Orders   Future Appointments Provider Department Dept Phone   02/18/2013 1:45 PM Barbaraann Share, MD [facility] [phone]   Future Orders Complete By Expires     Diet - low sodium heart healthy  As directed     Discharge instructions  As directed     Comments:      Take medication as  directed    Increase activity slowly  As directed         Medication List    STOP taking these medications       ciprofloxacin 500 MG tablet  Commonly known as:  CIPRO      TAKE these medications       albuterol (2.5 MG/3ML) 0.083% nebulizer solution  Commonly known as:  PROVENTIL  Take 3 mLs (2.5 mg total) by nebulization 4 (four) times daily.     albuterol 108 (90 BASE) MCG/ACT inhaler  Commonly known as:  PROVENTIL HFA;VENTOLIN HFA  Inhale 2 puffs into the lungs every 6 (six) hours as needed for wheezing.     ALPRAZolam 0.25 MG tablet  Commonly known as:  XANAX  Take 0.25 mg by mouth 3 (three) times daily as needed for anxiety.     budesonide-formoterol 160-4.5 MCG/ACT inhaler  Commonly known as:  SYMBICORT  Inhale 2 puffs into the lungs 2 (two) times daily.     diltiazem 180 MG 24 hr capsule  Commonly known as:  CARDIZEM CD  Take 1 capsule (180 mg total) by mouth daily. For control of your heart rate.     furosemide 20 MG tablet  Commonly known as:  LASIX  Take 20 mg by mouth daily. For swelling in your feet and legs. Take potassium supplement with this medicine.     guaiFENesin 600 MG 12 hr tablet  Commonly known as:  MUCINEX  Take 2 tablets (1,200 mg total) by mouth 2 (two) times daily.     hydroxypropyl methylcellulose 2.5 % ophthalmic solution  Commonly known as:  ISOPTO TEARS  Place 1 drop into both eyes daily as needed (Dry Eye).     metFORMIN 1000 MG tablet  Commonly known as:  GLUCOPHAGE  Take 1,000 mg by mouth 2 (two) times daily.     nicotine 14 mg/24hr patch  Commonly known as:  NICODERM CQ - dosed in mg/24 hours  Place 1 patch onto the skin daily as needed (nicotine withdrawal).     oxyCODONE-acetaminophen 5-325 MG per tablet  Commonly known as:  PERCOCET/ROXICET  Take 1 tablet by mouth 2 (two) times daily.     pantoprazole 40 MG tablet  Commonly known as:  PROTONIX  Take 1 tablet (40 mg total) by mouth daily.     potassium chloride SA 20  MEQ tablet  Commonly known as:  K-DUR,KLOR-CON  Take 1 tablet (20 mEq total) by mouth daily. Take potassium supplement with furosemide (Lasix).     predniSONE 10 MG tablet  Commonly known as:  DELTASONE  Start 6/27. 6/27-6/29: Take 40 mg in the morning with breakfast daily. 6/30-7/2: Take 20 mg by mouth in the morning with breakfast. 7/3-7/5: Take 10 mg by mouth daily then stop.     sennosides-docusate sodium 8.6-50 MG tablet  Commonly known as:  SENOKOT-S  Take 1-2 tablets by  mouth daily. Take daily to avoid constipation.     sorbitol 70 % Soln  Take 30 mLs by mouth daily as needed.     tamsulosin 0.4 MG Caps  Commonly known as:  FLOMAX  Take 1 capsule (0.4 mg total) by mouth daily after supper.     tiotropium 18 MCG inhalation capsule  Commonly known as:  SPIRIVA  Place 18 mcg into inhaler and inhale daily.     traZODone 25 mg Tabs  Commonly known as:  DESYREL  Take 0.5 tablets (25 mg total) by mouth at bedtime as needed (insomnia).       Allergies  Allergen Reactions  . Aspirin Other (See Comments)    Stomach upset   Follow-up Information   Follow up with Josue Hector, MD. Schedule an appointment as soon as possible for a visit in 1 week. (evaluation of symptoms)    Contact information:   [address] [facility] [address] [phone]        The results of significant diagnostics from this hospitalization (including imaging, microbiology, ancillary and laboratory) are listed below for reference.    Significant Diagnostic Studies: Dg Chest 2 View  01/24/2013   *RADIOLOGY REPORT*  Clinical Data: Short of breath and chest pain  CHEST - 2 VIEW  Comparison: 08/29/2012  Findings: COPD with marked hyperinflation and hyperlucency of the lungs.  Negative for pneumonia.  Negative for heart failure or effusion.  No mass lesion.  IMPRESSION: Severe COPD and hyperinflation.  No acute radiographic abnormality.   Original Report Authenticated By: Janeece Riggers, M.D.    Ct Head Wo Contrast  01/31/2013   *RADIOLOGY REPORT*  Clinical Data: Weakness and dizziness  CT HEAD WITHOUT CONTRAST  Technique:  Contiguous axial images were obtained from the base of the skull through the vertex without contrast.  Comparison: Prior CT from 01/25/2013  Findings: There is no acute intracranial hemorrhage or infarct.  No mass or midline shift.  There is mild prominence of the CSF containing spaces, consistent with atrophy, unchanged. Hypoattenuation within the periventricular white matter is consistent with chronic microvascular disease.  No extra-axial fluid collection.  Calvarium is intact.  IMPRESSION: Mild atrophy with chronic small vessel ischemic changes.  No acute intracranial process.   Original Report Authenticated By: Rise Mu, M.D.   Ct Head Wo Contrast  01/25/2013   *RADIOLOGY REPORT*  Clinical Data: Dizziness, syncope.  CT HEAD WITHOUT CONTRAST  Technique:  Contiguous axial images were obtained from the base of the skull through the vertex without contrast.  Comparison: 06/24/2005  Findings: There is atrophy and chronic small vessel disease changes. No acute intracranial abnormality.  Specifically, no hemorrhage, hydrocephalus, mass lesion, acute infarction, or significant intracranial injury.  No acute calvarial abnormality. Visualized paranasal sinuses and mastoids clear.  Orbital soft tissues unremarkable.  IMPRESSION: No acute intracranial abnormality.  Atrophy, chronic microvascular disease.   Original Report Authenticated By: Charlett Nose, M.D.   Ct Angio Chest Pe W/cm &/or Wo Cm  01/24/2013   *RADIOLOGY REPORT*  Clinical Data: Chest pain and short of breath  CT ANGIOGRAPHY CHEST  Technique:  Multidetector CT imaging of the chest using the standard protocol during bolus administration of intravenous contrast. Multiplanar reconstructed images including MIPs were obtained and reviewed to evaluate the vascular anatomy.  Contrast: OMNIPAQUE IOHEXOL 350 MG/ML  SOLN  Comparison: Chest x-ray today.  CT chest 06/15/2012  Findings: Negative for pulmonary embolism.  Atherosclerotic aorta without aneurysm or dissection.  Heart size is within  normal limits.  No pericardial effusion.  Coronary artery calcification.  Severe apical emphysema.  Negative for pneumonia.  Negative for mass or adenopathy.  No pleural effusion. Right middle lobe scarring is stable.  Compression fracture approximately T9 unchanged from prior CT.  No acute bony lesion.  IMPRESSION: COPD and emphysema.  Negative for pulmonary embolism or other acute abnormality.   Original Report Authenticated By: Janeece Riggers, M.D.   Dg Chest Portable 1 View  01/31/2013   *RADIOLOGY REPORT*  Clinical Data: 72 year old male chest pain difficulty breathing.  PORTABLE CHEST - 1 VIEW  Comparison: 01/25/2013 and earlier.  Findings: AP portable upright view at 1315 hours.  Stable lung volumes.  Stable cardiac size and mediastinal contours.  No pneumothorax, pulmonary edema or consolidation.  Continued blunting left costophrenic angle.  Streaky bibasilar opacity not significantly changed.  IMPRESSION: Stable. No acute cardiopulmonary abnormality.   Original Report Authenticated By: Erskine Speed, M.D.   Dg Chest Port 1 View  01/25/2013   *RADIOLOGY REPORT*  Clinical Data: Severe emphysema with shortness of breath and chest pain  PORTABLE CHEST - 1 VIEW  Comparison: 01/24/2013, chest x-ray and CT  Findings: Heart and mediastinal contours are stable.  The lung fields demonstrate some prominence of the interstitial markings diffusely and more pronounced at the lung bases compatible with underlying bronchitic change. This finding is unchanged.  No signs of congestive failure or new focal infiltrate are seen.  No pleural fluid or increase in peribronchial cuffing is identified. Mild increased density medially along the right cardiac border correlates with an area of stable right middle lobe scarring well demonstrated on recent CT.   IMPRESSION: Stable cardiopulmonary appearance with no new focal or acute abnormality identified   Original Report Authenticated By: Rhodia Albright, M.D.    Microbiology: No results found for this or any previous visit (from the past 240 hour(s)).   Labs: Basic Metabolic Panel:  Recent Labs Lab 01/26/13 0450 01/31/13 1313 01/31/13 2027 02/01/13 0519  NA 134* 136  --  134*  K 4.2 3.9  --  4.0  CL 102 97  --  97  CO2 21 27  --  28  GLUCOSE 169* 107*  --  98  BUN 26* 18  --  20  CREATININE 1.17 0.85  --  1.07  CALCIUM 9.6 9.0  --  8.9  MG  --   --  2.0  --    Liver Function Tests:  Recent Labs Lab 01/31/13 2027  AST 10  ALT 9  ALKPHOS 69  BILITOT 0.2*  PROT 6.4  ALBUMIN 3.0*   No results found for this basename: LIPASE, AMYLASE,  in the last 168 hours No results found for this basename: AMMONIA,  in the last 168 hours CBC:  Recent Labs Lab 01/26/13 0450 01/31/13 1313 02/01/13 0519  WBC 15.7* 20.2* 19.0*  NEUTROABS  --  17.0*  --   HGB 10.2* 10.5* 11.0*  HCT 30.4* 31.4* 33.1*  MCV 84.2 85.1 85.5  PLT 243 250 244   Cardiac Enzymes:  Recent Labs Lab 01/31/13 1313 01/31/13 1656 02/01/13 0007 02/01/13 0520  TROPONINI <0.30 <0.30 <0.30 <0.30   BNP: BNP (last 3 results)  Recent Labs  08/19/12 0423 08/29/12 1043 01/24/13 1615  PROBNP 5740.0* 555.5* 110.5   CBG:  Recent Labs Lab 01/27/13 1128 01/31/13 1307 01/31/13 2231 02/01/13 0721 02/01/13 1154  GLUCAP 181* 99 101* 97 91       Signed:  Tiffeny Minchew M  [facility] 02/01/2013, 2:41 PM

## 2013-02-01 NOTE — Clinical Social Work Note (Signed)
Per CM, pt requesting food assistance as he does not receive his check until Thursday. CSW printed off food pantries in [place] where pt lives and gave to pt. Possible d/c today. Pt refusing ALF due to private pay.   Derenda Fennel, [place] [phone]

## 2013-02-01 NOTE — Care Management Note (Signed)
    Page 1 of 2   02/01/2013     12:06:21 PM   CARE MANAGEMENT NOTE 02/01/2013  Patient:  Brandon, Hamilton   Account Number:  [id number]  Date Initiated:  02/01/2013  Documentation initiated by:  Sharrie Rothman  Subjective/Objective Assessment:   Pt admitted from home with CP. Pt lives alone and has 2 sons who are not very active in the care of the pt. Pt has O2 with [employer], walker, cane, w/c. Pt is active with [facility] RN and PT.     Action/Plan:   Alroy Bailiff of [facility] is aware of pt and will collect pts information from the chart. Pt was given the names and numbers of food banks in the area to help with food until pt gets SSI on 7/3. No other CM needs noted.   Anticipated DC Date:  02/02/2013   Anticipated DC Plan:  HOME W HOME HEALTH SERVICES      DC Planning Services  CM consult      [facility] Choice  Resumption Of Svcs/PTA Provider   Choice offered to / List presented to:          [facility] arranged  HH-1 RN  HH-2 PT      [facility] agency  [facility]   Status of service:  Completed, signed off Medicare Important Message given?   (If response is "NO", the following Medicare IM given date fields will be blank) Date Medicare IM given:   Date Additional Medicare IM given:    Discharge Disposition:  HOME W HOME HEALTH SERVICES  Per UR Regulation:    If discussed at Long Length of Stay Meetings, dates discussed:    Comments:  02/01/13 1140 Arlyss Queen, RN BSN CM CM talked with Patsy Lager about pts portable O2. Pt will need portable tank delivered to pts room prior to discharge for transportation home.

## 2013-02-01 NOTE — Progress Notes (Signed)
UR chart review completed.  

## 2013-02-15 ENCOUNTER — Emergency Department (HOSPITAL_COMMUNITY): Payer: Medicare Other

## 2013-02-15 ENCOUNTER — Encounter (HOSPITAL_COMMUNITY): Payer: Self-pay

## 2013-02-15 ENCOUNTER — Emergency Department (HOSPITAL_COMMUNITY)
Admission: EM | Admit: 2013-02-15 | Discharge: 2013-02-18 | Disposition: A | Discharge status: Home / Self Care | Payer: Medicare Other | Attending: Emergency Medicine | Admitting: Emergency Medicine

## 2013-02-15 DIAGNOSIS — R51 Headache: Secondary | ICD-10-CM | POA: Insufficient documentation

## 2013-02-15 DIAGNOSIS — IMO0002 Reserved for concepts with insufficient information to code with codable children: Secondary | ICD-10-CM | POA: Insufficient documentation

## 2013-02-15 DIAGNOSIS — Z872 Personal history of diseases of the skin and subcutaneous tissue: Secondary | ICD-10-CM | POA: Insufficient documentation

## 2013-02-15 DIAGNOSIS — Z862 Personal history of diseases of the blood and blood-forming organs and certain disorders involving the immune mechanism: Secondary | ICD-10-CM | POA: Insufficient documentation

## 2013-02-15 DIAGNOSIS — R296 Repeated falls: Secondary | ICD-10-CM | POA: Insufficient documentation

## 2013-02-15 DIAGNOSIS — Y929 Unspecified place or not applicable: Secondary | ICD-10-CM | POA: Insufficient documentation

## 2013-02-15 DIAGNOSIS — Z9089 Acquired absence of other organs: Secondary | ICD-10-CM | POA: Insufficient documentation

## 2013-02-15 DIAGNOSIS — Z9889 Other specified postprocedural states: Secondary | ICD-10-CM | POA: Insufficient documentation

## 2013-02-15 DIAGNOSIS — M542 Cervicalgia: Secondary | ICD-10-CM | POA: Insufficient documentation

## 2013-02-15 DIAGNOSIS — G8929 Other chronic pain: Secondary | ICD-10-CM | POA: Insufficient documentation

## 2013-02-15 DIAGNOSIS — F3289 Other specified depressive episodes: Secondary | ICD-10-CM | POA: Insufficient documentation

## 2013-02-15 DIAGNOSIS — E119 Type 2 diabetes mellitus without complications: Secondary | ICD-10-CM | POA: Insufficient documentation

## 2013-02-15 DIAGNOSIS — Y939 Activity, unspecified: Secondary | ICD-10-CM | POA: Insufficient documentation

## 2013-02-15 DIAGNOSIS — R109 Unspecified abdominal pain: Secondary | ICD-10-CM | POA: Insufficient documentation

## 2013-02-15 DIAGNOSIS — Z8719 Personal history of other diseases of the digestive system: Secondary | ICD-10-CM | POA: Insufficient documentation

## 2013-02-15 DIAGNOSIS — R079 Chest pain, unspecified: Secondary | ICD-10-CM | POA: Insufficient documentation

## 2013-02-15 DIAGNOSIS — M549 Dorsalgia, unspecified: Secondary | ICD-10-CM | POA: Insufficient documentation

## 2013-02-15 DIAGNOSIS — S8990XA Unspecified injury of unspecified lower leg, initial encounter: Secondary | ICD-10-CM | POA: Insufficient documentation

## 2013-02-15 DIAGNOSIS — I10 Essential (primary) hypertension: Secondary | ICD-10-CM | POA: Insufficient documentation

## 2013-02-15 DIAGNOSIS — F4310 Post-traumatic stress disorder, unspecified: Secondary | ICD-10-CM | POA: Insufficient documentation

## 2013-02-15 DIAGNOSIS — F329 Major depressive disorder, single episode, unspecified: Secondary | ICD-10-CM | POA: Insufficient documentation

## 2013-02-15 DIAGNOSIS — I252 Old myocardial infarction: Secondary | ICD-10-CM | POA: Insufficient documentation

## 2013-02-15 DIAGNOSIS — J9610 Chronic respiratory failure, unspecified whether with hypoxia or hypercapnia: Secondary | ICD-10-CM | POA: Insufficient documentation

## 2013-02-15 DIAGNOSIS — Z79899 Other long term (current) drug therapy: Secondary | ICD-10-CM | POA: Insufficient documentation

## 2013-02-15 DIAGNOSIS — F17200 Nicotine dependence, unspecified, uncomplicated: Secondary | ICD-10-CM | POA: Insufficient documentation

## 2013-02-15 DIAGNOSIS — J441 Chronic obstructive pulmonary disease with (acute) exacerbation: Secondary | ICD-10-CM | POA: Insufficient documentation

## 2013-02-15 DIAGNOSIS — Z8679 Personal history of other diseases of the circulatory system: Secondary | ICD-10-CM | POA: Insufficient documentation

## 2013-02-15 DIAGNOSIS — Z8669 Personal history of other diseases of the nervous system and sense organs: Secondary | ICD-10-CM | POA: Insufficient documentation

## 2013-02-15 LAB — SALICYLATE LEVEL: Salicylate Lvl: 19.7 mg/dL (ref 2.8–20.0)

## 2013-02-15 LAB — COMPREHENSIVE METABOLIC PANEL
ALT: 8 U/L (ref 0–53)
CO2: 25 mEq/L (ref 19–32)
Calcium: 9.7 mg/dL (ref 8.4–10.5)
Creatinine, Ser: 1.02 mg/dL (ref 0.50–1.35)
GFR calc Af Amer: 84 mL/min — ABNORMAL LOW (ref >90)
GFR calc non Af Amer: 72 mL/min — ABNORMAL LOW (ref >90)
Glucose, Bld: 93 mg/dL (ref 70–99)
Total Bilirubin: 0.1 mg/dL — ABNORMAL LOW (ref 0.3–1.2)

## 2013-02-15 LAB — RAPID URINE DRUG SCREEN, HOSP PERFORMED
Opiates: NOT DETECTED
Tetrahydrocannabinol: NOT DETECTED

## 2013-02-15 LAB — ETHANOL: Alcohol, Ethyl (B): <11 mg/dL (ref 0–11)

## 2013-02-15 LAB — TROPONIN I: Troponin I: <0.3 ng/mL (ref <0.30)

## 2013-02-15 MED ORDER — LORAZEPAM 1 MG PO TABS
1.0000 mg | ORAL_TABLET | Freq: Three times a day (TID) | ORAL | Status: DC | PRN
  Administered 2013-02-15 – 2013-02-17 (×2): 1 mg via ORAL
  Filled 2013-02-15 (×3): qty 1

## 2013-02-15 MED ORDER — POTASSIUM CHLORIDE CRYS ER 20 MEQ PO TBCR
20.0000 meq | EXTENDED_RELEASE_TABLET | Freq: Every day | ORAL | Status: DC
  Administered 2013-02-15 – 2013-02-17 (×3): 20 meq via ORAL
  Filled 2013-02-15 (×3): qty 1

## 2013-02-15 MED ORDER — TRAZODONE HCL 50 MG PO TABS
ORAL_TABLET | ORAL | Status: AC
  Filled 2013-02-15: qty 1

## 2013-02-15 MED ORDER — ALBUTEROL SULFATE HFA 108 (90 BASE) MCG/ACT IN AERS
2.0000 | INHALATION_SPRAY | Freq: Four times a day (QID) | RESPIRATORY_TRACT | Status: DC | PRN
  Administered 2013-02-18: 2 via RESPIRATORY_TRACT

## 2013-02-15 MED ORDER — DILTIAZEM HCL ER COATED BEADS 180 MG PO CP24
ORAL_CAPSULE | ORAL | Status: AC
  Filled 2013-02-15: qty 1

## 2013-02-15 MED ORDER — TRAZODONE HCL 50 MG PO TABS
25.0000 mg | ORAL_TABLET | Freq: Every evening | ORAL | Status: DC | PRN
  Administered 2013-02-17: 25 mg via ORAL
  Administered 2013-02-17: 22:00:00 via ORAL
  Filled 2013-02-15: qty 1
  Filled 2013-02-15: qty 0.5

## 2013-02-15 MED ORDER — DILTIAZEM HCL ER COATED BEADS 180 MG PO CP24
180.0000 mg | ORAL_CAPSULE | Freq: Every day | ORAL | Status: DC
  Administered 2013-02-15 – 2013-02-18 (×4): 180 mg via ORAL
  Filled 2013-02-15 (×8): qty 1

## 2013-02-15 MED ORDER — OXYCODONE-ACETAMINOPHEN 5-325 MG PO TABS
1.0000 | ORAL_TABLET | Freq: Two times a day (BID) | ORAL | Status: DC
  Administered 2013-02-15 – 2013-02-17 (×5): 1 via ORAL
  Filled 2013-02-15 (×3): qty 1

## 2013-02-15 MED ORDER — NICOTINE 14 MG/24HR TD PT24
14.0000 mg | MEDICATED_PATCH | Freq: Every day | TRANSDERMAL | Status: DC | PRN
  Administered 2013-02-17: 14 mg via TRANSDERMAL
  Filled 2013-02-15: qty 1

## 2013-02-15 MED ORDER — TAMSULOSIN HCL 0.4 MG PO CAPS
0.4000 mg | ORAL_CAPSULE | Freq: Every day | ORAL | Status: DC
  Administered 2013-02-16 – 2013-02-17 (×2): 0.4 mg via ORAL
  Filled 2013-02-15 (×5): qty 1

## 2013-02-15 MED ORDER — BUDESONIDE-FORMOTEROL FUMARATE 160-4.5 MCG/ACT IN AERO
2.0000 | INHALATION_SPRAY | Freq: Two times a day (BID) | RESPIRATORY_TRACT | Status: DC
  Administered 2013-02-15 – 2013-02-18 (×6): 2 via RESPIRATORY_TRACT
  Filled 2013-02-15 (×2): qty 6

## 2013-02-15 MED ORDER — GUAIFENESIN ER 600 MG PO TB12
1200.0000 mg | ORAL_TABLET | Freq: Two times a day (BID) | ORAL | Status: DC
  Administered 2013-02-16 – 2013-02-17 (×4): 1200 mg via ORAL
  Filled 2013-02-15 (×12): qty 2

## 2013-02-15 MED ORDER — BUDESONIDE-FORMOTEROL FUMARATE 160-4.5 MCG/ACT IN AERO
INHALATION_SPRAY | RESPIRATORY_TRACT | Status: AC
  Filled 2013-02-15: qty 6

## 2013-02-15 MED ORDER — ALPRAZOLAM 0.5 MG PO TABS
0.2500 mg | ORAL_TABLET | Freq: Once | ORAL | Status: AC
  Administered 2013-02-15: 0.25 mg via ORAL
  Filled 2013-02-15: qty 1

## 2013-02-15 MED ORDER — DM-GUAIFENESIN ER 30-600 MG PO TB12
ORAL_TABLET | ORAL | Status: AC
  Filled 2013-02-15: qty 2

## 2013-02-15 MED ORDER — FUROSEMIDE 40 MG PO TABS
20.0000 mg | ORAL_TABLET | Freq: Every day | ORAL | Status: DC
  Administered 2013-02-15 – 2013-02-17 (×3): 20 mg via ORAL
  Filled 2013-02-15 (×3): qty 1

## 2013-02-15 MED ORDER — PANTOPRAZOLE SODIUM 40 MG PO TBEC
40.0000 mg | DELAYED_RELEASE_TABLET | Freq: Every day | ORAL | Status: DC
  Administered 2013-02-15 – 2013-02-17 (×3): 40 mg via ORAL
  Filled 2013-02-15 (×3): qty 1

## 2013-02-15 MED ORDER — OXYCODONE-ACETAMINOPHEN 5-325 MG PO TABS
1.0000 | ORAL_TABLET | Freq: Once | ORAL | Status: AC
  Administered 2013-02-15: 1 via ORAL
  Filled 2013-02-15: qty 1

## 2013-02-15 MED ORDER — ALBUTEROL SULFATE (5 MG/ML) 0.5% IN NEBU
5.0000 mg | INHALATION_SOLUTION | Freq: Once | RESPIRATORY_TRACT | Status: AC
  Administered 2013-02-15: 5 mg via RESPIRATORY_TRACT
  Filled 2013-02-15: qty 1

## 2013-02-15 MED ORDER — METFORMIN HCL 500 MG PO TABS
1000.0000 mg | ORAL_TABLET | Freq: Two times a day (BID) | ORAL | Status: DC
  Administered 2013-02-15 – 2013-02-17 (×5): 1000 mg via ORAL
  Filled 2013-02-15 (×3): qty 2

## 2013-02-15 MED ORDER — IPRATROPIUM BROMIDE 0.02 % IN SOLN
0.5000 mg | Freq: Once | RESPIRATORY_TRACT | Status: AC
  Administered 2013-02-15: 0.5 mg via RESPIRATORY_TRACT
  Filled 2013-02-15: qty 2.5

## 2013-02-15 MED ORDER — ALPRAZOLAM 0.5 MG PO TABS
0.2500 mg | ORAL_TABLET | Freq: Three times a day (TID) | ORAL | Status: DC | PRN
  Administered 2013-02-17 (×2): 0.25 mg via ORAL
  Filled 2013-02-15 (×2): qty 1

## 2013-02-15 MED ORDER — HYPROMELLOSE (GONIOSCOPIC) 2.5 % OP SOLN
1.0000 [drp] | Freq: Every day | OPHTHALMIC | Status: DC | PRN
  Filled 2013-02-15: qty 15

## 2013-02-15 NOTE — ED Provider Notes (Signed)
History    CSN: [id number] Arrival date & time 02/15/13  1353  First MD Initiated Contact with Patient 02/15/13 1415     Chief Complaint  Patient presents with  . Anxiety    Patient is a 72 y.o. male presenting with anxiety. The history is provided by the patient.  Anxiety This is a recurrent problem. Episode onset: today. The problem occurs constantly. The problem has been gradually worsening. Associated symptoms include chest pain, abdominal pain, headaches and shortness of breath. Nothing aggravates the symptoms. Nothing relieves the symptoms.  pt presents for multiple complaints Pt reports today he had a panic attack, he began to have chest pain, short of breath, headaches, back pain, neck pain.  He reports he "fell out" and injured his left knee.  He denies any other new injury He reports he "can't take it anymore" Past Medical History  Diagnosis Date  . Essential hypertension, benign   . Alcohol abuse   . PTSD (post-traumatic stress disorder)   . Chronic back pain   . Depression   . Peripheral neuropathy   . DJD (degenerative joint disease)   . COPD (chronic obstructive pulmonary disease)   . Anemia   . Myocardial infarction   . Type II or unspecified type diabetes mellitus without mention of complication, not stated as uncontrolled   . Esophageal dysmotility 07/04/2011  . Chronic respiratory failure with hypoxia   . Anxiety   . Paroxysmal atrial fibrillation 08/29/2012  . Partial small bowel obstruction 09/02/2012  . Bilateral lower extremity edema 09/03/2012    Possibly secondary to calcium channel blocker.   Past Surgical History  Procedure Laterality Date  . Hernia repair      X 3  . Spinal surgeries      X 4  . Appendectomy    . Prostate surgery     Family History  Problem Relation Age of Onset  . Lung disease Mother   . Prostate cancer Father    History  Substance Use Topics  . Smoking status: Current Every Day Smoker -- 0.50 packs/day    Types:  Cigarettes  . Smokeless tobacco: Current User  . Alcohol Use: Yes     Comment: Occasional    Review of Systems  Constitutional: Negative for fever.  Respiratory: Positive for shortness of breath.   Cardiovascular: Positive for chest pain.  Gastrointestinal: Positive for abdominal pain.  Neurological: Positive for headaches.  Psychiatric/Behavioral: The patient is nervous/anxious.   All other systems reviewed and are negative.    Allergies  Aspirin  Home Medications   Current Outpatient Rx  Name  Route  Sig  Dispense  Refill  . albuterol (PROVENTIL HFA;VENTOLIN HFA) 108 (90 BASE) MCG/ACT inhaler   Inhalation   Inhale 2 puffs into the lungs every 6 (six) hours as needed for wheezing.         [name] albuterol (PROVENTIL) (2.5 MG/3ML) 0.083% nebulizer solution   Nebulization   Take 3 mLs (2.5 mg total) by nebulization 4 (four) times daily.   75 mL      . ALPRAZolam (XANAX) 0.25 MG tablet   Oral   Take 0.25 mg by mouth 3 (three) times daily as needed for anxiety.         . budesonide-formoterol (SYMBICORT) 160-4.5 MCG/ACT inhaler   Inhalation   Inhale 2 puffs into the lungs 2 (two) times daily.   1 Inhaler   2   . oxyCODONE-acetaminophen (PERCOCET/ROXICET) 5-325 MG per tablet   Oral  Take 1 tablet by mouth 2 (two) times daily.         [name] diltiazem (CARDIZEM CD) 180 MG 24 hr capsule   Oral   Take 1 capsule (180 mg total) by mouth daily. For control of your heart rate.   30 capsule   2   . furosemide (LASIX) 20 MG tablet   Oral   Take 20 mg by mouth daily. For swelling in your feet and legs. Take potassium supplement with this medicine.         [name] guaiFENesin (MUCINEX) 600 MG 12 hr tablet   Oral   Take 2 tablets (1,200 mg total) by mouth 2 (two) times daily.   60 tablet   0   . hydroxypropyl methylcellulose (ISOPTO TEARS) 2.5 % ophthalmic solution   Both Eyes   Place 1 drop into both eyes daily as needed (Dry Eye).         . metFORMIN (GLUCOPHAGE) 1000  MG tablet   Oral   Take 1,000 mg by mouth 2 (two) times daily.         . nicotine (NICODERM CQ - DOSED IN MG/24 HOURS) 14 mg/24hr patch   Transdermal   Place 1 patch onto the skin daily as needed (nicotine withdrawal).   28 patch   0   . pantoprazole (PROTONIX) 40 MG tablet   Oral   Take 1 tablet (40 mg total) by mouth daily.   30 tablet   0   . potassium chloride (K-DUR,KLOR-CON) 20 MEQ tablet   Oral   Take 1 tablet (20 mEq total) by mouth daily. Take potassium supplement with furosemide (Lasix).   30 tablet   2   . predniSONE (DELTASONE) 10 MG tablet      Start 6/27. 6/27-6/29: Take 40 mg in the morning with breakfast daily. 6/30-7/2: Take 20 mg by mouth in the morning with breakfast. 7/3-7/5: Take 10 mg by mouth daily then stop.   21 tablet   0   . sennosides-docusate sodium (SENOKOT-S) 8.6-50 MG tablet   Oral   Take 1-2 tablets by mouth daily. Take daily to avoid constipation.         . sorbitol 70 % SOLN   Oral   Take 30 mLs by mouth daily as needed.   500 mL   0   . Tamsulosin HCl (FLOMAX) 0.4 MG CAPS   Oral   Take 1 capsule (0.4 mg total) by mouth daily after supper.   30 capsule   0   . tiotropium (SPIRIVA) 18 MCG inhalation capsule   Inhalation   Place 18 mcg into inhaler and inhale daily.           . traZODone (DESYREL) 25 mg TABS   Oral   Take 0.5 tablets (25 mg total) by mouth at bedtime as needed (insomnia).   30 tablet   0    BP 148/97  Pulse 100  Temp(Src) 97.6 F (36.4 C) (Oral)  Resp 26  Ht 5\' 4"  (1.626 m)  SpO2 96% Physical Exam CONSTITUTIONAL: chronically ill, anxious HEAD: Normocephalic/atraumatic EYES: EOMI/PERRL ENMT: Mucous membranes moist NECK: supple no meningeal signs SPINE:No bruising/crepitance/stepoffs noted to spine CV: S1/S2 noted, no murmurs/rubs/gallops noted LUNGS: wheezing noted bilatearlly ABDOMEN: soft, nontender, no rebound or guarding GU:no cva tenderness NEURO: Pt is awake/alert, moves all  extremitiesx4, no focal weakness EXTREMITIES: pulses normal, full ROM. No deformity noted.  Abrasions to left knee.  No deformity.  Tenderness to palpation of left knee,  All other extremities/joints palpated/ranged and nontender SKIN: warm, color normal PSYCH: anxious  ED Course  Procedures  Labs Reviewed  COMPREHENSIVE METABOLIC PANEL  ACETAMINOPHEN LEVEL  ETHANOL  URINE RAPID DRUG SCREEN (HOSP PERFORMED)  SALICYLATE LEVEL  TROPONIN I  3:08 PM Pt here for multiple complaints including CP/SOB/anxiety.  He reports he had syncopal episode today He also reports he "can't take it anymore" and may hurt himself Medical and psych workup initiated 3:57 PM Suspect most of his symptoms are from anxiety At signout, f/u on labs.  May need telepsych evaluation Signed out to dr zammit at shift change MDM  Nursing notes including past medical history and social history reviewed and considered in documentation    Date: 02/15/2013  Rate: 103  Rhythm: sinus tachycardia  QRS Axis: normal  Intervals: normal  ST/T Wave abnormalities: normal  Conduction Disutrbances:none    Joya Gaskins, MD 02/15/13 1558

## 2013-02-15 NOTE — ED Notes (Signed)
Requesting "oxycodone" and xanax - states he takes these daily.  EDP notified and orders rec'd.

## 2013-02-15 NOTE — ED Notes (Signed)
Per Dr. Estell Harpin, pt no longer needs a sitter. Social Worker to be called to make arrangements for pt to have home care

## 2013-02-15 NOTE — ED Notes (Signed)
Pt states he became anxious and then his chest started hurting.

## 2013-02-15 NOTE — ED Provider Notes (Addendum)
  The pt was seen by the psych md and he stated the pt should be admitted for depression  Benny Lennert, MD 02/15/13 2106

## 2013-02-15 NOTE — ED Provider Notes (Signed)
This chart was scribed for Benny Lennert, MD, by Candelaria Stagers, ED Scribe. This patient was seen in room APA15/APA15 and the patient's care was started at 4:57 PM   HPI Comments: [name] is a 72 y.o. male who presents to the Emergency Department complaining of anxiety.  Pt denies SI.  He states that he feels "out of it" mentally.  Nurse reports he has no family and lives alone.  Nurse reports pt stated he needs help at home.   A complete 10 system review of systems was obtained and all systems are negative except as noted in the HPI and PMH.   CONSTITUTIONAL: Well developed/well nourished HEAD: Normocephalic/atraumatic EYES: EOMI/PERRL NECK: supple no meningeal signs LUNGS:  no apparent distress NEURO: Pt is awake/alert, moves all extremitiesx4 EXTREMITIES: pulses normal, full ROM SKIN: warm, color normal PSYCH: no abnormalities of mood noted  4:58 PM Pt reports he still feels off balance.  Will order telepsych and consult with social worker.  Pt understands and agrees.   8:57 PM Consult with psychiatry who recommends pt be admitted.     Benny Lennert, MD 02/15/13 2106

## 2013-02-15 NOTE — ED Notes (Signed)
Per EDP, pt is suicidal and needs a Comptroller. Pt denies being suicidal. Pt moved to hallway 8. Sitter at bedside

## 2013-02-16 ENCOUNTER — Other Ambulatory Visit: Payer: Self-pay

## 2013-02-16 ENCOUNTER — Emergency Department (HOSPITAL_COMMUNITY): Payer: Medicare Other

## 2013-02-16 LAB — GLUCOSE, CAPILLARY
Glucose-Capillary: 103 mg/dL — ABNORMAL HIGH (ref 70–99)
Glucose-Capillary: 122 mg/dL — ABNORMAL HIGH (ref 70–99)

## 2013-02-16 MED ORDER — METFORMIN HCL 500 MG PO TABS
ORAL_TABLET | ORAL | Status: AC
  Filled 2013-02-16: qty 2

## 2013-02-16 MED ORDER — OXYCODONE-ACETAMINOPHEN 5-325 MG PO TABS
ORAL_TABLET | ORAL | Status: AC
  Administered 2013-02-16: 1 via ORAL
  Filled 2013-02-16: qty 1

## 2013-02-16 MED ORDER — OXYCODONE-ACETAMINOPHEN 5-325 MG PO TABS
ORAL_TABLET | ORAL | Status: AC
  Filled 2013-02-16: qty 1

## 2013-02-16 MED ORDER — POLYVINYL ALCOHOL 1.4 % OP SOLN
1.0000 [drp] | Freq: Every day | OPHTHALMIC | Status: DC | PRN
  Filled 2013-02-16: qty 15

## 2013-02-16 NOTE — ED Notes (Signed)
Talked to [place] (social work).  She is to meet with the patient this a.m

## 2013-02-16 NOTE — ED Notes (Signed)
Diannia Ruder (social work) spoke with patient on the phone.  States that pt is refusing care.  Notified edp

## 2013-02-16 NOTE — ED Notes (Signed)
Patient complaining of headache and requesting pain medication. Order for percocet to be given.

## 2013-02-16 NOTE — ED Notes (Signed)
Gave diet coke

## 2013-02-16 NOTE — BH Assessment (Signed)
Assessment Note   [name] is an 72 y.o. male. The patient came to the ED with complaints of increased depression and deteritation of his health. He states he can not care for himself. When Dr Bebe Shaggy spoke with the patient he told him he was wanting to die. He would not repeat this to anyone else. When he was tele-psyched,  He told the psychiatrist that he did not care if he lived. He is not homicidal. He is agitated at times. He is not hallucinated. Patient is IVC'ed. Patient referred to [facility]. This was requested thorough tele-psych.  Axis I: Major Depression, Recurrent severe Axis II: Deferred Axis III:  Past Medical History  Diagnosis Date  . Essential hypertension, benign   . Alcohol abuse   . PTSD (post-traumatic stress disorder)   . Chronic back pain   . Depression   . Peripheral neuropathy   . DJD (degenerative joint disease)   . COPD (chronic obstructive pulmonary disease)   . Anemia   . Myocardial infarction   . Type II or unspecified type diabetes mellitus without mention of complication, not stated as uncontrolled   . Esophageal dysmotility 07/04/2011  . Chronic respiratory failure with hypoxia   . Anxiety   . Paroxysmal atrial fibrillation 08/29/2012  . Partial small bowel obstruction 09/02/2012  . Bilateral lower extremity edema 09/03/2012    Possibly secondary to calcium channel blocker.   Axis IV: housing problems, problems with access to health care services and problems with primary support group Axis V: 31-40 impairment in reality testing  Past Medical History:  Past Medical History  Diagnosis Date  . Essential hypertension, benign   . Alcohol abuse   . PTSD (post-traumatic stress disorder)   . Chronic back pain   . Depression   . Peripheral neuropathy   . DJD (degenerative joint disease)   . COPD (chronic obstructive pulmonary disease)   . Anemia   . Myocardial infarction   . Type II or unspecified type diabetes mellitus  without mention of complication, not stated as uncontrolled   . Esophageal dysmotility 07/04/2011  . Chronic respiratory failure with hypoxia   . Anxiety   . Paroxysmal atrial fibrillation 08/29/2012  . Partial small bowel obstruction 09/02/2012  . Bilateral lower extremity edema 09/03/2012    Possibly secondary to calcium channel blocker.    Past Surgical History  Procedure Laterality Date  . Hernia repair      X 3  . Spinal surgeries      X 4  . Appendectomy    . Prostate surgery      Family History:  Family History  Problem Relation Age of Onset  . Lung disease Mother   . Prostate cancer Father     Social History:  reports that he has been smoking Cigarettes.  He has been smoking about 0.50 packs per day. He uses smokeless tobacco. He reports that  drinks alcohol. He reports that he does not use illicit drugs.  Additional Social History:     CIWA: CIWA-Ar BP: 141/56 mmHg Pulse Rate: 87 COWS:    Allergies:  Allergies  Allergen Reactions  . Aspirin Other (See Comments)    Stomach upset    Home Medications:  (Not in a hospital admission)  OB/GYN Status:  No LMP for male patient.  General Assessment Data Location of Assessment: [facility] ACT Assessment: Yes Living Arrangements: Alone Can pt return to current living arrangement?: No Admission Status: Involuntary Is patient capable of  signing voluntary admission?: No Transfer from: Acute Hospital Referral Source: MD  Education Status Is patient currently in school?: No  Risk to self Suicidal Ideation: Yes-Currently Present Suicidal Intent: Yes-Currently Present Is patient at risk for suicide?: Yes Suicidal Plan?: No Access to Means: No What has been your use of drugs/alcohol within the last 12 months?: none Previous Attempts/Gestures: No How many times?: 0 Other Self Harm Risks: none Triggers for Past Attempts: None known Intentional Self Injurious Behavior: None Family Suicide History: Unknown Recent  stressful life event(s): Recent negative physical changes Persecutory voices/beliefs?: No Depression: Yes Depression Symptoms: Despondent;Insomnia;Isolating;Loss of interest in usual pleasures;Feeling worthless/self pity;Feeling angry/irritable Substance abuse history and/or treatment for substance abuse?: No Suicide prevention information given to non-admitted patients: Not applicable  Risk to Others Homicidal Ideation: No Thoughts of Harm to Others: No Current Homicidal Intent: No Current Homicidal Plan: No Access to Homicidal Means: No History of harm to others?: No Assessment of Violence: None Noted Does patient have access to weapons?: No Criminal Charges Pending?: No Does patient have a court date: No  Psychosis Hallucinations: None noted Delusions: None noted  Mental Status Report Appear/Hygiene: Disheveled;Poor hygiene Eye Contact: Fair Motor Activity: Freedom of movement Speech: Argumentative Level of Consciousness: Alert;Irritable Mood: Depressed;Angry;Sad Affect: Angry;Depressed;Irritable;Sad Anxiety Level: Moderate Thought Processes: Circumstantial Judgement: Impaired Orientation: Person;Place;Time Obsessive Compulsive Thoughts/Behaviors: Moderate  Cognitive Functioning Concentration: Decreased Memory: Recent Impaired;Remote Impaired IQ: Average Insight: Poor Impulse Control: Poor Appetite: Fair Sleep: Decreased Vegetative Symptoms: Not bathing;Decreased grooming  ADLScreening [facility] Assessment Services) Patient's cognitive ability adequate to safely complete daily activities?: Yes Patient able to express need for assistance with ADLs?: Yes Independently performs ADLs?: Yes (appropriate for developmental age)  Abuse/Neglect [facility]) Physical Abuse: Denies Verbal Abuse: Denies Sexual Abuse: Denies  Prior Inpatient Therapy Prior Inpatient Therapy: No Prior Therapy Dates: unknown Prior Therapy Facilty/Provider(s): unknown Reason for Treatment: suicidal  depression  Prior Outpatient Therapy Prior Outpatient Therapy: Yes Prior Therapy Dates: current Prior Therapy Facilty/Provider(s): [facility]  Reason for Treatment: medications  ADL Screening (condition at time of admission) Patient's cognitive ability adequate to safely complete daily activities?: Yes Patient able to express need for assistance with ADLs?: Yes Independently performs ADLs?: Yes (appropriate for developmental age)  Home Assistive Devices/Equipment Home Assistive Devices/Equipment: Dan Humphreys (specify type);Cane (specify quad or straight)    Abuse/Neglect Assessment (Assessment to be complete while patient is alone) Physical Abuse: Denies Verbal Abuse: Denies Sexual Abuse: Denies Values / Beliefs Cultural Requests During Hospitalization: None Spiritual Requests During Hospitalization: None        Additional Information 1:1 In Past 12 Months?: No CIRT Risk: No Elopement Risk: No Does patient have medical clearance?: Yes     Disposition:  Disposition Initial Assessment Completed for this Encounter: Yes Disposition of Patient: Inpatient treatment program;Referred to Type of inpatient treatment program: Adult Patient referred to:  [facility])  On Site Evaluation by:   Reviewed with Physician:     Jearld Pies 02/16/2013 1:31 PM

## 2013-02-16 NOTE — Clinical Social Work Note (Signed)
CSW spoke with RN, Hospital doctor who reports pt told ACT team that he would be willing to go for placement. CSW spoke with pt on phone and he states he remembers CSW well from previous admission. Pt said he came to ED because he was having a "nerve problem." He indicates he takes Xanax at home. Per RN, pt has been cleared by ACT and was being held as he agreed to long term placement. CSW brought this up to pt and before CSW could complete question, pt said he was not going and that the ED staff was confused if they thought that's what he said. Pt has declined ALF many times in the past as an inpatient. CSW notified RN of pt's decision and she is aware to contact CSW if he changes his mind.   Derenda Fennel, [place] [phone]

## 2013-02-16 NOTE — ED Notes (Signed)
Patient allowed to take shower. Escorted by Comptroller. Patient placed in paper scrubs and room cleared of monitor and all wires. Sitter remains at bedside. Money counted from patient's wallet and collected by security.

## 2013-02-17 LAB — URINALYSIS, ROUTINE W REFLEX MICROSCOPIC
Nitrite: NEGATIVE
Protein, ur: NEGATIVE mg/dL
Urobilinogen, UA: 0.2 mg/dL (ref 0.0–1.0)

## 2013-02-17 LAB — CBC WITH DIFFERENTIAL/PLATELET
Eosinophils Absolute: 0.3 10*3/uL (ref 0.0–0.7)
Hemoglobin: 10.1 g/dL — ABNORMAL LOW (ref 13.0–17.0)
Lymphocytes Relative: 18 % (ref 12–46)
Lymphs Abs: 2 10*3/uL (ref 0.7–4.0)
Neutro Abs: 8.2 10*3/uL — ABNORMAL HIGH (ref 1.7–7.7)
Neutrophils Relative %: 73 % (ref 43–77)
Platelets: 247 10*3/uL (ref 150–400)
RBC: 3.46 MIL/uL — ABNORMAL LOW (ref 4.22–5.81)
WBC: 11.2 10*3/uL — ABNORMAL HIGH (ref 4.0–10.5)

## 2013-02-17 LAB — URINE MICROSCOPIC-ADD ON

## 2013-02-17 LAB — GLUCOSE, CAPILLARY
Glucose-Capillary: 104 mg/dL — ABNORMAL HIGH (ref 70–99)
Glucose-Capillary: 95 mg/dL (ref 70–99)
Glucose-Capillary: 111 mg/dL — ABNORMAL HIGH (ref 70–99)

## 2013-02-17 MED ORDER — OXYCODONE-ACETAMINOPHEN 5-325 MG PO TABS
ORAL_TABLET | ORAL | Status: AC
  Filled 2013-02-17: qty 1

## 2013-02-17 MED ORDER — ARIPIPRAZOLE 2 MG PO TABS
2.0000 mg | ORAL_TABLET | Freq: Every day | ORAL | Status: DC
  Administered 2013-02-17: 2 mg via ORAL
  Filled 2013-02-17 (×3): qty 1

## 2013-02-17 MED ORDER — PAROXETINE HCL 10 MG PO TABS
ORAL_TABLET | ORAL | Status: AC
  Filled 2013-02-17: qty 1

## 2013-02-17 MED ORDER — ALPRAZOLAM 0.5 MG PO TABS: 0.5000 mg | ORAL_TABLET | Freq: Two times a day (BID) | ORAL | Status: DC | PRN

## 2013-02-17 MED ORDER — PAROXETINE HCL 20 MG PO TABS
ORAL_TABLET | ORAL | Status: AC
  Filled 2013-02-17: qty 1

## 2013-02-17 MED ORDER — PAROXETINE HCL 20 MG PO TABS
20.0000 mg | ORAL_TABLET | Freq: Every day | ORAL | Status: DC
  Administered 2013-02-17 – 2013-02-18 (×2): 20 mg via ORAL
  Filled 2013-02-17 (×4): qty 1

## 2013-02-17 NOTE — ED Notes (Signed)
Glucose 104. Eating breakfast. Denies any needs. Metformin given per order.

## 2013-02-17 NOTE — ED Notes (Signed)
Pt asking to shave, or have someone shave him. Pt reports if he is given a shower he would feel better. Attempting to make arrangements with male staff at this time.

## 2013-02-17 NOTE — ED Notes (Signed)
Pt took dinner-time medications without incidence. Pt eating dinner, and is cooperative at this time.

## 2013-02-17 NOTE — ED Notes (Signed)
CBC results faxed to [facility] per request of admissions at [facility]

## 2013-02-17 NOTE — ED Notes (Signed)
Telepsych completed, pt feels anxious at this time. Pt asking how soon he can leave. Pt reports will not come here again as he is tired of " [facility] [place]".  Pt asked if he would like something to help him feel better. EDP notified of pt's increasing anxiety.

## 2013-02-17 NOTE — ED Notes (Signed)
[facility] Nurse called to see if we still needed placement for the patient. She states that everything she had looked good on the patient but she needed a CBC result. When looking for the result, realized that a CBC was never obtained on the patient. Dr. Preston Fleeting notified and orders to draw CBC were given.

## 2013-02-17 NOTE — ED Notes (Signed)
Patient complaining of inability to sleep. Requesting trazodone medication. PRN order to be given.

## 2013-02-17 NOTE — Progress Notes (Signed)
[facility] requested more information to consider for inpatient admission: Today's VS, UA, today's doctors notes, a D/C summary from pt's previous admission on 6/23 and a chest CT. Chest CT had been done on June 2014 and Dr. Bernette Mayers did not think it prudent to order another chest CT at this time. Enclosed copy of that CT in the fax that was sent to [facility]. Also sent a copy of the telepsych that was done this afternoon. Pt had expressed interest in returning to his home in [place], but psychiatrist determined that he needs to be observed in inpatient psych hospital and also made medication recommendations for depression.

## 2013-02-17 NOTE — ED Notes (Signed)
Patient agitated and stating "I have PTSD and I am stuck in this rat hole." Patient given PRN order of ativan 1mg . Also gave patient ginger ale and saltine crackers as requested.

## 2013-02-17 NOTE — ED Provider Notes (Signed)
The [facility] psychiatrist has recommended Paxil 20 mg by mouth daily, Abilify 2 mg by mouth nightly, Xanax 0.5 mg by mouth twice daily as needed. The patient is under involuntary commitment, he appears cooperative, he is not overly agitated, attempting placement ongoing at this time as the psychiatrist believes the patient is to be admitted to psychiatry unit  Vida Roller, MD 02/17/13 1714

## 2013-02-17 NOTE — ED Notes (Signed)
Pt asking to shave. Male RN and security notified of pt's request.  Pt is calm and cooperative at this time.

## 2013-02-17 NOTE — ED Notes (Signed)
Patient resting in bed with eyes closed. NAD noted. ?

## 2013-02-18 ENCOUNTER — Institutional Professional Consult (permissible substitution): Payer: Medicare Other | Admitting: Pulmonary Disease

## 2013-02-18 MED ORDER — ARIPIPRAZOLE 2 MG PO TABS: 2.0000 mg | ORAL_TABLET | Freq: Every day | ORAL | Status: DC

## 2013-02-18 MED ORDER — METFORMIN HCL 500 MG PO TABS
ORAL_TABLET | ORAL | Status: AC
  Administered 2013-02-18: 1000 mg via ORAL
  Filled 2013-02-18: qty 2

## 2013-02-18 MED ORDER — ALPRAZOLAM 0.5 MG PO TABS
ORAL_TABLET | ORAL | Status: AC
  Administered 2013-02-18: 0.25 mg via ORAL
  Filled 2013-02-18: qty 1

## 2013-02-18 MED ORDER — ALPRAZOLAM 0.5 MG PO TABS: 0.5000 mg | ORAL_TABLET | Freq: Two times a day (BID) | ORAL | Status: DC

## 2013-02-18 MED ORDER — PANTOPRAZOLE SODIUM 40 MG PO TBEC
DELAYED_RELEASE_TABLET | ORAL | Status: AC
  Administered 2013-02-18: 40 mg via ORAL
  Filled 2013-02-18: qty 1

## 2013-02-18 MED ORDER — ALBUTEROL SULFATE HFA 108 (90 BASE) MCG/ACT IN AERS
INHALATION_SPRAY | RESPIRATORY_TRACT | Status: AC
  Filled 2013-02-18: qty 6.7

## 2013-02-18 MED ORDER — POTASSIUM CHLORIDE CRYS ER 20 MEQ PO TBCR
EXTENDED_RELEASE_TABLET | ORAL | Status: AC
  Administered 2013-02-18: 20 meq via ORAL
  Filled 2013-02-18: qty 1

## 2013-02-18 MED ORDER — FUROSEMIDE 40 MG PO TABS
ORAL_TABLET | ORAL | Status: AC
  Administered 2013-02-18: 20 mg via ORAL
  Filled 2013-02-18: qty 1

## 2013-02-18 MED ORDER — PAROXETINE HCL 20 MG PO TABS: 20.0000 mg | ORAL_TABLET | ORAL | Status: DC

## 2013-02-18 MED ORDER — OXYCODONE-ACETAMINOPHEN 5-325 MG PO TABS
ORAL_TABLET | ORAL | Status: AC
  Administered 2013-02-18: 1 via ORAL
  Filled 2013-02-18: qty 1

## 2013-02-18 NOTE — ED Provider Notes (Signed)
Patient is not suicidal, homicidal, psychotic. We'll discharge.   Will start prescriptions recommended by psychiatrist including Paxil 20 mg daily, Abilify 2 mg nightly, Xanax 0.5 mg twice a day.   Enough of meds written for 2 weeks. Patient given resource guide for community mental health followup    Donnetta Hutching, MD 02/18/13 [phone]

## 2013-02-18 NOTE — ED Notes (Signed)
Pt denies suicidal ideation.  States, "I said something to the doctor about being tired and they kept me here".  Pt denies suicidal ideations presently and states "I feel good".

## 2013-02-18 NOTE — ED Notes (Signed)
Left in c/o [facility] for transport home.

## 2013-03-17 ENCOUNTER — Emergency Department (HOSPITAL_COMMUNITY)
Admission: EM | Admit: 2013-03-17 | Discharge: 2013-03-23 | Disposition: A | Discharge status: Home / Self Care | Payer: Medicare Other | Attending: Emergency Medicine | Admitting: Emergency Medicine

## 2013-03-17 ENCOUNTER — Encounter (HOSPITAL_COMMUNITY): Payer: Self-pay | Admitting: Emergency Medicine

## 2013-03-17 ENCOUNTER — Emergency Department (HOSPITAL_COMMUNITY): Payer: Medicare Other

## 2013-03-17 DIAGNOSIS — Z862 Personal history of diseases of the blood and blood-forming organs and certain disorders involving the immune mechanism: Secondary | ICD-10-CM | POA: Insufficient documentation

## 2013-03-17 DIAGNOSIS — F329 Major depressive disorder, single episode, unspecified: Secondary | ICD-10-CM

## 2013-03-17 DIAGNOSIS — R079 Chest pain, unspecified: Secondary | ICD-10-CM | POA: Insufficient documentation

## 2013-03-17 DIAGNOSIS — Z8739 Personal history of other diseases of the musculoskeletal system and connective tissue: Secondary | ICD-10-CM | POA: Insufficient documentation

## 2013-03-17 DIAGNOSIS — J441 Chronic obstructive pulmonary disease with (acute) exacerbation: Secondary | ICD-10-CM | POA: Insufficient documentation

## 2013-03-17 DIAGNOSIS — Z91199 Patient's noncompliance with other medical treatment and regimen due to unspecified reason: Secondary | ICD-10-CM | POA: Insufficient documentation

## 2013-03-17 DIAGNOSIS — F17200 Nicotine dependence, unspecified, uncomplicated: Secondary | ICD-10-CM | POA: Insufficient documentation

## 2013-03-17 DIAGNOSIS — R059 Cough, unspecified: Secondary | ICD-10-CM | POA: Insufficient documentation

## 2013-03-17 DIAGNOSIS — Z9981 Dependence on supplemental oxygen: Secondary | ICD-10-CM | POA: Insufficient documentation

## 2013-03-17 DIAGNOSIS — R000 Tachycardia, unspecified: Secondary | ICD-10-CM | POA: Insufficient documentation

## 2013-03-17 DIAGNOSIS — Z8719 Personal history of other diseases of the digestive system: Secondary | ICD-10-CM | POA: Insufficient documentation

## 2013-03-17 DIAGNOSIS — R0789 Other chest pain: Secondary | ICD-10-CM | POA: Insufficient documentation

## 2013-03-17 DIAGNOSIS — F32A Depression, unspecified: Secondary | ICD-10-CM

## 2013-03-17 DIAGNOSIS — E119 Type 2 diabetes mellitus without complications: Secondary | ICD-10-CM | POA: Insufficient documentation

## 2013-03-17 DIAGNOSIS — F3289 Other specified depressive episodes: Secondary | ICD-10-CM | POA: Insufficient documentation

## 2013-03-17 DIAGNOSIS — Z8709 Personal history of other diseases of the respiratory system: Secondary | ICD-10-CM | POA: Insufficient documentation

## 2013-03-17 DIAGNOSIS — F4310 Post-traumatic stress disorder, unspecified: Secondary | ICD-10-CM

## 2013-03-17 DIAGNOSIS — Z79899 Other long term (current) drug therapy: Secondary | ICD-10-CM | POA: Insufficient documentation

## 2013-03-17 DIAGNOSIS — I10 Essential (primary) hypertension: Secondary | ICD-10-CM | POA: Insufficient documentation

## 2013-03-17 DIAGNOSIS — F411 Generalized anxiety disorder: Secondary | ICD-10-CM | POA: Insufficient documentation

## 2013-03-17 DIAGNOSIS — Z720 Tobacco use: Secondary | ICD-10-CM

## 2013-03-17 DIAGNOSIS — R062 Wheezing: Secondary | ICD-10-CM | POA: Insufficient documentation

## 2013-03-17 DIAGNOSIS — Z9119 Patient's noncompliance with other medical treatment and regimen: Secondary | ICD-10-CM | POA: Insufficient documentation

## 2013-03-17 DIAGNOSIS — F419 Anxiety disorder, unspecified: Secondary | ICD-10-CM

## 2013-03-17 DIAGNOSIS — Z8669 Personal history of other diseases of the nervous system and sense organs: Secondary | ICD-10-CM | POA: Insufficient documentation

## 2013-03-17 DIAGNOSIS — I4891 Unspecified atrial fibrillation: Secondary | ICD-10-CM | POA: Insufficient documentation

## 2013-03-17 DIAGNOSIS — R05 Cough: Secondary | ICD-10-CM | POA: Insufficient documentation

## 2013-03-17 DIAGNOSIS — IMO0002 Reserved for concepts with insufficient information to code with codable children: Secondary | ICD-10-CM | POA: Insufficient documentation

## 2013-03-17 DIAGNOSIS — I252 Old myocardial infarction: Secondary | ICD-10-CM | POA: Insufficient documentation

## 2013-03-17 LAB — CBC WITH DIFFERENTIAL/PLATELET
Basophils Relative: 0 % (ref 0–1)
Eosinophils Absolute: 0.1 10*3/uL (ref 0.0–0.7)
Eosinophils Relative: 1 % (ref 0–5)
Hemoglobin: 12 g/dL — ABNORMAL LOW (ref 13.0–17.0)
Lymphs Abs: 1.5 10*3/uL (ref 0.7–4.0)
MCH: 29.7 pg (ref 26.0–34.0)
MCHC: 34.5 g/dL (ref 30.0–36.0)
MCV: 86.1 fL (ref 78.0–100.0)
Monocytes Absolute: 0.4 10*3/uL (ref 0.1–1.0)
Monocytes Relative: 3 % (ref 3–12)
RBC: 4.04 MIL/uL — ABNORMAL LOW (ref 4.22–5.81)

## 2013-03-17 LAB — RAPID URINE DRUG SCREEN, HOSP PERFORMED
Benzodiazepines: NOT DETECTED
Opiates: NOT DETECTED

## 2013-03-17 LAB — URINALYSIS, ROUTINE W REFLEX MICROSCOPIC
Ketones, ur: NEGATIVE mg/dL
Leukocytes, UA: NEGATIVE
Nitrite: NEGATIVE
Specific Gravity, Urine: >1.03 — ABNORMAL HIGH (ref 1.005–1.030)
pH: 5.5 (ref 5.0–8.0)

## 2013-03-17 LAB — COMPREHENSIVE METABOLIC PANEL
Alkaline Phosphatase: 95 U/L (ref 39–117)
BUN: 44 mg/dL — ABNORMAL HIGH (ref 6–23)
Calcium: 9.7 mg/dL (ref 8.4–10.5)
Creatinine, Ser: 1.63 mg/dL — ABNORMAL HIGH (ref 0.50–1.35)
GFR calc Af Amer: 48 mL/min — ABNORMAL LOW (ref >90)
Glucose, Bld: 141 mg/dL — ABNORMAL HIGH (ref 70–99)
Total Protein: 7.5 g/dL (ref 6.0–8.3)

## 2013-03-17 LAB — URINE MICROSCOPIC-ADD ON

## 2013-03-17 LAB — ETHANOL: Alcohol, Ethyl (B): <11 mg/dL (ref 0–11)

## 2013-03-17 MED ORDER — BUDESONIDE-FORMOTEROL FUMARATE 160-4.5 MCG/ACT IN AERO
INHALATION_SPRAY | RESPIRATORY_TRACT | Status: AC
  Filled 2013-03-17: qty 6

## 2013-03-17 MED ORDER — PANTOPRAZOLE SODIUM 40 MG PO TBEC
40.0000 mg | DELAYED_RELEASE_TABLET | Freq: Every day | ORAL | Status: DC
  Administered 2013-03-17 – 2013-03-22 (×5): 40 mg via ORAL
  Filled 2013-03-17 (×3): qty 1

## 2013-03-17 MED ORDER — PREDNISONE 20 MG PO TABS
40.0000 mg | ORAL_TABLET | Freq: Once | ORAL | Status: AC
  Administered 2013-03-17: 40 mg via ORAL
  Filled 2013-03-17: qty 2

## 2013-03-17 MED ORDER — TRAZODONE HCL 50 MG PO TABS
ORAL_TABLET | ORAL | Status: AC
  Filled 2013-03-17: qty 1

## 2013-03-17 MED ORDER — ARIPIPRAZOLE 2 MG PO TABS
2.0000 mg | ORAL_TABLET | Freq: Every day | ORAL | Status: DC
  Administered 2013-03-17 – 2013-03-23 (×7): 2 mg via ORAL
  Filled 2013-03-17 (×7): qty 1

## 2013-03-17 MED ORDER — PAROXETINE HCL 20 MG PO TABS
ORAL_TABLET | ORAL | Status: AC
  Filled 2013-03-17: qty 1

## 2013-03-17 MED ORDER — ARIPIPRAZOLE 2 MG PO TABS
ORAL_TABLET | ORAL | Status: AC
  Filled 2013-03-17: qty 1

## 2013-03-17 MED ORDER — SODIUM CHLORIDE 0.9 % IV BOLUS (SEPSIS)
1000.0000 mL | Freq: Once | INTRAVENOUS | Status: AC
  Administered 2013-03-17: 1000 mL via INTRAVENOUS

## 2013-03-17 MED ORDER — ALBUTEROL SULFATE HFA 108 (90 BASE) MCG/ACT IN AERS
2.0000 | INHALATION_SPRAY | Freq: Four times a day (QID) | RESPIRATORY_TRACT | Status: DC | PRN
  Administered 2013-03-22: 2 via RESPIRATORY_TRACT

## 2013-03-17 MED ORDER — BUDESONIDE-FORMOTEROL FUMARATE 160-4.5 MCG/ACT IN AERO
2.0000 | INHALATION_SPRAY | Freq: Two times a day (BID) | RESPIRATORY_TRACT | Status: DC
  Administered 2013-03-17 – 2013-03-23 (×12): 2 via RESPIRATORY_TRACT
  Filled 2013-03-17: qty 6

## 2013-03-17 MED ORDER — ALPRAZOLAM 0.5 MG PO TABS
0.5000 mg | ORAL_TABLET | Freq: Two times a day (BID) | ORAL | Status: DC
  Administered 2013-03-17 – 2013-03-23 (×9): 0.5 mg via ORAL
  Filled 2013-03-17 (×7): qty 1

## 2013-03-17 MED ORDER — PAROXETINE HCL 20 MG PO TABS
20.0000 mg | ORAL_TABLET | Freq: Every day | ORAL | Status: DC
  Administered 2013-03-17 – 2013-03-23 (×7): 20 mg via ORAL
  Filled 2013-03-17 (×7): qty 1

## 2013-03-17 MED ORDER — ALBUTEROL SULFATE (5 MG/ML) 0.5% IN NEBU
5.0000 mg | INHALATION_SOLUTION | Freq: Once | RESPIRATORY_TRACT | Status: AC
  Administered 2013-03-17: 5 mg via RESPIRATORY_TRACT
  Filled 2013-03-17: qty 1

## 2013-03-17 MED ORDER — OXYCODONE-ACETAMINOPHEN 5-325 MG PO TABS
1.0000 | ORAL_TABLET | Freq: Two times a day (BID) | ORAL | Status: DC
  Administered 2013-03-17 – 2013-03-22 (×8): 1 via ORAL
  Filled 2013-03-17 (×4): qty 1

## 2013-03-17 MED ORDER — TRAZODONE HCL 50 MG PO TABS
25.0000 mg | ORAL_TABLET | Freq: Every evening | ORAL | Status: DC | PRN
  Administered 2013-03-17 – 2013-03-20 (×2): 25 mg via ORAL
  Filled 2013-03-17 (×3): qty 0.5

## 2013-03-17 NOTE — ED Notes (Signed)
Tele-psych consult completed.   

## 2013-03-17 NOTE — ED Provider Notes (Signed)
CSN: [id number]     Arrival date & time 03/17/13  1141 History  This chart was scribed for Vida Roller, MD by Bennett Scrape, ED Scribe. This patient was seen in room [facility] and the patient's care was started at 11:45 AM.   Chief Complaint  Patient presents with  . Shortness of Breath  . Depression  . Anxiety    The history is provided by the patient and the EMS personnel. No language interpreter was used.    HPI Comments: [name] is a 72 y.o. male with a h/o COPD brought in by ambulance, who presents to the Emergency Department complaining of worsening of chronic SOB with daily CP that is aggravated with coughing and deep breathing. He states that his cough has worsened over the past few weeks from baseline and has been occasionally productive after he started smoking daily again.  EMS reports that the pt was tachycardic en route. He was given albuterol with an improvement in his oxygen saturation from low 80s to were 87% on 2L O2. This further improved with an increase to 4L.  EMS reports that he is non-complaint and appears at his normal dyspneic baseline. He is normally on 2.5L of O2 at home. He has albuterol and singular inhalers at home but has not used them. Pt states that he has not been using his inhalers, because he currently doesn't care. Pt has multiple visits for SOB complaints with similar presentations.   He also c/o worsening depression. He was recently seen by [facility] at the end of July 2014 and prescribed Xanax, Zoloft and Paxil. He denies being out, but admits that he has been overdosing on the medications since last week. He is unable to give a reason why. He denies that it was a suicidal attempt but states that he doesn't care what happens to him. He volunteers that he hasn't showered in 6 days due to depression and having no motivation. Pt continually repeats that he doesn't care what happens to him.  Ex-[employer] with a h/o PTSD He lives alone, no pets, not  married  Past Medical History  Diagnosis Date  . Essential hypertension, benign   . Alcohol abuse   . PTSD (post-traumatic stress disorder)   . Chronic back pain   . Depression   . Peripheral neuropathy   . DJD (degenerative joint disease)   . COPD (chronic obstructive pulmonary disease)   . Anemia   . Myocardial infarction   . Type II or unspecified type diabetes mellitus without mention of complication, not stated as uncontrolled   . Esophageal dysmotility 07/04/2011  . Chronic respiratory failure with hypoxia   . Anxiety   . Paroxysmal atrial fibrillation 08/29/2012  . Partial small bowel obstruction 09/02/2012  . Bilateral lower extremity edema 09/03/2012    Possibly secondary to calcium channel blocker.   Past Surgical History  Procedure Laterality Date  . Hernia repair      X 3  . Spinal surgeries      X 4  . Appendectomy    . Prostate surgery     Family History  Problem Relation Age of Onset  . Lung disease Mother   . Prostate cancer Father    History  Substance Use Topics  . Smoking status: Current Every Day Smoker -- 0.50 packs/day    Types: Cigarettes  . Smokeless tobacco: Current User  . Alcohol Use: Yes     Comment: Occasional    Review of Systems  A  complete 10 system review of systems was obtained and all systems are negative except as noted in the HPI and PMH.   Allergies  Aspirin  Home Medications   Current Outpatient Rx  Name  Route  Sig  Dispense  Refill  . albuterol (PROVENTIL HFA;VENTOLIN HFA) 108 (90 BASE) MCG/ACT inhaler   Inhalation   Inhale 2 puffs into the lungs every 6 (six) hours as needed for wheezing.         [name] ALPRAZolam (XANAX) 0.25 MG tablet   Oral   Take 0.25 mg by mouth 3 (three) times daily as needed for anxiety.         . ALPRAZolam (XANAX) 0.5 MG tablet   Oral   Take 1 tablet (0.5 mg total) by mouth 2 (two) times daily.   28 tablet   0   . EXPIRED: ARIPiprazole (ABILIFY) 2 MG tablet   Oral   Take 1 tablet  (2 mg total) by mouth at bedtime.   30 tablet   2   . ARIPiprazole (ABILIFY) 2 MG tablet   Oral   Take 1 tablet (2 mg total) by mouth daily.   14 tablet   0     At bedtime   . budesonide-formoterol (SYMBICORT) 160-4.5 MCG/ACT inhaler   Inhalation   Inhale 2 puffs into the lungs 2 (two) times daily.   1 Inhaler   2   . diltiazem (CARDIZEM CD) 180 MG 24 hr capsule   Oral   Take 1 capsule (180 mg total) by mouth daily. For control of your heart rate.   30 capsule   2   . furosemide (LASIX) 20 MG tablet   Oral   Take 20 mg by mouth daily. For swelling in your feet and legs. Take potassium supplement with this medicine.         [name] guaiFENesin (MUCINEX) 600 MG 12 hr tablet   Oral   Take 2 tablets (1,200 mg total) by mouth 2 (two) times daily.   60 tablet   0   . hydroxypropyl methylcellulose (ISOPTO TEARS) 2.5 % ophthalmic solution   Both Eyes   Place 1 drop into both eyes daily as needed (Dry Eye).         . metFORMIN (GLUCOPHAGE) 1000 MG tablet   Oral   Take 1,000 mg by mouth 2 (two) times daily.         . nicotine (NICODERM CQ - DOSED IN MG/24 HOURS) 14 mg/24hr patch   Transdermal   Place 1 patch onto the skin daily as needed (nicotine withdrawal).   28 patch   0   . oxyCODONE-acetaminophen (PERCOCET/ROXICET) 5-325 MG per tablet   Oral   Take 1 tablet by mouth 2 (two) times daily.         . pantoprazole (PROTONIX) 40 MG tablet   Oral   Take 1 tablet (40 mg total) by mouth daily.   30 tablet   0   . PARoxetine (PAXIL) 20 MG tablet   Oral   Take 1 tablet (20 mg total) by mouth every morning.   14 tablet   0   . potassium chloride (K-DUR,KLOR-CON) 20 MEQ tablet   Oral   Take 1 tablet (20 mEq total) by mouth daily. Take potassium supplement with furosemide (Lasix).   30 tablet   2   . Tamsulosin HCl (FLOMAX) 0.4 MG CAPS   Oral   Take 1 capsule (0.4 mg total) by mouth daily after supper.  30 capsule   0   . traZODone (DESYREL) 25 mg TABS    Oral   Take 0.5 tablets (25 mg total) by mouth at bedtime as needed (insomnia).   30 tablet   0    Triage Vitals: BP 138/65  Pulse 106  Temp(Src) 97.6 F (36.4 C) (Oral)  Resp 18  SpO2 95%  Physical Exam  Nursing note and vitals reviewed. Constitutional: He is oriented to person, place, and time. He appears well-developed and well-nourished. No distress.  HENT:  Head: Normocephalic and atraumatic.  Eyes: EOM are normal.  Neck: Neck supple. No tracheal deviation present.  Cardiovascular: Regular rhythm.   Mild tachycardia, strong pulses  Pulmonary/Chest: Effort normal. No respiratory distress. He has wheezes (mild).  Speaks in full sentences, no accessory muscle use  Musculoskeletal: Normal range of motion.  Neurological: He is alert and oriented to person, place, and time.  Skin: Skin is warm and dry.  Psychiatric: His behavior is normal. His mood appears anxious. He exhibits a depressed mood.  The patient is anxious appearing, admits to increased depression, anhedonia, loss of motivation. He has not even be using his albuterol treatments because of lack of motivation and stating "I just don't care"    ED Course   Medications  albuterol (PROVENTIL) (5 MG/ML) 0.5% nebulizer solution 5 mg (5 mg Nebulization Given 03/17/13 1224)  predniSONE (DELTASONE) tablet 40 mg (40 mg Oral Given 03/17/13 1222)    DIAGNOSTIC STUDIES: Oxygen Saturation is 99% on 4L [place], normal by my interpretation.    COORDINATION OF CARE: 11:52 AM-Discussed treatment plan which includes monitoring the pt at 2.5L (baseline O2 use) with pt at bedside and pt agreed to plan.   Procedures (including critical care time)  Labs Reviewed - No data to display No results found. No diagnosis found.  MDM  The patient has increased depression, though he is on supplemental oxygen his saturations seemed to be at baseline. He has been wheezing and will receive albuterol treatments because of this. He has baseline COPD  requiring daily medications which we will provide. In addition he has been taking overdoses of his medications and cannot tell me exactly why he is doing this but it is concerning enough to require psychiatric evaluation. EKG is nonischemic, he has nonspecific ST and T wave abnormalities, it is not entirely different from prior EKGs, labs are pending.  ED ECG REPORT  I personally interpreted this EKG   Date: 03/17/2013   Rate: 100  Rhythm: sinus tachycardia  QRS Axis: normal  Intervals: normal  ST/T Wave abnormalities: nonspecific ST/T changes  Conduction Disutrbances:none  Narrative Interpretation:   Old EKG Reviewed: No significant changes since 02/16/2013, nonspecific T-wave changes slightly different  Change of shift - care signed out to Dr. Bebe Shaggy.  I personally performed the services described in this documentation, which was scribed in my presence. The recorded information has been reviewed and is accurate.        Vida Roller, MD 03/17/13 [phone]

## 2013-03-17 NOTE — ED Notes (Signed)
Called [facility] for Telepsych Consult.  They will call back with an appt time.

## 2013-03-17 NOTE — ED Notes (Signed)
Appt time of consult is 1630.

## 2013-03-17 NOTE — BH Assessment (Signed)
Assessment Note  [name] is an 72 y.o. male who presents to [facility] with depression and anxiety.  He voices some SI, but denies plan or intent, but states he feels hopeless and helpless and is experiencing anhedonia and lack of will to live.  He stated, "if anything happens it happens."  This Clinical research associate asked for clarification and he stated, "things happen to people every day."  He reports he is unable to contract for safety at home unless he gets his anxiety under control.  [name] has had some services recently through the [place], but is not under the immediate care of a psychiatrist.  He reports that he takes Paxil for his PTSD and Xanax for anxiety, but they aren't working.  He is supposed to take Xanax once a day PRN, but states that sometimes he takes another one at night to help him sleep and reports that he hasn't slept in several days.  He also states that he has lost 20-25 lbs recently without trying.  HE states that he could care less whether he lives or dies and that this current period of severe depression and suicidal ideation has lasted approximately 9 days.  [name] is also endorsing flashbacks and reports he has a history of PTSD.  He has been in therapy for this but the last time was a long time ago.  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 He reports that his last binge lasted 4-5 days and ended last Sunday.  He states that he is unsure how much he was drinking during that time, but knows that it was way too much.  He denies any other SA.  He also denies HI or AVH other than his flashbacks, which sometimes feel very real and are difficult to separate from.  [name] felt he could not be safe at home without his anxiety being addressed, he reports near constant panic attacks.  This Clinical research associate consulted [facility] provider on call, [name], who stated that due to the patient offering vague description of his suicidal thoughts, his alcohol abuse, lack of support  system, decompensation, and flashbacks, he would recommend the patient be admitted to a behavioral health facility for crisis stabilization.  The patient is on Oxygen which is exclusionary criteria for [facility].  Will work on disposition to a facility that can accommodate him.  Spoke with EDP [name] who is aware and in agreement with the disposition.    Axis I: Anxiety Disorder NOS, Major Depression, Recurrent severe, Post Traumatic Stress Disorder and Alcohol Abuse Axis II: Deferred Axis III:  Past Medical History  Diagnosis Date  . Essential hypertension, benign   . Alcohol abuse   . PTSD (post-traumatic stress disorder)   . Chronic back pain   . Depression   . Peripheral neuropathy   . DJD (degenerative joint disease)   . COPD (chronic obstructive pulmonary disease)   . Anemia   . Myocardial infarction   . Type II or unspecified type diabetes mellitus without mention of complication, not stated as uncontrolled   . Esophageal dysmotility 07/04/2011  . Chronic respiratory failure with hypoxia   . Anxiety   . Paroxysmal atrial fibrillation 08/29/2012  . Partial small bowel obstruction 09/02/2012  . Bilateral lower extremity edema 09/03/2012    Possibly secondary to calcium channel blocker.   Axis IV: problems with access to health care services and problems with primary support group Axis V: 41-50 serious symptoms  Past Medical  History:  Past Medical History  Diagnosis Date  . Essential hypertension, benign   . Alcohol abuse   . PTSD (post-traumatic stress disorder)   . Chronic back pain   . Depression   . Peripheral neuropathy   . DJD (degenerative joint disease)   . COPD (chronic obstructive pulmonary disease)   . Anemia   . Myocardial infarction   . Type II or unspecified type diabetes mellitus without mention of complication, not stated as uncontrolled   . Esophageal dysmotility 07/04/2011  . Chronic respiratory failure with hypoxia   . Anxiety   . Paroxysmal atrial  fibrillation 08/29/2012  . Partial small bowel obstruction 09/02/2012  . Bilateral lower extremity edema 09/03/2012    Possibly secondary to calcium channel blocker.    Past Surgical History  Procedure Laterality Date  . Hernia repair      X 3  . Spinal surgeries      X 4  . Appendectomy    . Prostate surgery      Family History:  Family History  Problem Relation Age of Onset  . Lung disease Mother   . Prostate cancer Father     Social History:  reports that he has been smoking Cigarettes.  He has been smoking about 0.50 packs per day. He uses smokeless tobacco. He reports that  drinks alcohol. He reports that he does not use illicit drugs.  Additional Social History:  Alcohol / Drug Use History of alcohol / drug use?: Yes Substance #1 Name of Substance 1: ETOH 1 - Age of First Use: teens 1 - Amount (size/oz): binges 1 - Frequency: daily 1 - Duration: ongoing 1 - Last Use / Amount: Sunday "too much for 4-5 days"  CIWA: CIWA-Ar BP: 137/52 mmHg Pulse Rate: 93 COWS:    Allergies:  Allergies  Allergen Reactions  . Aspirin Other (See Comments)    Stomach upset    Home Medications:  (Not in a hospital admission)  OB/GYN Status:  No LMP for male patient.  General Assessment Data Location of Assessment: [facility] ED Is this a Tele or Face-to-Face Assessment?: Face-to-Face Is this an Initial Assessment or a Re-assessment for this encounter?: Initial Assessment Living Arrangements: Alone Can pt return to current living arrangement?: Yes Admission Status: Voluntary Is patient capable of signing voluntary admission?: Yes Transfer from: Acute Hospital Referral Source: Self/Family/Friend     [facility] Crisis Care Plan Living Arrangements: Alone  Education Status Is patient currently in school?: No  Risk to self Suicidal Ideation: Yes-Currently Present Suicidal Intent: No Is patient at risk for suicide?: Yes Suicidal Plan?: No-Not Currently/Within Last 6 Months (vague-"if  anything happens it happens") Access to Means: Yes Specify Access to Suicidal Means: Rx medications What has been your use of drugs/alcohol within the last 12 months?: binge drinking Previous Attempts/Gestures: No Triggers for Past Attempts: None known Intentional Self Injurious Behavior: None Family Suicide History: Unknown Recent stressful life event(s): Recent negative physical changes Persecutory voices/beliefs?: No Depression: Yes Depression Symptoms: Despondent;Insomnia;Tearfulness;Isolating;Fatigue;Guilt;Loss of interest in usual pleasures;Feeling worthless/self pity;Feeling angry/irritable Substance abuse history and/or treatment for substance abuse?: Yes Suicide prevention information given to non-admitted patients: Not applicable  Risk to Others Homicidal Ideation: No Thoughts of Harm to Others: No Current Homicidal Intent: No Current Homicidal Plan: No Access to Homicidal Means: No History of harm to others?: No Assessment of Violence: In past 6-12 months Violent Behavior Description: fighting Does patient have access to weapons?: No Criminal Charges Pending?: No Does patient have a court  date: No  Psychosis Hallucinations: None noted Delusions: None noted  Mental Status Report Appear/Hygiene: Disheveled;Poor hygiene Eye Contact: Good Motor Activity: Freedom of movement Speech: Logical/coherent Level of Consciousness: Alert Mood: Depressed;Anxious Affect: Angry;Depressed;Irritable;Sad Anxiety Level: Panic Attacks Panic attack frequency: constant Most recent panic attack: today Thought Processes: Relevant;Coherent Judgement: Impaired Orientation: Person;Place;Situation;Time Obsessive Compulsive Thoughts/Behaviors: Severe  Cognitive Functioning Concentration: Decreased Memory: Recent Impaired;Remote Impaired IQ: Average Insight: Fair Impulse Control: Poor Appetite: Poor Weight Loss: 20 Sleep: Decreased Total Hours of Sleep: 0 (several days without  sleep) Vegetative Symptoms: Staying in bed  ADLScreening [facility] Assessment Services) Patient's cognitive ability adequate to safely complete daily activities?: Yes Patient able to express need for assistance with ADLs?: Yes Independently performs ADLs?: Yes (appropriate for developmental age)  Prior Inpatient Therapy Prior Inpatient Therapy: Yes Prior Therapy Dates: unknown Prior Therapy Facilty/Provider(s): [facility] Reason for Treatment: suicidal depression  Prior Outpatient Therapy Prior Outpatient Therapy: Yes Prior Therapy Dates: current Prior Therapy Facilty/Provider(s): Services through [place] Reason for Treatment: medications  ADL Screening (condition at time of admission) Patient's cognitive ability adequate to safely complete daily activities?: Yes Patient able to express need for assistance with ADLs?: Yes Independently performs ADLs?: Yes (appropriate for developmental age)       Abuse/Neglect Assessment (Assessment to be complete while patient is alone) Physical Abuse: Denies Verbal Abuse: Denies Sexual Abuse: Denies Exploitation of patient/patient's resources: Denies Values / Beliefs Cultural Requests During Hospitalization: None Spiritual Requests During Hospitalization: None   Advance Directives (For Healthcare) Advance Directive: Patient does not have advance directive;Patient would not like information    Additional Information 1:1 In Past 12 Months?: No CIRT Risk: No Elopement Risk: No Does patient have medical clearance?: Yes     Disposition:  Disposition Initial Assessment Completed for this Encounter: Yes Disposition of Patient: Inpatient treatment program Type of inpatient treatment program: Adult  On Site Evaluation by:   Reviewed with Physician:    Steward Ros 03/17/2013 6:44 PM

## 2013-03-17 NOTE — ED Notes (Signed)
Pt comes via EMS with c/o increased SOB as well as depression and anxiety. Pt reports centralized chest pain which he states "is always there". Pt states he's become more depressed recently and hasn't been using his albuterol  Inhaler as much because he "didn't feel like it". Pt also states "I'm to the point where I don't care". When asked if pt has suicidal ideations pt states "I just don't care". Pt denies HI.

## 2013-03-17 NOTE — ED Notes (Signed)
Called BH, as requested by EDP to see if we had an appt time yet for our consultation. I was informed that," right now they are in shift change, but he is first on the list and they will call as soon as appt time is set".  Nurse informed.

## 2013-03-18 ENCOUNTER — Encounter (HOSPITAL_COMMUNITY): Payer: Self-pay

## 2013-03-18 MED ORDER — METFORMIN HCL 500 MG PO TABS
1000.0000 mg | ORAL_TABLET | Freq: Two times a day (BID) | ORAL | Status: DC
  Administered 2013-03-18 – 2013-03-22 (×7): 1000 mg via ORAL
  Filled 2013-03-18 (×3): qty 2

## 2013-03-18 NOTE — BH Assessment (Signed)
[facility] Assessment Progress Note   This clinician called [facility] and [facility] to find that they all had no beds available at this time.  All three however are expecting discharges today (08/15) so referral for this patient was made to all three.  On-coming clinician or MHT will follow up.

## 2013-03-18 NOTE — BH Assessment (Signed)
[facility] Assessment Progress Note   Update:  Called [facility] to check on pt referral and per [facility] @ 1009, pt still under review.  Called [facility] and per [place] @ 1014, no beds.  Called [facility] and no beds per [place] John, but he stated to send referral for possible wait list @ 1016.  Referral faxed for review.  Called [facility] and no gero beds per [place] @ 1017.  Called [facility] and left message @ 1018.  Called [facility], spoke with [facility] @ [phone] and she stated pt still under review.

## 2013-03-18 NOTE — ED Notes (Signed)
Resting no distress

## 2013-03-18 NOTE — ED Notes (Signed)
Patient resting on left side. No distress.

## 2013-03-18 NOTE — ED Notes (Signed)
Pt is sleeping at this time. [name]

## 2013-03-18 NOTE — ED Notes (Signed)
Pt requesting a remote for the TV. Pt informed it is against hospital policy to have it in the room. Pt states he had the day before. Pt informed that I was sorry but per hospital policy it could not be in room.

## 2013-03-18 NOTE — ED Notes (Signed)
Pt ambulated to restroom w/ sitter by side & returned to room w/ no complications.

## 2013-03-18 NOTE — ED Notes (Signed)
Resting  No distress

## 2013-03-19 MED ORDER — NICOTINE 21 MG/24HR TD PT24: 21.0000 mg | MEDICATED_PATCH | Freq: Once | TRANSDERMAL | Status: DC

## 2013-03-19 MED ORDER — OXYCODONE-ACETAMINOPHEN 5-325 MG PO TABS
ORAL_TABLET | ORAL | Status: AC
  Administered 2013-03-20: 1 via ORAL
  Filled 2013-03-19: qty 1

## 2013-03-19 MED ORDER — METFORMIN HCL 500 MG PO TABS
ORAL_TABLET | ORAL | Status: AC
  Administered 2013-03-20: 1000 mg via ORAL
  Filled 2013-03-19: qty 2

## 2013-03-19 MED ORDER — ALPRAZOLAM 0.5 MG PO TABS
ORAL_TABLET | ORAL | Status: AC
  Administered 2013-03-20: 0.5 mg via ORAL
  Filled 2013-03-19: qty 1

## 2013-03-19 MED ORDER — NICOTINE 21 MG/24HR TD PT24
21.0000 mg | MEDICATED_PATCH | Freq: Every day | TRANSDERMAL | Status: DC
  Administered 2013-03-19 – 2013-03-23 (×4): 21 mg via TRANSDERMAL
  Filled 2013-03-19 (×3): qty 1

## 2013-03-19 NOTE — Progress Notes (Signed)
[name], MHT provided follow up with previous referrals that were faxed for review. Writer spoke with (Melonie, RN) at [facility] who reports that referral has not been submitted to MD for review and that they are down to one bed, [facility] no response left voice message requesting status of referral, [facility] reports per (Early, RN) that referrals have not been reviewed for today with expectancy to be completed later on today and not to count them out. Placement search to be continued.

## 2013-03-19 NOTE — ED Notes (Signed)
Pt resting calmly w/ eyes closed. Rise & fall of the chest noted. Sitter at bedside. Bed in low position, side rails up x2. NAD noted at this time.  

## 2013-03-19 NOTE — ED Notes (Signed)
Pt sleeping chest rise and fall

## 2013-03-19 NOTE — ED Notes (Signed)
Pt given belongings to remove money for snack from vending machine. Pt removed 4 $1.00 bills from wallet. RN and sitter present.

## 2013-03-19 NOTE — ED Notes (Signed)
Pt is asleep at this time sitter is at bedside. [name]

## 2013-03-19 NOTE — BH Assessment (Addendum)
[facility] Assessment Progress Note   Update:  Pt declined [facility] per [place] @ 1453 due to alcohol abuse.  Update:  Called [facility] in reference to pt referral.  No answer @ 1408.  Referral refaxed for review.  Called [facility] and per [place] @ 1410, no beds.  Called [facility] nd per [facility] @ 1411, no beds.  Called [facility] and per [facility] @ 1425, beds available.  Referral faxed for review.  Called [facility] and beds available per [facility] @ [phone].  Referral faxed for review.  Called [facility] and no beds per [employer] @ 1413.  This clinician will continue to follow up on referrals to find placement for the pt.

## 2013-03-20 MED ORDER — PANTOPRAZOLE SODIUM 40 MG PO TBEC
DELAYED_RELEASE_TABLET | ORAL | Status: AC
  Administered 2013-03-20: 40 mg via ORAL
  Filled 2013-03-20: qty 1

## 2013-03-20 MED ORDER — OXYCODONE-ACETAMINOPHEN 5-325 MG PO TABS
ORAL_TABLET | ORAL | Status: AC
  Administered 2013-03-20: 1 via ORAL
  Filled 2013-03-20: qty 1

## 2013-03-20 MED ORDER — METFORMIN HCL 500 MG PO TABS
ORAL_TABLET | ORAL | Status: AC
  Filled 2013-03-20: qty 2

## 2013-03-20 MED ORDER — ALPRAZOLAM 0.5 MG PO TABS
ORAL_TABLET | ORAL | Status: AC
  Administered 2013-03-20: 0.5 mg via ORAL
  Filled 2013-03-20: qty 1

## 2013-03-20 MED ORDER — ALPRAZOLAM 0.5 MG PO TABS
ORAL_TABLET | ORAL | Status: AC
  Filled 2013-03-20: qty 1

## 2013-03-20 MED ORDER — OXYCODONE-ACETAMINOPHEN 5-325 MG PO TABS
ORAL_TABLET | ORAL | Status: AC
  Administered 2013-03-20: 1
  Filled 2013-03-20: qty 1

## 2013-03-20 MED ORDER — METFORMIN HCL 500 MG PO TABS
ORAL_TABLET | ORAL | Status: AC
  Administered 2013-03-20: 1000 mg via ORAL
  Filled 2013-03-20: qty 2

## 2013-03-20 NOTE — ED Notes (Addendum)
Telepsych consult in progress

## 2013-03-20 NOTE — BH Assessment (Addendum)
Contacted the following facilities for psychiatric placement:  [facility]: per Wonda Amis, at capacity [facility]: per [place], possible bed available. Faxed clinical information. [facility]: per Delorise Shiner, declined due to alcohol abuse.  [facility]: per [place], at capacity.  [facility]: per [place], Pt is still under review. [facility]: per Oda Kilts, at capacity  [facility]: at [facility]: per Mikey Bussing, at [facility]: per Selena Batten, at [facility]: per Olegario Messier, at capacity  [facility] Patsy [place], [facility], [facility] Triage Specialist

## 2013-03-20 NOTE — ED Notes (Signed)
Pt awake, asking for cup of coffee. Pt sat in chair and drank decaf coffee.  We moved his stretcher out and and a bed in room.

## 2013-03-20 NOTE — BH Assessment (Addendum)
Assessment Note  [name] is an 72 y.o. male. This is reassessment of pt awaiting inpatient psychiatric bed.  Pt reports that he continues to "feel nervous" and that being around all of the people at [facility] makes his PTSD symptoms worse.  Pt then said he needed to clarify something about suicide:  He said the previous clinician had written that he was suicidal and that was not correct.  He said that "I'm not planning no suicide, but at times, I couldn't care less about what happens."  Pt reports that medicine for anxiety given to him at [facility] has helped and he is feeling better.  He is vague about SI as mentioned above.  He also today endorsed that he has had thoughts of harming others in the past 6 months, but they are again, vague with no plan.  Pt also reports that he has had auditory hallucinations in the past but most recently about6 months ago.  Pt described his alcohol use as 4-5 day binges.  He said he has had 2 binges like this in the past 6 months.  Pt also at times takes 2 extra xanax per day.  Clinician told pt that we are still looking for a treatment/psych bed for him and he indicated that he is OK with this.    Axis I: Alcohol Abuse, Anxiety Disorder NOS, Major Depression, Recurrent severe and Post Traumatic Stress Disorder Axis II: Deferred Axis III:  Past Medical History  Diagnosis Date  . Essential hypertension, benign   . Alcohol abuse   . PTSD (post-traumatic stress disorder)   . Chronic back pain   . Depression   . Peripheral neuropathy   . DJD (degenerative joint disease)   . COPD (chronic obstructive pulmonary disease)   . Anemia   . Myocardial infarction   . Type II or unspecified type diabetes mellitus without mention of complication, not stated as uncontrolled   . Esophageal dysmotility 07/04/2011  . Chronic respiratory failure with hypoxia   . Anxiety   . Paroxysmal atrial fibrillation 08/29/2012  . Partial small bowel obstruction 09/02/2012  . Bilateral lower  extremity edema 09/03/2012    Possibly secondary to calcium channel blocker.   Axis IV: medical issues Axis V: 31-40 impairment in reality testing  Past Medical History:  Past Medical History  Diagnosis Date  . Essential hypertension, benign   . Alcohol abuse   . PTSD (post-traumatic stress disorder)   . Chronic back pain   . Depression   . Peripheral neuropathy   . DJD (degenerative joint disease)   . COPD (chronic obstructive pulmonary disease)   . Anemia   . Myocardial infarction   . Type II or unspecified type diabetes mellitus without mention of complication, not stated as uncontrolled   . Esophageal dysmotility 07/04/2011  . Chronic respiratory failure with hypoxia   . Anxiety   . Paroxysmal atrial fibrillation 08/29/2012  . Partial small bowel obstruction 09/02/2012  . Bilateral lower extremity edema 09/03/2012    Possibly secondary to calcium channel blocker.    Past Surgical History  Procedure Laterality Date  . Hernia repair      X 3  . Spinal surgeries      X 4  . Appendectomy    . Prostate surgery      Family History:  Family History  Problem Relation Age of Onset  . Lung disease Mother   . Prostate cancer Father     Social History:  reports that he  has been smoking Cigarettes.  He has been smoking about 0.50 packs per day. He uses smokeless tobacco. He reports that  drinks alcohol. He reports that he does not use illicit drugs.  Additional Social History:  Alcohol / Drug Use Pain Medications: Pt denies Prescriptions: Pt denies Over the Counter: Pt denies History of alcohol / drug use?: Yes Negative Consequences of Use: Financial;Legal;Personal relationships;Work / Mining engineer #1 Name of Substance 1: alcohol 1 - Age of First Use: teens 1 - Amount (size/oz): at least 12 beers per day 1 - Frequency: 2 4-5 day binges in the past 6 months 1 - Duration: ongoing 1 - Last Use / Amount: 2 weeks ago: 5 day alcohol binge Substance #2 Name of Substance  2: xanax--pt is prescribed 1 xanax per day, will sometimes take extra 2 - Amount (size/oz): 2-3 pills  2 - Frequency: daily 2 - Last Use / Amount: 2 weeks ago  CIWA: CIWA-Ar BP: 142/56 mmHg Pulse Rate: 87 COWS:    Allergies:  Allergies  Allergen Reactions  . Aspirin Other (See Comments)    Stomach upset    Home Medications:  (Not in a hospital admission)  OB/GYN Status:  No LMP for male patient.  General Assessment Data [facility] of Assessment: [facility] ACT Assessment: Yes Is this a Tele or Face-to-Face Assessment?: Tele Assessment Is this an Initial Assessment or a Re-assessment for this encounter?: Re-Assessment Living Arrangements: Alone Can pt return to current living arrangement?: Yes Admission Status: Voluntary Is patient capable of signing voluntary admission?: Yes Transfer from: Acute Hospital Referral Source: Self/Family/Friend     [facility] Crisis Care Plan Living Arrangements: Alone  Education Status Is patient currently in school?: No  Risk to self Suicidal Ideation: No-Not Currently/Within Last 6 Months Suicidal Intent: No Is patient at risk for suicide?: Yes Suicidal Plan?: No-Not Currently/Within Last 6 Months Access to Means: Yes Specify Access to Suicidal Means: own pills What has been your use of drugs/alcohol within the last 12 months?: binge use Previous Attempts/Gestures: Yes How many times?: 1 Triggers for Past Attempts: Other (Comment) (alcohol) Intentional Self Injurious Behavior: None Family Suicide History: No Recent stressful life event(s): Other (Comment) (health problems and anxiety) Persecutory voices/beliefs?: No Depression: Yes Depression Symptoms: Despondent;Insomnia;Tearfulness;Isolating;Fatigue;Loss of interest in usual pleasures;Feeling worthless/self pity;Feeling angry/irritable Substance abuse history and/or treatment for substance abuse?: Yes Suicide prevention information given to non-admitted patients: Not  applicable  Risk to Others Homicidal Ideation: No-Not Currently/Within Last 6 Months (vague: has had thoughts of harm to "different people") Thoughts of Harm to Others: No-Not Currently Present/Within Last 6 Months Current Homicidal Intent: No Current Homicidal Plan: No Access to Homicidal Means: No History of harm to others?: No Assessment of Violence: In distant past Violent Behavior Description: fights in past Does patient have access to weapons?: No Criminal Charges Pending?: No Does patient have a court date: No  Psychosis Hallucinations: None noted (pt does report a history of hearing voices-last 4 months ago) Delusions: None noted  Mental Status Report Appear/Hygiene: Disheveled Eye Contact: Good Motor Activity: Unremarkable Speech: Logical/coherent Level of Consciousness: Alert Mood: Other (Comment) (pleasant) Affect: Appropriate to circumstance Anxiety Level: Minimal Panic attack frequency: constant Most recent panic attack: today Thought Processes: Coherent;Relevant Judgement: Unimpaired Orientation: Person;Place;Time;Situation Obsessive Compulsive Thoughts/Behaviors: Moderate  Cognitive Functioning Concentration: Decreased Memory: Recent Intact;Remote Intact IQ: Average Insight: Good Impulse Control: Fair Appetite: Poor Weight Loss: 25 Weight Gain: 0 Sleep: No Change (sleeps well with medicine) Total Hours of Sleep: 8 Vegetative Symptoms:  Staying in bed  ADLScreening [facility] Assessment Services) Patient's cognitive ability adequate to safely complete daily activities?: Yes Patient able to express need for assistance with ADLs?: Yes Independently performs ADLs?: Yes (appropriate for developmental age)  Prior Inpatient Therapy Prior Inpatient Therapy: Yes Prior Therapy Dates: 72. [facility] Reason for Treatment: alcohol  Prior Outpatient Therapy Prior Outpatient Therapy: Yes (2009 [facility]: psych, 2008 [place] detox) Prior  Therapy Dates: current Prior Therapy Facilty/Provider(s): Services through [place] Reason for Treatment: medications  ADL Screening (condition at time of admission) Patient's cognitive ability adequate to safely complete daily activities?: Yes Patient able to express need for assistance with ADLs?: Yes Independently performs ADLs?: Yes (appropriate for developmental age)       Abuse/Neglect Assessment (Assessment to be complete while patient is alone) Physical Abuse: Denies Verbal Abuse: Denies Sexual Abuse: Denies Exploitation of patient/patient's resources: Denies Values / Beliefs Cultural Requests During Hospitalization: None Spiritual Requests During Hospitalization: None   Advance Directives (For Healthcare) Advance Directive: Patient does not have advance directive;Patient would not like information Nutrition Screen- [facility] Adult/[facility] Patient's home diet: Regular  Additional Information 1:1 In Past 12 Months?: No CIRT Risk: No Elopement Risk: No Does patient have medical clearance?: Yes     Disposition: Discussed this pt with Dr Oletta Lamas at [facility].  Pt was saying he wanted to leave earlier, but seems to have calmed down now.  We discussed IVC if pt continues to try to leave, which this clinician told DR Oletta Lamas would be appropriate.  [facility] will continue to search for pt.  Dr Oletta Lamas asked that we explore [facility] option. Disposition Initial Assessment Completed for this Encounter: Yes Disposition of Patient: Inpatient treatment program Type of inpatient treatment program: Adult  On Site Evaluation by:   Reviewed with Physician:    Lorri Frederick 03/20/2013 6:56 PM

## 2013-03-20 NOTE — ED Notes (Signed)
Dr. Oletta Lamas made aware of patient condition.

## 2013-03-20 NOTE — ED Notes (Signed)
Telepsych completed.  

## 2013-03-20 NOTE — BH Assessment (Signed)
[facility]   03/20/13. 1915.  [facility] [place] is at capacity but referral information can be sent.  David at [facility] reports they are also at capacity but will have discharges tomorrow--they do not have referral form and we need to call back tomorrow AM. Daleen Squibb, LCSW

## 2013-03-20 NOTE — BH Assessment (Signed)
Contacted the following facilities to request transfer for inpatient psychiatric treatment:   [facility]: per Harriett Sine, beds available. Faxed clinical information to [phone].   [facility]: Multiple calls with no response  [facility]: per [place], at capacity  [facility]: at capacity  [facility]: at capacity  [facility]: per French Ana, at [employer]: per Jasmine December, at capacity  [facility]: at [employer]: at [employer]: per [place], at [employer]: at capacity    [employer] Patsy [place], [facility], [facility]  Triage Specialist

## 2013-03-20 NOTE — ED Notes (Signed)
Patient walked to restroom by sitter.

## 2013-03-20 NOTE — ED Provider Notes (Addendum)
Filed Vitals:   03/20/13 0829  BP: 129/53  Pulse: 82  Temp: 98.3 F (36.8 C)  Resp: 16    Pt is in no distress, ambulating with walker in the ED.  No issues recently per RN.  Awaiting bed availability according to [facility] notes.  Pt was declined at [facility] due to h/o alcohol abuse, yesterday.  Pt has VA benefits, RN to ask [facility] if they have inquired VA.    Gavin Pound. Pierre Cumpton, MD 03/20/13 1627   6:00 PM Pt expressed desire to RN that he wished to go home.  Pt seems somewhat disoriented.  I have consulted telepyschiatry to assess pt for safety in discharge home given severe depression.    Gavin Pound. Kori Goins, MD 03/20/13 1823    7:12 PM Telepsych done.  Pt now reports he does want to stay for admission.  In the opinion of TTS, pt should be made involuntary if he tries to leave given his hopeless attitude, alcohol binging and other high risks for Suicide.    Gavin Pound. Oletta Lamas, MD 03/20/13 7829

## 2013-03-20 NOTE — ED Notes (Addendum)
Patient has been getting agitated over the past 2 hours, now stating he wants to sign himself out.  He is confused at times, stating he hasn't eaten all day, when he has eaten two meals and snacks.  States he wants to go home.

## 2013-03-20 NOTE — ED Notes (Signed)
Patient given frozen dinner.

## 2013-03-20 NOTE — BH Assessment (Addendum)
Contacted the following facilities for psychiatric placement  [facility]: per Inetta Fermo, at capacity. [facility]: per Jake Shark, at capacity. [facility]: Per Herbert Seta, at capacity until discharge. [facility]: Per Marylu Lund, at capacity until discharge-estimated tonight or tomorrow. [facility]: Per [place], at capacity until discharge, estimated 1100 today. [facility]: Per Sasha, Possible bed available. Faxed clinical information. [facility]: Per Marchelle Folks, at capacity. [facility]: Per Marcelino Duster, at capacity.

## 2013-03-21 MED ORDER — ALPRAZOLAM 0.5 MG PO TABS
ORAL_TABLET | ORAL | Status: AC
  Administered 2013-03-21: 0.5 mg via ORAL
  Filled 2013-03-21: qty 1

## 2013-03-21 MED ORDER — OXYCODONE-ACETAMINOPHEN 5-325 MG PO TABS
ORAL_TABLET | ORAL | Status: AC
  Administered 2013-03-21: 1 via ORAL
  Filled 2013-03-21: qty 1

## 2013-03-21 MED ORDER — OXYCODONE-ACETAMINOPHEN 5-325 MG PO TABS
ORAL_TABLET | ORAL | Status: AC
  Filled 2013-03-21: qty 1

## 2013-03-21 MED ORDER — METFORMIN HCL 500 MG PO TABS
ORAL_TABLET | ORAL | Status: AC
  Administered 2013-03-21: 1000 mg via ORAL
  Filled 2013-03-21: qty 2

## 2013-03-21 MED ORDER — METFORMIN HCL 500 MG PO TABS
ORAL_TABLET | ORAL | Status: AC
  Filled 2013-03-21: qty 2

## 2013-03-21 MED ORDER — PANTOPRAZOLE SODIUM 40 MG PO TBEC
DELAYED_RELEASE_TABLET | ORAL | Status: AC
  Filled 2013-03-21: qty 1

## 2013-03-21 MED ORDER — ALPRAZOLAM 0.5 MG PO TABS
ORAL_TABLET | ORAL | Status: AC
  Filled 2013-03-21: qty 1

## 2013-03-21 NOTE — [facility] Counselor (Signed)
Writer contacted the nurse working with the patient [name]) at [phone].  Writer informed the nurse that the patient is pending placement at [facility], [facility] and [facility] hospital.    The patients referral information has also been faxed to the following hospital by the third shift therapist Venda Rodes:   [facility]: Multiple calls with no response  [facility]: per Starr School, at capacity  [facility]: at [employer] [facility]: at capacity  [facility]: per French Ana, at [employer]: per Jasmine December, at capacity  [facility]: at [employer]: at [employer]: per [place], at [employer]: at capacity

## 2013-03-21 NOTE — BH Assessment (Signed)
Same @ [name] called back stating they are reviewing patient's referral and wanted to know if a bed is still needed.

## 2013-03-21 NOTE — ED Notes (Signed)
Meal tray provided.

## 2013-03-21 NOTE — BH Assessment (Signed)
Providers are currently in treatment team, therefore; bed availability is unk at this time. Meanwhile, writer is seeking alternative placement. Patient referred to [facility], [facility], [facility], [facility], [facility], [facility], [facility], and [facility].  

## 2013-03-21 NOTE — [facility] Counselor (Signed)
Writer left a voice mail message with [facility] Department [phone]) regarding the patients referral for placement.

## 2013-03-21 NOTE — Progress Notes (Signed)
VM message from Venetia Night, RN. Pt spoke to her,  night nurse,  requesting new clothes. Wants clean underwear and socks. The ones he came in with are very soiled and smell like urine. He would like to talk to CM about someone going to a store and buying some new things for him. Spoke with patient and he states he has money with him in his clothes, and would really like to have some new things, but does not want to contact his family. Spoke with patient's nurse, and CM supervisor, S. Marcelle Overlie, RN/CM.  Security came to lock patient's money in the safe, and CM was given $60. CM went to nearby store and bought clothes supplies that  Pt requested. Receipt and change were given to pt and security returned to place the remaining money in the safe. Pt was very pleased with the purchases, and very thankful to CM for doing this.

## 2013-03-21 NOTE — ED Notes (Signed)
Pt taken to shower by security.

## 2013-03-21 NOTE — ED Notes (Signed)
Anibal Henderson Case Manager for ER at bedside

## 2013-03-22 MED ORDER — OXYCODONE-ACETAMINOPHEN 5-325 MG PO TABS
ORAL_TABLET | ORAL | Status: AC
  Filled 2013-03-22: qty 1

## 2013-03-22 MED ORDER — METFORMIN HCL 500 MG PO TABS
ORAL_TABLET | ORAL | Status: AC
  Filled 2013-03-22: qty 2

## 2013-03-22 MED ORDER — ALBUTEROL SULFATE HFA 108 (90 BASE) MCG/ACT IN AERS
INHALATION_SPRAY | RESPIRATORY_TRACT | Status: AC
  Filled 2013-03-22: qty 6.7

## 2013-03-22 MED ORDER — ALPRAZOLAM 0.5 MG PO TABS
ORAL_TABLET | ORAL | Status: AC
  Filled 2013-03-22: qty 1

## 2013-03-22 MED ORDER — PANTOPRAZOLE SODIUM 40 MG PO TBEC
DELAYED_RELEASE_TABLET | ORAL | Status: AC
  Filled 2013-03-22: qty 1

## 2013-03-22 NOTE — ED Notes (Signed)
Pt asking to speak to a case worker. Pt informed none here at this time of night & that he would need to ask day shift nurse to have one see him. When asked what he needed to see one about replied "it needs to be in private & about what is outside." no further explanation given.

## 2013-03-22 NOTE — ED Notes (Signed)
Pt states he wants to get dressed and go down to the cantine, informed the patient that he would not be able to do that at this moment, the pt becomes verbally aggressive with staff.

## 2013-03-22 NOTE — Progress Notes (Signed)
Wynetta Emery, [facility] provided follow with previous referrals submitted in efforts to secure placment.  [facility] spoke [facility] referral faxed for review [facility] spoke with [facility] who reports at capacity [facility] spoke with Liborio Nixon reports to call after 8am [facility] has referrals that are under review and has not reviewed yet [facility] no answer [facility] spoke with Zella Ball at capacity [place] spoke with Okey Dupre reports receiving only patients up to 88 yr.o [facility] spoke with Rosey Bath who reports denial by Dr. Hardie Pulley [facility] no answer [facility] spoke with Rene Kocher who reports at capacity

## 2013-03-22 NOTE — BH Assessment (Signed)
TC to [facility] - pt hasn't been reviewed yet. TC to [place] at [place] - pt hasn't been reviewed yet.    Evette Cristal, [place] Assessment Counselor

## 2013-03-22 NOTE — ED Notes (Addendum)
Pt states that he is has bilateral knee pain all the time and wondered what was wrong with his knees.

## 2013-03-22 NOTE — ED Notes (Signed)
Pt resting calmly w/ eyes closed. Rise & fall of the chest noted. Sitter at bedside. Bed in low position, side rails up x2. NAD noted at this time.  

## 2013-03-23 ENCOUNTER — Encounter (HOSPITAL_COMMUNITY): Payer: Self-pay | Admitting: Psychiatry

## 2013-03-23 DIAGNOSIS — F411 Generalized anxiety disorder: Secondary | ICD-10-CM

## 2013-03-23 MED ORDER — PANTOPRAZOLE SODIUM 40 MG PO TBEC
DELAYED_RELEASE_TABLET | ORAL | Status: AC
  Administered 2013-03-23: 40 mg via ORAL
  Filled 2013-03-23: qty 1

## 2013-03-23 MED ORDER — NICOTINE 21 MG/24HR TD PT24
MEDICATED_PATCH | TRANSDERMAL | Status: AC
  Filled 2013-03-23: qty 1

## 2013-03-23 MED ORDER — ALPRAZOLAM 0.5 MG PO TABS
ORAL_TABLET | ORAL | Status: AC
  Filled 2013-03-23: qty 1

## 2013-03-23 MED ORDER — METFORMIN HCL 500 MG PO TABS
ORAL_TABLET | ORAL | Status: AC
  Administered 2013-03-23: 1000 mg via ORAL
  Filled 2013-03-23: qty 2

## 2013-03-23 MED ORDER — OXYCODONE-ACETAMINOPHEN 5-325 MG PO TABS
ORAL_TABLET | ORAL | Status: AC
  Administered 2013-03-23: 1 via ORAL
  Filled 2013-03-23: qty 1

## 2013-03-23 NOTE — ED Notes (Signed)
Patient discharged at this time via Wheelchair. Left ED in good condition.

## 2013-03-23 NOTE — ED Notes (Signed)
Telepsych to be done at 1240 per [facility] at [facility].

## 2013-03-23 NOTE — ED Notes (Signed)
Telepsych in progress. 

## 2013-03-23 NOTE — ED Provider Notes (Addendum)
Patient still awaiting placement by behavioral health. Patient resting currently but yesterday was still expressing concerns for suicidal thoughts. Patient was seen by me yesterday as well. Patient yesterday had the complaint of some bilateral leg aching that has been chronic. No recent injury. Will followup with behavioral health for update on status for placement.  Shelda Jakes, MD 03/23/13 0930  Patient reinterviewed by behavioral health today. Patient no longer requiring inpatient treatment. Patient can be discharged home. Had the care manager evaluate the patient and were going to arrange for some home health visits. Patient cleared for discharge. Patient's primary care Dr. is Dr. Lysbeth Galas make an appointment to followup with him as well.  Shelda Jakes, MD 03/23/13 [phone]

## 2013-03-23 NOTE — ED Notes (Signed)
Pt states he wants to go home but he needs some help. States he needs someone to go to the store for him because he cannot use the scooters. 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

## 2013-03-23 NOTE — ED Notes (Signed)
Belongings returned to patient. Cab called for ride home with voucher. Cab company reports 1.5 hour wait due to availability. Patient informed.

## 2013-03-23 NOTE — Progress Notes (Signed)
CARE MANAGEMENT ED NOTE 03/23/2013  Patient:  [name], [name]   Account Number:  [id number]  Date Initiated:  03/21/2013  Documentation initiated by:  Anibal Henderson  Subjective/Objective Assessment:     Subjective/Objective Assessment Detail:   VM message from Venetia Night, RN. Pt spoke to her,  night nurse,  requesting new clothes. Wants clean underwear and socks. The ones he came in with are very soiled and smell like urine. He would like to talk to CM about someone going to a store and buying some new things for him. Spoke with patient and he states he has money with him in his clothes, and would really like to have some new things, but does not want to contact his family. Spoke with patient's nurse, and CM supervisor, S. Marcelle Overlie, RN/CM.  Security came to lock patient's money in the safe, and CM was given $60. CM went to nearby store and bought clothes supplies that  Pt requested. Receipt and change were given to pt and security returned to place the remaining money in the safe. Pt was very pleased with the purchases, and very thankful to CM for doing this.     Action/Plan:   Action/Plan Detail:   Anticipated DC Date:       Status Recommendation to Physician:   Result of Recommendation:      DC Planning Services  CM consult   [facility] Choice  HOME HEALTH   Choice offered to / List presented to:  C-1 Patient     HH arranged  HH-1 RN  HH-6 SOCIAL WORKER      HH agency  [facility]    Status of service:  Completed, signed off  ED Comments:   ED Comments Detail:  03/23/13 1100 Spoke with patient about needs at home. He states he needs assistance with the housework, "a little", but what he needs most is someone to go to the grocery store and run errands, 1-2x a week. He does not drive and has no car. He says he can manage the house a little at a time if someone could just do the shopping for him.He decides he may also have them do a little house work, if he can afford it. He  lives in [place]. List printed for the private duty aide services, and those agencies marked that visit [facility]. Two agencies called to get rough estimate of prices, $16.50-$25/hr, and this all given to patient with explanation to call any of the agencies that come to [employer]. Suggest he try several to check prices, and availability for [place]. Pt is a veteran, so was given the handout with the veteran's affairs number, to apply for A&A to help cover the cost of the aide.  1500 HH set up to follow pt at home and assist him  with setting up community assistance, and attempting to get coverage through the [place].

## 2013-03-23 NOTE — Consult Note (Signed)
Reason for Consult:  Suicidal ideations Referring Physician: Tele-psych  [name] is an 72 y.o. male.  HPI: Patient denies depression, suicidal/homicidal ideations, and hallucinations.  He reports sleep as good and appetite as fair.  [name] regarding his suicidal remark with "I know I didn't say that.  I know Im; starting to lose my mind but I know I didn't say that."  His biggest concern is dividing his property among his family before he dies and wants [place] to assist him with the legalities and notary.  This practitioner explained we were not equipped to handle legal affairs but maybe the social worker in the ED could give him some information on who could help him outside the hospital.   Past Medical History  Diagnosis Date  . Essential hypertension, benign   . Alcohol abuse   . PTSD (post-traumatic stress disorder)   . Chronic back pain   . Depression   . Peripheral neuropathy   . DJD (degenerative joint disease)   . COPD (chronic obstructive pulmonary disease)   . Anemia   . Myocardial infarction   . Type II or unspecified type diabetes mellitus without mention of complication, not stated as uncontrolled   . Esophageal dysmotility 07/04/2011  . Chronic respiratory failure with hypoxia   . Anxiety   . Paroxysmal atrial fibrillation 08/29/2012  . Partial small bowel obstruction 09/02/2012  . Bilateral lower extremity edema 09/03/2012    Possibly secondary to calcium channel blocker.    Past Surgical History  Procedure Laterality Date  . Hernia repair      X 3  . Spinal surgeries      X 4  . Appendectomy    . Prostate surgery      Family History  Problem Relation Age of Onset  . Lung disease Mother   . Prostate cancer Father     Social History:  reports that he has been smoking Cigarettes.  He has been smoking about 0.50 packs per day. He uses smokeless tobacco. He reports that  drinks alcohol. He reports that he does not use illicit drugs.  Allergies:   Allergies  Allergen Reactions  . Aspirin Other (See Comments)    Stomach upset    Medications: I have reviewed the patient's current medications.  Results for orders placed during the hospital encounter of 03/17/13 (from the past 48 hour(s))  GLUCOSE, CAPILLARY     Status: None   Collection Time    03/23/13  7:14 AM      Result Value Range   Glucose-Capillary 91  70 - 99 mg/dL   Comment 1 Documented in Chart     Comment 2 Notify RN      No results found.  Review of Systems  Constitutional: Negative.   HENT: Negative.   Eyes: Negative.   Respiratory: Negative.   Cardiovascular: Negative.   Gastrointestinal: Negative.   Genitourinary: Negative.   Musculoskeletal: Negative.   Skin: Negative.   Neurological: Negative.   Endo/Heme/Allergies: Negative.   Psychiatric/Behavioral: The patient is nervous/anxious.    Blood pressure 126/55, pulse 96, temperature 97.6 F (36.4 C), temperature source Oral, resp. rate 18, SpO2 97.00%. Physical Exam Completed by primary MD, reviewed  Family History:  No family history on file.  Assessment/Plan:   Mental Status Examination/Evaluation: Patient is neatly groomed, good eye contact, anxious mood and affect, cooperative with interview.  He denies depression, any active or passive suicidal thoughts or homicidal thoughts.  His thoughts are organized and answers  questions appropriately.  There is no paranoia or delusions present at this time. He denies any auditory or visual hallucinations. His attention and concentration are fair.  His insight and judgment are fair, impulse control intact.  DIAGNOSIS:  AXIS I  General Anxiety Disorder  AXIS II  Deferred   AXIS III  Multiple medical issues, see above  AXIS IV  other psychosocial or environmental problems, problems with access to health care services and problems with primary support group   AXIS V  61-70 mild symptoms    Assessment/Plan:  Recommend tapering his Xanax by his regular  provider and changing to a non-narcotic anxiety medication--like vistaril.  Patient does not need inpatient psychiatric treatment. Recommend followup treatment at local mental health facility or with his regular provider. Contact Child psychotherapist for outpatient discharge planning.  Nanine Means, PMH-NP 03/23/2013, 4:24 PM

## 2013-03-23 NOTE — Progress Notes (Signed)
Spoke with patient about needs at home. He states he needs assistance with the housework, "a little", but what he needs most is someone to go to the grocery store and run errands, 1-2x a week. He does not drive and has no car. He says he can manage the house a little at a time if someone could just do the shopping for him.He decides he may also have them do a little house work, if he can afford it. He lives in [place]. List printed for the private duty aide services, and those agencies marked that visit [facility]. Two agencies called to get rough estimate of prices, $16.50-$25/hr, and this all given to patient with explanation to call any of the agencies that come to [employer]. Suggest he try several to check prices, and availability for [place]. Pt is a veteran, so was given the handout with the veteran's affairs number, to apply for A&A to help cover the cost of the aide.

## 2013-03-23 NOTE — ED Notes (Signed)
Per [facility], patient to be discharged home. MD aware. Patient expressing concerns about home care. Patient states it would be beneficial for him if someone could come to his house and help him with housework, groceries, and reminding him to take his medication. [place], Case management, notified and will speak with patient about options.

## 2013-03-23 NOTE — ED Notes (Signed)
Pt requesting to see nurse, when went to see pt requesting something additional to snack, pt informed he was given a snack at midnight & that breakfast would be served in the morning. Pt then states that the snack was all he had to eat yesterday, pt informed notes in chart stating he at meals provided. Pt then said just go on he did not need anything.

## 2013-03-24 NOTE — Consult Note (Signed)
Agree with plan 

## 2013-03-30 ENCOUNTER — Institutional Professional Consult (permissible substitution): Payer: Medicare Other | Admitting: Pulmonary Disease

## 2013-04-29 ENCOUNTER — Institutional Professional Consult (permissible substitution): Payer: Medicare Other | Admitting: Pulmonary Disease

## 2013-05-10 ENCOUNTER — Institutional Professional Consult (permissible substitution): Payer: Medicare Other | Admitting: Pulmonary Disease

## 2013-05-10 ENCOUNTER — Ambulatory Visit (INDEPENDENT_AMBULATORY_CARE_PROVIDER_SITE_OTHER): Payer: Medicare Other | Admitting: Internal Medicine

## 2013-05-10 ENCOUNTER — Encounter: Payer: Self-pay | Admitting: Internal Medicine

## 2013-05-10 VITALS — BP 164/80 | HR 107 | Temp 97.7°F | Ht 64.0 in | Wt 155.0 lb

## 2013-05-10 DIAGNOSIS — J441 Chronic obstructive pulmonary disease with (acute) exacerbation: Secondary | ICD-10-CM

## 2013-05-10 DIAGNOSIS — F17200 Nicotine dependence, unspecified, uncomplicated: Secondary | ICD-10-CM

## 2013-05-10 DIAGNOSIS — J9611 Chronic respiratory failure with hypoxia: Secondary | ICD-10-CM

## 2013-05-10 DIAGNOSIS — J9610 Chronic respiratory failure, unspecified whether with hypoxia or hypercapnia: Secondary | ICD-10-CM

## 2013-05-10 DIAGNOSIS — R0902 Hypoxemia: Secondary | ICD-10-CM

## 2013-05-10 DIAGNOSIS — I10 Essential (primary) hypertension: Secondary | ICD-10-CM

## 2013-05-10 NOTE — Progress Notes (Signed)
Subjective:     Patient ID: JEDIAH HORGER, male   DOB: [birth year] MRN: [id number]  HPI  19 yowm active smoker [place] vet with ptsd who lives alone referred by [facility] at [facility] to pulmonary clinic 05/10/2013 for copd/ already on 02 since around 2011.  Last admit to [facility] July 2014 for cp not copd    05/10/2013 1st [facility] office visit/ Reggie Bise cc doe x 50 ft even on 02, extremely limited insight into meds/ care brings large box of pills that include ACEi but not sure what he's really taking. Symptoms indollent in onset and  slowly progressive x 5 years doe with chronic am cough prod thick white mucust but apparently to tendency to aecopd. Says nothing ever helps his breathing, not willing to consider quitting smoking  No obvious day to day or daytime variabilty or assoc or chest tightness, subjective wheeze overt sinus or hb symptoms. No unusual exp hx or h/o childhood pna/ asthma or knowledge of premature birth.  Sleeping ok without nocturnal  or early am exacerbation  of respiratory  c/o's or need for noct saba. Also denies any obvious fluctuation of symptoms with weather or environmental changes or other aggravating or alleviating factors except as outlined above   Current Medications, Allergies, Complete Past Medical History, Past Surgical History, Family History, and Social History were reviewed in [employer] record.  ROS  The following are not active complaints unless bolded sore throat, dysphagia, dental problems, itching, sneezing,  nasal congestion or excess/ purulent secretions, ear ache,   fever, chills, sweats, unintended wt loss, pleuritic or exertional cp, hemoptysis,  orthopnea pnd or leg swelling, presyncope, palpitations, heartburn, abdominal pain, anorexia, nausea, vomiting, diarrhea  or change in bowel or urinary habits, change in stools or urine, dysuria,hematuria,  rash, arthralgias, visual complaints, headache, numbness weakness or ataxia or problems  with walking or coordination,  change in mood/affect or memory.          Review of Systems     Objective:   Physical Exam    chronically ill amb wm nad  Wt Readings from Last 3 Encounters:  05/10/13 155 lb (70.308 kg)  02/01/13 147 lb 11.3 oz (67 kg)  01/25/13 149 lb 11.1 oz (67.9 kg)     HEENT mild turbinate edema.  Oropharynx no thrush or excess pnd or cobblestoning.  No JVD or cervical adenopathy. Mild accessory muscle hypertrophy. Trachea midline, nl thryroid. Chest was hyperinflated by percussion with diminished breath sounds and moderate increased exp time without wheeze. Hoover sign positive at mid inspiration. Regular rate and rhythm without murmur gallop or rub or increase P2 or edema.  Abd: no hsm, nl excursion. Ext warm without cyanosis or clubbing.    CXR 03/17/13 Emphysema without acute finding.   Assessment:

## 2013-05-10 NOTE — Patient Instructions (Addendum)
For breathing  Plan A = automatic = symbicort 160 and spiriva daily no matter what ( symbicort is 2 puffs every 12hours where spiriva is just one capsule each am)  Plan B = backup = Albuterol (proventil) 2 puffs every 4 hours   Pan C = Nebulizer Albuterol (not ipatropium) up to every 4 hours if albuterol puffer not working  Stop lisinopril and check with Dr Townsend Roger for a substitute ( since you say you are not taking it anyway it should be fine to leave it off completely until your visit)  The key is to stop smoking completely before smoking completely stops you!   Please schedule a follow up office visit in 4 weeks, sooner if needed with pfts   .

## 2013-05-13 ENCOUNTER — Encounter: Payer: Self-pay | Admitting: Internal Medicine

## 2013-05-13 NOTE — Assessment & Plan Note (Signed)
Adequate control on present rx, reviewed > no change in rx needed   

## 2013-05-13 NOTE — Assessment & Plan Note (Signed)
ACE inhibitors are problematic in  pts with airway complaints because  even experienced pulmonologists can't always distinguish ace effects from copd/asthma.  By themselves they don't actually cause a problem, much like oxygen can't by itself start a fire, but they certainly serve as a powerful catalyst or enhancer for any "fire"  or inflammatory process in the upper airway, be it caused by an ET  tube or more commonly reflux (especially in the obese or pts with known GERD or who are on biphoshonates).    Since says not really  not taking anyway rec stay off acei completely and add alternative if bp rises

## 2013-05-13 NOTE — Assessment & Plan Note (Signed)
DDX of  difficult airways managment all start with A and  include Adherence, Ace Inhibitors, Acid Reflux, Active Sinus Disease, Alpha 1 Antitripsin deficiency, Anxiety masquerading as Airways dz,  ABPA,  allergy(esp in young), Aspiration (esp in elderly), Adverse effects of DPI,  Active smokers, plus two Bs  = Bronchiectasis and Beta blocker use..and one C= CHF   Adherence is always the initial "prime suspect" and is a multilayered concern that requires a "trust but verify" approach in every patient - starting with knowing how to use medications, especially inhalers, correctly, keeping up with refills and understanding the fundamental difference between maintenance and prns vs those medications only taken for a very short course and then stopped and not refilled. This will be the biggest hurdle for outpt management and in the end he may not benefit from specialty care if he can't keep his meds straight/  The proper method of use, as well as anticipated side effects, of a metered-dose inhaler are discussed and demonstrated to the patient. Improved effectiveness after extensive coaching during this visit to a level of approximately  75% so continue symbicort and spiriva for now  ? ACEi effect > see HBP  Active smoking > see Smoker a/p

## 2013-05-13 NOTE — Assessment & Plan Note (Signed)

## 2013-05-16 ENCOUNTER — Emergency Department (HOSPITAL_COMMUNITY): Payer: Medicare Other

## 2013-05-16 ENCOUNTER — Observation Stay (HOSPITAL_COMMUNITY)
Admission: EM | Admit: 2013-05-16 | Discharge: 2013-05-17 | Disposition: A | Discharge status: Home / Self Care | Payer: Medicare Other | Attending: Internal Medicine | Admitting: Internal Medicine

## 2013-05-16 ENCOUNTER — Encounter (HOSPITAL_COMMUNITY): Payer: Self-pay | Admitting: Emergency Medicine

## 2013-05-16 DIAGNOSIS — E119 Type 2 diabetes mellitus without complications: Secondary | ICD-10-CM | POA: Diagnosis present

## 2013-05-16 DIAGNOSIS — R1310 Dysphagia, unspecified: Secondary | ICD-10-CM | POA: Diagnosis present

## 2013-05-16 DIAGNOSIS — R079 Chest pain, unspecified: Principal | ICD-10-CM | POA: Diagnosis present

## 2013-05-16 DIAGNOSIS — F419 Anxiety disorder, unspecified: Secondary | ICD-10-CM

## 2013-05-16 DIAGNOSIS — I480 Paroxysmal atrial fibrillation: Secondary | ICD-10-CM

## 2013-05-16 DIAGNOSIS — J9620 Acute and chronic respiratory failure, unspecified whether with hypoxia or hypercapnia: Secondary | ICD-10-CM

## 2013-05-16 DIAGNOSIS — Z720 Tobacco use: Secondary | ICD-10-CM

## 2013-05-16 DIAGNOSIS — J441 Chronic obstructive pulmonary disease with (acute) exacerbation: Secondary | ICD-10-CM

## 2013-05-16 DIAGNOSIS — R112 Nausea with vomiting, unspecified: Secondary | ICD-10-CM

## 2013-05-16 DIAGNOSIS — R600 Localized edema: Secondary | ICD-10-CM

## 2013-05-16 DIAGNOSIS — IMO0002 Reserved for concepts with insufficient information to code with codable children: Secondary | ICD-10-CM

## 2013-05-16 DIAGNOSIS — I2781 Cor pulmonale (chronic): Secondary | ICD-10-CM

## 2013-05-16 DIAGNOSIS — F17200 Nicotine dependence, unspecified, uncomplicated: Secondary | ICD-10-CM

## 2013-05-16 DIAGNOSIS — D696 Thrombocytopenia, unspecified: Secondary | ICD-10-CM

## 2013-05-16 DIAGNOSIS — E871 Hypo-osmolality and hyponatremia: Secondary | ICD-10-CM

## 2013-05-16 DIAGNOSIS — F329 Major depressive disorder, single episode, unspecified: Secondary | ICD-10-CM

## 2013-05-16 DIAGNOSIS — J449 Chronic obstructive pulmonary disease, unspecified: Secondary | ICD-10-CM | POA: Diagnosis present

## 2013-05-16 DIAGNOSIS — D72829 Elevated white blood cell count, unspecified: Secondary | ICD-10-CM

## 2013-05-16 DIAGNOSIS — I10 Essential (primary) hypertension: Secondary | ICD-10-CM | POA: Diagnosis present

## 2013-05-16 DIAGNOSIS — J4489 Other specified chronic obstructive pulmonary disease: Secondary | ICD-10-CM | POA: Insufficient documentation

## 2013-05-16 DIAGNOSIS — R5381 Other malaise: Secondary | ICD-10-CM

## 2013-05-16 DIAGNOSIS — R0602 Shortness of breath: Secondary | ICD-10-CM

## 2013-05-16 DIAGNOSIS — F32A Depression, unspecified: Secondary | ICD-10-CM

## 2013-05-16 DIAGNOSIS — D649 Anemia, unspecified: Secondary | ICD-10-CM

## 2013-05-16 DIAGNOSIS — K224 Dyskinesia of esophagus: Secondary | ICD-10-CM

## 2013-05-16 DIAGNOSIS — F4310 Post-traumatic stress disorder, unspecified: Secondary | ICD-10-CM

## 2013-05-16 DIAGNOSIS — K56600 Partial intestinal obstruction, unspecified as to cause: Secondary | ICD-10-CM

## 2013-05-16 DIAGNOSIS — N400 Enlarged prostate without lower urinary tract symptoms: Secondary | ICD-10-CM

## 2013-05-16 DIAGNOSIS — J9611 Chronic respiratory failure with hypoxia: Secondary | ICD-10-CM

## 2013-05-16 LAB — URINALYSIS W MICROSCOPIC + REFLEX CULTURE
Bilirubin Urine: NEGATIVE
Nitrite: NEGATIVE
Protein, ur: NEGATIVE mg/dL
Specific Gravity, Urine: 1.02 (ref 1.005–1.030)
Urobilinogen, UA: 0.2 mg/dL (ref 0.0–1.0)

## 2013-05-16 LAB — CBC WITH DIFFERENTIAL/PLATELET
Basophils Absolute: 0 10*3/uL (ref 0.0–0.1)
Basophils Relative: 0 % (ref 0–1)
MCHC: 32.6 g/dL (ref 30.0–36.0)
MCV: 91.4 fL (ref 78.0–100.0)
Neutro Abs: 7.7 10*3/uL (ref 1.7–7.7)
Neutrophils Relative %: 71 % (ref 43–77)
RDW: 15 % (ref 11.5–15.5)

## 2013-05-16 LAB — BASIC METABOLIC PANEL
Chloride: 103 mEq/L (ref 96–112)
Creatinine, Ser: 0.84 mg/dL (ref 0.50–1.35)
GFR calc Af Amer: >90 mL/min (ref >90)
GFR calc non Af Amer: 86 mL/min — ABNORMAL LOW (ref >90)
Potassium: 4.1 mEq/L (ref 3.5–5.1)

## 2013-05-16 LAB — TROPONIN I
Troponin I: <0.3 ng/mL (ref <0.30)
Troponin I: <0.3 ng/mL (ref <0.30)

## 2013-05-16 MED ORDER — ACETAMINOPHEN 650 MG RE SUPP: 650.0000 mg | Freq: Four times a day (QID) | RECTAL | Status: DC | PRN

## 2013-05-16 MED ORDER — TIOTROPIUM BROMIDE MONOHYDRATE 18 MCG IN CAPS
18.0000 ug | ORAL_CAPSULE | Freq: Every day | RESPIRATORY_TRACT | Status: DC
  Administered 2013-05-17: 18 ug via RESPIRATORY_TRACT
  Filled 2013-05-16: qty 5

## 2013-05-16 MED ORDER — MORPHINE SULFATE 4 MG/ML IJ SOLN: 4.0000 mg | INTRAMUSCULAR | Status: DC | PRN

## 2013-05-16 MED ORDER — INSULIN ASPART 100 UNIT/ML ~~LOC~~ SOLN
0.0000 [IU] | Freq: Three times a day (TID) | SUBCUTANEOUS | Status: DC
  Administered 2013-05-16: 5 [IU] via SUBCUTANEOUS
  Administered 2013-05-17: 1 [IU] via SUBCUTANEOUS

## 2013-05-16 MED ORDER — LORAZEPAM 1 MG PO TABS
0.5000 mg | ORAL_TABLET | Freq: Once | ORAL | Status: AC
  Administered 2013-05-16: 0.5 mg via ORAL
  Filled 2013-05-16: qty 1

## 2013-05-16 MED ORDER — SODIUM CHLORIDE 0.9 % IJ SOLN: 3.0000 mL | INTRAMUSCULAR | Status: DC | PRN

## 2013-05-16 MED ORDER — ALBUTEROL (5 MG/ML) CONTINUOUS INHALATION SOLN
10.0000 mg/h | INHALATION_SOLUTION | Freq: Once | RESPIRATORY_TRACT | Status: AC
  Administered 2013-05-16: 10 mg/h via RESPIRATORY_TRACT
  Filled 2013-05-16: qty 20

## 2013-05-16 MED ORDER — ONDANSETRON HCL 4 MG/2ML IJ SOLN: 4.0000 mg | Freq: Four times a day (QID) | INTRAMUSCULAR | Status: DC | PRN

## 2013-05-16 MED ORDER — METHYLPREDNISOLONE SODIUM SUCC 125 MG IJ SOLR
125.0000 mg | Freq: Once | INTRAMUSCULAR | Status: AC
  Administered 2013-05-16: 125 mg via INTRAVENOUS
  Filled 2013-05-16: qty 2

## 2013-05-16 MED ORDER — NITROGLYCERIN 0.4 MG SL SUBL: 0.4000 mg | SUBLINGUAL_TABLET | SUBLINGUAL | Status: DC | PRN

## 2013-05-16 MED ORDER — SODIUM CHLORIDE 0.9 % IJ SOLN
3.0000 mL | Freq: Two times a day (BID) | INTRAMUSCULAR | Status: DC
  Administered 2013-05-16 – 2013-05-17 (×2): 3 mL via INTRAVENOUS

## 2013-05-16 MED ORDER — SODIUM CHLORIDE 0.9 % IJ SOLN
3.0000 mL | Freq: Two times a day (BID) | INTRAMUSCULAR | Status: DC
  Administered 2013-05-17: 3 mL via INTRAVENOUS

## 2013-05-16 MED ORDER — ASPIRIN 81 MG PO CHEW
324.0000 mg | CHEWABLE_TABLET | Freq: Once | ORAL | Status: AC
  Administered 2013-05-16: 324 mg via ORAL
  Filled 2013-05-16: qty 4

## 2013-05-16 MED ORDER — ENOXAPARIN SODIUM 40 MG/0.4ML ~~LOC~~ SOLN
40.0000 mg | SUBCUTANEOUS | Status: DC
  Administered 2013-05-16: 40 mg via SUBCUTANEOUS
  Filled 2013-05-16: qty 0.4

## 2013-05-16 MED ORDER — ALBUTEROL SULFATE (5 MG/ML) 0.5% IN NEBU: 2.5000 mg | INHALATION_SOLUTION | RESPIRATORY_TRACT | Status: DC | PRN

## 2013-05-16 MED ORDER — SENNOSIDES-DOCUSATE SODIUM 8.6-50 MG PO TABS: 1.0000 | ORAL_TABLET | Freq: Every evening | ORAL | Status: DC | PRN

## 2013-05-16 MED ORDER — TAMSULOSIN HCL 0.4 MG PO CAPS
0.4000 mg | ORAL_CAPSULE | Freq: Every day | ORAL | Status: DC
  Administered 2013-05-16: 0.4 mg via ORAL
  Filled 2013-05-16: qty 1

## 2013-05-16 MED ORDER — BUDESONIDE-FORMOTEROL FUMARATE 160-4.5 MCG/ACT IN AERO
2.0000 | INHALATION_SPRAY | Freq: Two times a day (BID) | RESPIRATORY_TRACT | Status: DC
  Administered 2013-05-16 – 2013-05-17 (×2): 2 via RESPIRATORY_TRACT
  Filled 2013-05-16: qty 6

## 2013-05-16 MED ORDER — SODIUM CHLORIDE 0.9 % IV SOLN: 250.0000 mL | INTRAVENOUS | Status: DC | PRN

## 2013-05-16 MED ORDER — ACETAMINOPHEN 325 MG PO TABS: 650.0000 mg | ORAL_TABLET | Freq: Four times a day (QID) | ORAL | Status: DC | PRN

## 2013-05-16 MED ORDER — ONDANSETRON HCL 4 MG PO TABS: 4.0000 mg | ORAL_TABLET | Freq: Four times a day (QID) | ORAL | Status: DC | PRN

## 2013-05-16 MED ORDER — ALPRAZOLAM 0.25 MG PO TABS
0.2500 mg | ORAL_TABLET | Freq: Three times a day (TID) | ORAL | Status: DC
  Administered 2013-05-16 – 2013-05-17 (×2): 0.25 mg via ORAL
  Filled 2013-05-16 (×2): qty 1

## 2013-05-16 MED ORDER — ALBUTEROL SULFATE HFA 108 (90 BASE) MCG/ACT IN AERS
2.0000 | INHALATION_SPRAY | Freq: Four times a day (QID) | RESPIRATORY_TRACT | Status: DC | PRN
  Administered 2013-05-17 (×2): 2 via RESPIRATORY_TRACT
  Filled 2013-05-16: qty 6.7

## 2013-05-16 MED ORDER — OXYCODONE HCL 5 MG PO TABS
5.0000 mg | ORAL_TABLET | ORAL | Status: DC | PRN
  Administered 2013-05-16 – 2013-05-17 (×3): 5 mg via ORAL
  Filled 2013-05-16 (×3): qty 1

## 2013-05-16 MED ORDER — INSULIN ASPART 100 UNIT/ML ~~LOC~~ SOLN
3.0000 [IU] | Freq: Three times a day (TID) | SUBCUTANEOUS | Status: DC
  Administered 2013-05-16 – 2013-05-17 (×2): 3 [IU] via SUBCUTANEOUS

## 2013-05-16 MED ORDER — ALBUTEROL SULFATE (5 MG/ML) 0.5% IN NEBU: 2.5000 mg | INHALATION_SOLUTION | RESPIRATORY_TRACT | Status: DC

## 2013-05-16 MED ORDER — IPRATROPIUM BROMIDE 0.02 % IN SOLN
1.0000 mg | Freq: Once | RESPIRATORY_TRACT | Status: AC
  Administered 2013-05-16: 1 mg via RESPIRATORY_TRACT
  Filled 2013-05-16: qty 5

## 2013-05-16 NOTE — Progress Notes (Signed)
Per  EDMD, pt is being admitted to [facility] for chest pain, and is requesting admission to ALF or NH. Spoke with pt and he states he wants to go to [place], ALF/rest home here in [place], if possible. He says [facility] had him set up to go there, but for some reason he did not understand at the time, this did not happen.he has plans to give his son his home when he goes to ALF. Explained that he will never qualify for medicaid if he gives away his home, and will have to private pay for ALF. He says he make a fairly good amt of SS, and VA pension. Explained that CM will refer to CSW, who assists with placement, for follow-up on this when he gets to his room on the unit. But explained that this may not happen at D/C from here. He may have to go home with [facility] social worker to continue to assist with this. CSW notified of above discussion

## 2013-05-16 NOTE — H&P (Signed)
[facility]          History and Physical    PCP:   Josue Hector, MD   Chief Complaint:  Chest pain  HPI: Patient is a 72 year old man well known to this hospital for recurrent ED and hospital admissions for chest pain as well as COPD exacerbation. Past medical history significant for COPD oxygen dependent, diabetes, hypertension. He has had workup this year for chest pain including a 2-D echo shows an ejection fraction of 55-60% with no wall motion abnormality, he's had a CT angio without evidence for PE. He admits to not being able to adequately care for himself at home and he really wants to be placed in an assisted-living facility. He denies any shortness of breath above his baseline, no nausea, vomiting. His chest pain was described as substernal, a dull ache. Does not radiate nothing makes it better or worse. Has been constant for the past 4 days. Initial troponin is negative. We have been asked to admit him for further evaluation and management.  Allergies:   Allergies  Allergen Reactions  . Aspirin Other (See Comments)    Stomach upset      Past Medical History  Diagnosis Date  . Essential hypertension, benign   . Alcohol abuse   . PTSD (post-traumatic stress disorder)   . Chronic back pain   . Depression   . Peripheral neuropathy   . DJD (degenerative joint disease)   . COPD (chronic obstructive pulmonary disease)   . Anemia   . Myocardial infarction   . Type II or unspecified type diabetes mellitus without mention of complication, not stated as uncontrolled   . Esophageal dysmotility 07/04/2011  . Chronic respiratory failure with hypoxia   . Anxiety   . Paroxysmal atrial fibrillation 08/29/2012  . Partial small bowel obstruction 09/02/2012  . Bilateral lower extremity edema 09/03/2012    Possibly secondary to calcium channel blocker.    Past Surgical History  Procedure Laterality Date  . Hernia repair      X 3  . Spinal surgeries       X 4  . Appendectomy    . Prostate surgery      Prior to Admission medications   Medication Sig Start Date End Date Taking? Authorizing Provider  albuterol (PROVENTIL HFA;VENTOLIN HFA) 108 (90 BASE) MCG/ACT inhaler Inhale 2 puffs into the lungs every 6 (six) hours as needed for wheezing.   Yes Historical Provider, MD  albuterol (PROVENTIL) (2.5 MG/3ML) 0.083% nebulizer solution Take 2.5 mg by nebulization every 6 (six) hours as needed for wheezing or shortness of breath.   Yes Historical Provider, MD  ALPRAZolam Prudy Feeler) 0.25 MG tablet Take 1 tablet by mouth 3 (three) times daily. 05/06/13  Yes Historical Provider, MD  budesonide-formoterol (SYMBICORT) 160-4.5 MCG/ACT inhaler Inhale 2 puffs into the lungs 2 (two) times daily. 09/04/12 11/17/13 Yes Elliot Cousin, MD  furosemide (LASIX) 20 MG tablet Take 20 mg by mouth daily. For swelling in your feet and legs. Take potassium supplement with this medicine. 09/04/12  Yes Elliot Cousin, MD  metFORMIN (GLUCOPHAGE) 1000 MG tablet Take 1,000 mg by mouth 2 (two) times daily.   Yes Historical Provider, MD  Tamsulosin HCl (FLOMAX) 0.4 MG CAPS Take 1 capsule (0.4 mg total) by mouth daily after supper. 08/24/12  Yes Rhetta Mura, MD  tiotropium (SPIRIVA) 18 MCG inhalation capsule Place 18 mcg into inhaler and inhale daily.   Yes Historical Provider, MD    Social History:  reports that  he has been smoking Cigarettes.  He has a 27.5 pack-year smoking history. He has never used smokeless tobacco. He reports that he drinks alcohol. He reports that he does not use illicit drugs.  Family History  Problem Relation Age of Onset  . Lung disease Mother   . Prostate cancer Father     Review of Systems:  Constitutional: Denies fever, chills, diaphoresis, appetite change and fatigue.  HEENT: Denies photophobia, eye pain, redness, hearing loss, ear pain, congestion, sore throat, rhinorrhea, sneezing, mouth sores, trouble swallowing, neck pain, neck stiffness and  tinnitus.   Respiratory: Denies SOB, DOE (above baseline), cough, and wheezing.   Cardiovascular: Denies chest pain, palpitations and leg swelling.  Gastrointestinal: Denies nausea, vomiting, abdominal pain, diarrhea, constipation, blood in stool and abdominal distention.  Genitourinary: Denies dysuria, urgency, frequency, hematuria, flank pain and difficulty urinating.  Endocrine: Denies: hot or cold intolerance, sweats, changes in hair or nails, polyuria, polydipsia. Musculoskeletal: Denies myalgias, back pain, joint swelling, arthralgias and gait problem.  Skin: Denies pallor, rash and wound.  Neurological: Denies dizziness, seizures, syncope, weakness, light-headedness, numbness and headaches.  Hematological: Denies adenopathy. Easy bruising, personal or family bleeding history  Psychiatric/Behavioral: Denies suicidal ideation, mood changes, confusion, nervousness, sleep disturbance and agitation   Physical Exam: Blood pressure 159/42, pulse 114, temperature 98 F (36.7 C), temperature source Oral, resp. rate 22, height 5\' 4"  (1.626 m), weight 71.6 kg (157 lb 13.6 oz), SpO2 98.00%. General: Alert, awake, oriented x3, in no distress. HEENT: Normocephalic, atraumatic, pupils equal and reactive to light. Neck: Supple, no JVD, no lymphadenopathy, no bruits, no goiter Cardiovascular: Regular rate and rhythm. Lungs:" Bilaterally, no wheezes. Abdomen: Obese, slightly distended, nontender, positive bowel sounds. Extremities: 1+ pitting edema bilaterally, positive pedal pulses Neurologic: Grossly intact and nonfocal.  Labs on Admission:  Results for orders placed during the hospital encounter of 05/16/13 (from the past 48 hour(s))  CBC WITH DIFFERENTIAL     Status: Abnormal   Collection Time    05/16/13 11:55 AM      Result Value Range   WBC 10.9 (*) 4.0 - 10.5 K/uL   RBC 3.36 (*) 4.22 - 5.81 MIL/uL   Hemoglobin 10.0 (*) 13.0 - 17.0 g/dL   HCT 16.1 (*) 09.6 - 04.5 %   MCV 91.4  78.0 -  100.0 fL   MCH 29.8  26.0 - 34.0 pg   MCHC 32.6  30.0 - 36.0 g/dL   RDW 40.9  81.1 - 91.4 %   Platelets 220  150 - 400 K/uL   Neutrophils Relative % 71  43 - 77 %   Neutro Abs 7.7  1.7 - 7.7 K/uL   Lymphocytes Relative 20  12 - 46 %   Lymphs Abs 2.1  0.7 - 4.0 K/uL   Monocytes Relative 7  3 - 12 %   Monocytes Absolute 0.8  0.1 - 1.0 K/uL   Eosinophils Relative 3  0 - 5 %   Eosinophils Absolute 0.3  0.0 - 0.7 K/uL   Basophils Relative 0  0 - 1 %   Basophils Absolute 0.0  0.0 - 0.1 K/uL  BASIC METABOLIC PANEL     Status: Abnormal   Collection Time    05/16/13 11:55 AM      Result Value Range   Sodium 142  135 - 145 mEq/L   Potassium 4.1  3.5 - 5.1 mEq/L   Chloride 103  96 - 112 mEq/L   CO2 29  19 - 32 mEq/L  Glucose, Bld 106 (*) 70 - 99 mg/dL   BUN 16  6 - 23 mg/dL   Creatinine, Ser 1.61  0.50 - 1.35 mg/dL   Calcium 9.4  8.4 - 09.6 mg/dL   GFR calc non Af Amer 86 (*) >90 mL/min   GFR calc Af Amer >90  >90 mL/min   Comment: (NOTE)     The eGFR has been calculated using the CKD EPI equation.     This calculation has not been validated in all clinical situations.     eGFR's persistently <90 mL/min signify possible Chronic Kidney     Disease.  TROPONIN I     Status: None   Collection Time    05/16/13 11:55 AM      Result Value Range   Troponin I <0.30  <0.30 ng/mL   Comment:            Due to the release kinetics of cTnI,     a negative result within the first hours     of the onset of symptoms does not rule out     myocardial infarction with certainty.     If myocardial infarction is still suspected,     repeat the test at appropriate intervals.  URINALYSIS W MICROSCOPIC + REFLEX CULTURE     Status: Abnormal   Collection Time    05/16/13 12:14 PM      Result Value Range   Color, Urine YELLOW  YELLOW   APPearance CLEAR  CLEAR   Specific Gravity, Urine 1.020  1.005 - 1.030   pH 6.0  5.0 - 8.0   Glucose, UA NEGATIVE  NEGATIVE mg/dL   Hgb urine dipstick MODERATE (*)  NEGATIVE   Bilirubin Urine NEGATIVE  NEGATIVE   Ketones, ur NEGATIVE  NEGATIVE mg/dL   Protein, ur NEGATIVE  NEGATIVE mg/dL   Urobilinogen, UA 0.2  0.0 - 1.0 mg/dL   Nitrite NEGATIVE  NEGATIVE   Leukocytes, UA NEGATIVE  NEGATIVE   WBC, UA 0-2  <3 WBC/hpf   RBC / HPF 0-2  <3 RBC/hpf   Squamous Epithelial / LPF RARE  RARE    Radiological Exams on Admission: Dg Chest Portable 1 View  05/16/2013   CLINICAL DATA:  Shortness of breath for the past 3 days.  EXAM: PORTABLE CHEST - 1 VIEW  COMPARISON:  03/17/2013.  FINDINGS: The heart remains normal in size. Decreased depth of inspiration without gross change in prominence of the interstitial markings. Minimal left basilar atelectasis. The bones appear osteopenic.  IMPRESSION: 1. Decreased inspiration with minimal left basilar atelectasis. 2. Stable mild changes of COPD.   Electronically Signed   By: Gordan Payment M.D.   On: 05/16/2013 12:52    Assessment/Plan Principal Problem:   Chest pain Active Problems:   COPD pfts pending   DM type 2 (diabetes mellitus, type 2)   HTN (hypertension)   Dysphagia   Chest pain -Has had workup earlier this year for this issue. -EKG shows normal sinus rhythm with anterior Q waves that are old compared to prior EKGs. -Will admit as observation to telemetry to cycle troponins. -If troponins negative do not believe any further cardiac workup is mandated at this time. -His chest pain was thought to be GI in origin in the past.  COPD -He has 2-3 L oxygen dependent at home.  -Continue oxygen, nebulizers, inhalers.  Diabetes mellitus -Check A1c. -Discontinue metformin while in the hospital and place on a sensitive sliding scale insulin.  Hypertension -  elevated BP. -Currently not on any medications. -Will start him on Norvasc 5 mg daily.  Dysphagia -Thought to have an esophageal motility disorder. -Has been followed at [facility] for this issue.  DVT prophylaxis -Lovenox.  CODE STATUS -Full  code  Disposition -Patient states he wants to go to an assisted living facility. I have explained to him that I am not sure this can happen directly from the hospital given his observation status. -Will request case management and social work followup. -Will request PT/OT evaluations in case placement is possible.  Time Spent on Admission: 75 minutes  HERNANDEZ ACOSTA,ESTELA [facility] Pager: [phone] 05/16/2013, 4:42 PM

## 2013-05-16 NOTE — ED Notes (Signed)
Per ems, pt reports sob for the past few days.  Pt reports giving himself several breathing treatments at home w/out relief.  Pt given nebulizer treatment in route.

## 2013-05-16 NOTE — ED Notes (Signed)
Ambulated pt.  Pt unsteady on feet but was able to maintain 02 saturations 96%.

## 2013-05-16 NOTE — ED Notes (Signed)
Pt also reports some urinary incontinence for the past few days.  Pt reports " i think i need to check into an assisted living facility".

## 2013-05-16 NOTE — ED Provider Notes (Signed)
CSN: [id number]     Arrival date & time 05/16/13  1118 History   First MD Initiated Contact with Patient 05/16/13 1151     Chief Complaint  Patient presents with  . Shortness of Breath    HPI Pt was seen at 1155.   Per pt, c/o gradual onset and worsening of persistent cough, wheezing and SOB for the past 2 to 3 days.  Describes his symptoms as "my COPD is acting up."  Has been using home MDI and nebs without relief.  Denies CP/palpitations, no back pain, no abd pain, no N/V/D, no fevers, no rash.    Past Medical History  Diagnosis Date  . Essential hypertension, benign   . Alcohol abuse   . PTSD (post-traumatic stress disorder)   . Chronic back pain   . Depression   . Peripheral neuropathy   . DJD (degenerative joint disease)   . COPD (chronic obstructive pulmonary disease)   . Anemia   . Myocardial infarction   . Type II or unspecified type diabetes mellitus without mention of complication, not stated as uncontrolled   . Esophageal dysmotility 07/04/2011  . Chronic respiratory failure with hypoxia   . Anxiety   . Paroxysmal atrial fibrillation 08/29/2012  . Partial small bowel obstruction 09/02/2012  . Bilateral lower extremity edema 09/03/2012    Possibly secondary to calcium channel blocker.   Past Surgical History  Procedure Laterality Date  . Hernia repair      X 3  . Spinal surgeries      X 4  . Appendectomy    . Prostate surgery     Family History  Problem Relation Age of Onset  . Lung disease Mother   . Prostate cancer Father    History  Substance Use Topics  . Smoking status: Current Every Day Smoker -- 0.50 packs/day for 55 years    Types: Cigarettes  . Smokeless tobacco: Never Used  . Alcohol Use: Yes     Comment: Occasional    Review of Systems ROS: Statement: All systems negative except as marked or noted in the HPI; Constitutional: Negative for fever and chills. ; ; Eyes: Negative for eye pain, redness and discharge. ; ; ENMT: Negative for ear pain,  hoarseness, nasal congestion, sinus pressure and sore throat. ; ; Cardiovascular: Negative for chest pain, palpitations, diaphoresis, and peripheral edema. ; ; Respiratory: +cough, wheezing, SOB. Negative for stridor. ; ; Gastrointestinal: Negative for nausea, vomiting, diarrhea, abdominal pain, blood in stool, hematemesis, jaundice and rectal bleeding. . ; ; Genitourinary: Negative for dysuria, flank pain and hematuria. ; ; Musculoskeletal: Negative for back pain and neck pain. Negative for swelling and trauma.; ; Skin: Negative for pruritus, rash, abrasions, blisters, bruising and skin lesion.; ; Neuro: Negative for headache, lightheadedness and neck stiffness. Negative for weakness, altered level of consciousness , altered mental status, extremity weakness, paresthesias, involuntary movement, seizure and syncope.       Allergies  Aspirin  Home Medications   Current Outpatient Rx  Name  Route  Sig  Dispense  Refill  . albuterol (PROVENTIL HFA;VENTOLIN HFA) 108 (90 BASE) MCG/ACT inhaler   Inhalation   Inhale 2 puffs into the lungs every 6 (six) hours as needed for wheezing.         [name] albuterol (PROVENTIL) (2.5 MG/3ML) 0.083% nebulizer solution   Nebulization   Take 2.5 mg by nebulization every 6 (six) hours as needed for wheezing or shortness of breath.         [name]  ALPRAZolam (XANAX) 0.25 MG tablet   Oral   Take 1 tablet by mouth 3 (three) times daily.         . budesonide-formoterol (SYMBICORT) 160-4.5 MCG/ACT inhaler   Inhalation   Inhale 2 puffs into the lungs 2 (two) times daily.   1 Inhaler   2   . furosemide (LASIX) 20 MG tablet   Oral   Take 20 mg by mouth daily. For swelling in your feet and legs. Take potassium supplement with this medicine.         . metFORMIN (GLUCOPHAGE) 1000 MG tablet   Oral   Take 1,000 mg by mouth 2 (two) times daily.         . Tamsulosin HCl (FLOMAX) 0.4 MG CAPS   Oral   Take 1 capsule (0.4 mg total) by mouth daily after supper.   30  capsule   0   . tiotropium (SPIRIVA) 18 MCG inhalation capsule   Inhalation   Place 18 mcg into inhaler and inhale daily.          BP 160/72  Pulse 116  Temp(Src) 98.2 F (36.8 C) (Oral)  Resp 17  SpO2 98% Physical Exam 1200: Physical examination:  Nursing notes reviewed; Vital signs and O2 SAT reviewed;  Constitutional: Well developed, Well nourished, In no acute distress; Head:  Normocephalic, atraumatic; Eyes: EOMI, PERRL, No scleral icterus; ENMT: Mouth and pharynx normal, Mucous membranes dry; Neck: Supple, Full range of motion, No lymphadenopathy; Cardiovascular: Tachycardic rate and rhythm, No gallop; Respiratory: Breath sounds diminished & equal bilaterally, scattered wheezes. No audible wheezing. Speaking full sentences, sitting upright on side of stretcher. Tachypneic, no retrax or access mm use.; Chest: Nontender, Movement normal; Abdomen: Soft, Nontender, Nondistended, Normal bowel sounds; Genitourinary: No CVA tenderness; Extremities: Pulses normal, No tenderness, No edema, No calf edema or asymmetry.; Neuro: AA&Ox3, Major CN grossly intact.  Speech clear. No gross focal motor or sensory deficits in extremities.; Skin: Color normal, Warm, Dry.    ED Course  Procedures    EKG Interpretation     Ventricular Rate:  114 PR Interval:  136 QRS Duration: 72 QT Interval:  324 QTC Calculation: 446 R Axis:   65 Text Interpretation:  Sinus tachycardia Anterior infarct (cited on or before 19-Mar-2013) Abnormal ECG When compared with ECG of 19-Mar-2013 18:56, Vent. rate has increased BY  39 BPM            MDM  MDM Reviewed: previous chart, nursing note and vitals Reviewed previous: labs and ECG Interpretation: labs, ECG and x-ray Total time providing critical care: 30-74 minutes. This excludes time spent performing separately reportable procedures and services. Consults: admitting MD   CRITICAL CARE Performed by: Laray Anger Total critical care time:  35 Critical care time was exclusive of separately billable procedures and treating other patients. Critical care was necessary to treat or prevent imminent or life-threatening deterioration. Critical care was time spent personally by me on the following activities: development of treatment plan with patient and/or surrogate as well as nursing, discussions with consultants, evaluation of patient's response to treatment, examination of patient, obtaining history from patient or surrogate, ordering and performing treatments and interventions, ordering and review of laboratory studies, ordering and review of radiographic studies, pulse oximetry and re-evaluation of patient's condition.   Results for orders placed during the hospital encounter of 05/16/13  CBC WITH DIFFERENTIAL      Result Value Range   WBC 10.9 (*) 4.0 - 10.5 K/uL   RBC  3.36 (*) 4.22 - 5.81 MIL/uL   Hemoglobin 10.0 (*) 13.0 - 17.0 g/dL   HCT 16.1 (*) 09.6 - 04.5 %   MCV 91.4  78.0 - 100.0 fL   MCH 29.8  26.0 - 34.0 pg   MCHC 32.6  30.0 - 36.0 g/dL   RDW 40.9  81.1 - 91.4 %   Platelets 220  150 - 400 K/uL   Neutrophils Relative % 71  43 - 77 %   Neutro Abs 7.7  1.7 - 7.7 K/uL   Lymphocytes Relative 20  12 - 46 %   Lymphs Abs 2.1  0.7 - 4.0 K/uL   Monocytes Relative 7  3 - 12 %   Monocytes Absolute 0.8  0.1 - 1.0 K/uL   Eosinophils Relative 3  0 - 5 %   Eosinophils Absolute 0.3  0.0 - 0.7 K/uL   Basophils Relative 0  0 - 1 %   Basophils Absolute 0.0  0.0 - 0.1 K/uL  BASIC METABOLIC PANEL      Result Value Range   Sodium 142  135 - 145 mEq/L   Potassium 4.1  3.5 - 5.1 mEq/L   Chloride 103  96 - 112 mEq/L   CO2 29  19 - 32 mEq/L   Glucose, Bld 106 (*) 70 - 99 mg/dL   BUN 16  6 - 23 mg/dL   Creatinine, Ser 7.82  0.50 - 1.35 mg/dL   Calcium 9.4  8.4 - 95.6 mg/dL   GFR calc non Af Amer 86 (*) >90 mL/min   GFR calc Af Amer >90  >90 mL/min  TROPONIN I      Result Value Range   Troponin I <0.30  <0.30 ng/mL  URINALYSIS W  MICROSCOPIC + REFLEX CULTURE      Result Value Range   Color, Urine YELLOW  YELLOW   APPearance CLEAR  CLEAR   Specific Gravity, Urine 1.020  1.005 - 1.030   pH 6.0  5.0 - 8.0   Glucose, UA NEGATIVE  NEGATIVE mg/dL   Hgb urine dipstick MODERATE (*) NEGATIVE   Bilirubin Urine NEGATIVE  NEGATIVE   Ketones, ur NEGATIVE  NEGATIVE mg/dL   Protein, ur NEGATIVE  NEGATIVE mg/dL   Urobilinogen, UA 0.2  0.0 - 1.0 mg/dL   Nitrite NEGATIVE  NEGATIVE   Leukocytes, UA NEGATIVE  NEGATIVE   WBC, UA 0-2  <3 WBC/hpf   RBC / HPF 0-2  <3 RBC/hpf   Squamous Epithelial / LPF RARE  RARE   Dg Chest Portable 1 View 05/16/2013   CLINICAL DATA:  Shortness of breath for the past 3 days.  EXAM: PORTABLE CHEST - 1 VIEW  COMPARISON:  03/17/2013.  FINDINGS: The heart remains normal in size. Decreased depth of inspiration without gross change in prominence of the interstitial markings. Minimal left basilar atelectasis. The bones appear osteopenic.  IMPRESSION: 1. Decreased inspiration with minimal left basilar atelectasis. 2. Stable mild changes of COPD.   Electronically Signed   By: Gordan Payment M.D.   On: 05/16/2013 12:52    1425:  Hour long neb and IV solumedrol given. Ativan given per pt request "for my nerves." Lungs now diminished but CTA bilat, Sats 95-98% on O2 2L N/C. Pt ambulated with Sats remaining 96% on O2 2L N/C, but was unsteady on his feet. Pt now c/o left sided "heavy" chest pain, states he "just can't go home" and "needs to be admitted to an assisted living facility or NH."  Will  dose ASA, ntg SL and morphine IV. Pt aware that he can be admitted only under observation and may not be directly transferred to a NH or ALF upon his hospital discharge. Pt verb understanding. ED Social Worker made aware. T/C to [facility] Dr. Ardyth Harps, case discussed, including:  HPI, pertinent PM/SHx, VS/PE, dx testing, ED course and treatment:  Agreeable to observation admit, requests to write temporary orders, obtain tele bed to team  2.   Laray Anger, DO 05/17/13 [phone]

## 2013-05-17 DIAGNOSIS — D649 Anemia, unspecified: Secondary | ICD-10-CM

## 2013-05-17 LAB — CBC
Hemoglobin: 9.5 g/dL — ABNORMAL LOW (ref 13.0–17.0)
MCH: 30.1 pg (ref 26.0–34.0)
MCV: 90.8 fL (ref 78.0–100.0)
RBC: 3.16 MIL/uL — ABNORMAL LOW (ref 4.22–5.81)

## 2013-05-17 LAB — BASIC METABOLIC PANEL
CO2: 24 mEq/L (ref 19–32)
Creatinine, Ser: 0.84 mg/dL (ref 0.50–1.35)
GFR calc non Af Amer: 86 mL/min — ABNORMAL LOW (ref >90)
Glucose, Bld: 179 mg/dL — ABNORMAL HIGH (ref 70–99)
Potassium: 4.1 mEq/L (ref 3.5–5.1)
Sodium: 139 mEq/L (ref 135–145)

## 2013-05-17 LAB — GLUCOSE, CAPILLARY

## 2013-05-17 LAB — HEMOGLOBIN A1C
Hgb A1c MFr Bld: 5.5 % (ref <5.7)
Mean Plasma Glucose: 111 mg/dL (ref <117)

## 2013-05-17 LAB — TSH: TSH: 1.181 u[IU]/mL (ref 0.350–4.500)

## 2013-05-17 MED ORDER — AMLODIPINE BESYLATE 5 MG PO TABS: 5.0000 mg | ORAL_TABLET | Freq: Every day | ORAL | Status: DC

## 2013-05-17 NOTE — Progress Notes (Signed)
PT Cancellation Note  Patient Details Name: [name] MRN: [id number] DOB: 1940/12/12   Cancelled Treatment:    Reason Eval/Treat Not Completed: Other (comment) MD gives verbal order to d/c PT order (Dr. Ardyth Harps).  Pt to be discharged to home today.  [name] L 05/17/2013, 10:01 AM

## 2013-05-17 NOTE — Care Management Note (Signed)
    Page 1 of 1   05/17/2013     11:31:56 AM   CARE MANAGEMENT NOTE 05/17/2013  Patient:  [name], [name]   Account Number:  [id number]  Date Initiated:  05/17/2013  Documentation initiated by:  Rosemary Holms  Subjective/Objective Assessment:   Pt from home and wishes to return home with [facility] RN and SW. Previous HH with [facility]     Action/Plan:   Anticipated DC Date:  05/17/2013   Anticipated DC Plan:  HOME W HOME HEALTH SERVICES  In-house referral  Clinical Social Worker      DC Planning Services  CM consult      Choice offered to / List presented to:          [facility] arranged  HH-1 RN  HH-10 DISEASE MANAGEMENT  HH-6 SOCIAL WORKER      HH agency  [facility]   Status of service:  Completed, signed off Medicare Important Message given?   (If response is "NO", the following Medicare IM given date fields will be blank) Date Medicare IM given:   Date Additional Medicare IM given:    Discharge Disposition:  HOME W HOME HEALTH SERVICES  Per UR Regulation:    If discussed at Long Length of Stay Meetings, dates discussed:    Comments:  05/17/13 Rosemary Holms RN BSN CM

## 2013-05-17 NOTE — Clinical Social Work Psychosocial (Signed)
Clinical Social Work Department BRIEF PSYCHOSOCIAL ASSESSMENT 05/17/2013  Patient:  [name], [name]     Account Number:  [id number]     Admit date:  05/16/2013  Clinical Social Worker:  Nancie Neas  Date/Time:  05/17/2013 08:55 AM  Referred by:  Physician  Date Referred:  05/17/2013 Referred for  ALF Placement   Other Referral:   Interview type:  Patient Other interview type:    PSYCHOSOCIAL DATA Living Status:  ALONE Admitted from facility:   Level of care:   Primary support name:   Primary support relationship to patient:   Degree of support available:   limited per pt.    CURRENT CONCERNS Current Concerns  Post-Acute Placement   Other Concerns:    SOCIAL WORK ASSESSMENT / PLAN CSW met with pt at bedside following referral for possible ALF placement. Pt alert and oriented and very well known to CSW from previous admissions. Pt lives alone. He has very limited support. Pt's son was involved somewhat at some point but now reports he is on his own. Pt states that he does fairly well on his own. Yesterday in the ED pt was complaining of chest pain and is currently in observation. He said in the ED that he wanted to go to ALF. CSW has discussed this with pt many times before. By day of d/c, pt has declined to go in the past. CSW discussed placement again with pt. He reports that home health had set up for him to go to [facility] but he procrastinated too long and then couldn't go. Pt states that he doesn't think he would be happy at a facility and would feel "cooped up." Pt does not want to be placed from hospital. He is begging to go home today. CSW provided ALF list and encouraged pt to visit facilities so he can determine where he may be satisfied.   Assessment/plan status:  Referral to [facility] Other assessment/ plan:   Information/referral to community resources:   ALF list    PATIENT'S/FAMILY'S RESPONSE TO PLAN OF CARE: Pt requesting home health social  worker again to assist with ALF placement from home. Pt says he wants to be placed, but then when given the opportunity tends to refuse. CM aware of home health request. Pt plans to call [facility] for transport at d/c. CSW will sign off but can be reconsulted if needed.       Derenda Fennel, [place] [phone]

## 2013-05-17 NOTE — Progress Notes (Signed)
Called [facility] for transportation. Iv cath removed and intact. No pain/swelling at site. Upon removing IV tape pulled skin on left forearm. Tegaderm applied to site. Awaiting to take patient downstairs.

## 2013-05-17 NOTE — Discharge Summary (Addendum)
Physician Discharge Summary  [name] ZOX:[id number] DOB: [birth year] DOA: 05/16/2013  PCP: Josue Hector, MD  Admit date: 05/16/2013 Discharge date: 05/17/2013  Time spent: 45 minutes  Recommendations for Outpatient Follow-up:  -Advised to follow up with his PCP in 2 weeks. -HH RN and SW will be arranged by CM.   Discharge Diagnoses:  Principal Problem:   Chest pain Active Problems:   COPD pfts pending   DM type 2 (diabetes mellitus, type 2)   HTN (hypertension)   Dysphagia   Discharge Condition: Stable and improved  Filed Weights   05/16/13 1631  Weight: 71.6 kg (157 lb 13.6 oz)    History of present illness:  Patient is a 72 year old man well known to this hospital for recurrent ED and hospital admissions for chest pain as well as COPD exacerbation. Past medical history significant for COPD oxygen dependent, diabetes, hypertension. He has had workup this year for chest pain including a 2-D echo shows an ejection fraction of 55-60% with no wall motion abnormality, he's had a CT angio without evidence for PE. He admits to not being able to adequately care for himself at home and he really wants to be placed in an assisted-living facility. He denies any shortness of breath above his baseline, no nausea, vomiting. His chest pain was described as substernal, a dull ache. Does not radiate nothing makes it better or worse. Has been constant for the past 4 days. Initial troponin is negative. We were asked to admit him for further evaluation and management.   Hospital Course:   Chest Pain -Has r/o for MI. -No further cardiac work up mandated at this point. -His CP has been thought to be from esophageal dysmotility and he is seeing a specialist at [facility] for it.  COPD -He uses 2-3L oxygen at home. -No acute exacerbation.  Diabetes mellitus  -Continue metformin on DC.  Hypertension  - elevated BP.  -Currently not on any medications.  -Will start him on Norvasc  5 mg daily.   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  -Has been followed at [facility] for this issue.   Procedures:  None   Consultations:  None  Discharge Instructions      Discharge Orders   Future Appointments Provider Department Dept Phone   06/09/2013 11:00 AM [facility] P[facility] Room [facility] [phone]   06/09/2013 12:00 PM Nyoka Cowden, MD [facility] [phone]   Future Orders Complete By Expires   Diet - low sodium heart healthy  As directed    Discontinue IV  As directed    Increase activity slowly  As directed        Medication List         albuterol 108 (90 BASE) MCG/ACT inhaler  Commonly known as:  PROVENTIL HFA;VENTOLIN HFA  Inhale 2 puffs into the lungs every 6 (six) hours as needed for wheezing.     albuterol (2.5 MG/3ML) 0.083% nebulizer solution  Commonly known as:  PROVENTIL  Take 2.5 mg by nebulization every 6 (six) hours as needed for wheezing or shortness of breath.     ALPRAZolam 0.25 MG tablet  Commonly known as:  XANAX  Take 1 tablet by mouth 3 (three) times daily.     amLODipine 5 MG tablet  Commonly known as:  NORVASC  Take 1 tablet (5 mg total) by mouth daily.     budesonide-formoterol 160-4.5 MCG/ACT inhaler  Commonly known as:  SYMBICORT  Inhale 2 puffs into  the lungs 2 (two) times daily.     furosemide 20 MG tablet  Commonly known as:  LASIX  Take 20 mg by mouth daily. For swelling in your feet and legs. Take potassium supplement with this medicine.     metFORMIN 1000 MG tablet  Commonly known as:  GLUCOPHAGE  Take 1,000 mg by mouth 2 (two) times daily.     tamsulosin 0.4 MG Caps capsule  Commonly known as:  FLOMAX  Take 1 capsule (0.4 mg total) by mouth daily after supper.     tiotropium 18 MCG inhalation capsule  Commonly known as:  SPIRIVA  Place 18 mcg into inhaler and inhale daily.       Allergies  Allergen Reactions  . Aspirin Other (See Comments)    Stomach  upset   Follow-up Information   Follow up with Josue Hector, MD. Schedule an appointment as soon as possible for a visit in 2 weeks.   Specialty:  Family Medicine   Contact information:   [address] [place] [address] [phone]        The results of significant diagnostics from this hospitalization (including imaging, microbiology, ancillary and laboratory) are listed below for reference.    Significant Diagnostic Studies: Dg Chest Portable 1 View  05/16/2013   CLINICAL DATA:  Shortness of breath for the past 3 days.  EXAM: PORTABLE CHEST - 1 VIEW  COMPARISON:  03/17/2013.  FINDINGS: The heart remains normal in size. Decreased depth of inspiration without gross change in prominence of the interstitial markings. Minimal left basilar atelectasis. The bones appear osteopenic.  IMPRESSION: 1. Decreased inspiration with minimal left basilar atelectasis. 2. Stable mild changes of COPD.   Electronically Signed   By: Gordan Payment M.D.   On: 05/16/2013 12:52    Microbiology: No results found for this or any previous visit (from the past 240 hour(s)).   Labs: Basic Metabolic Panel:  Recent Labs Lab 05/16/13 1155 05/17/13 0525  NA 142 139  K 4.1 4.1  CL 103 104  CO2 29 24  GLUCOSE 106* 179*  BUN 16 21  CREATININE 0.84 0.84  CALCIUM 9.4 9.3   Liver Function Tests: No results found for this basename: AST, ALT, ALKPHOS, BILITOT, PROT, ALBUMIN,  in the last 168 hours No results found for this basename: LIPASE, AMYLASE,  in the last 168 hours No results found for this basename: AMMONIA,  in the last 168 hours CBC:  Recent Labs Lab 05/16/13 1155 05/17/13 0525  WBC 10.9* 13.4*  NEUTROABS 7.7  --   HGB 10.0* 9.5*  HCT 30.7* 28.7*  MCV 91.4 90.8  PLT 220 209   Cardiac Enzymes:  Recent Labs Lab 05/16/13 1155 05/16/13 1755 05/16/13 2311 05/17/13 0525  TROPONINI <0.30 <0.30 <0.30 <0.30   BNP: BNP (last 3 results)  Recent Labs  08/19/12 0423  08/29/12 1043 01/24/13 1615  PROBNP 5740.0* 555.5* 110.5   CBG:  Recent Labs Lab 05/16/13 1719 05/16/13 2106 05/17/13 0758  GLUCAP 259* 197* 134*       Signed:  HERNANDEZ ACOSTA,ESTELA  [facility] Pager: [phone] 05/17/2013, 10:28 AM

## 2013-06-03 ENCOUNTER — Ambulatory Visit (INDEPENDENT_AMBULATORY_CARE_PROVIDER_SITE_OTHER): Payer: Medicare Other | Admitting: Cardiology

## 2013-06-03 VITALS — BP 92/51 | HR 96 | Ht 63.0 in | Wt 150.0 lb

## 2013-06-03 DIAGNOSIS — R002 Palpitations: Secondary | ICD-10-CM

## 2013-06-03 MED ORDER — DILTIAZEM HCL 30 MG PO TABS: 30.0000 mg | ORAL_TABLET | Freq: Two times a day (BID) | ORAL | Status: DC

## 2013-06-03 NOTE — Progress Notes (Signed)
Clinical Summary [name] is a 72 y.o.male   1. Chest pain - multiple admissions for chest pain in the past, with negative EKG and cardiac enzymes. Often presents in setting of COPD exacerbation. Has been thought to be muskuloskeletal or possible GI, he does have dysphagia and is followed at [facility] for this. He also has listed history of chronic pain syndrome - recent admit at [facility] for chest pain, no evidence of ischemia by EKG, negative enzymes.  CT PE 01/2013: no PE, changes consistent with COPD Ech Jan 2014: LVEF 55-60% - recent symptoms only associated with palpitations.l  2. HTN - compliant with meds  3. COPD - on home oxygen - followed by Dr Sherene Sires  4. Parox afib - not on anticoag due to poor compliance - has had some palpitations, occuring 2-3 times a weeks. Feeling of hear racing, with increased SOB and numbness in chest and left arm. + dizziness. Lasts 10-15 minutes.   Past Medical History  Diagnosis Date  . Essential hypertension, benign   . Alcohol abuse   . PTSD (post-traumatic stress disorder)   . Chronic back pain   . Depression   . Peripheral neuropathy   . DJD (degenerative joint disease)   . COPD (chronic obstructive pulmonary disease)   . Anemia   . Myocardial infarction   . Type II or unspecified type diabetes mellitus without mention of complication, not stated as uncontrolled   . Esophageal dysmotility 07/04/2011  . Chronic respiratory failure with hypoxia   . Anxiety   . Paroxysmal atrial fibrillation 08/29/2012  . Partial small bowel obstruction 09/02/2012  . Bilateral lower extremity edema 09/03/2012    Possibly secondary to calcium channel blocker.     Allergies  Allergen Reactions  . Aspirin Other (See Comments)    Stomach upset     Current Outpatient Prescriptions  Medication Sig Dispense Refill  . albuterol (PROVENTIL HFA;VENTOLIN HFA) 108 (90 BASE) MCG/ACT inhaler Inhale 2 puffs into the lungs every 6 (six) hours as needed  for wheezing.      [name] albuterol (PROVENTIL) (2.5 MG/3ML) 0.083% nebulizer solution Take 2.5 mg by nebulization every 6 (six) hours as needed for wheezing or shortness of breath.      . ALPRAZolam (XANAX) 0.25 MG tablet Take 1 tablet by mouth 3 (three) times daily.      [name] amLODipine (NORVASC) 5 MG tablet Take 1 tablet (5 mg total) by mouth daily.  30 tablet  0  . budesonide-formoterol (SYMBICORT) 160-4.5 MCG/ACT inhaler Inhale 2 puffs into the lungs 2 (two) times daily.  1 Inhaler  2  . furosemide (LASIX) 20 MG tablet Take 20 mg by mouth daily. For swelling in your feet and legs. Take potassium supplement with this medicine.      . metFORMIN (GLUCOPHAGE) 1000 MG tablet Take 1,000 mg by mouth 2 (two) times daily.      . Tamsulosin HCl (FLOMAX) 0.4 MG CAPS Take 1 capsule (0.4 mg total) by mouth daily after supper.  30 capsule  0  . tiotropium (SPIRIVA) 18 MCG inhalation capsule Place 18 mcg into inhaler and inhale daily.       No current facility-administered medications for this visit.     Past Surgical History  Procedure Laterality Date  . Hernia repair      X 3  . Spinal surgeries      X 4  . Appendectomy    . Prostate surgery  Allergies  Allergen Reactions  . Aspirin Other (See Comments)    Stomach upset      Family History  Problem Relation Age of Onset  . Lung disease Mother   . Prostate cancer Father      Social History [name] reports that he has been smoking Cigarettes.  He has a 27.5 pack-year smoking history. He has never used smokeless tobacco. [name] reports that he drinks alcohol.   Review of Systems CONSTITUTIONAL: No weight loss, fever, chills, weakness or fatigue.  HEENT: Eyes: No visual loss, blurred vision, double vision or yellow sclerae.No hearing loss, sneezing, congestion, runny nose or sore throat.  SKIN: No rash or itching.  CARDIOVASCULAR: per HPI RESPIRATORY: per HPI  GASTROINTESTINAL: No anorexia, nausea, vomiting or diarrhea. No  abdominal pain or blood.  GENITOURINARY: No burning on urination, no polyuria NEUROLOGICAL: No headache, dizziness, syncope, paralysis, ataxia, numbness or tingling in the extremities. No change in bowel or bladder control.  MUSCULOSKELETAL: No muscle, back pain, joint pain or stiffness.  LYMPHATICS: No enlarged nodes. No history of splenectomy.  PSYCHIATRIC: No history of depression or anxiety.  ENDOCRINOLOGIC: No reports of sweating, cold or heat intolerance. No polyuria or polydipsia.  [name]   Physical Examination p96 bp 92/51 Wt 150 lbs BMI 27 Gen: resting comfortably, no acute distress HEENT: no scleral icterus, pupils equal round and reactive, no palptable cervical adenopathy,  CV: RRR, no m/r/g, no JVD, no carotid bruits Resp: coarse bilaterally, prolonged expiration with mild wheezing GI: abdomen is soft, non-tender, non-distended, normal bowel sounds, no hepatosplenomegaly MSK: extremities are warm, no edema.  Skin: warm, no rash Neuro:  no focal deficits Psych: appropriate affect   Diagnostic Studies Jan 2014 Echo: LVEF 55-60%, no WMAs. Mild LAE, mild RAE    Assessment and Plan  1. Chest pain - atypical for cardiac chest pain with multiple negative workups when presenting to ER. Notes mention prior concern for possible GI or muskuloskeletal etiology.  - maybe related to symptomatic arrythmia as well, seems that he can have symptoms at that time as well  2. Palpitations - history of parox afib per notes, not on anticoag because of long history of non-compliance - not currently on any rate controlling agents - stop norvasc and lisinopril to allow room with blood pressure to start dilitiazem, will start low dose to begin with at 30mg  bid and titrate as needed - obtain 7 day event monitor to document exact rhythm during time of symptoms   3. HTN - stop norvasc and stop lisinopril (stopping the ACE-I was also recommended by his pulmonologist) - start dilt insetting of  symptomatic arrhythmia       [name], M.D., F.A.C.C.

## 2013-06-03 NOTE — Patient Instructions (Signed)
   Stop Norvasc  Stop Lisinopril  Begin Diltiazem 30mg  twice a day   Your physician has recommended that you wear a 7 day event monitor. Event monitors are medical devices that record the heart's electrical activity. Doctors most often [place] these monitors to diagnose arrhythmias. Arrhythmias are problems with the speed or rhythm of the heartbeat. The monitor is a small, portable device. You can wear one while you do your normal daily activities. This is usually used to diagnose what is causing palpitations/syncope (passing out). Office will contact with results via phone or letter.   Follow up in  3 weeks

## 2013-06-07 ENCOUNTER — Other Ambulatory Visit: Payer: Self-pay | Admitting: *Deleted

## 2013-06-07 DIAGNOSIS — R002 Palpitations: Secondary | ICD-10-CM

## 2013-06-08 ENCOUNTER — Other Ambulatory Visit: Payer: Self-pay | Admitting: Cardiology

## 2013-06-08 ENCOUNTER — Telehealth: Payer: Self-pay | Admitting: Cardiology

## 2013-06-08 NOTE — Telephone Encounter (Signed)
Pt was seen in office on 10-31 and Norvasc and Lisinopril was discontinued and he was started on Diltiazem. Pt started having chest pain and shortness of breath on Saturday was sent to [facility] and admitted pt returned to [facility] facility today and his FL2 said for him to take Lisinopril and Norvasc and not the Diltiazem. Nurse was questioning this because of the change that was made on Friday 06-03-13 by Dr. Wyline Mood. I informed nurse that I would send MD a message and that it would probably be tomorrow until I got back in touch with her. I informed her because the FL2 was the most current documentation that she had that she needed to follow that until we let her know otherwise. She verbalized understanding.

## 2013-06-08 NOTE — Telephone Encounter (Signed)
Opened in error

## 2013-06-09 ENCOUNTER — Ambulatory Visit: Payer: Self-pay | Admitting: Internal Medicine

## 2013-06-09 DIAGNOSIS — R002 Palpitations: Secondary | ICD-10-CM

## 2013-06-09 MED ORDER — DILTIAZEM HCL 30 MG PO TABS: 30.0000 mg | ORAL_TABLET | Freq: Two times a day (BID) | ORAL | Status: DC

## 2013-06-09 NOTE — Telephone Encounter (Signed)
[name] was notified of Dr. Wyline Mood note. She requested that I contact [name] at [facility] had make sure they had the right medication for pt.   Spoke with [name] at [facility] and informed her that Dr. Wyline Mood wanted pt to stop lisinopril and norvasc and start diltiazem 30 MG twice daily. [name] verbalized understanding.   [name], MD [name], CMA            Received notes from recent hospital discharge. Do we know why he went to the ER? I would still recommend stopping his lisinopril and norvasc, and starting the low dose diltiazem as described in my note Friday.    [name] MD

## 2013-06-20 ENCOUNTER — Telehealth: Payer: Self-pay | Admitting: Cardiology

## 2013-06-20 NOTE — Telephone Encounter (Signed)
Pt gave verbal ok to give results to Mrs. Sherrine Maples. Informed her that monitor results was consistent with diagnosis. She verbalized understanding and said she would inform pt.

## 2013-06-22 ENCOUNTER — Encounter (INDEPENDENT_AMBULATORY_CARE_PROVIDER_SITE_OTHER): Payer: Self-pay | Admitting: *Deleted

## 2013-06-24 ENCOUNTER — Ambulatory Visit: Payer: Medicare Other | Admitting: Cardiology

## 2013-07-06 ENCOUNTER — Ambulatory Visit (INDEPENDENT_AMBULATORY_CARE_PROVIDER_SITE_OTHER): Payer: Self-pay | Admitting: Internal Medicine

## 2013-07-08 ENCOUNTER — Encounter: Payer: Self-pay | Admitting: Internal Medicine

## 2013-07-08 ENCOUNTER — Ambulatory Visit (INDEPENDENT_AMBULATORY_CARE_PROVIDER_SITE_OTHER): Payer: Medicare Other | Admitting: Internal Medicine

## 2013-07-08 VITALS — BP 132/72 | HR 102 | Temp 98.0°F | Ht 62.0 in | Wt 153.0 lb

## 2013-07-08 DIAGNOSIS — F17200 Nicotine dependence, unspecified, uncomplicated: Secondary | ICD-10-CM

## 2013-07-08 DIAGNOSIS — J449 Chronic obstructive pulmonary disease, unspecified: Secondary | ICD-10-CM

## 2013-07-08 DIAGNOSIS — J9611 Chronic respiratory failure with hypoxia: Secondary | ICD-10-CM

## 2013-07-08 DIAGNOSIS — J9610 Chronic respiratory failure, unspecified whether with hypoxia or hypercapnia: Secondary | ICD-10-CM

## 2013-07-08 DIAGNOSIS — I279 Pulmonary heart disease, unspecified: Secondary | ICD-10-CM

## 2013-07-08 DIAGNOSIS — R0902 Hypoxemia: Secondary | ICD-10-CM

## 2013-07-08 DIAGNOSIS — I10 Essential (primary) hypertension: Secondary | ICD-10-CM

## 2013-07-08 LAB — PULMONARY FUNCTION TEST

## 2013-07-08 NOTE — Patient Instructions (Signed)
Plan A = automatic = symbicort 160 and spiriva daily no matter what ( symbicort is 2 puffs every 12hours where spiriva is just one capsule each am)  Plan B = backup = Albuterol (proventil) 2 puffs every 4 hours   Pan C =  Backup to plan B = Nebulizer Albuterol (not ipatropium) up to every 4 hours only  if albuterol puffer not working to your satisfaction  The key is to stop smoking completely before smoking completely stops you!   When you leave to go out on your own, make appt to see Tammy NP with all your medications and your formulary from the [place] and we will provide you with a plan where you get the best benefit from the medications you need for your breathing   .

## 2013-07-08 NOTE — Progress Notes (Signed)
Subjective:     Patient ID: [name], male   DOB: Sep 21, 1940 MRN: [id number]   Brief patient profile:  35 yowm active smoker [place] vet with ptsd who lives alone referred by [name] at [facility] to pulmonary clinic 05/10/2013 for copd/ already on 02 since around 2011 proved to have GOLD II  COPDwith reversibility 07/08/13   Last admit to [facility] July 2014 for cp not copd   History of Present Illness  05/10/2013 1st [facility] office visit/ [name] cc doe x 50 ft even on 02, extremely limited insight into meds/ care brings large box of pills that include ACEi but not sure what he's really taking. Symptoms indollent in onset and  slowly progressive x 5 years doe with chronic am cough prod thick white mucust but apparently to tendency to aecopd. Says nothing ever helps his breathing, not willing to consider quitting smoking rec For breathing Plan A = automatic = symbicort 160 and spiriva daily no matter what ( symbicort is 2 puffs every 12hours where spiriva is just one capsule each am) Plan B = backup = Albuterol (proventil) 2 puffs every 4 hours  Pan C = Nebulizer Albuterol (not ipatropium) up to every 4 hours if albuterol puffer not working Stop lisinopril and check with [name] for a substitute ( since you say you are not taking it anyway it should be fine to leave it off completely until your visit) The key is to stop smoking completely before smoking completely stops you!    07/08/2013 f/u ov/[name] re: GOLD II COPD Chief Complaint  Patient presents with  . Follow-up    PFT done today.  SOB w/ exertion    Not following instructions re maint vs prns - using neb first thing in am (or a cigarette) but not symbicort as instructed.  Some am cough, min mucoid sputum.   No obvious day to day or daytime variabilty or assoc or chest tightness, subjective wheeze overt sinus or hb symptoms. No unusual exp hx or h/o childhood pna/ asthma or knowledge of premature birth.  Sleeping ok without  nocturnal  or early am exacerbation  of respiratory  c/o's or need for noct saba. Also denies any obvious fluctuation of symptoms with weather or environmental changes or other aggravating or alleviating factors except as outlined above   Current Medications, Allergies, Complete Past Medical History, Past Surgical History, Family History, and Social History were reviewed in [employer] record.  ROS  The following are not active complaints unless bolded sore throat, dysphagia, dental problems, itching, sneezing,  nasal congestion or excess/ purulent secretions, ear ache,   fever, chills, sweats, unintended wt loss, pleuritic or exertional cp, hemoptysis,  orthopnea pnd or leg swelling, presyncope, palpitations, heartburn, abdominal pain, anorexia, nausea, vomiting, diarrhea  or change in bowel or urinary habits, change in stools or urine, dysuria,hematuria,  rash, arthralgias, visual complaints, headache, numbness weakness or ataxia or problems with walking or coordination,  change in mood/affect or memory.                Objective:   Physical Exam    chronically ill amb wm nad  Wt Readings from Last 3 Encounters:  07/08/13 153 lb (69.4 kg)  06/03/13 150 lb (68.04 kg)  05/16/13 157 lb 13.6 oz (71.6 kg)        HEENT mild turbinate edema.  Oropharynx no thrush or excess pnd or cobblestoning.  No JVD or cervical adenopathy. Mild accessory muscle hypertrophy. Trachea  midline, nl thryroid. Chest was hyperinflated by percussion with diminished breath sounds and moderate increased exp time without wheeze. Hoover sign positive at mid inspiration. Regular rate and rhythm without murmur gallop or rub or increase P2 or edema.  Abd: no hsm, nl excursion. Ext warm without cyanosis or clubbing.    CXR 03/17/13 Emphysema without acute finding.   Assessment:

## 2013-07-08 NOTE — Progress Notes (Signed)
PFT done today. 

## 2013-07-10 NOTE — Assessment & Plan Note (Signed)
-   on 02 2lpm 24/7 since 2011 - 07/08/2013  Walked 4lpm 2 laps @ 185 ft each stopped due to 96% and stopped due to fatigue  Rx as of 07/08/13 = 2 lpm at rest/ sleeping and 4lpm with ex

## 2013-07-10 NOTE — Assessment & Plan Note (Signed)
>   3 min  I reviewed the Flethcher curve with patient that basically indicates  if you quit smoking when your best day FEV1 is still well preserved ( as his is) it is highly unlikely you will progress to severe disease and informed the patient there was no medication on the market that has proven to change the curve or the likelihood of progression.  Therefore stopping smoking and maintaining abstinence is the most important aspect of care, not choice of inhalers or for that matter, doctors.   

## 2013-07-10 NOTE — Assessment & Plan Note (Signed)
D/c acei 05/10/13 (not taking regularly anyway)  Adequate control off acei, reviewed  Need to avoid ACEi given difficulty interpreting symptoms when acei on board.

## 2013-07-10 NOTE — Assessment & Plan Note (Signed)
-   hfa 75% p coaching 05/10/13  And 07/08/13  - PFT's 07/08/2013 FEV1  1.17 (52%) which is 29% better p B2 and DLCO 37 corrects to 75  DDX of  difficult airways managment all start with A and  include Adherence, Ace Inhibitors, Acid Reflux, Active Sinus Disease, Alpha 1 Antitripsin deficiency, Anxiety masquerading as Airways dz,  ABPA,  allergy(esp in young), Aspiration (esp in elderly), Adverse effects of DPI,  Active smokers, plus two Bs  = Bronchiectasis and Beta blocker use..and one C= CHF  Adherence is always the initial "prime suspect" and is a multilayered concern that requires a "trust but verify" approach in every patient - starting with knowing how to use medications, especially inhalers, correctly, keeping up with refills and understanding the fundamental difference between maintenance and prns vs those medications only taken for a very short course and then stopped and not refilled.  -The proper method of use, as well as anticipated side effects, of a metered-dose inhaler are discussed and demonstrated to the patient. Improved effectiveness after extensive coaching during this visit to a level of approximately  75% so continue symbiocort 160  ? acei > off  Active smoking > discussed separately

## 2013-07-13 ENCOUNTER — Encounter: Payer: Self-pay | Admitting: *Deleted

## 2013-07-13 ENCOUNTER — Encounter: Payer: Self-pay | Admitting: Cardiology

## 2013-07-13 ENCOUNTER — Ambulatory Visit (INDEPENDENT_AMBULATORY_CARE_PROVIDER_SITE_OTHER): Payer: Medicare Other | Admitting: Cardiology

## 2013-07-13 VITALS — BP 128/69 | HR 96 | Ht 63.0 in | Wt 154.0 lb

## 2013-07-13 DIAGNOSIS — I480 Paroxysmal atrial fibrillation: Secondary | ICD-10-CM

## 2013-07-13 DIAGNOSIS — I4891 Unspecified atrial fibrillation: Secondary | ICD-10-CM

## 2013-07-13 NOTE — Progress Notes (Signed)
Clinical Summary [name] is a 72 y.o.male seen today for follow up. This is a focused visit on his palpitations and paroxysmal afib, for more detailed notes regarding his many medical problems refer to my previous clinic notes.    1. Paroxysmal afib - not on anticoag due to poor compliance  - last visit reported episodes of palpitations, 2-3 episodes a week.  - Feeling of heart racing, with increased SOB and numbness in chest and left arm. + dizziness. Lasts 10-15 minutes.  -no abnormal rhythms noted on event monitor - last visit had intended to start on diltiazem but he is unsure if he started or not. We contacted the pharmacy and appears he never started the new medication - he reports his palpitations are better, with no recent episodes.   Past Medical History  Diagnosis Date  . Essential hypertension, benign   . Alcohol abuse   . PTSD (post-traumatic stress disorder)   . Chronic back pain   . Depression   . Peripheral neuropathy   . DJD (degenerative joint disease)   . COPD (chronic obstructive pulmonary disease)   . Anemia   . Myocardial infarction   . Type II or unspecified type diabetes mellitus without mention of complication, not stated as uncontrolled   . Esophageal dysmotility 07/04/2011  . Chronic respiratory failure with hypoxia   . Anxiety   . Paroxysmal atrial fibrillation 08/29/2012  . Partial small bowel obstruction 09/02/2012  . Bilateral lower extremity edema 09/03/2012    Possibly secondary to calcium channel blocker.     Allergies  Allergen Reactions  . Aspirin Other (See Comments)    Stomach upset     Current Outpatient Prescriptions  Medication Sig Dispense Refill  . acetaminophen (TYLENOL) 500 MG tablet Take 1,000 mg by mouth every 4 (four) hours as needed for pain.      [name] albuterol (PROVENTIL HFA;VENTOLIN HFA) 108 (90 BASE) MCG/ACT inhaler Inhale 2 puffs into the lungs every 6 (six) hours as needed for wheezing.      [name] albuterol  (PROVENTIL) (2.5 MG/3ML) 0.083% nebulizer solution Take 2.5 mg by nebulization every 6 (six) hours as needed for wheezing or shortness of breath.      . ALPRAZolam (XANAX) 0.25 MG tablet Take 1 tablet by mouth 3 (three) times daily.      . benzonatate (TESSALON PERLES) 100 MG capsule Take 200 mg by mouth 3 (three) times daily as needed for cough.      . budesonide-formoterol (SYMBICORT) 160-4.5 MCG/ACT inhaler Inhale 2 puffs into the lungs 2 (two) times daily.  1 Inhaler  2  . diltiazem (CARDIZEM) 30 MG tablet Take 1 tablet (30 mg total) by mouth 2 (two) times daily.  60 tablet  6  . ergocalciferol (VITAMIN D2) 50000 UNITS capsule Take 50,000 Units by mouth once a week.      . furosemide (LASIX) 20 MG tablet Take 20 mg by mouth daily. For swelling in your feet and legs. Take potassium supplement with this medicine.      . loperamide (IMODIUM A-D) 2 MG tablet Take 2 mg by mouth 4 (four) times daily as needed for diarrhea or loose stools.      . metFORMIN (GLUCOPHAGE) 1000 MG tablet Take 1,000 mg by mouth 2 (two) times daily.      . OXYGEN-HELIUM IN Inhale 2-3 L into the lungs continuous.      . pantoprazole (PROTONIX) 40 MG tablet Take 1 tablet by mouth daily.      [name]  PARoxetine (PAXIL) 20 MG tablet Take 1 tablet by mouth daily.      . potassium chloride SA (K-DUR,KLOR-CON) 20 MEQ tablet Take 1 tablet by mouth daily.      . Tamsulosin HCl (FLOMAX) 0.4 MG CAPS Take 1 capsule (0.4 mg total) by mouth daily after supper.  30 capsule  0  . tiotropium (SPIRIVA) 18 MCG inhalation capsule Place 18 mcg into inhaler and inhale daily.      . traZODone (DESYREL) 50 MG tablet Take 0.5 tablets by mouth daily.      Terrance Mass Salicylate (ASPERCREME EX) Apply 1 application topically as directed.       No current facility-administered medications for this visit.     Past Surgical History  Procedure Laterality Date  . Hernia repair      X 3  . Spinal surgeries      X 4  . Appendectomy    . Prostate surgery        Allergies  Allergen Reactions  . Aspirin Other (See Comments)    Stomach upset      Family History  Problem Relation Age of Onset  . Lung disease Mother   . Prostate cancer Father      Social History Mr. Guidice reports that he has been smoking Cigarettes.  He has a 27.5 pack-year smoking history. He has never used smokeless tobacco. Mr. Krotzer reports that he drinks alcohol.   Review of Systems CONSTITUTIONAL: No weight loss, fever, chills, weakness or fatigue.  HEENT: Eyes: No visual loss, blurred vision, double vision or yellow sclerae.No hearing loss, sneezing, congestion, runny nose or sore throat.  SKIN: No rash or itching.  CARDIOVASCULAR: per HPI RESPIRATORY: per HPI GASTROINTESTINAL: No anorexia, nausea, vomiting or diarrhea. No abdominal pain or blood.  GENITOURINARY: No burning on urination, no polyuria NEUROLOGICAL: No headache, dizziness, syncope, paralysis, ataxia, numbness or tingling in the extremities. No change in bowel or bladder control.  MUSCULOSKELETAL: No muscle, back pain, joint pain or stiffness.  LYMPHATICS: No enlarged nodes. No history of splenectomy.  PSYCHIATRIC: No history of depression or anxiety.  ENDOCRINOLOGIC: No reports of sweating, cold or heat intolerance. No polyuria or polydipsia.  [name]   Physical Examination p 96 bp 128/69 Wt 154 lbs BMI 27 Gen: resting comfortably, no acute distress HEENT: no scleral icterus, pupils equal round and reactive, no palptable cervical adenopathy,  CV: RRR, no m/r/g,no JVD, no carotid bruits Resp: Clear to auscultation bilaterally GI: abdomen is soft, non-tender, non-distended, normal bowel sounds, no hepatosplenomegaly MSK: extremities are warm, no edema.  Skin: warm, no rash Neuro:  no focal deficits Psych: appropriate affect   Diagnostic Studies Jan 2014 Echo: LVEF 55-60%, no WMAs. Mild LAE, mild RAE   06/2013 Event monitor No significant arrhythmias  Assessment and Plan   1.  Palpitations  - history of parox afib per notes, not on anticoag because of long history of non-compliance  - not currently on any rate controlling agents, tried to start diltiazem last visit but he did not fill the prescription. Reports his symptoms are better now without medications - continue to follow clinically         [name], M.D., F.A.C.C.

## 2013-07-13 NOTE — Patient Instructions (Signed)
   Remain off of the Diltiazem Continue all other medications.   Your physician wants you to follow up in: 6 months.  You will receive a reminder letter in the mail one-two months in advance.  If you don't receive a letter, please call our office to schedule the follow up appointment

## 2013-08-02 ENCOUNTER — Encounter: Payer: Self-pay | Admitting: Internal Medicine

## 2013-08-18 ENCOUNTER — Other Ambulatory Visit (INDEPENDENT_AMBULATORY_CARE_PROVIDER_SITE_OTHER): Payer: Self-pay | Admitting: *Deleted

## 2013-08-18 ENCOUNTER — Encounter (INDEPENDENT_AMBULATORY_CARE_PROVIDER_SITE_OTHER): Payer: Self-pay | Admitting: Internal Medicine

## 2013-08-18 ENCOUNTER — Ambulatory Visit (INDEPENDENT_AMBULATORY_CARE_PROVIDER_SITE_OTHER): Payer: Medicare Other | Admitting: Internal Medicine

## 2013-08-18 ENCOUNTER — Telehealth (INDEPENDENT_AMBULATORY_CARE_PROVIDER_SITE_OTHER): Payer: Self-pay | Admitting: *Deleted

## 2013-08-18 VITALS — BP 142/70 | HR 120 | Temp 97.9°F | Ht 64.0 in | Wt 149.9 lb

## 2013-08-18 DIAGNOSIS — D649 Anemia, unspecified: Secondary | ICD-10-CM

## 2013-08-18 DIAGNOSIS — K921 Melena: Secondary | ICD-10-CM

## 2013-08-18 DIAGNOSIS — R195 Other fecal abnormalities: Secondary | ICD-10-CM

## 2013-08-18 DIAGNOSIS — Z1211 Encounter for screening for malignant neoplasm of colon: Secondary | ICD-10-CM

## 2013-08-18 MED ORDER — PEG 3350-KCL-NA BICARB-NACL 420 G PO SOLR: 4000.0000 mL | Freq: Once | ORAL | Status: DC

## 2013-08-18 NOTE — Telephone Encounter (Signed)
Patient needs trilyte 

## 2013-08-18 NOTE — Progress Notes (Signed)
Subjective:     Patient ID: [name], male   DOB: 05/11/1941, 73 y.o.   MRN: [id number]  HPI Referred to our office by [facility] [place] for anemia. From his past labs, he has been anemic for greater than 6 yrs. He says sometimes when he wipes after he has a BM he will see blood. 08/08/2012 Ferritin 2, Folate 12.3, Vitamin B12 217, Iron 116, TIBC 355, Sat 33, UIBC 339. Appetite is good. No weight loss. He sometimes will choke. Patient is edentulous. He denies abdominal pain. He usually has a BM daily. He will see blood a couple times a week. BM are brown in color.  Denies acid reflux. Diabetic x 4 yrs. Hypertension for years. Hx of atrial fib. Last colonoscopy greater than 10 yrs in [place] in [place]? He on oxygen 24 hrs a day.    CBC    Component Value Date/Time   WBC 13.4* 05/17/2013 0525   RBC 3.16* 05/17/2013 0525   RBC 2.97* 08/18/2012 0434   HGB 9.5* 05/17/2013 0525   HCT 28.7* 05/17/2013 0525   PLT 209 05/17/2013 0525   MCV 90.8 05/17/2013 0525   MCH 30.1 05/17/2013 0525   MCHC 33.1 05/17/2013 0525   RDW 14.9 05/17/2013 0525   LYMPHSABS 2.1 05/16/2013 1155   MONOABS 0.8 05/16/2013 1155   EOSABS 0.3 05/16/2013 1155   BASOSABS 0.0 05/16/2013 1155     Review of Systems see hpi Current Outpatient Prescriptions  Medication Sig Dispense Refill  . acetaminophen (TYLENOL) 500 MG tablet Take 1,000 mg by mouth every 4 (four) hours as needed for pain.      [name]. albuterol (PROVENTIL HFA;VENTOLIN HFA) 108 (90 BASE) MCG/ACT inhaler Inhale 2 puffs into the lungs every 6 (six) hours as needed for wheezing.      [name]. albuterol (PROVENTIL) (2.5 MG/3ML) 0.083% nebulizer solution Take 2.5 mg by nebulization every 6 (six) hours as needed for wheezing or shortness of breath.      . ALPRAZolam (XANAX) 0.25 MG tablet Take 1 tablet by mouth 3 (three) times daily.      . benzonatate (TESSALON PERLES) 100 MG capsule Take 200 mg by mouth 3 (three) times daily as needed for cough.       . budesonide-formoterol (SYMBICORT) 160-4.5 MCG/ACT inhaler Inhale 2 puffs into the lungs 2 (two) times daily.  1 Inhaler  2  . ergocalciferol (VITAMIN D2) 50000 UNITS capsule Take 50,000 Units by mouth once a week.      . furosemide (LASIX) 20 MG tablet Take 20 mg by mouth daily. For swelling in your feet and legs. Take potassium supplement with this medicine.      . loperamide (IMODIUM A-D) 2 MG tablet Take 2 mg by mouth 4 (four) times daily as needed for diarrhea or loose stools.      . metFORMIN (GLUCOPHAGE) 1000 MG tablet Take 1,000 mg by mouth 2 (two) times daily.      . OXYGEN-HELIUM IN Inhale 2-3 L into the lungs continuous.      . pantoprazole (PROTONIX) 40 MG tablet Take 1 tablet by mouth daily.      [name]. PARoxetine (PAXIL) 20 MG tablet Take 1 tablet by mouth daily.      . potassium chloride SA (K-DUR,KLOR-CON) 20 MEQ tablet Take 1 tablet by mouth daily.      . Tamsulosin HCl (FLOMAX) 0.4 MG CAPS Take 1 capsule (0.4 mg total) by mouth daily after supper.  30 capsule  0  .  tiotropium (SPIRIVA) 18 MCG inhalation capsule Place 18 mcg into inhaler and inhale daily.      . traZODone (DESYREL) 50 MG tablet Take 0.5 tablets by mouth daily.      Terrance Mass Salicylate (ASPERCREME EX) Apply 1 application topically as directed.       No current facility-administered medications for this visit.   Past Medical History  Diagnosis Date  . Essential hypertension, benign   . Alcohol abuse   . PTSD (post-traumatic stress disorder)   . Chronic back pain   . Depression   . Peripheral neuropathy   . DJD (degenerative joint disease)   . COPD (chronic obstructive pulmonary disease)   . Anemia   . Myocardial infarction   . Type II or unspecified type diabetes mellitus without mention of complication, not stated as uncontrolled   . Esophageal dysmotility 07/04/2011  . Chronic respiratory failure with hypoxia   . Anxiety   . Paroxysmal atrial fibrillation 08/29/2012  . Partial small bowel obstruction  09/02/2012  . Bilateral lower extremity edema 09/03/2012    Possibly secondary to calcium channel blocker.   Allergies  Allergen Reactions  . Aspirin Other (See Comments)    Stomach upset        Objective:   Physical Exam  Filed Vitals:   08/18/13 1440  BP: 142/70  Pulse: 120  Temp: 97.9 F (36.6 C)  Height: 5\' 4"  (1.626 m)  Weight: 149 lb 14.4 oz (67.994 kg)   Alert and oriented. Skin warm and dry. Oral mucosa is moist.   . Sclera anicteric, conjunctivae is pink. Thyroid not enlarged. No cervical lymphadenopathy. Bilateral wheezes. Heart rate rapid. Abdomen is soft. Obese. Bowel sounds are positive. egaly. No abdominal masses felt. No tenderness.  No edema to lower extremities.  Stool brown and guaiac positive.    Assessment:    Anemia. Hx of same. Iron studies have been normal. Guaiac positive stool today. Stool brown. Colonic neoplasm needs to be ruled out.     Plan:    Colonoscopy with Dr. Karilyn Cota.

## 2013-08-18 NOTE — Patient Instructions (Signed)
Colonoscopy with Dr. Rehman 

## 2013-09-07 ENCOUNTER — Ambulatory Visit (HOSPITAL_COMMUNITY): Admission: RE | Admit: 2013-09-07 | Payer: Medicare Other | Source: Ambulatory Visit | Admitting: Internal Medicine

## 2013-09-07 ENCOUNTER — Encounter (HOSPITAL_COMMUNITY): Admission: RE | Payer: Self-pay | Source: Ambulatory Visit

## 2013-09-07 SURGERY — COLONOSCOPY
Anesthesia: Moderate Sedation

## 2013-10-08 ENCOUNTER — Inpatient Hospital Stay (HOSPITAL_COMMUNITY)
Admission: EM | Admit: 2013-10-08 | Discharge: 2013-10-10 | DRG: 191 | Disposition: A | Discharge status: Home / Self Care | Payer: Medicare Other | Attending: Family Medicine | Admitting: Family Medicine

## 2013-10-08 ENCOUNTER — Emergency Department (HOSPITAL_COMMUNITY): Payer: Medicare Other

## 2013-10-08 ENCOUNTER — Encounter (HOSPITAL_COMMUNITY): Payer: Self-pay | Admitting: Emergency Medicine

## 2013-10-08 DIAGNOSIS — Z9119 Patient's noncompliance with other medical treatment and regimen: Secondary | ICD-10-CM

## 2013-10-08 DIAGNOSIS — F17200 Nicotine dependence, unspecified, uncomplicated: Secondary | ICD-10-CM | POA: Diagnosis present

## 2013-10-08 DIAGNOSIS — Z91199 Patient's noncompliance with other medical treatment and regimen due to unspecified reason: Secondary | ICD-10-CM

## 2013-10-08 DIAGNOSIS — I480 Paroxysmal atrial fibrillation: Secondary | ICD-10-CM | POA: Diagnosis present

## 2013-10-08 DIAGNOSIS — R0902 Hypoxemia: Secondary | ICD-10-CM

## 2013-10-08 DIAGNOSIS — E119 Type 2 diabetes mellitus without complications: Secondary | ICD-10-CM

## 2013-10-08 DIAGNOSIS — F411 Generalized anxiety disorder: Secondary | ICD-10-CM | POA: Diagnosis present

## 2013-10-08 DIAGNOSIS — I252 Old myocardial infarction: Secondary | ICD-10-CM

## 2013-10-08 DIAGNOSIS — R079 Chest pain, unspecified: Secondary | ICD-10-CM

## 2013-10-08 DIAGNOSIS — J9610 Chronic respiratory failure, unspecified whether with hypoxia or hypercapnia: Secondary | ICD-10-CM

## 2013-10-08 DIAGNOSIS — Z8042 Family history of malignant neoplasm of prostate: Secondary | ICD-10-CM

## 2013-10-08 DIAGNOSIS — D649 Anemia, unspecified: Secondary | ICD-10-CM | POA: Diagnosis present

## 2013-10-08 DIAGNOSIS — I10 Essential (primary) hypertension: Secondary | ICD-10-CM | POA: Diagnosis present

## 2013-10-08 DIAGNOSIS — Z79899 Other long term (current) drug therapy: Secondary | ICD-10-CM

## 2013-10-08 DIAGNOSIS — I509 Heart failure, unspecified: Secondary | ICD-10-CM | POA: Diagnosis present

## 2013-10-08 DIAGNOSIS — F4310 Post-traumatic stress disorder, unspecified: Secondary | ICD-10-CM | POA: Diagnosis present

## 2013-10-08 DIAGNOSIS — G609 Hereditary and idiopathic neuropathy, unspecified: Secondary | ICD-10-CM | POA: Diagnosis present

## 2013-10-08 DIAGNOSIS — J441 Chronic obstructive pulmonary disease with (acute) exacerbation: Principal | ICD-10-CM

## 2013-10-08 DIAGNOSIS — F1020 Alcohol dependence, uncomplicated: Secondary | ICD-10-CM | POA: Diagnosis present

## 2013-10-08 DIAGNOSIS — J9611 Chronic respiratory failure with hypoxia: Secondary | ICD-10-CM

## 2013-10-08 DIAGNOSIS — D72829 Elevated white blood cell count, unspecified: Secondary | ICD-10-CM | POA: Diagnosis present

## 2013-10-08 DIAGNOSIS — M1990 Unspecified osteoarthritis, unspecified site: Secondary | ICD-10-CM | POA: Diagnosis present

## 2013-10-08 DIAGNOSIS — J449 Chronic obstructive pulmonary disease, unspecified: Secondary | ICD-10-CM | POA: Diagnosis present

## 2013-10-08 DIAGNOSIS — F3289 Other specified depressive episodes: Secondary | ICD-10-CM | POA: Diagnosis present

## 2013-10-08 DIAGNOSIS — I4891 Unspecified atrial fibrillation: Secondary | ICD-10-CM | POA: Diagnosis present

## 2013-10-08 DIAGNOSIS — Z9981 Dependence on supplemental oxygen: Secondary | ICD-10-CM

## 2013-10-08 DIAGNOSIS — F329 Major depressive disorder, single episode, unspecified: Secondary | ICD-10-CM | POA: Diagnosis present

## 2013-10-08 HISTORY — DX: Alcohol dependence, uncomplicated: F10.20

## 2013-10-08 HISTORY — DX: Pneumonia, unspecified organism: J18.9

## 2013-10-08 LAB — CBC WITH DIFFERENTIAL/PLATELET
Basophils Absolute: 0.1 10*3/uL (ref 0.0–0.1)
Basophils Relative: 0 % (ref 0–1)
EOS ABS: 0.1 10*3/uL (ref 0.0–0.7)
Eosinophils Relative: 1 % (ref 0–5)
HCT: 33 % — ABNORMAL LOW (ref 39.0–52.0)
Hemoglobin: 11.1 g/dL — ABNORMAL LOW (ref 13.0–17.0)
Lymphocytes Relative: 22 % (ref 12–46)
Lymphs Abs: 2.9 10*3/uL (ref 0.7–4.0)
MCH: 30.7 pg (ref 26.0–34.0)
MCHC: 33.6 g/dL (ref 30.0–36.0)
MCV: 91.4 fL (ref 78.0–100.0)
MONOS PCT: 6 % (ref 3–12)
Monocytes Absolute: 0.8 10*3/uL (ref 0.1–1.0)
NEUTROS PCT: 72 % (ref 43–77)
Neutro Abs: 9.7 10*3/uL — ABNORMAL HIGH (ref 1.7–7.7)
Platelets: 281 10*3/uL (ref 150–400)
RBC: 3.61 MIL/uL — ABNORMAL LOW (ref 4.22–5.81)
RDW: 14.2 % (ref 11.5–15.5)
WBC: 13.5 10*3/uL — ABNORMAL HIGH (ref 4.0–10.5)

## 2013-10-08 LAB — BASIC METABOLIC PANEL
BUN: 9 mg/dL (ref 6–23)
CO2: 24 mEq/L (ref 19–32)
Calcium: 9 mg/dL (ref 8.4–10.5)
Chloride: 95 mEq/L — ABNORMAL LOW (ref 96–112)
Creatinine, Ser: 0.97 mg/dL (ref 0.50–1.35)
GFR calc Af Amer: >90 mL/min (ref >90)
GFR, EST NON AFRICAN AMERICAN: 81 mL/min — AB (ref >90)
Glucose, Bld: 99 mg/dL (ref 70–99)
Potassium: 3.7 mEq/L (ref 3.7–5.3)
SODIUM: 136 meq/L — AB (ref 137–147)

## 2013-10-08 LAB — PRO B NATRIURETIC PEPTIDE: Pro B Natriuretic peptide (BNP): 251.5 pg/mL — ABNORMAL HIGH (ref 0–125)

## 2013-10-08 LAB — GLUCOSE, CAPILLARY: Glucose-Capillary: 203 mg/dL — ABNORMAL HIGH (ref 70–99)

## 2013-10-08 LAB — TROPONIN I
Troponin I: <0.3 ng/mL (ref <0.30)
Troponin I: <0.3 ng/mL (ref <0.30)

## 2013-10-08 MED ORDER — IPRATROPIUM-ALBUTEROL 0.5-2.5 (3) MG/3ML IN SOLN
3.0000 mL | Freq: Once | RESPIRATORY_TRACT | Status: AC
  Administered 2013-10-08: 3 mL via RESPIRATORY_TRACT
  Filled 2013-10-08: qty 3

## 2013-10-08 MED ORDER — PAROXETINE HCL 20 MG PO TABS
20.0000 mg | ORAL_TABLET | Freq: Every day | ORAL | Status: DC
  Administered 2013-10-08 – 2013-10-10 (×3): 20 mg via ORAL
  Filled 2013-10-08 (×3): qty 1

## 2013-10-08 MED ORDER — ADULT MULTIVITAMIN W/MINERALS CH
1.0000 | ORAL_TABLET | Freq: Every day | ORAL | Status: DC
  Administered 2013-10-08 – 2013-10-10 (×3): 1 via ORAL
  Filled 2013-10-08 (×3): qty 1

## 2013-10-08 MED ORDER — SODIUM CHLORIDE 0.9 % IJ SOLN: 3.0000 mL | INTRAMUSCULAR | Status: DC | PRN

## 2013-10-08 MED ORDER — METHYLPREDNISOLONE SODIUM SUCC 40 MG IJ SOLR
40.0000 mg | Freq: Two times a day (BID) | INTRAMUSCULAR | Status: DC
  Administered 2013-10-08 – 2013-10-09 (×2): 40 mg via INTRAVENOUS
  Filled 2013-10-08 (×2): qty 1

## 2013-10-08 MED ORDER — IPRATROPIUM-ALBUTEROL 0.5-2.5 (3) MG/3ML IN SOLN
3.0000 mL | RESPIRATORY_TRACT | Status: DC
  Administered 2013-10-08: 3 mL via RESPIRATORY_TRACT
  Filled 2013-10-08: qty 3

## 2013-10-08 MED ORDER — TRAZODONE HCL 50 MG PO TABS
25.0000 mg | ORAL_TABLET | Freq: Every day | ORAL | Status: DC
  Administered 2013-10-08 – 2013-10-09 (×2): 25 mg via ORAL
  Filled 2013-10-08 (×2): qty 1

## 2013-10-08 MED ORDER — METHYLPREDNISOLONE SODIUM SUCC 125 MG IJ SOLR
80.0000 mg | Freq: Once | INTRAMUSCULAR | Status: AC
  Administered 2013-10-08: 80 mg via INTRAVENOUS
  Filled 2013-10-08: qty 2

## 2013-10-08 MED ORDER — ONDANSETRON HCL 4 MG/2ML IJ SOLN: 4.0000 mg | Freq: Four times a day (QID) | INTRAMUSCULAR | Status: DC | PRN

## 2013-10-08 MED ORDER — OXYCODONE-ACETAMINOPHEN 5-325 MG PO TABS
1.0000 | ORAL_TABLET | ORAL | Status: DC | PRN
  Administered 2013-10-08 – 2013-10-10 (×4): 1 via ORAL
  Filled 2013-10-08 (×4): qty 1

## 2013-10-08 MED ORDER — PANTOPRAZOLE SODIUM 40 MG PO TBEC
40.0000 mg | DELAYED_RELEASE_TABLET | Freq: Every day | ORAL | Status: DC
  Administered 2013-10-08 – 2013-10-10 (×3): 40 mg via ORAL
  Filled 2013-10-08 (×3): qty 1

## 2013-10-08 MED ORDER — SODIUM CHLORIDE 0.9 % IV SOLN: 250.0000 mL | INTRAVENOUS | Status: DC | PRN

## 2013-10-08 MED ORDER — LEVOFLOXACIN 500 MG PO TABS
500.0000 mg | ORAL_TABLET | Freq: Once | ORAL | Status: AC
  Administered 2013-10-08: 500 mg via ORAL
  Filled 2013-10-08: qty 1

## 2013-10-08 MED ORDER — THIAMINE HCL 100 MG/ML IJ SOLN: 100.0000 mg | Freq: Every day | INTRAMUSCULAR | Status: DC

## 2013-10-08 MED ORDER — INSULIN ASPART 100 UNIT/ML ~~LOC~~ SOLN
0.0000 [IU] | Freq: Three times a day (TID) | SUBCUTANEOUS | Status: DC
  Administered 2013-10-09: 2 [IU] via SUBCUTANEOUS
  Administered 2013-10-09: 3 [IU] via SUBCUTANEOUS
  Administered 2013-10-09: 5 [IU] via SUBCUTANEOUS
  Administered 2013-10-10: 1 [IU] via SUBCUTANEOUS

## 2013-10-08 MED ORDER — ACETAMINOPHEN 325 MG PO TABS: 650.0000 mg | ORAL_TABLET | Freq: Four times a day (QID) | ORAL | Status: DC | PRN

## 2013-10-08 MED ORDER — ENOXAPARIN SODIUM 40 MG/0.4ML ~~LOC~~ SOLN
40.0000 mg | SUBCUTANEOUS | Status: DC
  Administered 2013-10-08 – 2013-10-09 (×2): 40 mg via SUBCUTANEOUS
  Filled 2013-10-08 (×2): qty 0.4

## 2013-10-08 MED ORDER — IPRATROPIUM-ALBUTEROL 0.5-2.5 (3) MG/3ML IN SOLN
3.0000 mL | Freq: Four times a day (QID) | RESPIRATORY_TRACT | Status: DC
  Administered 2013-10-09 – 2013-10-10 (×5): 3 mL via RESPIRATORY_TRACT
  Filled 2013-10-08 (×5): qty 3

## 2013-10-08 MED ORDER — ALBUTEROL SULFATE (2.5 MG/3ML) 0.083% IN NEBU
2.5000 mg | INHALATION_SOLUTION | RESPIRATORY_TRACT | Status: DC | PRN
Start: 2013-10-08 — End: 2013-10-10
  Filled 2013-10-08: qty 3

## 2013-10-08 MED ORDER — ONDANSETRON HCL 4 MG PO TABS: 4.0000 mg | ORAL_TABLET | Freq: Four times a day (QID) | ORAL | Status: DC | PRN

## 2013-10-08 MED ORDER — VITAMIN B-1 100 MG PO TABS
100.0000 mg | ORAL_TABLET | Freq: Every day | ORAL | Status: DC
Start: 2013-10-08 — End: 2013-10-10
  Administered 2013-10-08 – 2013-10-10 (×3): 100 mg via ORAL
  Filled 2013-10-08 (×3): qty 1

## 2013-10-08 MED ORDER — LORAZEPAM 2 MG/ML IJ SOLN: 1.0000 mg | Freq: Four times a day (QID) | INTRAMUSCULAR | Status: DC | PRN

## 2013-10-08 MED ORDER — TAMSULOSIN HCL 0.4 MG PO CAPS
0.4000 mg | ORAL_CAPSULE | Freq: Every day | ORAL | Status: DC
  Administered 2013-10-08 – 2013-10-09 (×2): 0.4 mg via ORAL
  Filled 2013-10-08 (×2): qty 1

## 2013-10-08 MED ORDER — ACETAMINOPHEN 650 MG RE SUPP: 650.0000 mg | Freq: Four times a day (QID) | RECTAL | Status: DC | PRN

## 2013-10-08 MED ORDER — ASPIRIN 81 MG PO CHEW
324.0000 mg | CHEWABLE_TABLET | Freq: Once | ORAL | Status: AC
  Administered 2013-10-08: 324 mg via ORAL
  Filled 2013-10-08: qty 4

## 2013-10-08 MED ORDER — POTASSIUM CHLORIDE CRYS ER 20 MEQ PO TBCR
20.0000 meq | EXTENDED_RELEASE_TABLET | Freq: Every day | ORAL | Status: DC
  Administered 2013-10-08 – 2013-10-10 (×3): 20 meq via ORAL
  Filled 2013-10-08 (×3): qty 1

## 2013-10-08 MED ORDER — SODIUM CHLORIDE 0.9 % IJ SOLN
3.0000 mL | Freq: Two times a day (BID) | INTRAMUSCULAR | Status: DC
  Administered 2013-10-08 – 2013-10-09 (×2): 3 mL via INTRAVENOUS

## 2013-10-08 MED ORDER — SODIUM CHLORIDE 0.9 % IJ SOLN
3.0000 mL | Freq: Two times a day (BID) | INTRAMUSCULAR | Status: DC
  Administered 2013-10-09 (×2): 3 mL via INTRAVENOUS

## 2013-10-08 MED ORDER — FOLIC ACID 1 MG PO TABS
1.0000 mg | ORAL_TABLET | Freq: Every day | ORAL | Status: DC
  Administered 2013-10-08 – 2013-10-10 (×3): 1 mg via ORAL
  Filled 2013-10-08 (×3): qty 1

## 2013-10-08 MED ORDER — LORAZEPAM 1 MG PO TABS
1.0000 mg | ORAL_TABLET | Freq: Four times a day (QID) | ORAL | Status: DC | PRN
  Administered 2013-10-09: 1 mg via ORAL
  Filled 2013-10-08 (×2): qty 1

## 2013-10-08 NOTE — H&P (Signed)
History and Physical  [name] NWG:[id number] DOB: Oct 19, 1940 DOA: 10/08/2013  Referring physician: Ross Marcus, MD in ED PCP: Josue Hector, MD  Pulmonologist: Dr. Sherene Sires Cardiologist: Dr. Wyline Mood  Chief Complaint: chest pain, SOB  HPI:  73 year old man with COPD GOLD II, chronic hypoxic respiratory failure with hypoxia, ongoing smoking presented emergency department with 6 day history of constant unremitting chest pain as well as increasing shortness of breath. Initial evaluation suggested COPD exacerbation, chest pain was felt to be atypical in patient was referred for admission.  Patient is a vague historian but reports 6 days of increasing shortness of breath as well as constant substernal chest pain without specific aggravating or alleviating factors. He does have some pain in his left arm as well. No diaphoresis but has had some chills. No pain in his jaw or neck. He also reports anxiety. He continues to drink about a sixpack of beer per week. He does also report some vomiting including today which has been going on for approximately 6 days.  He is followed by cardiology for paroxysmal atrial fibrillation and chest pain. This has been thought to be related to either COPD or GI issues in the past.  In the emergency department afebrile, vital signs stable. Chronic stable hypoxia. Basic metabolic panel unremarkable. Troponin negative. BNP unremarkable. WBC 13.5. Hemoglobin stable 11.1. Chest x-ray showed emphysema, no acute changes. EKG independently reviewed sinus rhythm, no acute changes.  Review of Systems:  Generally positive review of systems. However negative for fever, sore throat, rash, new muscle aches, dysuria, bleeding, abdominal pain.  Long-standing visual changes  Past Medical History  Diagnosis Date  . Essential hypertension, benign   . Alcoholism   . PTSD (post-traumatic stress disorder)   . Chronic back pain   . Depression   . Peripheral neuropathy     . DJD (degenerative joint disease)   . COPD (chronic obstructive pulmonary disease)   . Anemia   . Myocardial infarction   . Type II or unspecified type diabetes mellitus without mention of complication, not stated as uncontrolled   . Esophageal dysmotility 07/04/2011  . Chronic respiratory failure with hypoxia   . Anxiety   . Paroxysmal atrial fibrillation 08/29/2012  . Partial small bowel obstruction 09/02/2012  . Bilateral lower extremity edema 09/03/2012    Possibly secondary to calcium channel blocker.  . Pneumonia     Past Surgical History  Procedure Laterality Date  . Hernia repair      X 3  . Spinal surgeries      X 4  . Appendectomy    . Prostate surgery      Social History:  reports that he has been smoking Cigarettes.  He has a 27.5 pack-year smoking history. He has never used smokeless tobacco. He reports that he drinks alcohol. He reports that he does not use illicit drugs.  Allergies  Allergen Reactions  . Aspirin Other (See Comments)    Stomach upset    Family History  Problem Relation Age of Onset  . Lung disease Mother   . Prostate cancer Father      Prior to Admission medications   Medication Sig Start Date End Date Taking? Authorizing Provider  acetaminophen (TYLENOL) 500 MG tablet Take 1,000 mg by mouth every 4 (four) hours as needed for pain.   Yes Historical Provider, MD  albuterol (PROVENTIL HFA;VENTOLIN HFA) 108 (90 BASE) MCG/ACT inhaler Inhale 2 puffs into the lungs every 6 (six) hours as needed for wheezing.  Yes Historical Provider, MD  albuterol (PROVENTIL) (2.5 MG/3ML) 0.083% nebulizer solution Take 2.5 mg by nebulization every 6 (six) hours as needed for wheezing or shortness of breath.   Yes Historical Provider, MD  ALPRAZolam Prudy Feeler(XANAX) 0.25 MG tablet Take 1 tablet by mouth 3 (three) times daily. 05/06/13  Yes Historical Provider, MD  benzonatate (TESSALON PERLES) 100 MG capsule Take 200 mg by mouth 3 (three) times daily as needed for cough.    Yes Historical Provider, MD  budesonide-formoterol (SYMBICORT) 160-4.5 MCG/ACT inhaler Inhale 2 puffs into the lungs 2 (two) times daily. 09/04/12 11/17/13 Yes Elliot Cousinenise Fisher, MD  furosemide (LASIX) 20 MG tablet Take 20 mg by mouth daily. For swelling in your feet and legs. Take potassium supplement with this medicine. 09/04/12  Yes Elliot Cousinenise Fisher, MD  metFORMIN (GLUCOPHAGE) 1000 MG tablet Take 1,000 mg by mouth 2 (two) times daily.   Yes Historical Provider, MD  OXYGEN-HELIUM IN Inhale 2-3 L into the lungs continuous.   Yes Historical Provider, MD  pantoprazole (PROTONIX) 40 MG tablet Take 1 tablet by mouth daily. 05/31/13  Yes Historical Provider, MD  PARoxetine (PAXIL) 20 MG tablet Take 1 tablet by mouth daily. 05/23/13  Yes Historical Provider, MD  potassium chloride SA (K-DUR,KLOR-CON) 20 MEQ tablet Take 1 tablet by mouth daily. 05/31/13  Yes Historical Provider, MD  Tamsulosin HCl (FLOMAX) 0.4 MG CAPS Take 1 capsule (0.4 mg total) by mouth daily after supper. 08/24/12  Yes Rhetta MuraJai-Gurmukh Samtani, MD  tiotropium (SPIRIVA) 18 MCG inhalation capsule Place 18 mcg into inhaler and inhale daily.   Yes Historical Provider, MD  traZODone (DESYREL) 50 MG tablet Take 0.5 tablets by mouth daily. 05/23/13  Yes Historical Provider, MD  Trolamine Salicylate (ASPERCREME EX) Apply 1 application topically as directed.   Yes Historical Provider, MD   Physical Exam: Filed Vitals:   10/08/13 1312 10/08/13 1503 10/08/13 1527 10/08/13 1613  BP: 199/89  195/75   Pulse: 107  98   Temp: 98 F (36.7 C)  97.7 F (36.5 C)   TempSrc: Oral  Oral   Resp: 20  18   SpO2: 99% 99% 97% 100%   General:  Examined in emergency department. Appears calm and comfortable speaks in full sentences. Eyes: PERRL, normal lids, irises  ENT: grossly normal hearing, lips & tongue Neck: no LAD, masses or thyromegaly Cardiovascular: RRR, no m/r/g. No LE edema. Respiratory: Poor air movement. No frank wheezes, rales or rhonchi. Speaks in full  sentences. Respiratory effort mildly increased. Abdomen: soft, ntnd, well-healed incisions, ventral hernia soft. No hernias noted. Skin: no rash or induration seen  Musculoskeletal: grossly normal tone BUE/BLE Psychiatric: grossly normal mood and affect, speech fluent and appropriate Neurologic: grossly non-focal.  Wt Readings from Last 3 Encounters:  08/18/13 67.994 kg (149 lb 14.4 oz)  07/13/13 69.854 kg (154 lb)  07/08/13 69.4 kg (153 lb)    Labs on Admission:  Basic Metabolic Panel:  Recent Labs Lab 10/08/13 1346  NA 136*  K 3.7  CL 95*  CO2 24  GLUCOSE 99  BUN 9  CREATININE 0.97  CALCIUM 9.0   CBC:  Recent Labs Lab 10/08/13 1346  WBC 13.5*  NEUTROABS 9.7*  HGB 11.1*  HCT 33.0*  MCV 91.4  PLT 281    Cardiac Enzymes:  Recent Labs Lab 10/08/13 1346  TROPONINI <0.30     Recent Labs  01/24/13 1615 10/08/13 1346  PROBNP 110.5 251.5*    Radiological Exams on Admission: Dg Chest 2 View  10/08/2013  CLINICAL DATA:  Shortness of breath and weakness.  EXAM: CHEST  2 VIEW  COMPARISON:  DG CHEST 1V PORT dated 08/01/2013; DG CHEST 1V PORT dated 08/18/2012; CT ANGIO CHEST W/CM &/OR WO/CM dated 01/24/2013  FINDINGS: Two views demonstrate lucency in the upper lungs suggesting emphysematous changes. No focal airspace disease. Heart and mediastinum are within normal limits. The trachea is midline. The bony thorax is intact.  IMPRESSION: Emphysema.  No acute chest findings.   Electronically Signed   By: Richarda Overlie M.D.   On: 10/08/2013 14:45    EKG: Independently reviewed.    Active Problems:   COPD (chronic obstructive pulmonary disease)   Assessment/Plan 1. COPD exacerbation. 2. Chest pain.  atypical, present for 6 days. Likely related to COPD. Plan serial troponins, no further evaluation unless positive. Cardiology office note 06/03/2013 "multiple admissions for chest pain in the past, with negative EKG and cardiac enzymes. Often presents in setting of COPD  exacerbation. Has been thought to be muskuloskeletal or possible GI, he does have dysphagia and is followed at [facility] for this. 3. Chronic hypoxic respiratory failure secondary to COPD. 4. Diabetes mellitus type 2. hold metformin. Sliding scale insulin. 5. Chronic leukocytosis. Likely related to smoking. Followup as an outpatient.  6. Paroxysmal atrial fibrillation. Not on anticoagulation secondary to poor compliance, history of falls, ongoing alcohol abuse/dependence. 7. Alcohol dependence.  8. Tobacco dependence. 9. Anxiety, PTSD. Requested anxiety medication several times.   Admit for treatment of COPD. Nebulizers, oxygen, steroids.  Rule out MI with troponins.  CBC, BMP in the morning.  CIWA  Social work for substance abuse  Recommend smoking cessation  Code Status: full code  DVT prophylaxis: Lovenox Family Communication: none present Disposition Plan/Anticipated LOS: admit, 2 days  Time spent: 60 minutes  Brendia Sacks, MD  [facility] Pager [phone] 10/08/2013, 6:29 PM

## 2013-10-08 NOTE — ED Notes (Addendum)
[facility] with c/o generalized weakness, cough that is productive with different color sputum production, n/v/d, chills,  pt also reports that he has not been taking some of his medications lately due to not having them, reports that he has not felt like going to get them, has abrasions to right forearm and bruising noted to right abd area from recent fall. Dr Wilkie AyeHorton at bedside, pt Sinus tach on monitor with rate of 105

## 2013-10-08 NOTE — ED Notes (Signed)
rn has attempted to call report x2.

## 2013-10-08 NOTE — ED Provider Notes (Signed)
CSN: [id number]     Arrival date & time 10/08/13  1306 History  This chart was scribed for Brandon Baton, MD by Quintella Reichert, ED scribe.  This patient was seen in room APA10/APA10 and the patient's care was started at 1:33 PM.   Chief Complaint  Patient presents with  . Chest Pain  . Shortness of Breath    The history is provided by the patient. No language interpreter was used.    HPI Comments: [name] is a 73 y.o. male with h/o MI, CHF and COPD who presents to the Emergency Department complaining of right-sided CP with associated SOB that began several days ago.  Pt localizes pain to right mid-sternal chest and rates it at 10/10.  He states pain has been constant since onset.  He describes it as "just a hard pain."  It radiates into his left shoulder.  Pt admits to prior h/o similar symptoms in association with an MI.  Pt also complains of SOB, chills, and bilateral knee and foot pain.  He denies fever or leg swelling.  He has been using his inhalers at home with some relief of SOB.  He is normally on 4L oxygen at home and denies recent increased usage.  He did not take aspirin today.  He states that regular aspirin "makes my stomach hurt" but he is able to take baby aspirin.  Pt does not know when he last saw his cardiologist.   Past Medical History  Diagnosis Date  . Essential hypertension, benign   . Alcohol abuse   . PTSD (post-traumatic stress disorder)   . Chronic back pain   . Depression   . Peripheral neuropathy   . DJD (degenerative joint disease)   . COPD (chronic obstructive pulmonary disease)   . Anemia   . Myocardial infarction   . Type II or unspecified type diabetes mellitus without mention of complication, not stated as uncontrolled   . Esophageal dysmotility 07/04/2011  . Chronic respiratory failure with hypoxia   . Anxiety   . Paroxysmal atrial fibrillation 08/29/2012  . Partial small bowel obstruction 09/02/2012  . Bilateral lower extremity edema  09/03/2012    Possibly secondary to calcium channel blocker.  . Pneumonia     Past Surgical History  Procedure Laterality Date  . Hernia repair      X 3  . Spinal surgeries      X 4  . Appendectomy    . Prostate surgery      Family History  Problem Relation Age of Onset  . Lung disease Mother   . Prostate cancer Father     History  Substance Use Topics  . Smoking status: Current Every Day Smoker -- 0.50 packs/day for 55 years    Types: Cigarettes  . Smokeless tobacco: Never Used     Comment: 1 pack a week  . Alcohol Use: No     Comment: Patient is an alcoholic. Has not drank in a few years     Review of Systems  Constitutional: Positive for chills. Negative for fever.  Respiratory: Positive for cough, chest tightness and shortness of breath.   Cardiovascular: Positive for chest pain. Negative for leg swelling.  Gastrointestinal: Negative.  Negative for nausea, vomiting, abdominal pain and diarrhea.  Genitourinary: Negative.  Negative for dysuria.  Musculoskeletal: Negative for back pain.  Skin: Negative for rash.  Neurological: Negative for headaches.  All other systems reviewed and are negative.      Allergies  Aspirin  Home Medications   Current Outpatient Rx  Name  Route  Sig  Dispense  Refill  . acetaminophen (TYLENOL) 500 MG tablet   Oral   Take 1,000 mg by mouth every 4 (four) hours as needed for pain.         [name]. albuterol (PROVENTIL HFA;VENTOLIN HFA) 108 (90 BASE) MCG/ACT inhaler   Inhalation   Inhale 2 puffs into the lungs every 6 (six) hours as needed for wheezing.         [name]. albuterol (PROVENTIL) (2.5 MG/3ML) 0.083% nebulizer solution   Nebulization   Take 2.5 mg by nebulization every 6 (six) hours as needed for wheezing or shortness of breath.         . ALPRAZolam (XANAX) 0.25 MG tablet   Oral   Take 1 tablet by mouth 3 (three) times daily.         . benzonatate (TESSALON PERLES) 100 MG capsule   Oral   Take 200 mg by mouth 3  (three) times daily as needed for cough.         . budesonide-formoterol (SYMBICORT) 160-4.5 MCG/ACT inhaler   Inhalation   Inhale 2 puffs into the lungs 2 (two) times daily.   1 Inhaler   2   . furosemide (LASIX) 20 MG tablet   Oral   Take 20 mg by mouth daily. For swelling in your feet and legs. Take potassium supplement with this medicine.         . metFORMIN (GLUCOPHAGE) 1000 MG tablet   Oral   Take 1,000 mg by mouth 2 (two) times daily.         . OXYGEN-HELIUM IN   Inhalation   Inhale 2-3 L into the lungs continuous.         . pantoprazole (PROTONIX) 40 MG tablet   Oral   Take 1 tablet by mouth daily.         [name]. PARoxetine (PAXIL) 20 MG tablet   Oral   Take 1 tablet by mouth daily.         . potassium chloride SA (K-DUR,KLOR-CON) 20 MEQ tablet   Oral   Take 1 tablet by mouth daily.         . Tamsulosin HCl (FLOMAX) 0.4 MG CAPS   Oral   Take 1 capsule (0.4 mg total) by mouth daily after supper.   30 capsule   0   . tiotropium (SPIRIVA) 18 MCG inhalation capsule   Inhalation   Place 18 mcg into inhaler and inhale daily.         . traZODone (DESYREL) 50 MG tablet   Oral   Take 0.5 tablets by mouth daily.         Terrance Mass. Trolamine Salicylate (ASPERCREME EX)   Apply externally   Apply 1 application topically as directed.          BP 199/89  Pulse 107  Temp(Src) 98 F (36.7 C) (Oral)  Resp 20  SpO2 99%  Physical Exam  Nursing note and vitals reviewed. Constitutional: He is oriented to person, place, and time. No distress.  Chronically ill-appearing  HENT:  Head: Normocephalic and atraumatic.  Mucous membranes dry  Eyes: Pupils are equal, round, and reactive to light.  Neck: Neck supple. No JVD present.  Cardiovascular: Regular rhythm and normal heart sounds.   No murmur heard. Tachycardia  Pulmonary/Chest: Effort normal. No respiratory distress. He has wheezes. He has rales.  Mild tachypnea  Abdominal: Soft. Bowel sounds  are normal.  There is no tenderness. There is no rebound.  Musculoskeletal: He exhibits no edema.  Lymphadenopathy:    He has no cervical adenopathy.  Neurological: He is alert and oriented to person, place, and time.  Skin: Skin is warm and dry.  Psychiatric: He has a normal mood and affect.    ED Course  Procedures (including critical care time)  DIAGNOSTIC STUDIES: Oxygen Saturation is 99% on room air, normal by my interpretation.    COORDINATION OF CARE: 1:37 PM-Discussed treatment plan which includes cardiac workup with pt at bedside and pt agreed to plan.     Labs Review Labs Reviewed  CBC WITH DIFFERENTIAL - Abnormal; Notable for the following:    WBC 13.5 (*)    RBC 3.61 (*)    Hemoglobin 11.1 (*)    HCT 33.0 (*)    Neutro Abs 9.7 (*)    All other components within normal limits  BASIC METABOLIC PANEL - Abnormal; Notable for the following:    Sodium 136 (*)    Chloride 95 (*)    GFR calc non Af Amer 81 (*)    All other components within normal limits  PRO B NATRIURETIC PEPTIDE - Abnormal; Notable for the following:    Pro B Natriuretic peptide (BNP) 251.5 (*)    All other components within normal limits  TROPONIN I    Imaging Review Dg Chest 2 View  10/08/2013   CLINICAL DATA:  Shortness of breath and weakness.  EXAM: CHEST  2 VIEW  COMPARISON:  DG CHEST 1V PORT dated 08/01/2013; DG CHEST 1V PORT dated 08/18/2012; CT ANGIO CHEST W/CM &/OR WO/CM dated 01/24/2013  FINDINGS: Two views demonstrate lucency in the upper lungs suggesting emphysematous changes. No focal airspace disease. Heart and mediastinum are within normal limits. The trachea is midline. The bony thorax is intact.  IMPRESSION: Emphysema.  No acute chest findings.   Electronically Signed   By: Richarda Overlie M.D.   On: 10/08/2013 14:45     EKG Interpretation   Date/Time:  Saturday October 08 2013 13:10:44 EST Ventricular Rate:  99 PR Interval:  128 QRS Duration: 80 QT Interval:  354 QTC Calculation: 454 R Axis:    81 Text Interpretation:  Normal sinus rhythm Nonspecific ST abnormality  Abnormal ECG When compared with ECG of 17-May-2013 03:47, T wave amplitude  has decreased in Lateral leads Confirmed by HORTON  MD, Toni Amend ([id number])  on 10/08/2013 1:24:13 PM      MDM   Final diagnoses:  COPD with acute exacerbation    This is a 73 year old male with extensive history of COPD who presents with chest pain and shortness of breath. Symptoms have been ongoing for the last 2-3 days. He also reports cough. He reports a recent hospitalization for pneumonia; however, has not been hospitalized since October 2014. Notably wheezing on exam. Mildly tachypneic. Patient given Solu-Medrol and a duo neb.  Workup is significant for leukocytosis to 13.5.  Patient is mildly tachycardic. He is on his home O2 requirement.  Chest x-ray without evidence of pneumonia. Repeat examination reveals persistent wheezing and patient continuing to endorse chest pain.  Feel chest pain is likely secondary to COPD exacerbation. Will admit to the hospital for further respiratory therapy.   I personally performed the services described in this documentation, which was scribed in my presence. The recorded information has been reviewed and is accurate.    Brandon Baton, MD 10/08/13 [phone]

## 2013-10-08 NOTE — ED Notes (Signed)
Pt resting in bed, voices no complaints at present.  

## 2013-10-08 NOTE — ED Notes (Signed)
Patient brought in via EMS from home. Alert and oriented. Airway patent. Patient c/o mid-sternal chest pain that radiates into left shoulder. Per patient pain worse with deep breath and movement of left arm. Per patient shortness of breath, weakness, nausea, and vomiting. Patient states he has not eaten since Wednesday. Patient reports hx of MI, CHF, and COPD. Per patient released from here last month after being diagnosed with double pneumonia. Patient wears 3 liters of oxygen via [place] at home.

## 2013-10-09 LAB — BASIC METABOLIC PANEL
BUN: 18 mg/dL (ref 6–23)
CO2: 27 mEq/L (ref 19–32)
Calcium: 9.2 mg/dL (ref 8.4–10.5)
Chloride: 96 mEq/L (ref 96–112)
Creatinine, Ser: 1.12 mg/dL (ref 0.50–1.35)
GFR calc Af Amer: 74 mL/min — ABNORMAL LOW (ref >90)
GFR calc non Af Amer: 64 mL/min — ABNORMAL LOW (ref >90)
GLUCOSE: 226 mg/dL — AB (ref 70–99)
Potassium: 4.5 mEq/L (ref 3.7–5.3)
SODIUM: 136 meq/L — AB (ref 137–147)

## 2013-10-09 LAB — CBC
HCT: 28.8 % — ABNORMAL LOW (ref 39.0–52.0)
HEMOGLOBIN: 10 g/dL — AB (ref 13.0–17.0)
MCH: 31.5 pg (ref 26.0–34.0)
MCHC: 34.7 g/dL (ref 30.0–36.0)
MCV: 90.9 fL (ref 78.0–100.0)
Platelets: 242 10*3/uL (ref 150–400)
RBC: 3.17 MIL/uL — ABNORMAL LOW (ref 4.22–5.81)
RDW: 14.3 % (ref 11.5–15.5)
WBC: 7.2 10*3/uL (ref 4.0–10.5)

## 2013-10-09 LAB — GLUCOSE, CAPILLARY
GLUCOSE-CAPILLARY: 170 mg/dL — AB (ref 70–99)
GLUCOSE-CAPILLARY: 269 mg/dL — AB (ref 70–99)
Glucose-Capillary: 191 mg/dL — ABNORMAL HIGH (ref 70–99)
Glucose-Capillary: 106 mg/dL — ABNORMAL HIGH (ref 70–99)

## 2013-10-09 LAB — TROPONIN I: Troponin I: <0.3 ng/mL (ref <0.30)

## 2013-10-09 MED ORDER — ASPIRIN EC 81 MG PO TBEC
81.0000 mg | DELAYED_RELEASE_TABLET | Freq: Every day | ORAL | Status: DC
  Administered 2013-10-09 – 2013-10-10 (×2): 81 mg via ORAL
  Filled 2013-10-09 (×2): qty 1

## 2013-10-09 MED ORDER — LEVOFLOXACIN 500 MG PO TABS
500.0000 mg | ORAL_TABLET | Freq: Every day | ORAL | Status: DC
  Administered 2013-10-09 – 2013-10-10 (×2): 500 mg via ORAL
  Filled 2013-10-09 (×2): qty 1

## 2013-10-09 MED ORDER — METHYLPREDNISOLONE SODIUM SUCC 40 MG IJ SOLR
40.0000 mg | INTRAMUSCULAR | Status: DC
  Administered 2013-10-10: 40 mg via INTRAVENOUS
  Filled 2013-10-09: qty 1

## 2013-10-09 NOTE — Progress Notes (Signed)
PROGRESS NOTE  [name] ZOX:[id number] DOB: 11/07/1940 DOA: 10/08/2013 PCP: [name]  Summary: 73 year old man with COPD GOLD II, chronic hypoxic respiratory failure with hypoxia, ongoing smoking presented emergency department with 6 day history of constant unremitting chest pain as well as increasing shortness of breath. Initial evaluation suggested COPD exacerbation, chest pain was felt to be atypical in patient was referred for admission.  Assessment/Plan: 1. COPD exacerbation. Appears to be improving. Chronic hypoxia stable. 2. Atypical chest pain. Troponins negative, no further evaluation suggested. Often occurs in the setting of COPD exacerbation her chart review, chronic dysphagia may also play a role. 3. Chronic hypoxic respiratory failure secondary to COPD on home oxygen. Stable. 4. Diabetes mellitus type 2. Stable. Hold metformin. Continue sliding scale insulin. 5. Chronic leukocytosis. Currently normal. Long-standing. Suspect related to smoking. Followup as an outpatient. 6. Chronic normocytic anemia. Stable. Followup as an outpatient. 7. Paroxysmal atrial fibrillation. Not an anticoagulation candidate secondary to poor compliance, history of falls, ongoing alcohol abuse/dependence. 8. Alcohol dependence. No evidence of alcohol withdrawal. 9. Tobacco dependence. Recommend cessation. 10. Anxiety, PTSD.   Appears to be improving. Taper steroids, continue supplemental oxygen, antibiotics and nebulizer therapy.  Discontinue telemetry  Anticipate discharge next 48 hours.  Code Status: full code DVT prophylaxis: Enoxaparin Family Communication: none present Disposition Plan: home  Brandon Sacksaniel [name], Hamilton  [facility]  Pager [phone] If 7PM-7AM, please contact night-coverage at [email or web address], password [email or web address] 10/09/2013, 10:42 AM  LOS: 1 day   Consultants:  none  Procedures:    Antibiotics:  Levaquin 3/8 >>  HPI/Subjective: He feels somewhat  better today. Breathing better. Constant chronic chest pain stable.  Objective: Filed Vitals:   10/08/13 2018 10/08/13 2045 10/09/13 0438 10/09/13 0750  BP:   166/62   Pulse:   74   Temp:   97.9 F (36.6 C)   TempSrc:   Oral   Resp:   18   Height:  5\' 4"  (1.626 m)    Weight:  63.5 kg (139 lb 15.9 oz) 66.3 kg (146 lb 2.6 oz)   SpO2: 95%  99% 97%    Intake/Output Summary (Last 24 hours) at 10/09/13 1042 Last data filed at 10/09/13 1030  Gross per 24 hour  Intake    483 ml  Output    825 ml  Net   -342 ml     Filed Weights   10/08/13 2045 10/09/13 0438  Weight: 63.5 kg (139 lb 15.9 oz) 66.3 kg (146 lb 2.6 oz)    Exam:   Afebrile, vital signs are stable. Stable hypoxia.  Gen. Appears calm and comfortable. Speech fluent and clear.  Cardiovascular regular rate and rhythm. No murmur, rub or gallop  Telemetry sinus rhythm, brief run of narrow complex tachycardia.  Respiratory fair air movement. Few wheezes. No rhonchi or rales. Improved overall with decreased respiratory effort.  Psychiatric grossly normal mood and affect. Speech fluent and appropriate.  Data Reviewed:  Capillary blood sugars stable. No hypoglycemia.  Basic metabolic panel unremarkable.  Troponin is negative.  Hemoglobin stable at 10.0.  Leukocytosis resolved.  Scheduled Meds: . enoxaparin (LOVENOX) injection  40 mg Subcutaneous Q24H  . folic acid  1 mg Oral Daily  . insulin aspart  0-9 Units Subcutaneous TID WC  . ipratropium-albuterol  3 mL Nebulization QID  . methylPREDNISolone (SOLU-MEDROL) injection  40 mg Intravenous Q12H  . multivitamin with minerals  1 tablet Oral Daily  . pantoprazole  40 mg Oral Daily  . PARoxetine  20 mg Oral Daily  . potassium chloride SA  20 mEq Oral Daily  . sodium chloride  3 mL Intravenous Q12H  . sodium chloride  3 mL Intravenous Q12H  . tamsulosin  0.4 mg Oral QPC supper  . thiamine  100 mg Oral Daily   Or  . thiamine  100 mg Intravenous Daily  .  traZODone  25 mg Oral Daily   Continuous Infusions:   Principal Problem:   COPD exacerbation Active Problems:   DM type 2 (diabetes mellitus, type 2)   Chronic respiratory failure with hypoxia   Paroxysmal atrial fibrillation   Chest pain   COPD (chronic obstructive pulmonary disease)   Time spent 15 minutes

## 2013-10-09 NOTE — Progress Notes (Signed)
Utilization review Completed Diesel Lina RN BSN   

## 2013-10-10 LAB — GLUCOSE, CAPILLARY
GLUCOSE-CAPILLARY: 101 mg/dL — AB (ref 70–99)
Glucose-Capillary: 136 mg/dL — ABNORMAL HIGH (ref 70–99)

## 2013-10-10 MED ORDER — ACETAMINOPHEN 500 MG PO TABS: 500.0000 mg | ORAL_TABLET | ORAL | Status: DC | PRN

## 2013-10-10 MED ORDER — PREDNISONE 10 MG PO TABS: ORAL_TABLET | ORAL | Status: DC

## 2013-10-10 MED ORDER — PREDNISONE 20 MG PO TABS: 40.0000 mg | ORAL_TABLET | Freq: Every day | ORAL | Status: DC

## 2013-10-10 MED ORDER — LEVOFLOXACIN 500 MG PO TABS: 500.0000 mg | ORAL_TABLET | Freq: Every day | ORAL | Status: DC

## 2013-10-10 NOTE — Discharge Summary (Signed)
Physician Discharge Summary  [name]:[id number] DOB: 11-05-1940 DOA: 10/08/2013  PCP: Josue Hector, MD  Admit date: 10/08/2013 Discharge date: 10/10/2013  Recommendations for Outpatient Follow-up:  1. Resolution of COPD exacerbation. 2. Alcohol dependence. Continue to recommend cessation. Patient requested narcotic and benzodiazepine refills on discharge, these were not provided. He has been instructed to contact his primary care physician. He has been advised of potential serious illness or death from mixing alcohol with these medications. 3. Tobacco dependence. Continue recommend cessation.   Follow-up Information   Follow up with Josue Hector, MD. Schedule an appointment as soon as possible for a visit in 1 week.   Specialty:  Family Medicine   Contact information:   [address] [place] [address] [phone]      Discharge Diagnoses:  1. COPD exacerbation 2. Atypical chest pain 3. Chronic hypoxic respiratory failure 4. Diabetes mellitus type 2 5. Paroxysmal atrial fibrillation, no warfarin candidate currently secondary to noncompliance, falls, alcohol abuse/dependence 6. Alcohol dependence 7. Tobacco dependence  Discharge Condition: Improved Disposition: Home  Diet recommendation: Heart healthy diabetic diet  Filed Weights   10/08/13 2045 10/09/13 0438 10/10/13 0400  Weight: 63.5 kg (139 lb 15.9 oz) 66.3 kg (146 lb 2.6 oz) 66.4 kg (146 lb 6.2 oz)    History of present illness:  73 year old man with COPD GOLD II, chronic hypoxic respiratory failure with hypoxia, ongoing smoking presented emergency department with 6 day history of constant unremitting chest pain as well as increasing shortness of breath. Initial evaluation suggested COPD exacerbation, chest pain was felt to be atypical in patient was referred for admission.  Hospital Course:  Patient was treated with steroids, nebulizers, antibiotics with gradual improvement in COPD  exacerbation. He is now baseline. Chronic hypoxia stable. Plan discharge home on taper of steroids and complete antibiotics. Comorbidities remained stable during this hospitalization, see individual issues below.  1. COPD exacerbation. Appears resolved. Chronic hypoxia stable. 2. Atypical chest pain. Troponins negative, no further evaluation suggested. Often occurs in the setting of COPD exacerbation per chart review, chronic dysphagia may also play a role. 3. Chronic hypoxic respiratory failure secondary to COPD on home oxygen. Stable. 4. Diabetes mellitus type 2. Stable. Resume metformin on discharge.  5. Chronic leukocytosis. Currently normal. Long-standing. Suspect related to smoking. Followup as an outpatient. 6. Chronic normocytic anemia. Stable. Followup as an outpatient. 7. Paroxysmal atrial fibrillation. Not an anticoagulation candidate secondary to poor compliance, history of falls, ongoing alcohol abuse/dependence. In sinus rhythm. ASA causes upset stomach. 8. Alcohol dependence. No evidence of alcohol withdrawal. Seen by social work, he is interested in slowly decreasing his intake. 9. Tobacco dependence. Recommend cessation. He is not interested. 10. Anxiety, PTSD. Stable. I recommended strict cessation from smoking and discontinuing alcohol with support.  Consultants:  none Procedures: none Antibiotics:  Levaquin 3/7 >> 3/11  Discharge Instructions  Discharge Orders   Future Orders Complete By Expires   Activity as tolerated - No restrictions  As directed    Diet - low sodium heart healthy  As directed    Diet Carb Modified  As directed    Discharge instructions  As directed    Comments:     Call your physician or seek immediate medical assistance for shortness of breath, worsening of condition or worsening chest pain. Please stop smoking. Please stop alcohol consumption under the care of your physician.       Medication List         acetaminophen 500 MG tablet  Commonly known as:  TYLENOL  Take 1 tablet (500 mg total) by mouth every 4 (four) hours as needed for mild pain.     albuterol 108 (90 BASE) MCG/ACT inhaler  Commonly known as:  PROVENTIL HFA;VENTOLIN HFA  Inhale 2 puffs into the lungs every 6 (six) hours as needed for wheezing.     albuterol (2.5 MG/3ML) 0.083% nebulizer solution  Commonly known as:  PROVENTIL  Take 2.5 mg by nebulization every 6 (six) hours as needed for wheezing or shortness of breath.     ALPRAZolam 0.25 MG tablet  Commonly known as:  XANAX  Take 1 tablet by mouth 3 (three) times daily.     ASPERCREME EX  Apply 1 application topically as directed.     budesonide-formoterol 160-4.5 MCG/ACT inhaler  Commonly known as:  SYMBICORT  Inhale 2 puffs into the lungs 2 (two) times daily.     furosemide 20 MG tablet  Commonly known as:  LASIX  Take 20 mg by mouth daily. For swelling in your feet and legs. Take potassium supplement with this medicine.     levofloxacin 500 MG tablet  Commonly known as:  LEVAQUIN  Take 1 tablet (500 mg total) by mouth daily. Start 3/10.     metFORMIN 1000 MG tablet  Commonly known as:  GLUCOPHAGE  Take 1,000 mg by mouth 2 (two) times daily.     OXYGEN-HELIUM IN  Inhale 2-3 L into the lungs continuous.     pantoprazole 40 MG tablet  Commonly known as:  PROTONIX  Take 1 tablet by mouth daily.     PARoxetine 20 MG tablet  Commonly known as:  PAXIL  Take 1 tablet by mouth daily.     potassium chloride SA 20 MEQ tablet  Commonly known as:  K-DUR,KLOR-CON  Take 1 tablet by mouth daily.     predniSONE 10 MG tablet  Commonly known as:  DELTASONE  Start March 10. Take 40 mg by mouth daily for 3 days. Then take 20 mg by mouth daily for 3 days. Then take 10 mg by mouth daily then stop.     tamsulosin 0.4 MG Caps capsule  Commonly known as:  FLOMAX  Take 1 capsule (0.4 mg total) by mouth daily after supper.     TESSALON PERLES 100 MG capsule  Generic drug:  benzonatate  Take  200 mg by mouth 3 (three) times daily as needed for cough.     tiotropium 18 MCG inhalation capsule  Commonly known as:  SPIRIVA  Place 18 mcg into inhaler and inhale daily.     traZODone 50 MG tablet  Commonly known as:  DESYREL  Take 0.5 tablets by mouth daily.       Allergies  Allergen Reactions  . Aspirin Other (See Comments)    Stomach upset    The results of significant diagnostics from this hospitalization (including imaging, microbiology, ancillary and laboratory) are listed below for reference.    Significant Diagnostic Studies: Dg Chest 2 View  10/08/2013   CLINICAL DATA:  Shortness of breath and weakness.  EXAM: CHEST  2 VIEW  COMPARISON:  DG CHEST 1V PORT dated 08/01/2013; DG CHEST 1V PORT dated 08/18/2012; CT ANGIO CHEST W/CM &/OR WO/CM dated 01/24/2013  FINDINGS: Two views demonstrate lucency in the upper lungs suggesting emphysematous changes. No focal airspace disease. Heart and mediastinum are within normal limits. The trachea is midline. The bony thorax is intact.  IMPRESSION: Emphysema.  No acute chest findings.   Electronically  Signed   By: Richarda OverlieAdam  Henn M.D.   On: 10/08/2013 14:45   Labs: Basic Metabolic Panel:  Recent Labs Lab 10/08/13 1346 10/09/13 0254  NA 136* 136*  K 3.7 4.5  CL 95* 96  CO2 24 27  GLUCOSE 99 226*  BUN 9 18  CREATININE 0.97 1.12  CALCIUM 9.0 9.2   CBC:  Recent Labs Lab 10/08/13 1346 10/09/13 0254  WBC 13.5* 7.2  NEUTROABS 9.7*  --   HGB 11.1* 10.0*  HCT 33.0* 28.8*  MCV 91.4 90.9  PLT 281 242   Cardiac Enzymes:  Recent Labs Lab 10/08/13 1346 10/08/13 2006 10/09/13 0254 10/09/13 0900  TROPONINI <0.30 <0.30 <0.30 <0.30    Recent Labs  01/24/13 1615 10/08/13 1346  PROBNP 110.5 251.5*   CBG:  Recent Labs Lab 10/09/13 1126 10/09/13 1650 10/09/13 2054 10/10/13 0736 10/10/13 1106  GLUCAP 191* 269* 106* 101* 136*    Principal Problem:   COPD exacerbation Active Problems:   DM type 2 (diabetes mellitus,  type 2)   Chronic respiratory failure with hypoxia   Paroxysmal atrial fibrillation   Chest pain   COPD (chronic obstructive pulmonary disease)   Time coordinating discharge: 35 minutes  Signed:  Brendia Sacksaniel Goodrich, MD [facility] 10/10/2013, 2:38 PM

## 2013-10-10 NOTE — Progress Notes (Signed)
PROGRESS NOTE  [name] ZOX:[id number] DOB: 08/29/1940 DOA: 10/08/2013 PCP: Josue HectorNYLAND,LEONARD ROBERT, MD  Summary: 73 year old man with COPD GOLD II, chronic hypoxic respiratory failure with hypoxia, ongoing smoking presented emergency department with 6 day history of constant unremitting chest pain as well as increasing shortness of breath. Initial evaluation suggested COPD exacerbation, chest pain was felt to be atypical in patient was referred for admission.  Assessment/Plan: 1. COPD exacerbation. Appears resolved. Chronic hypoxia stable. 2. Atypical chest pain. Troponins negative, no further evaluation suggested. Often occurs in the setting of COPD exacerbation per chart review, chronic dysphagia may also play a role. 3. Chronic hypoxic respiratory failure secondary to COPD on home oxygen. Stable. 4. Diabetes mellitus type 2. Stable. Resume metformin on discharge.  5. Chronic leukocytosis. Currently normal. Long-standing. Suspect related to smoking. Followup as an outpatient. 6. Chronic normocytic anemia. Stable. Followup as an outpatient. 7. Paroxysmal atrial fibrillation. Not an anticoagulation candidate secondary to poor compliance, history of falls, ongoing alcohol abuse/dependence. In sinus rhythm. 8. Alcohol dependence. No evidence of alcohol withdrawal. Seen by social work, he is interested in slowly decreasing his intake. 9. Tobacco dependence. Recommend cessation. He is not interested. 10. Anxiety, PTSD. Stable.   Continues to improve. Discharged home today on steroid taper, complete antibiotics. He reports he has also COPD medications at home. He did request refills on narcotics and benzodiazepines but I instructed him to call his primary care physician for these.  I recommended strict cessation from smoking and discontinuing alcohol with support.  Code Status: full code DVT prophylaxis: Enoxaparin Family Communication: none present Disposition Plan: home  Brendia Sacksaniel  Atlas Kuc, MD  [facility]  Pager [phone] If 7PM-7AM, please contact night-coverage at [email or web address], password [email or web address] 10/10/2013, 2:20 PM  LOS: 2 days   Consultants:  none  Procedures:    Antibiotics:  Levaquin 3/7 >> 3/11  HPI/Subjective: Continues to feel better. Breathing better.  Objective: Filed Vitals:   10/10/13 0138 10/10/13 0400 10/10/13 0732 10/10/13 1113  BP: 122/62 122/64    Pulse: 83 96    Temp:  98.1 F (36.7 C)    TempSrc:  Oral    Resp:  18    Height:      Weight:  66.4 kg (146 lb 6.2 oz)    SpO2:  95% 100% 99%    Intake/Output Summary (Last 24 hours) at 10/10/13 1420 Last data filed at 10/10/13 0900  Gross per 24 hour  Intake    903 ml  Output    800 ml  Net    103 ml     Filed Weights   10/08/13 2045 10/09/13 0438 10/10/13 0400  Weight: 63.5 kg (139 lb 15.9 oz) 66.3 kg (146 lb 2.6 oz) 66.4 kg (146 lb 6.2 oz)    Exam:   Afebrile, vital signs are stable. Stable hypoxia.  General. Appears calm and comfortable. Speech fluent and clear.  Cardiovascular regular rate and rhythm. No murmur, rub or gallop. No lower extremity edema.  Respiratory clear to auscultation bilaterally. No wheezes, rales or rhonchi. Normal respiratory effort.  Psychiatric grossly normal mood and affect. Speech fluent and appropriate  Data Reviewed:  Excellent oral intake.  Capillary blood sugars stable  Scheduled Meds: . aspirin EC  81 mg Oral Daily  . enoxaparin (LOVENOX) injection  40 mg Subcutaneous Q24H  . folic acid  1 mg Oral Daily  . insulin aspart  0-9 Units Subcutaneous TID WC  . ipratropium-albuterol  3 mL Nebulization QID  . levofloxacin  500 mg Oral Daily  . methylPREDNISolone (SOLU-MEDROL) injection  40 mg Intravenous Q24H  . multivitamin with minerals  1 tablet Oral Daily  . pantoprazole  40 mg Oral Daily  . PARoxetine  20 mg Oral Daily  . potassium chloride SA  20 mEq Oral Daily  . sodium chloride  3 mL Intravenous Q12H  . sodium  chloride  3 mL Intravenous Q12H  . tamsulosin  0.4 mg Oral QPC supper  . thiamine  100 mg Oral Daily   Or  . thiamine  100 mg Intravenous Daily  . traZODone  25 mg Oral Daily   Continuous Infusions:   Principal Problem:   COPD exacerbation Active Problems:   DM type 2 (diabetes mellitus, type 2)   Chronic respiratory failure with hypoxia   Paroxysmal atrial fibrillation   Chest pain   COPD (chronic obstructive pulmonary disease)

## 2013-10-10 NOTE — Progress Notes (Signed)
Patient discharged with instructions, prescriptions, and carenotes. She verbalized understanding via teach back method.  The patient left the floor with staff via w/c in stable condition.  

## 2013-10-10 NOTE — Progress Notes (Signed)
Nutrition Brief Note  Patient identified on the Malnutrition Screening Tool (MST) Report  Wt Readings from Last 15 Encounters:  10/10/13 146 lb 6.2 oz (66.4 kg)  08/18/13 149 lb 14.4 oz (67.994 kg)  07/13/13 154 lb (69.854 kg)  07/08/13 153 lb (69.4 kg)  06/03/13 150 lb (68.04 kg)  05/16/13 157 lb 13.6 oz (71.6 kg)  05/10/13 155 lb (70.308 kg)  02/01/13 147 lb 11.3 oz (67 kg)  01/25/13 149 lb 11.1 oz (67.9 kg)  09/04/12 166 lb 7.2 oz (75.5 kg)  08/24/12 171 lb 4.8 oz (77.701 kg)  06/15/12 140 lb (63.504 kg)  07/04/11 139 lb 3.2 oz (63.141 kg)    Body mass index is 25.11 kg/(m^2). Patient meets criteria for normal weight based on current BMI.   Current diet order is Heart Healthy, Carb Modified, patient is consuming approximately 50-100% of meals at this time. Labs and medications reviewed.   No nutrition interventions warranted at this time. If nutrition issues arise, please consult RD.   Anhthu Perdew A. Mayford KnifeWilliams, RD, LDN Pager: [phone]

## 2013-10-10 NOTE — Clinical Social Work Psychosocial (Signed)
Clinical Social Work Department BRIEF PSYCHOSOCIAL ASSESSMENT 10/10/2013  Patient:  [name], [name]     Account Number:  [id number]     Admit date:  10/08/2013  Clinical Social Worker:  Edwyna Shell, [place]  Date/Time:  10/10/2013 01:00 PM  Referred by:  Physician  Date Referred:  10/10/2013 Referred for  Substance Abuse   Other Referral:   Interview type:  Patient Other interview type:    PSYCHOSOCIAL DATA Living Status:  ALONE Admitted from facility:   Level of care:   Primary support name:  Claudette Head Primary support relationship to patient:  CHILD, ADULT Degree of support available:   Patient lives alone, has periods of limited ability due to COPD issues, states he has support he can contact if needed at home.    CURRENT CONCERNS Current Concerns  Substance Abuse   Other Concerns:    SOCIAL WORK ASSESSMENT / PLAN CSW met w patient at bedside, patient alert and oriented x4.  Administered SBIRT, patient scored 13 (consumption - 4, dependence - 0, alcohol related problem score - 9), indicating risky drinking.  Patient states "I am an alcoholic - I had a bad drinking problem."  Says he used to drink whiskey and "couldnt handle it."  Now drinking 6 pack/week.  Also states that he has reduced his smoking from 2 packs/day to half pack/day. Patient is long time member of AA, has not attended meetings regularly in recent past due to his increasing physical disability.  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  Also had alcohol treatment at [facility].  Suffers from PTSD from events experienced in [place] in 1960s, has been treated in specialized unit at [facility] in [place] as well as at [facility].  Says treatment has helped but that he still experiences significant symptoms (startles to certain sounds and words, for example).     Lives alone in [place] area.  Has some interaction w [facility] [place] who  are able to help him get to [facility] where he would like to reestablish primary care relatiionship and work on being admitted to SNF at [place]. Currently has Dr Mervin Hack as his PCP in [place].  Has son who is able to help him after work.    Was at [facility] after a recent discharge from [place] "I got used to it after 2 - 3 days", but does not want to return.  Prefers to return home, perhaps w home health social worker or RN to provide additional support.  Says he will contact [facility] and [facility] to [facility] at their primary care clinic w hopes of getting place in [place] SNF at some point.  Says that he has the resources needed to accomplish this.    CSW signing off at this point - patient has resources he needs to reconnect w [facility], knows AA meetings in area and can attend if he is able.  CSW offered option of phone meetings, patient states "all I like to say on the phone is hi and bye."   Assessment/plan status:  Psychosocial Support/Ongoing Assessment of Needs Other assessment/ plan:   Information/referral to community resources:   Offered AA list, patient says he knows AA meetings well and did not need it.    PATIENT'S/FAMILY'S RESPONSE TO PLAN OF CARE: Patient cordial, wants to be "honest" about his likelihood of complete cessation of drinking and smoking - says he needs these coping  measures when he gets "down" about his situation.        Edwyna Shell, LCSW Clinical Social Worker [phone])

## 2013-11-14 ENCOUNTER — Encounter (INDEPENDENT_AMBULATORY_CARE_PROVIDER_SITE_OTHER): Payer: Self-pay

## 2013-11-16 ENCOUNTER — Encounter (INDEPENDENT_AMBULATORY_CARE_PROVIDER_SITE_OTHER): Payer: Self-pay | Admitting: *Deleted

## 2013-11-24 ENCOUNTER — Ambulatory Visit (INDEPENDENT_AMBULATORY_CARE_PROVIDER_SITE_OTHER): Payer: Self-pay | Admitting: Internal Medicine

## 2014-01-31 ENCOUNTER — Encounter: Payer: Self-pay | Admitting: Cardiology

## 2014-01-31 NOTE — Progress Notes (Signed)
     Clinical Summary ERROR

## 2014-02-22 ENCOUNTER — Encounter (HOSPITAL_COMMUNITY): Payer: Self-pay | Admitting: Emergency Medicine

## 2014-02-22 ENCOUNTER — Emergency Department (HOSPITAL_COMMUNITY)
Admission: EM | Admit: 2014-02-22 | Discharge: 2014-02-27 | Disposition: A | Discharge status: Home / Self Care | Payer: Medicare Other | Attending: Emergency Medicine | Admitting: Emergency Medicine

## 2014-02-22 DIAGNOSIS — Z046 Encounter for general psychiatric examination, requested by authority: Secondary | ICD-10-CM | POA: Diagnosis present

## 2014-02-22 DIAGNOSIS — F411 Generalized anxiety disorder: Secondary | ICD-10-CM | POA: Diagnosis not present

## 2014-02-22 DIAGNOSIS — F3289 Other specified depressive episodes: Secondary | ICD-10-CM | POA: Diagnosis not present

## 2014-02-22 DIAGNOSIS — E119 Type 2 diabetes mellitus without complications: Secondary | ICD-10-CM | POA: Diagnosis not present

## 2014-02-22 DIAGNOSIS — D759 Disease of blood and blood-forming organs, unspecified: Secondary | ICD-10-CM | POA: Diagnosis not present

## 2014-02-22 DIAGNOSIS — F29 Unspecified psychosis not due to a substance or known physiological condition: Secondary | ICD-10-CM | POA: Insufficient documentation

## 2014-02-22 DIAGNOSIS — Z9981 Dependence on supplemental oxygen: Secondary | ICD-10-CM | POA: Diagnosis not present

## 2014-02-22 DIAGNOSIS — Z8719 Personal history of other diseases of the digestive system: Secondary | ICD-10-CM | POA: Diagnosis not present

## 2014-02-22 DIAGNOSIS — F329 Major depressive disorder, single episode, unspecified: Secondary | ICD-10-CM | POA: Insufficient documentation

## 2014-02-22 DIAGNOSIS — I10 Essential (primary) hypertension: Secondary | ICD-10-CM | POA: Diagnosis not present

## 2014-02-22 DIAGNOSIS — Z79899 Other long term (current) drug therapy: Secondary | ICD-10-CM | POA: Diagnosis not present

## 2014-02-22 DIAGNOSIS — I252 Old myocardial infarction: Secondary | ICD-10-CM | POA: Insufficient documentation

## 2014-02-22 DIAGNOSIS — Z8739 Personal history of other diseases of the musculoskeletal system and connective tissue: Secondary | ICD-10-CM | POA: Insufficient documentation

## 2014-02-22 DIAGNOSIS — J4489 Other specified chronic obstructive pulmonary disease: Secondary | ICD-10-CM | POA: Insufficient documentation

## 2014-02-22 DIAGNOSIS — F17200 Nicotine dependence, unspecified, uncomplicated: Secondary | ICD-10-CM | POA: Diagnosis not present

## 2014-02-22 DIAGNOSIS — Z862 Personal history of diseases of the blood and blood-forming organs and certain disorders involving the immune mechanism: Secondary | ICD-10-CM | POA: Insufficient documentation

## 2014-02-22 DIAGNOSIS — J449 Chronic obstructive pulmonary disease, unspecified: Secondary | ICD-10-CM | POA: Insufficient documentation

## 2014-02-22 DIAGNOSIS — G8929 Other chronic pain: Secondary | ICD-10-CM | POA: Diagnosis not present

## 2014-02-22 DIAGNOSIS — Z8669 Personal history of other diseases of the nervous system and sense organs: Secondary | ICD-10-CM | POA: Diagnosis not present

## 2014-02-22 DIAGNOSIS — F1010 Alcohol abuse, uncomplicated: Secondary | ICD-10-CM

## 2014-02-22 DIAGNOSIS — F4310 Post-traumatic stress disorder, unspecified: Secondary | ICD-10-CM | POA: Insufficient documentation

## 2014-02-22 DIAGNOSIS — Z8701 Personal history of pneumonia (recurrent): Secondary | ICD-10-CM | POA: Insufficient documentation

## 2014-02-22 LAB — RAPID URINE DRUG SCREEN, HOSP PERFORMED
Amphetamines: NOT DETECTED
Barbiturates: NOT DETECTED
Benzodiazepines: NOT DETECTED
Cocaine: NOT DETECTED
Opiates: NOT DETECTED
Tetrahydrocannabinol: NOT DETECTED

## 2014-02-22 LAB — URINALYSIS, ROUTINE W REFLEX MICROSCOPIC
Bilirubin Urine: NEGATIVE
Glucose, UA: NEGATIVE mg/dL
Ketones, ur: NEGATIVE mg/dL
Leukocytes, UA: NEGATIVE
Nitrite: NEGATIVE
Protein, ur: NEGATIVE mg/dL
Specific Gravity, Urine: <1.005 — ABNORMAL LOW (ref 1.005–1.030)
Urobilinogen, UA: 0.2 mg/dL (ref 0.0–1.0)
pH: 5 (ref 5.0–8.0)

## 2014-02-22 LAB — BASIC METABOLIC PANEL
Anion gap: 14 (ref 5–15)
BUN: 17 mg/dL (ref 6–23)
CO2: 23 mEq/L (ref 19–32)
Calcium: 9 mg/dL (ref 8.4–10.5)
Chloride: 102 mEq/L (ref 96–112)
Creatinine, Ser: 1.01 mg/dL (ref 0.50–1.35)
GFR calc Af Amer: 84 mL/min — ABNORMAL LOW (ref >90)
GFR calc non Af Amer: 73 mL/min — ABNORMAL LOW (ref >90)
Glucose, Bld: 95 mg/dL (ref 70–99)
Potassium: 4.8 mEq/L (ref 3.7–5.3)
Sodium: 139 mEq/L (ref 137–147)

## 2014-02-22 LAB — CBC WITH DIFFERENTIAL/PLATELET
Basophils Absolute: 0 10*3/uL (ref 0.0–0.1)
Basophils Relative: 0 % (ref 0–1)
Eosinophils Absolute: 0.2 10*3/uL (ref 0.0–0.7)
Eosinophils Relative: 2 % (ref 0–5)
HCT: 29.5 % — ABNORMAL LOW (ref 39.0–52.0)
Hemoglobin: 10 g/dL — ABNORMAL LOW (ref 13.0–17.0)
Lymphocytes Relative: 28 % (ref 12–46)
Lymphs Abs: 2.8 10*3/uL (ref 0.7–4.0)
MCH: 30.5 pg (ref 26.0–34.0)
MCHC: 33.9 g/dL (ref 30.0–36.0)
MCV: 89.9 fL (ref 78.0–100.0)
Monocytes Absolute: 0.5 10*3/uL (ref 0.1–1.0)
Monocytes Relative: 6 % (ref 3–12)
Neutro Abs: 6.2 10*3/uL (ref 1.7–7.7)
Neutrophils Relative %: 64 % (ref 43–77)
Platelets: 159 10*3/uL (ref 150–400)
RBC: 3.28 MIL/uL — ABNORMAL LOW (ref 4.22–5.81)
RDW: 13.8 % (ref 11.5–15.5)
WBC: 9.8 10*3/uL (ref 4.0–10.5)

## 2014-02-22 LAB — ETHANOL: Alcohol, Ethyl (B): 142 mg/dL — ABNORMAL HIGH (ref 0–11)

## 2014-02-22 LAB — URINE MICROSCOPIC-ADD ON

## 2014-02-22 MED ORDER — ACETAMINOPHEN 325 MG PO TABS
650.0000 mg | ORAL_TABLET | ORAL | Status: DC | PRN
  Administered 2014-02-23 – 2014-02-27 (×8): 650 mg via ORAL
  Filled 2014-02-22 (×9): qty 2

## 2014-02-22 MED ORDER — LORAZEPAM 1 MG PO TABS
1.0000 mg | ORAL_TABLET | Freq: Three times a day (TID) | ORAL | Status: DC | PRN
  Administered 2014-02-23 – 2014-02-27 (×10): 1 mg via ORAL
  Filled 2014-02-22 (×10): qty 1

## 2014-02-22 MED ORDER — ONDANSETRON HCL 4 MG PO TABS: 4.0000 mg | ORAL_TABLET | Freq: Three times a day (TID) | ORAL | Status: DC | PRN

## 2014-02-22 MED ORDER — LORAZEPAM 1 MG PO TABS
1.0000 mg | ORAL_TABLET | Freq: Once | ORAL | Status: AC
  Administered 2014-02-22: 1 mg via ORAL
  Filled 2014-02-22: qty 1

## 2014-02-22 MED ORDER — ALUM & MAG HYDROXIDE-SIMETH 200-200-20 MG/5ML PO SUSP
30.0000 mL | ORAL | Status: DC | PRN
Start: 2014-02-22 — End: 2014-02-27

## 2014-02-22 MED ORDER — NICOTINE 21 MG/24HR TD PT24
21.0000 mg | MEDICATED_PATCH | Freq: Every day | TRANSDERMAL | Status: DC
  Administered 2014-02-23 – 2014-02-26 (×5): 21 mg via TRANSDERMAL
  Filled 2014-02-22 (×6): qty 1

## 2014-02-22 NOTE — ED Notes (Addendum)
Pt picked up form home by deputy. Pt was talking to Child psychotherapistsocial worker and stated he had C4 in the house and was going to blow his house up. Pt states he has PTSD. Social worker told deputy that she would like arrangements made to [facility]. Pt states he has consumed four beer today. Pt is cursing at deputy and staff on arrival. Pt denies SI/HI. Pt states, "[facility]. I need something for my nerves, bad." [facility] social worker's name and number, Fuller CanadaQuiana Mock, [phone] ext [phone]. She states she will contact BH.

## 2014-02-22 NOTE — ED Notes (Signed)
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

## 2014-02-22 NOTE — BH Assessment (Signed)
[facility] Assessment Progress Note  This Clinical research associatewriter received a call from [place] with the [facility] regarding this pt.  She offers to send a transcription of her telephone conversation with the pt earlier today in which he reports SI and suggests that he may have access to weapons including C-4 explosive.  Transcript has been received by TTS.  At 18:25 I spoke to pt's nurse, Merry ProudBrandi, informing her of the transcript's content and that I would be faxing it to her at [phone].  Document was faxed, and at 18:32 I followed up with a call confirming receipt.  I recommended that EDP be informed.  Doylene Canninghomas Shylynn Bruning, MA Triage Specialist 02/22/2014 @ 18:34

## 2014-02-22 NOTE — ED Provider Notes (Signed)
CSN: [id number]     Arrival date & time 02/22/14  1634 History  This chart was scribed for Brandon Razor, MD by Milly Jakob, ED Scribe. The patient was seen in room APA17/APA17. Patient's care was started at 5:25 PM.    Chief Complaint  Patient presents with  . V70.1   The history is provided by the patient. No language interpreter was used.   HPI Comments: [name] is a 73 y.o. male with a history of COPD and PTSD who presents to the Emergency Department complaining that he is a veteran of the [name], and his PTSD has become worse in the past 3 weeks. He requests help for this problem. He denies any suicidal or homicidal ideations. He reports talking to the [name] in [place] about his PTSD, and states that they sent the police to his home to bring him here. He states that he has been on 4L of O2 at home, and that when he was picked by the police they took his O2. He denies any suicidal ideations. He report drinking 4 alcoholic beverages today. He reports that he is a smoker.   Past Medical History  Diagnosis Date  . Essential hypertension, benign   . Alcoholism   . PTSD (post-traumatic stress disorder)   . Chronic back pain   . Depression   . Peripheral neuropathy   . DJD (degenerative joint disease)   . COPD (chronic obstructive pulmonary disease)   . Anemia   . Myocardial infarction   . Type II or unspecified type diabetes mellitus without mention of complication, not stated as uncontrolled   . Esophageal dysmotility 07/04/2011  . Chronic respiratory failure with hypoxia   . Anxiety   . Paroxysmal atrial fibrillation 08/29/2012  . Partial small bowel obstruction 09/02/2012  . Bilateral lower extremity edema 09/03/2012    Possibly secondary to calcium channel blocker.  . Pneumonia    Past Surgical History  Procedure Laterality Date  . Hernia repair      X 3  . Spinal surgeries      X 4  . Appendectomy    . Prostate surgery     Family History  Problem Relation  Age of Onset  . Lung disease Mother   . Prostate cancer Father    History  Substance Use Topics  . Smoking status: Current Every Day Smoker -- 0.50 packs/day for 55 years    Types: Cigarettes  . Smokeless tobacco: Never Used     Comment: 1 pack a week  . Alcohol Use: Yes     Comment: 2-3 cases this week per pt.    Review of Systems  All other systems reviewed and are negative.  Allergies  Aspirin  Home Medications   Prior to Admission medications   Medication Sig Start Date End Date Taking? Authorizing Provider  acetaminophen (TYLENOL) 500 MG tablet Take 1 tablet (500 mg total) by mouth every 4 (four) hours as needed for mild pain. 10/10/13   Standley Brooking, MD  albuterol (PROVENTIL HFA;VENTOLIN HFA) 108 (90 BASE) MCG/ACT inhaler Inhale 2 puffs into the lungs every 6 (six) hours as needed for wheezing.    Historical Provider, MD  albuterol (PROVENTIL) (2.5 MG/3ML) 0.083% nebulizer solution Take 2.5 mg by nebulization every 6 (six) hours as needed for wheezing or shortness of breath.    Historical Provider, MD  ALPRAZolam Prudy Feeler) 0.25 MG tablet Take 1 tablet by mouth 3 (three) times daily. 05/06/13   Historical Provider,  MD  benzonatate (TESSALON PERLES) 100 MG capsule Take 200 mg by mouth 3 (three) times daily as needed for cough.    Historical Provider, MD  budesonide-formoterol (SYMBICORT) 160-4.5 MCG/ACT inhaler Inhale 2 puffs into the lungs 2 (two) times daily. 09/04/12 11/17/13  Elliot Cousinenise Fisher, MD  furosemide (LASIX) 20 MG tablet Take 20 mg by mouth daily. For swelling in your feet and legs. Take potassium supplement with this medicine. 09/04/12   Elliot Cousinenise Fisher, MD  levofloxacin (LEVAQUIN) 500 MG tablet Take 1 tablet (500 mg total) by mouth daily. Start 3/10. 10/10/13   Standley Brookinganiel P Goodrich, MD  metFORMIN (GLUCOPHAGE) 1000 MG tablet Take 1,000 mg by mouth 2 (two) times daily.    Historical Provider, MD  OXYGEN-HELIUM IN Inhale 2-3 L into the lungs continuous.    Historical Provider, MD   pantoprazole (PROTONIX) 40 MG tablet Take 1 tablet by mouth daily. 05/31/13   Historical Provider, MD  PARoxetine (PAXIL) 20 MG tablet Take 1 tablet by mouth daily. 05/23/13   Historical Provider, MD  potassium chloride SA (K-DUR,KLOR-CON) 20 MEQ tablet Take 1 tablet by mouth daily. 05/31/13   Historical Provider, MD  predniSONE (DELTASONE) 10 MG tablet Start March 10. Take 40 mg by mouth daily for 3 days. Then take 20 mg by mouth daily for 3 days. Then take 10 mg by mouth daily then stop. 10/10/13   Standley Brookinganiel P Goodrich, MD  Tamsulosin HCl (FLOMAX) 0.4 MG CAPS Take 1 capsule (0.4 mg total) by mouth daily after supper. 08/24/12   Rhetta MuraJai-Gurmukh Samtani, MD  tiotropium (SPIRIVA) 18 MCG inhalation capsule Place 18 mcg into inhaler and inhale daily.    Historical Provider, MD  traZODone (DESYREL) 50 MG tablet Take 0.5 tablets by mouth daily. 05/23/13   Historical Provider, MD  Trolamine Salicylate (ASPERCREME EX) Apply 1 application topically as directed.    Historical Provider, MD   Triage Vitals: BP 155/71  Pulse 96  Temp(Src) 97.8 F (36.6 C) (Oral)  Resp 17  SpO2 95% Physical Exam  Nursing note and vitals reviewed. Constitutional: He is oriented to person, place, and time. He appears well-developed and well-nourished. No distress.  Disheveled appearance. Smells of alcohol.   HENT:  Head: Normocephalic and atraumatic.  Eyes: Conjunctivae and EOM are normal.  Neck: Neck supple. No tracheal deviation present.  Cardiovascular: Normal rate, regular rhythm and normal heart sounds.   No murmur heard. Pulmonary/Chest: Effort normal and breath sounds normal. No respiratory distress. He has no wheezes. He has no rales.  Course breath sounds bilaterally.   Musculoskeletal: Normal range of motion.  Neurological: He is alert and oriented to person, place, and time.  Skin: Skin is warm and dry.  Psychiatric: He has a normal mood and affect. His behavior is normal.  Speech is slurred but understandable.  Thought process is disjointed. Does not appear to be responding to normal stimuli.     ED Course  Procedures (including critical care time) DIAGNOSTIC STUDIES: Oxygen Saturation is 95% on room air, adequate by my interpretation.    COORDINATION OF CARE: 5:31 PM-Discussed treatment plan with pt at bedside and pt agreed to plan.   Labs Review Labs Reviewed  ETHANOL - Abnormal; Notable for the following:    Alcohol, Ethyl (B) 142 (*)    All other components within normal limits  URINALYSIS, ROUTINE W REFLEX MICROSCOPIC - Abnormal; Notable for the following:    Specific Gravity, Urine <1.005 (*)    Hgb urine dipstick SMALL (*)  All other components within normal limits  CBC WITH DIFFERENTIAL - Abnormal; Notable for the following:    RBC 3.28 (*)    Hemoglobin 10.0 (*)    HCT 29.5 (*)    All other components within normal limits  BASIC METABOLIC PANEL - Abnormal; Notable for the following:    GFR calc non Af Amer 73 (*)    GFR calc Af Amer 84 (*)    All other components within normal limits  URINE MICROSCOPIC-ADD ON - Abnormal; Notable for the following:    Bacteria, UA FEW (*)    All other components within normal limits  URINE RAPID DRUG SCREEN (HOSP PERFORMED)  CBG MONITORING, ED    Imaging Review No results found.   EKG Interpretation None      MDM   Final diagnoses:  Psychosis, unspecified psychosis type  PTSD (post-traumatic stress disorder)  Alcohol abuse    72yM with etoh abuse, PTSD. Keeps saying is suffering from PTSD but cannot explain himself beyond this. I feel he is in need of psychiatric stabilization. Shows poor insight. TTS eval. Monitor for signs of withdrawal.   I personally preformed the services scribed in my presence. The recorded information has been reviewed is accurate. Brandon Razor, MD.    Brandon Razor, MD 02/23/14 [phone]

## 2014-02-22 NOTE — ED Notes (Signed)
Pt brought in by [facility], reported that pt has been drinking

## 2014-02-23 DIAGNOSIS — F29 Unspecified psychosis not due to a substance or known physiological condition: Secondary | ICD-10-CM

## 2014-02-23 DIAGNOSIS — F1010 Alcohol abuse, uncomplicated: Secondary | ICD-10-CM

## 2014-02-23 DIAGNOSIS — F4310 Post-traumatic stress disorder, unspecified: Secondary | ICD-10-CM

## 2014-02-23 LAB — CBG MONITORING, ED: Glucose-Capillary: 92 mg/dL (ref 70–99)

## 2014-02-23 MED ORDER — HYDROCODONE-ACETAMINOPHEN 5-325 MG PO TABS
1.0000 | ORAL_TABLET | Freq: Once | ORAL | Status: AC
  Administered 2014-02-23: 1 via ORAL

## 2014-02-23 MED ORDER — HYDROCODONE-ACETAMINOPHEN 5-325 MG PO TABS
ORAL_TABLET | ORAL | Status: AC
  Filled 2014-02-23: qty 1

## 2014-02-23 NOTE — BH Assessment (Signed)
Clinician consulted with Donell SievertSpencer Simon, PA who recommends pt for inpatient admission. [facility]. Clinician provided updates to Dr. Hyacinth MeekerMiller. Clinician left voicemail with Fuller CanadaQuiana Mock [facility] [place].  Yaakov Guthrieelilah Stewart, MSW, LCSW Triage Specialist [phone]

## 2014-02-23 NOTE — ED Provider Notes (Signed)
On morning rounds the patient is asleep. Patient is awaiting placement.   Gerhard Munchobert Lulie Hurd, MD 02/23/14 [phone]

## 2014-02-23 NOTE — ED Provider Notes (Signed)
[facility] - they agree he meets inpt criteria - [facility] now and in the AM for transfer if bed available.  Vida RollerBrian D Boleslaw Borghi, MD 02/23/14 [phone]

## 2014-02-23 NOTE — ED Notes (Signed)
Patient asleep on stretcher at this time.

## 2014-02-23 NOTE — ED Notes (Signed)
Pt sitting in chair by doorway. Pt stating pain of 10 to back and legs. Order received and given hydrocodone 5/325.

## 2014-02-23 NOTE — ED Notes (Signed)
States that he needed to see nurse to see if he can have Tylenol.

## 2014-02-23 NOTE — BH Assessment (Addendum)
[facility] Assessment Progress Note  The following facilities have been contacted in an effort to place this pt, with results as noted:  [facility]: as of 12:49 no beds are available today, but they will accept referral; documentation was faxed; at 14:54 I called back and spoke to [place] - pt declined due to need for detox, which they do not provide. *[facility]: per Thayer Ohmhris at 12:57 no beds are available today, but they will accept referral; documentation was faxed [facility]: per Lorrin JacksonJackie Castro @ 12:51 they are at capacity [facility]: per French Anaracy at 12:52 they are at capacity [facility]: per Shanda BumpsJessica at 12:53 their geriatric unit is at capacity [facility]: per Candise BowensJen at 12:59 they are at capacity *[facility]: per Christiane HaJonathan at 12:58 they would not be able to accommodate pt's need for O2; pt is therefore declined *[facility]: at 12:54 call rolled to voice mail; left message; will call again later; at 13:48 I called back and was advised by Jasmine DecemberSharon to fax information, which was sent; at 14:50 Maralyn SagoSarah called back - pt declined due to acuity. *[facility]: at 12:55 call rolled to voice mail; left message; will call again later; called again at 13:52; [facility]: first call received no response and did not roll to voice mail; second call at 13:31 rolled to voice mail; left message, will call again later; at 15:05 April Alexander called back from [phone].  She reports that they do not have beds currently, but that she will contact me tomorrow (02/24/2014) to see if placement is still needed.  She faxed an intake form to me which will be retained in case it is needed for referral tomorrow.  Doylene Canninghomas Brandon Hanton, MA Triage Specialist 02/23/2014 @ 15:07

## 2014-02-23 NOTE — ED Notes (Signed)
Pt. Becoming agitated and restless. See MAR for intervention.
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
## 2014-02-23 NOTE — ED Notes (Signed)
TTS at bedside. 
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
## 2014-02-23 NOTE — ED Notes (Signed)
Pt ambulated to restroom with moderate assistance.  Escorted back to room via wheelchair due to complaint of dizziness.  Primary RN notified.

## 2014-02-24 ENCOUNTER — Emergency Department (HOSPITAL_COMMUNITY): Payer: Medicare Other

## 2014-02-24 DIAGNOSIS — F4310 Post-traumatic stress disorder, unspecified: Secondary | ICD-10-CM | POA: Diagnosis not present

## 2014-02-24 LAB — CBG MONITORING, ED: Glucose-Capillary: 97 mg/dL (ref 70–99)

## 2014-02-24 MED ORDER — MOMETASONE FURO-FORMOTEROL FUM 200-5 MCG/ACT IN AERO
2.0000 | INHALATION_SPRAY | Freq: Two times a day (BID) | RESPIRATORY_TRACT | Status: DC
  Administered 2014-02-24 – 2014-02-27 (×6): 2 via RESPIRATORY_TRACT
  Filled 2014-02-24: qty 8.8

## 2014-02-24 MED ORDER — METFORMIN HCL 500 MG PO TABS
1000.0000 mg | ORAL_TABLET | Freq: Two times a day (BID) | ORAL | Status: DC
  Administered 2014-02-24 – 2014-02-26 (×5): 1000 mg via ORAL
  Filled 2014-02-24 (×6): qty 2

## 2014-02-24 MED ORDER — PANTOPRAZOLE SODIUM 40 MG PO TBEC
40.0000 mg | DELAYED_RELEASE_TABLET | Freq: Every day | ORAL | Status: DC
  Administered 2014-02-24 – 2014-02-27 (×4): 40 mg via ORAL
  Filled 2014-02-24 (×4): qty 1

## 2014-02-24 MED ORDER — ALBUTEROL SULFATE HFA 108 (90 BASE) MCG/ACT IN AERS
2.0000 | INHALATION_SPRAY | Freq: Four times a day (QID) | RESPIRATORY_TRACT | Status: DC | PRN
  Administered 2014-02-25 – 2014-02-26 (×3): 2 via RESPIRATORY_TRACT

## 2014-02-24 MED ORDER — TIOTROPIUM BROMIDE MONOHYDRATE 18 MCG IN CAPS
18.0000 ug | ORAL_CAPSULE | Freq: Every day | RESPIRATORY_TRACT | Status: DC
  Administered 2014-02-24 – 2014-02-27 (×4): 18 ug via RESPIRATORY_TRACT
  Filled 2014-02-24: qty 5

## 2014-02-24 MED ORDER — TAMSULOSIN HCL 0.4 MG PO CAPS
0.4000 mg | ORAL_CAPSULE | Freq: Every day | ORAL | Status: DC
  Administered 2014-02-24 – 2014-02-26 (×3): 0.4 mg via ORAL
  Filled 2014-02-24 (×3): qty 1

## 2014-02-24 MED ORDER — PAROXETINE HCL 20 MG PO TABS
20.0000 mg | ORAL_TABLET | Freq: Every day | ORAL | Status: DC
  Administered 2014-02-24 – 2014-02-27 (×4): 20 mg via ORAL
  Filled 2014-02-24 (×5): qty 1

## 2014-02-24 NOTE — ED Notes (Signed)
Pt ate well. Sleeping at present

## 2014-02-24 NOTE — BH Assessment (Signed)
BHH Assessment Progress Note  At 16:02 I spoke to [place], the AOD at the [facility].  He may be reached at [phone] or [phone].  He reports that they are unlikely to have a bed available for this pt over the weekend, but they have received all necessary documentation at this point, which they will retain.  We are welcome to call during the weekend to check on status.  After discussing pt's medical needs with Viviann SpareSteven and ascertaining that the pt has COPD and a history of a myocardial infarction, it was determined that it would be advisable to have EKG and chest x-ray results available, which geriatric psychiatric facilities often request.  I then spoke to the pt's nurse at [facility] to make this known to her.  This Clinical research associatewriter will continue to seek placement at an appropriate facility.  Doylene Canninghomas Olegario Emberson, MA Triage Specialist 02/24/2014 @ 16:37

## 2014-02-24 NOTE — ED Notes (Signed)
EDP in with pt now. 

## 2014-02-24 NOTE — ED Notes (Signed)
Pt informed it was not time for another dose of Tylenol.

## 2014-02-24 NOTE — ED Notes (Signed)
Wheeled pt to bathroom to assist with shaving.

## 2014-02-24 NOTE — ED Notes (Signed)
[facility] after we received Custody orders.

## 2014-02-24 NOTE — ED Notes (Signed)
Wheeled pt to shower.

## 2014-02-24 NOTE — BH Assessment (Signed)
BHH Assessment Progress Note  The following facilities have been contacted with results as noted:  *[facility]: call placed at 16:46 and rolled to voice mail.  Left message; no response.  Called again at 17:27 and at 17:43 and rolled to voice mail. [facility]: call placed at 16:47 and rolled to voice mail.  Left message; no response.  Called again at 17:28 and at 17:44 and rolled to voice mail. *[facility]: per Shawna OrleansMelanie at 16:49 they are at capacity [facility]: per Shanda BumpsJessica at 16:50 they are at capacity [facility]: per Selena BattenKim at 16:52 they are at capacity, but they will accept referrals.  Information faxed; at 17:24 Selena BattenKim confirms receipt. *[facility]: per Hope at 17:25 they are at capacity.  Doylene Canninghomas Laporsche Hoeger, MA Triage Specialist 02/24/2014 @ 17:44

## 2014-02-24 NOTE — ED Notes (Signed)
Pt declined bath at present states. States he didn't sleep well last night and he wants to rest for a while

## 2014-02-24 NOTE — BH Assessment (Signed)
RE-ASSESSMENT  Rowe ClackDonald L Haye is a 73 y.o. male patient was admitted to [facility] on 02/22/14 with reports psychotic symptoms, alcohol abuse and [facility] of claim of possession of C4 explosives and threat to blow up his house. Pt was evaluated by Kathryne SharperSyed Arfeen, MD who diagnoses Pt with PTSD, Psychosis NOS and Alcohol Abuse. Today Pt reports that he is feeling "nervous" and that his symptoms of PTSD are worse than usual. He reports having intrusive thoughts of past trauma related to combat experience. He also states that he is in pain due to multiple medical conditions including back problems and COPD. He denies current suicidal ideation or homicidal ideation. When asked about stating he had C4 explosives he says that he had C4 years ago and knows how to detonate it. He says [place] [facility] [facility] [employer] has known about the C4 for years and "I don't have any but if I did I wouldn't tell anyone about it." He states that he has seen "things, shadows, that move" and he has also heard "people mumbling" but cannot understand what they are saying. Pt denies any auditory or visual hallucinations since being admitted to ED. Pt reports he has a history of alcohol abuse and his pattern is to binge drink. Pt states he lives alone, has been off his medications and describes not feeling like he normally should.  Pt is dressed in a hospital gown, alert, oriented x4 with normal speech and normal motor behavior. Eye contact is good. Pt's mood is anxious and affect is congruent with mood. Thought process is coherent and relevant. There is no indication Pt is currently responding to internal stimuli or experiencing delusional thought content. Pt was cooperative throughout assessment.  [facility] and area psychiatric facilities for placement. TTS will continue to pursue inpatient psychiatric hospitalization.   Harlin RainFord Ellis Ria CommentWarrick Jr, LPC, [facility] Triage  Specialist [phone]

## 2014-02-24 NOTE — BH Assessment (Signed)
[facility]  [facility] [phone]), to follow up with April Alexander ([phone]) to see if bed available for the pt @ 0845.  Spoke with Aurelio BrashJoey (who spoke with April byr report), and he stated to fax over transfer packet with clinical information to [phone] for pt to be reviewed and considered there.  Packet to be completed and faxed.  Casimer LaniusKristen Amarri Michaelson, MS, [facility] Licensed Professional Counselor Triage Specialist

## 2014-02-24 NOTE — ED Notes (Signed)
BH requested EKG and chest xray. [facility] and probably [facility] not [facility] any [facility] over the weekend. Pt and Charge nurse aware. Pt resting at this time. Nad.

## 2014-02-24 NOTE — ED Provider Notes (Signed)
Pt is awaiting psych placement.  Sleeping this am. Filed Vitals:   02/23/14 2300  BP: 158/47  Pulse: 83  Temp:   Resp: 20   Continue to monitor  Linwood DibblesJon Rosaly Labarbera, MD 02/24/14 [phone]

## 2014-02-25 DIAGNOSIS — F4310 Post-traumatic stress disorder, unspecified: Secondary | ICD-10-CM | POA: Diagnosis not present

## 2014-02-25 LAB — CBG MONITORING, ED: Glucose-Capillary: 97 mg/dL (ref 70–99)

## 2014-02-25 MED ORDER — ALBUTEROL SULFATE (2.5 MG/3ML) 0.083% IN NEBU
2.5000 mg | INHALATION_SOLUTION | Freq: Once | RESPIRATORY_TRACT | Status: AC
  Administered 2014-02-25: 2.5 mg via RESPIRATORY_TRACT
  Filled 2014-02-25: qty 3

## 2014-02-25 MED ORDER — IPRATROPIUM BROMIDE 0.02 % IN SOLN: 0.5000 mg | Freq: Once | RESPIRATORY_TRACT | Status: DC

## 2014-02-25 MED ORDER — TAMSULOSIN HCL 0.4 MG PO CAPS
ORAL_CAPSULE | ORAL | Status: AC
  Filled 2014-02-25: qty 1

## 2014-02-25 MED ORDER — ALBUTEROL SULFATE (2.5 MG/3ML) 0.083% IN NEBU: 5.0000 mg | INHALATION_SOLUTION | Freq: Once | RESPIRATORY_TRACT | Status: DC

## 2014-02-25 MED ORDER — IPRATROPIUM-ALBUTEROL 0.5-2.5 (3) MG/3ML IN SOLN
3.0000 mL | Freq: Once | RESPIRATORY_TRACT | Status: AC
  Administered 2014-02-25: 3 mL via RESPIRATORY_TRACT
  Filled 2014-02-25: qty 3

## 2014-02-25 NOTE — ED Notes (Signed)
Patient resting in a position of comfort. No needs voiced at this time 

## 2014-02-25 NOTE — ED Notes (Signed)
Pt c/o feeling anxious.  Requesting medicine for anxiety.  Gave 1mg  po ativan.

## 2014-02-25 NOTE — ED Notes (Signed)
Pt up ambulatory with cane.  Without oxygen.  sats stayed 96-98%.  Pt no respiratory distress with ambulating.

## 2014-02-25 NOTE — ED Notes (Signed)
[facility]  Need to do CIWA every 2-4 hours for possible placement.

## 2014-02-25 NOTE — ED Notes (Signed)
Pt c/o " just feeling sick".  States he thinks he has pneumonia.  Lungs auscultated bilateral expiratory wheezing.  Pt used inhaler.  Also c/o headache,  Gave prn tylenol.  Also pt requested something for nerves.  Gave prn ativan.   Dr. Lynelle DoctorKnapp in to see pt.

## 2014-02-25 NOTE — Progress Notes (Signed)
[id number] placed f/u call to [facility] and spoke with Viviann SpareSteven, AOD.  States that he could not find any information on pt and requested that referral be re-faxed with updated information and he would re-run the entire referral by their MD.  AOD also stated that at the present they do not have any male psych beds available at this time but after it has been run by MD if accepted could be in line for a bed if one should come available.    Updated referral has been re-faxed.  Will follow-up 7.26 am for update on referral status.   Tomi BambergerMariya Crystle Carelli Disposition MHT

## 2014-02-25 NOTE — BH Assessment (Addendum)
BHH Assessment Progress Note  Called the following facilities in an attempt to place the pt:  [facility] @ [phone] - pt declined by Dr. Casilda CarlsSunderland due to meeting exclusionary criteria by needing O2 per Nita Sellsravis [facility] - no answer, left message @ [address] - no beds [facility] [address] [place] - pt declined there due to needs SA treatment [facility] - [facility] beds [facility] @ [phone] and pt declined there due to acuity [facility] - [facility]01 - may consider pt, but needs a CIWA score - It is an 11 per pt's nurse;  Per Sel[facility] BattenKim at [facility], pt will need ongoing CIWA scores every 2-4 hrs before their doctor there will consider him;  Informed pt's nurse at [facility]  [facility] - pt declined due to needs O2  Casimer LaniusKristen Zadia Uhde, MS, [facility] Licensed Firefighterrofessional Counselor Triage Specialist

## 2014-02-25 NOTE — ED Provider Notes (Signed)
Patient complains of increasing cough over the past week. He states sometimes he coughs up some white mucus. He states he feels feverish however he has not had fever. Patient states he is wheezing. Patient has COPD and states he's on continuous oxygen at home. He states his portable tanks are 2 L. In his house he has long oxygen tubing and uses 4 L.  On exam patient is noted to have diffuse wheezing with decreased air movement. He had just received 2 puffs of his inhaler.  Patient ordered nebulizer with albuterol and Atrovent.  10:00 Recheck, has improved air movement, scattered rhonichi, states he feels he needs another nebulizer.   10:09 Kristen, TSS, states patient has been declined [facility] all hospitals [facility]o his oxygen. Asks to get telepsych consult with their PA and then may need social work consult.   Pt had been noticed to not be wearing his oxygen when I went back to check him. Nurses ambulated patient and his pulse ox was 96-98% on RA without distress after his second nebulizer.  Will discuss further with TSS.   13:46 Pt has been seen by TSS, they were advised he has been off his oxygen most of my shift today and he ambulates without oxygen and doesn't get hypoxic. She [facility] [place] about [facility].   Dg Chest 2 View  02/24/2014   CLINICAL DATA:  COPD.  EXAM: CHEST  2 VIEW  COMPARISON:  10/08/2013.  FINDINGS: Lungs are hyperexpanded suggesting emphysema. No edema or focal airspace consolidation. Interstitial markings are diffusely coarsened with chronic features. The cardiopericardial silhouette is within normal limits for size. Imaged bony structures of the thorax are intact.  IMPRESSION: Changes consistent with emphysema. No acute cardiopulmonary process.   Electronically Signed   By: Kennith CenterEric  Mansell M.D.   On: 02/24/2014 18:40   Devoria AlbeIva Jeanmarc Viernes, MD, Armando GangFACEP   Ward GivensIva L Martha Ellerby, MD 02/25/14 [phone]

## 2014-02-25 NOTE — Consult Note (Addendum)
Telepsych Consultation   Reason for Consult:  Suicidal thought by means of blowing his house up Referring Physician: EDP MAHER SHON is an 73 y.o. male.  Assessment: AXIS I:  Major Depression, Recurrent severe, Post Traumatic Stress Disorder and Psychotic Disorder NOS AXIS II:  Deferred AXIS III:   Past Medical History  Diagnosis Date  . Essential hypertension, benign   . Alcoholism   . PTSD (post-traumatic stress disorder)   . Chronic back pain   . Depression   . Peripheral neuropathy   . DJD (degenerative joint disease)   . COPD (chronic obstructive pulmonary disease)   . Anemia   . Myocardial infarction   . Type II or unspecified type diabetes mellitus without mention of complication, not stated as uncontrolled   . Esophageal dysmotility 07/04/2011  . Chronic respiratory failure with hypoxia   . Anxiety   . Paroxysmal atrial fibrillation 08/29/2012  . Partial small bowel obstruction 09/02/2012  . Bilateral lower extremity edema 09/03/2012    Possibly secondary to calcium channel blocker.  . Pneumonia    AXIS IV:  other psychosocial or environmental problems, problems related to social environment and problems with primary support group AXIS V:  21-30 behavior considerably influenced by delusions or hallucinations OR serious impairment in judgment, communication OR inability to function in almost all areas  Plan:  Recommend psychiatric Inpatient admission when medically cleared.  Subjective:   [name] is a 73 y.o. male patient admitted with Major depressive disorder, recurrent severe with Psychosis and symptoms of PTSD.  HPI:  This is a 73 year old caucasian male, a veteran who was brought in by the [facility] to [facility] ER for evaluation of mental heath.  Patient reports today that he called the [place] [facility] and had made a comment that he was going to blow his house up with a bomb.  Patient states that he was not in his right state of mind when he made  the statement.   Patient reports he is depressed, fed up with life and not getting any assistance from anybody.  Patient reports he has not taken any of his medications in few weeks.  His reasons for not taking his medications includes running out of some and not interested in living anymore.  He rates his depression 9/10 with 10 being severe depression. Patient reports he feels hopeless, worthless and helpless.   Patient states he has been suffering from symptoms of PTSD; irritability, anxiety, nightmares, bad dreams.  Patient reports that his symptoms got worse as soon as he ran out of his medications for PTSD.  Patient reports poor sleep and appetite, patient reports he uses Oxygen at home but he did not use Oxygen throughout our interview.  Patient did not show signs of respiratory difficulty during our interview.  Patient states he  usually receives care at the [facility] clinic in [place] and [place].  Patient is agreeable to an inpatient admission for stabilization.  Today he denies SI/HI but reports auditory and visual hallucination.  Patient denies command hallucination but states he is not able to describe what the voices are saying and that he sees shadows around him.  We will seek placement at any hospital with available bed preferably the [facility] hospital at [facility].  HPI Elements:   Location:  Major depressive disorder, recurrent severe with Psychotic symptoms, PTSD, Suicidal ideation. Quality:  Insomnia, poor appetite, hopelessness, helplessness and worthlessness.  Worsening depressed mood, auditory/visual hallucination. Severity:  severe. Timing:  on going off and on. Duration:  chronic. Context:  Needing assistance with medications, feeling hopeless and helpless..  Past Psychiatric History: Past Medical History  Diagnosis Date  . Essential hypertension, benign   . Alcoholism   . PTSD (post-traumatic stress disorder)   . Chronic back pain   . Depression   . Peripheral neuropathy   .  DJD (degenerative joint disease)   . COPD (chronic obstructive pulmonary disease)   . Anemia   . Myocardial infarction   . Type II or unspecified type diabetes mellitus without mention of complication, not stated as uncontrolled   . Esophageal dysmotility 07/04/2011  . Chronic respiratory failure with hypoxia   . Anxiety   . Paroxysmal atrial fibrillation 08/29/2012  . Partial small bowel obstruction 09/02/2012  . Bilateral lower extremity edema 09/03/2012    Possibly secondary to calcium channel blocker.  . Pneumonia     reports that he has been smoking Cigarettes.  He has a 27.5 pack-year smoking history. He has never used smokeless tobacco. He reports that he drinks alcohol. He reports that he does not use illicit drugs. Family History  Problem Relation Age of Onset  . Lung disease Mother   . Prostate cancer Father    Family History Substance Abuse: Yes, Describe: (alcohol) Family Supports: No Living Arrangements: Alone Can pt return to current living arrangement?: Yes Allergies:   Allergies  Allergen Reactions  . Aspirin Other (See Comments)    Stomach upset    ACT Assessment Complete:  Yes:    Educational Status    Risk to Self: Risk to self Suicidal Ideation: No-Not Currently/Within Last 6 Months Suicidal Intent: No-Not Currently/Within Last 6 Months Is patient at risk for suicide?: No Suicidal Plan?: No-Not Currently/Within Last 6 Months Access to Means: No What has been your use of drugs/alcohol within the last 12 months?:  (etoh) Previous Attempts/Gestures: No How many times?: 0 Other Self Harm Risks: no Triggers for Past Attempts: None known Intentional Self Injurious Behavior: None Family Suicide History: Unknown Recent stressful life event(s): Trauma (Comment) (Pt dealing with PTSD symptoms. ) Persecutory voices/beliefs?: No Depression: Yes Depression Symptoms: Despondent;Feeling angry/irritable Substance abuse history and/or treatment for substance abuse?:  No Suicide prevention information given to non-admitted patients: Not applicable  Risk to Others: Risk to Others Homicidal Ideation: No Thoughts of Harm to Others: No Current Homicidal Intent: No Current Homicidal Plan: No Access to Homicidal Means: No Identified Victim:  (none reported) History of harm to others?: No Assessment of Violence: In past 6-12 months Violent Behavior Description: Pt was calm and cooperative during assessment.  Does patient have access to weapons?: Yes (Comment) (Pt reported having a gun in the home. ) Criminal Charges Pending?: No Does patient have a court date: No  Abuse: Abuse/Neglect Assessment (Assessment to be complete while patient is alone) Physical Abuse: Denies Verbal Abuse: Denies Sexual Abuse: Denies Exploitation of patient/patient's resources: Denies Self-Neglect: Denies  Prior Inpatient Therapy: Prior Inpatient Therapy Prior Inpatient Therapy: Yes Prior Therapy Dates: multiplel;dates unknown Prior Therapy Facilty/Provider(s): multiple ([facility], [facility]) Reason for Treatment: substance use, PTSD  Prior Outpatient Therapy: Prior Outpatient Therapy Prior Outpatient Therapy: Yes Prior Therapy Dates: current Prior Therapy Facilty/Provider(s): [facility] Reason for Treatment: PTSD  Additional Information: Additional Information 1:1 In Past 12 Months?: No CIRT Risk: No Elopement Risk: No Does patient have medical clearance?: Yes      Objective: Blood pressure 124/87, pulse 82, temperature 98.6 F (37 C), temperature source Oral, resp. rate 18,  SpO2 92.00%.There is no weight on file to calculate BMI. Results for orders placed during the hospital encounter of 02/22/14 (from the past 72 hour(s))  ETHANOL     Status: Abnormal   Collection Time    02/22/14  5:11 PM      Result Value Ref Range   Alcohol, Ethyl (B) 142 (*) 0 - 11 mg/dL   Comment:            LOWEST DETECTABLE LIMIT FOR     SERUM ALCOHOL IS 11 mg/dL     FOR MEDICAL PURPOSES  ONLY  CBC WITH DIFFERENTIAL     Status: Abnormal   Collection Time    02/22/14  5:11 PM      Result Value Ref Range   WBC 9.8  4.0 - 10.5 K/uL   RBC 3.28 (*) 4.22 - 5.81 MIL/uL   Hemoglobin 10.0 (*) 13.0 - 17.0 g/dL   HCT 29.5 (*) 39.0 - 52.0 %   MCV 89.9  78.0 - 100.0 fL   MCH 30.5  26.0 - 34.0 pg   MCHC 33.9  30.0 - 36.0 g/dL   RDW 13.8  11.5 - 15.5 %   Platelets 159  150 - 400 K/uL   Neutrophils Relative % 64  43 - 77 %   Neutro Abs 6.2  1.7 - 7.7 K/uL   Lymphocytes Relative 28  12 - 46 %   Lymphs Abs 2.8  0.7 - 4.0 K/uL   Monocytes Relative 6  3 - 12 %   Monocytes Absolute 0.5  0.1 - 1.0 K/uL   Eosinophils Relative 2  0 - 5 %   Eosinophils Absolute 0.2  0.0 - 0.7 K/uL   Basophils Relative 0  0 - 1 %   Basophils Absolute 0.0  0.0 - 0.1 K/uL  BASIC METABOLIC PANEL     Status: Abnormal   Collection Time    02/22/14  5:11 PM      Result Value Ref Range   Sodium 139  137 - 147 mEq/L   Potassium 4.8  3.7 - 5.3 mEq/L   Chloride 102  96 - 112 mEq/L   CO2 23  19 - 32 mEq/L   Glucose, Bld 95  70 - 99 mg/dL   BUN 17  6 - 23 mg/dL   Creatinine, Ser 1.01  0.50 - 1.35 mg/dL   Calcium 9.0  8.4 - 10.5 mg/dL   GFR calc non Af Amer 73 (*) >90 mL/min   GFR calc Af Amer 84 (*) >90 mL/min   Comment: (NOTE)     The eGFR has been calculated using the CKD EPI equation.     This calculation has not been validated in all clinical situations.     eGFR's persistently <90 mL/min signify possible Chronic Kidney     Disease.   Anion gap 14  5 - 15  URINE RAPID DRUG SCREEN (HOSP PERFORMED)     Status: None   Collection Time    02/22/14  5:50 PM      Result Value Ref Range   Opiates NONE DETECTED  NONE DETECTED   Cocaine NONE DETECTED  NONE DETECTED   Benzodiazepines NONE DETECTED  NONE DETECTED   Amphetamines NONE DETECTED  NONE DETECTED   Tetrahydrocannabinol NONE DETECTED  NONE DETECTED   Barbiturates NONE DETECTED  NONE DETECTED   Comment:            DRUG SCREEN FOR MEDICAL PURPOSES  ONLY.  IF CONFIRMATION IS NEEDED     FOR ANY PURPOSE, NOTIFY LAB     WITHIN 5 DAYS.                LOWEST DETECTABLE LIMITS     FOR URINE DRUG SCREEN     Drug Class       Cutoff (ng/mL)     Amphetamine      1000     Barbiturate      200     Benzodiazepine   200     Tricyclics       300     Opiates          300     Cocaine          300     THC              50  URINALYSIS, ROUTINE W REFLEX MICROSCOPIC     Status: Abnormal   Collection Time    02/22/14  5:50 PM      Result Value Ref Range   Color, Urine YELLOW  YELLOW   APPearance CLEAR  CLEAR   Specific Gravity, Urine <1.005 (*) 1.005 - 1.030   pH 5.0  5.0 - 8.0   Glucose, UA NEGATIVE  NEGATIVE mg/dL   Hgb urine dipstick SMALL (*) NEGATIVE   Bilirubin Urine NEGATIVE  NEGATIVE   Ketones, ur NEGATIVE  NEGATIVE mg/dL   Protein, ur NEGATIVE  NEGATIVE mg/dL   Urobilinogen, UA 0.2  0.0 - 1.0 mg/dL   Nitrite NEGATIVE  NEGATIVE   Leukocytes, UA NEGATIVE  NEGATIVE  URINE MICROSCOPIC-ADD ON     Status: Abnormal   Collection Time    02/22/14  5:50 PM      Result Value Ref Range   Squamous Epithelial / LPF RARE  RARE   WBC, UA 11-20  <3 WBC/hpf   RBC / HPF 0-2  <3 RBC/hpf   Bacteria, UA FEW (*) RARE  CBG MONITORING, ED     Status: None   Collection Time    02/23/14  7:20 AM      Result Value Ref Range   Glucose-Capillary 92  70 - 99 mg/dL  CBG MONITORING, ED     Status: None   Collection Time    02/24/14  6:21 AM      Result Value Ref Range   Glucose-Capillary 97  70 - 99 mg/dL  CBG MONITORING, ED     Status: None   Collection Time    02/25/14  8:18 AM      Result Value Ref Range   Glucose-Capillary 97  70 - 99 mg/dL   Labs are reviewed and are pertinent for Alcohol 142 and the rest are unremarkable..  Current Facility-Administered Medications  Medication Dose Route Frequency Provider Last Rate Last Dose  . acetaminophen (TYLENOL) tablet 650 mg  650 mg Oral Q4H PRN Raeford Razor, MD   650 mg at 02/25/14 5501  . albuterol  (PROVENTIL HFA;VENTOLIN HFA) 108 (90 BASE) MCG/ACT inhaler 2 puff  2 puff Inhalation Q6H PRN Enid Skeens, MD   2 puff at 02/25/14 [phone]  . albuterol (PROVENTIL) (2.5 MG/3ML) 0.083% nebulizer solution 5 mg  5 mg Nebulization Once Ward Givens, MD      . alum & mag hydroxide-simeth (MAALOX/MYLANTA) 200-200-20 MG/5ML suspension 30 mL  30 mL Oral PRN Raeford Razor, MD      . ipratropium (ATROVENT) nebulizer solution 0.5 mg  0.5  mg Nebulization Once Janice Norrie, MD      . LORazepam (ATIVAN) tablet 1 mg  1 mg Oral Q8H PRN Virgel Manifold, MD   1 mg at 02/25/14 4818  . metFORMIN (GLUCOPHAGE) tablet 1,000 mg  1,000 mg Oral BID Mariea Clonts, MD   1,000 mg at 02/25/14 5631  . mometasone-formoterol (DULERA) 200-5 MCG/ACT inhaler 2 puff  2 puff Inhalation BID Mariea Clonts, MD   2 puff at 02/25/14 0946  . nicotine (NICODERM CQ - dosed in mg/24 hours) patch 21 mg  21 mg Transdermal Daily Virgel Manifold, MD   21 mg at 02/25/14 0920  . ondansetron (ZOFRAN) tablet 4 mg  4 mg Oral Q8H PRN Virgel Manifold, MD      . pantoprazole (PROTONIX) EC tablet 40 mg  40 mg Oral Daily Mariea Clonts, MD   40 mg at 02/25/14 4970  . PARoxetine (PAXIL) tablet 20 mg  20 mg Oral Daily Mariea Clonts, MD   20 mg at 02/25/14 1006  . tamsulosin (FLOMAX) capsule 0.4 mg  0.4 mg Oral QPC supper Mariea Clonts, MD   0.4 mg at 02/24/14 1803  . tiotropium (SPIRIVA) inhalation capsule 18 mcg  18 mcg Inhalation Daily Mariea Clonts, MD   18 mcg at 02/25/14 2637   Current Outpatient Prescriptions  Medication Sig Dispense Refill  . albuterol (PROVENTIL HFA;VENTOLIN HFA) 108 (90 BASE) MCG/ACT inhaler Inhale 2 puffs into the lungs every 6 (six) hours as needed for wheezing.      [name] albuterol (PROVENTIL) (2.5 MG/3ML) 0.083% nebulizer solution Take 2.5 mg by nebulization every 6 (six) hours as needed for wheezing or shortness of breath.      . ALPRAZolam (XANAX) 0.25 MG tablet Take 1 tablet by mouth 3 (three) times daily.      .  budesonide-formoterol (SYMBICORT) 160-4.5 MCG/ACT inhaler Inhale 2 puffs into the lungs daily.      . furosemide (LASIX) 20 MG tablet Take 20 mg by mouth daily. For swelling in your feet and legs. Take potassium supplement with this medicine.      . metFORMIN (GLUCOPHAGE) 500 MG tablet Take 500 mg by mouth daily.      . OXYGEN-HELIUM IN Inhale 2-3 L into the lungs continuous.      . pantoprazole (PROTONIX) 20 MG tablet Take 20 mg by mouth daily.      [name] PARoxetine (PAXIL) 20 MG tablet Take 1 tablet by mouth daily.      . potassium chloride SA (K-DUR,KLOR-CON) 20 MEQ tablet Take 1 tablet by mouth daily.      . simvastatin (ZOCOR) 20 MG tablet Take 20 mg by mouth at bedtime.      . Tamsulosin HCl (FLOMAX) 0.4 MG CAPS Take 1 capsule (0.4 mg total) by mouth daily after supper.  30 capsule  0  . tiotropium (SPIRIVA) 18 MCG inhalation capsule Place 18 mcg into inhaler and inhale daily.      . traZODone (DESYREL) 50 MG tablet Take 0.5-1 tablets by mouth daily.       . Vitamin D, Ergocalciferol, (DRISDOL) 50000 UNITS CAPS capsule Take 50,000 Units by mouth every 7 (seven) days.      [name] acetaminophen (TYLENOL) 500 MG tablet Take 1 tablet (500 mg total) by mouth every 4 (four) hours as needed for mild pain.      Loura Pardon Salicylate (ASPERCREME EX) Apply 1 application topically as directed.        Psychiatric Specialty  Exam:     Blood pressure 124/87, pulse 82, temperature 98.6 F (37 C), temperature source Oral, resp. rate 18, SpO2 92.00%.There is no weight on file to calculate BMI.  General Appearance: Casual and Disheveled  Eye Contact::  Fair  Speech:  Clear and Coherent and Normal Rate  Volume:  Normal  Mood:  Angry, Depressed, Hopeless, Worthless and helpless  Affect:  Congruent, Depressed and Flat  Thought Process:  Coherent, Goal Directed and Intact  Orientation:  Full (Time, Place, and Person)  Thought Content:  Hallucinations: Auditory Visual and suicidal  Suicidal Thoughts:  No   Homicidal Thoughts:  No  Memory:  Immediate;   Good Recent;   Fair Remote;   Fair  Judgement:  Impaired  Insight:  Good  Psychomotor Activity:  Normal  Concentration:  Fair  Recall:  NA  Akathisia:  NA  Handed:  Right  AIMS (if indicated):     Assets:  Desire for Improvement  Sleep:      Treatment Plan Summary: have resumed his medications in the ER  Disposition:  Psychiatry recommends inpatient hospitalization for safety and stabilization.  Already discussed this assessment with Doctor Melvern Banker who agrees with the plan for admission. Disposition Initial Assessment Completed for this Encounter: Yes Disposition of Patient: Inpatient treatment program Type of inpatient treatment program: Adult  Delfin Gant   PMHNP-BC 02/25/2014 1:54 PM  I agreed with the findings, treatment and disposition plan of this patient which includes inpatient treatment. Merian Capron, MD

## 2014-02-25 NOTE — Progress Notes (Signed)
1355 placed phone call to [facility] and spoke with the AOD Feliz Beamravis requesting that pt be reconsidered at their facility once again [name].  Pt had been declined previously d/t the need of 2L of 02 [place] but has been noted in EPIC to have needed minimum use and SATs today while ambulating ranging 96-98%.  Feliz Beamravis requested documentation of vitals with 02 since admission to present to physician.  Vitals faxed to [phone].  Will call to f/u.   Brandon BambergerMariya Jashawna Hamilton Disposition MHT

## 2014-02-25 NOTE — ED Notes (Signed)
Spoke with Baxter HireKristen at [facility].  Notified of CIWA score.

## 2014-02-25 NOTE — BH Assessment (Addendum)
BHH Assessment Progress Note  Update:  Called EDP Knapp at [facility]@ 1010 to discuss pt's disposition.  She agreed that a tele psych by an extender [facility] appropriate to help determine pt disposition and to determine if SW needs to get involved with the pt, as he has been declined at so many facilities for inpatient treatment.  Tele psych pending for the pt.  Casimer LaniusKristen Tabathia Knoche, MS, [facility] Licensed Professional Counselor Triage Specialist

## 2014-02-26 DIAGNOSIS — F4310 Post-traumatic stress disorder, unspecified: Secondary | ICD-10-CM | POA: Diagnosis not present

## 2014-02-26 LAB — CBG MONITORING, ED: Glucose-Capillary: 92 mg/dL (ref 70–99)

## 2014-02-26 MED ORDER — TAMSULOSIN HCL 0.4 MG PO CAPS
ORAL_CAPSULE | ORAL | Status: AC
  Filled 2014-02-26: qty 1

## 2014-02-26 NOTE — Progress Notes (Signed)
1208- placed call back to [facility] and spoke with Feliz Beamravis, AOD, states that pt's referral has been received and is currently on a waiting list to be reviewed by their MD.  States they will have d/c's tomorrow and will most likely get reviewed on Mon 7.27.15 once they have some beds to open up.   Tomi BambergerMariya Victorino Fatzinger Disposition MHT

## 2014-02-26 NOTE — ED Notes (Addendum)
Patient agitated. States he wants to leave and is tired of being in [place]. RN explained process to Patient and that [facility] to [facility] him to facility. Patient verbalized understanding. Ativan and tylenol given for agitation and patient c/o pain to legs.

## 2014-02-26 NOTE — ED Provider Notes (Signed)
Pt stable, no distress awaiting placement BP 115/54  Pulse 107  Temp(Src) 98.6 F (37 C) (Oral)  Resp 18  SpO2 97%   Joya Gaskinsonald W Jnyah Brazee, MD 02/26/14 [phone]

## 2014-02-26 NOTE — ED Notes (Signed)
Patient resting in a position of comfort. Eyes closed, even non labored respirations. No distress noted

## 2014-02-26 NOTE — BH Assessment (Signed)
The PT was reassessed on this date.  PT was orientated x3, affect - labile, mood reported as "feeling couped up", thought process - loose assoications.  PT denied SI and HI and reported not remembering making previous threats about using explosives to blow up things.  PT reported AH of 'noises of people coming...when no one is around" with last episode this morning and VH of "shadows of people...when no one is around" with last episode 2 days ago.   The Veteran reported wanting to leave the hospital and go home since he has not been [facility].  The PT also reported "my nerves are shot..I need peace and quiet..I want help."  The PT reported several times during the reassessment that he planned to escape from the hospital because he no longer wanted to be admitted.  Deanna notified of PT's verbal threats of escape.

## 2014-02-26 NOTE — Progress Notes (Signed)
[phone]- [facility] [place] to speak with AOD regarding resubmitted referral, no answer and message was left.  Awaiting call back.   Tomi BambergerMariya Bear Osten Disposition MHT

## 2014-02-26 NOTE — BHH Counselor (Signed)
Scheduled reassessment of PT with Deanna for 1215.

## 2014-02-26 NOTE — ED Notes (Signed)
Patient resting in a position of comfort at this time. Eyes closed. Even non labored respirations.

## 2014-02-26 NOTE — ED Notes (Signed)
TTS called re-evaluating patient.

## 2014-02-26 NOTE — ED Notes (Signed)
Patient calm and pleasant at present time. Walking around nurse's station for exercise.

## 2014-02-26 NOTE — ED Notes (Signed)
Patient resting in bed at this time. Eyes closed. Even non labored respirations. No distress noted at this time.

## 2014-02-27 DIAGNOSIS — F4310 Post-traumatic stress disorder, unspecified: Secondary | ICD-10-CM | POA: Diagnosis not present

## 2014-02-27 DIAGNOSIS — F332 Major depressive disorder, recurrent severe without psychotic features: Secondary | ICD-10-CM

## 2014-02-27 LAB — CBG MONITORING, ED: Glucose-Capillary: 108 mg/dL — ABNORMAL HIGH (ref 70–99)

## 2014-02-27 MED ORDER — ALBUTEROL SULFATE (2.5 MG/3ML) 0.083% IN NEBU
3.0000 mL | INHALATION_SOLUTION | Freq: Four times a day (QID) | RESPIRATORY_TRACT | Status: DC | PRN
  Administered 2014-02-27: 3 mL via RESPIRATORY_TRACT
  Filled 2014-02-27: qty 3

## 2014-02-27 MED ORDER — METFORMIN HCL 500 MG PO TABS
1000.0000 mg | ORAL_TABLET | Freq: Two times a day (BID) | ORAL | Status: DC
  Administered 2014-02-27: 1000 mg via ORAL
  Filled 2014-02-27: qty 2

## 2014-02-27 NOTE — BH Assessment (Signed)
B[facility] Assessment Progress Note  At 10:14 April at [facility] called, reporting that Dr Casilda CarlsSunderland has declined pt for admission to their facility due to medical acuity.  This is due to the fact that pt requires O2.  Doylene Canninghomas Madicyn Mesina, MA Triage Specialist 02/25/2014 @ 12:00

## 2014-02-27 NOTE — Progress Notes (Signed)
The NP Renata CapriceConrad will receive a Tele Psych with the extender, Renata Capriceonrad.   Writer informed the nurse Lurena Joiner(Rebecca) working with the patient.

## 2014-02-27 NOTE — Consult Note (Signed)
Telepsych Consultation   Reason for Consult:  Suicidal thought by means of blowing his house up Referring Physician: EDP LOVIE AGRESTA is an 73 y.o. male.  Assessment: AXIS I:  Major Depression, Recurrent severe, Post Traumatic Stress Disorder and Psychotic Disorder NOS AXIS II:  Deferred AXIS III:   Past Medical History  Diagnosis Date  . Essential hypertension, benign   . Alcoholism   . PTSD (post-traumatic stress disorder)   . Chronic back pain   . Depression   . Peripheral neuropathy   . DJD (degenerative joint disease)   . COPD (chronic obstructive pulmonary disease)   . Anemia   . Myocardial infarction   . Type II or unspecified type diabetes mellitus without mention of complication, not stated as uncontrolled   . Esophageal dysmotility 07/04/2011  . Chronic respiratory failure with hypoxia   . Anxiety   . Paroxysmal atrial fibrillation 08/29/2012  . Partial small bowel obstruction 09/02/2012  . Bilateral lower extremity edema 09/03/2012    Possibly secondary to calcium channel blocker.  . Pneumonia    AXIS IV:  other psychosocial or environmental problems, problems related to social environment and problems with primary support group AXIS V:  61-70 mild symptoms  Plan:  No evidence of imminent risk to self or others at present.   Patient does not meet criteria for psychiatric inpatient admission. Supportive therapy provided about ongoing stressors. Discussed crisis plan, support from social network, calling 911, coming to the Emergency Department, and calling Suicide Hotline.  Subjective:   [name] is a 73 y.o. male patient admitted with Major depressive disorder, recurrent severe with Psychosis and symptoms of PTSD. During assessment, pt denies SI, HI, and AVH, contracts for safety. Additionally, pt reports that his symptoms which he initially experienced upon admission have subsided. He states that he had 4 beers and that this may have affected his judgment  4 days ago when he came into the [facility]. Pt reports that he has a son, [name] that we can speak to for collateral information but that he may not have his phone connected. This NP called his son, "[name]", but the phone appeared to be disconnected. Pt does affirm that he has no weapons or firearms in his home and that he knows to call 911 if he is feeling overwhelmed or suicidal/homicidal. Pt will seek followup with the [facility] hospital in [place] and is awaiting a return phone call.   HPI:  This is a 73 year old caucasian male, a veteran who was brought in by the [facility] to [facility] ER for evaluation of mental heath.  Patient reports today that he called the [place] at [facility] and had made a comment that he was going to blow his house up with a bomb.  Patient states that he was not in his right state of mind when he made the statement.   Patient reports he is depressed, fed up with life and not getting any assistance from anybody.  Patient reports he has not taken any of his medications in few weeks.  His reasons for not taking his medications includes running out of some and not interested in living anymore.  He rates his depression 9/10 with 10 being severe depression. Patient reports he feels hopeless, worthless and helpless.   Patient states he has been suffering from symptoms of PTSD; irritability, anxiety, nightmares, bad dreams.  Patient reports that his symptoms got worse as soon as he ran out of his medications for PTSD.  Patient reports poor sleep and appetite, patient reports he uses Oxygen at home but he did not use Oxygen throughout our interview.  Patient did not show signs of respiratory difficulty during our interview.  Patient states he  usually receives care at the [facility] clinic in [place] and [place].  Patient is agreeable to an inpatient admission for stabilization.  Today he denies SI/HI but reports auditory and visual hallucination.  Patient denies command hallucination but states he  is not able to describe what the voices are saying and that he sees shadows around him.  We will seek placement at any hospital with available bed preferably the [facility] hospital at [facility].  HPI Elements:   Location:  Major depressive disorder, recurrent severe with Psychotic symptoms, PTSD, Suicidal ideation. Quality:  Insomnia, poor appetite, hopelessness, helplessness and worthlessness.  Worsening depressed mood, auditory/visual hallucination. Severity:  severe. Timing:  on going off and on. Duration:  chronic. Context:  Needing assistance with medications, feeling hopeless and helpless..  Past Psychiatric History: Past Medical History  Diagnosis Date  . Essential hypertension, benign   . Alcoholism   . PTSD (post-traumatic stress disorder)   . Chronic back pain   . Depression   . Peripheral neuropathy   . DJD (degenerative joint disease)   . COPD (chronic obstructive pulmonary disease)   . Anemia   . Myocardial infarction   . Type II or unspecified type diabetes mellitus without mention of complication, not stated as uncontrolled   . Esophageal dysmotility 07/04/2011  . Chronic respiratory failure with hypoxia   . Anxiety   . Paroxysmal atrial fibrillation 08/29/2012  . Partial small bowel obstruction 09/02/2012  . Bilateral lower extremity edema 09/03/2012    Possibly secondary to calcium channel blocker.  . Pneumonia     reports that he has been smoking Cigarettes.  He has a 27.5 pack-year smoking history. He has never used smokeless tobacco. He reports that he drinks alcohol. He reports that he does not use illicit drugs. Family History  Problem Relation Age of Onset  . Lung disease Mother   . Prostate cancer Father    Family History Substance Abuse: Yes, Describe: (alcohol) Family Supports: No Living Arrangements: Alone Can pt return to current living arrangement?: Yes Allergies:   Allergies  Allergen Reactions  . Aspirin Other (See Comments)    Stomach upset     ACT Assessment Complete:  Yes:    Educational Status    Risk to Self: Risk to self Suicidal Ideation: No-Not Currently/Within Last 6 Months Suicidal Intent: No-Not Currently/Within Last 6 Months Is patient at risk for suicide?: No Suicidal Plan?: No-Not Currently/Within Last 6 Months Access to Means: No What has been your use of drugs/alcohol within the last 12 months?:  (etoh) Previous Attempts/Gestures: No How many times?: 0 Other Self Harm Risks: no Triggers for Past Attempts: None known Intentional Self Injurious Behavior: None Family Suicide History: Unknown Recent stressful life event(s): Trauma (Comment) (Pt dealing with PTSD symptoms. ) Persecutory voices/beliefs?: No Depression: Yes Depression Symptoms: Despondent;Feeling angry/irritable Substance abuse history and/or treatment for substance abuse?: No Suicide prevention information given to non-admitted patients: Not applicable  Risk to Others: Risk to Others Homicidal Ideation: No Thoughts of Harm to Others: No Current Homicidal Intent: No Current Homicidal Plan: No Access to Homicidal Means: No Identified Victim:  (none reported) History of harm to others?: No Assessment of Violence: In past 6-12 months Violent Behavior Description: Pt was calm and cooperative during assessment.  Does patient  have access to weapons?: Yes (Comment) (Pt reported having a gun in the home. ) Criminal Charges Pending?: No Does patient have a court date: No  Abuse: Abuse/Neglect Assessment (Assessment to be complete while patient is alone) Physical Abuse: Denies Verbal Abuse: Denies Sexual Abuse: Denies Exploitation of patient/patient's resources: Denies Self-Neglect: Denies  Prior Inpatient Therapy: Prior Inpatient Therapy Prior Inpatient Therapy: Yes Prior Therapy Dates: multiplel;dates unknown Prior Therapy Facilty/Provider([facility]): [facility] ([facility]) Reason for Treatment: substance use, PTSD  Prior Outpatient Therapy:  Prior Outpatient Therapy Prior Outpatient Therapy: Yes Prior Therapy Dates: current [facility] Reason for Treatment: PTSD  Additional Information: Additional Information 1:1 In Past 12 Months?: No CIRT Risk: No Elopement Risk: No Does patient have medical clearance?: Yes      Objective: Blood pressure 130/49, pulse 75, temperature 98.5 F (36.9 C), temperature source Oral, resp. rate 16, SpO2 96.00%.There is no weight on file to calculate BMI. Results for orders placed during the hospital encounter of 02/22/14 (from the past 72 hour(s))  CBG MONITORING, ED     Status: None   Collection Time    02/25/14  8:18 AM      Result Value Ref Range   Glucose-Capillary 97  70 - 99 mg/dL  CBG MONITORING, ED     Status: None   Collection Time    02/26/14  7:15 AM      Result Value Ref Range   Glucose-Capillary 92  70 - 99 mg/dL  CBG MONITORING, ED     Status: Abnormal   Collection Time    02/27/14  7:41 AM      Result Value Ref Range   Glucose-Capillary 108 (*) 70 - 99 mg/dL   Labs are reviewed and are pertinent for Alcohol 142 and the rest are unremarkable..  Current Facility-Administered Medications  Medication Dose Route Frequency Provider Last Rate Last Dose  . acetaminophen (TYLENOL) tablet 650 mg  650 mg Oral Q4H PRN Raeford Razor, MD   650 mg at 02/27/14 0740  . albuterol (PROVENTIL) (2.5 MG/3ML) 0.083% nebulizer solution 3 mL  3 mL Inhalation Q6H PRN Enid Skeens, MD      . alum & mag hydroxide-simeth (MAALOX/MYLANTA) 200-200-20 MG/5ML suspension 30 mL  30 mL Oral PRN Raeford Razor, MD      . LORazepam (ATIVAN) tablet 1 mg  1 mg Oral Q8H PRN Raeford Razor, MD   1 mg at 02/26/14 0726  . metFORMIN (GLUCOPHAGE) tablet 1,000 mg  1,000 mg Oral BID WC Enid Skeens, MD   1,000 mg at 02/27/14 0740  . mometasone-formoterol (DULERA) 200-5 MCG/ACT inhaler 2 puff  2 puff Inhalation BID Enid Skeens, MD   2 puff at 02/27/14 [phone]  . nicotine  (NICODERM CQ - dosed in mg/24 hours) patch 21 mg  21 mg Transdermal Daily Raeford Razor, MD   21 mg at 02/26/14 1050  . ondansetron (ZOFRAN) tablet 4 mg  4 mg Oral Q8H PRN Raeford Razor, MD      . pantoprazole (PROTONIX) EC tablet 40 mg  40 mg Oral Daily Enid Skeens, MD   40 mg at 02/26/14 1048  . PARoxetine (PAXIL) tablet 20 mg  20 mg Oral Daily Enid Skeens, MD   20 mg at 02/26/14 1048  . tamsulosin (FLOMAX) capsule 0.4 mg  0.4 mg Oral QPC supper Enid Skeens, MD   0.4 mg at 02/26/14 1747  . tiotropium (SPIRIVA) inhalation capsule 18 mcg  18 mcg  Inhalation Daily Enid Skeens, MD   18 mcg at 02/27/14 1478   Current Outpatient Prescriptions  Medication Sig Dispense Refill  . albuterol (PROVENTIL HFA;VENTOLIN HFA) 108 (90 BASE) MCG/ACT inhaler Inhale 2 puffs into the lungs every 6 (six) hours as needed for wheezing.      [name] albuterol (PROVENTIL) (2.5 MG/3ML) 0.083% nebulizer solution Take 2.5 mg by nebulization every 6 (six) hours as needed for wheezing or shortness of breath.      . ALPRAZolam (XANAX) 0.25 MG tablet Take 1 tablet by mouth 3 (three) times daily.      . budesonide-formoterol (SYMBICORT) 160-4.5 MCG/ACT inhaler Inhale 2 puffs into the lungs daily.      . furosemide (LASIX) 20 MG tablet Take 20 mg by mouth daily. For swelling in your feet and legs. Take potassium supplement with this medicine.      . metFORMIN (GLUCOPHAGE) 500 MG tablet Take 500 mg by mouth daily.      . OXYGEN-HELIUM IN Inhale 2-3 L into the lungs continuous.      . pantoprazole (PROTONIX) 20 MG tablet Take 20 mg by mouth daily.      [name] PARoxetine (PAXIL) 20 MG tablet Take 1 tablet by mouth daily.      . potassium chloride SA (K-DUR,KLOR-CON) 20 MEQ tablet Take 1 tablet by mouth daily.      . simvastatin (ZOCOR) 20 MG tablet Take 20 mg by mouth at bedtime.      . Tamsulosin HCl (FLOMAX) 0.4 MG CAPS Take 1 capsule (0.4 mg total) by mouth daily after supper.  30 capsule  0  . tiotropium (SPIRIVA) 18 MCG  inhalation capsule Place 18 mcg into inhaler and inhale daily.      . traZODone (DESYREL) 50 MG tablet Take 0.5-1 tablets by mouth daily.       . Vitamin D, Ergocalciferol, (DRISDOL) 50000 UNITS CAPS capsule Take 50,000 Units by mouth every 7 (seven) days.      [name] acetaminophen (TYLENOL) 500 MG tablet Take 1 tablet (500 mg total) by mouth every 4 (four) hours as needed for mild pain.      Terrance Mass Salicylate (ASPERCREME EX) Apply 1 application topically as directed.        Psychiatric Specialty Exam:     Blood pressure 130/49, pulse 75, temperature 98.5 F (36.9 C), temperature source Oral, resp. rate 16, SpO2 96.00%.There is no weight on file to calculate BMI.  General Appearance: Casual and Fairly Groomed  Patent attorney::  Good  Speech:  Clear and Coherent and Normal Rate  Volume:  Normal  Mood:  Anxious  Affect:  Congruent  Thought Process:  Coherent, Goal Directed and Intact  Orientation:  Full (Time, Place, and Person)  Thought Content:  WDL  Suicidal Thoughts:  No  Homicidal Thoughts:  No  Memory:  Immediate;   Good Recent;   Fair Remote;   Fair  Judgement:  Impaired  Insight:  Good  Psychomotor Activity:  Normal  Concentration:  Fair  Recall:  NA  Akathisia:  NA  Handed:  Right  AIMS (if indicated):     Assets:  Desire for Improvement  Sleep:      Treatment Plan Summary: have resumed his medications in the ER  Disposition:  [facility]iatry [facility] Discussed with Dr. Lucianne Muss and Dr. Estell Harpin (EDP).   Disposition Initial Assessment Completed for this Encounter: Yes Disposition of Patient: Followup outpatient with [facility] Type of inpatient treatment program: N/A  Tacey Dimaggio,  Everardo AllJohn C, FNP-BC 02/27/2014 9:11 AM

## 2014-02-27 NOTE — Consult Note (Signed)
Case discussed, and agree with plan 

## 2014-02-27 NOTE — ED Notes (Signed)
Called [facility] for transport home. 

## 2014-02-27 NOTE — ED Notes (Signed)
Called [facility]  To check status of transport. Informed could not give estimate of [place] trucks [place]. Will send truck as soon as possible.

## 2014-02-27 NOTE — Discharge Instructions (Signed)
Follow up with out pt tx as planned

## 2014-02-27 NOTE — Progress Notes (Addendum)
Per Renata Capriceonrad, NP - patient does not meet criteria for inpatient hospitalization. Patient will be discharged.  Per NP Renata CapriceConrad has informed the ER MD.  Patient will follow up with his established outpatient [facility] resources [place].  Writer spoke to Karilyn CotaSarah Kennedy to ensure that the patient has a follow up appointment.  Per Maralyn SagoSarah the patient has to contact the VA facility himself and request an appointment himself.  [place] [facility] [place] would not allow the writer to schedule an appointment on his behalf because he missed an appointment on February 22, 2014.   The patient can call and make an appointment at [phone] ext [phone].

## 2014-05-08 ENCOUNTER — Emergency Department (HOSPITAL_COMMUNITY): Payer: Medicare Other

## 2014-05-08 ENCOUNTER — Emergency Department (HOSPITAL_COMMUNITY)
Admission: EM | Admit: 2014-05-08 | Discharge: 2014-05-09 | Disposition: A | Discharge status: Home / Self Care | Payer: Medicare Other | Attending: Emergency Medicine | Admitting: Emergency Medicine

## 2014-05-08 ENCOUNTER — Encounter (HOSPITAL_COMMUNITY): Payer: Self-pay | Admitting: Emergency Medicine

## 2014-05-08 DIAGNOSIS — R079 Chest pain, unspecified: Secondary | ICD-10-CM | POA: Diagnosis not present

## 2014-05-08 DIAGNOSIS — F4310 Post-traumatic stress disorder, unspecified: Secondary | ICD-10-CM | POA: Diagnosis not present

## 2014-05-08 DIAGNOSIS — Z8701 Personal history of pneumonia (recurrent): Secondary | ICD-10-CM | POA: Insufficient documentation

## 2014-05-08 DIAGNOSIS — G629 Polyneuropathy, unspecified: Secondary | ICD-10-CM | POA: Diagnosis not present

## 2014-05-08 DIAGNOSIS — M1990 Unspecified osteoarthritis, unspecified site: Secondary | ICD-10-CM | POA: Diagnosis not present

## 2014-05-08 DIAGNOSIS — Z79899 Other long term (current) drug therapy: Secondary | ICD-10-CM | POA: Diagnosis not present

## 2014-05-08 DIAGNOSIS — M549 Dorsalgia, unspecified: Secondary | ICD-10-CM | POA: Insufficient documentation

## 2014-05-08 DIAGNOSIS — Z862 Personal history of diseases of the blood and blood-forming organs and certain disorders involving the immune mechanism: Secondary | ICD-10-CM | POA: Insufficient documentation

## 2014-05-08 DIAGNOSIS — I10 Essential (primary) hypertension: Secondary | ICD-10-CM | POA: Insufficient documentation

## 2014-05-08 DIAGNOSIS — F329 Major depressive disorder, single episode, unspecified: Secondary | ICD-10-CM | POA: Diagnosis not present

## 2014-05-08 DIAGNOSIS — I252 Old myocardial infarction: Secondary | ICD-10-CM | POA: Diagnosis not present

## 2014-05-08 DIAGNOSIS — R0602 Shortness of breath: Secondary | ICD-10-CM | POA: Diagnosis present

## 2014-05-08 DIAGNOSIS — E119 Type 2 diabetes mellitus without complications: Secondary | ICD-10-CM | POA: Insufficient documentation

## 2014-05-08 DIAGNOSIS — Z7951 Long term (current) use of inhaled steroids: Secondary | ICD-10-CM | POA: Insufficient documentation

## 2014-05-08 DIAGNOSIS — Z720 Tobacco use: Secondary | ICD-10-CM | POA: Diagnosis not present

## 2014-05-08 DIAGNOSIS — G8929 Other chronic pain: Secondary | ICD-10-CM | POA: Diagnosis not present

## 2014-05-08 DIAGNOSIS — J441 Chronic obstructive pulmonary disease with (acute) exacerbation: Secondary | ICD-10-CM | POA: Insufficient documentation

## 2014-05-08 DIAGNOSIS — R531 Weakness: Secondary | ICD-10-CM | POA: Diagnosis not present

## 2014-05-08 DIAGNOSIS — Z8719 Personal history of other diseases of the digestive system: Secondary | ICD-10-CM | POA: Diagnosis not present

## 2014-05-08 LAB — CBC WITH DIFFERENTIAL/PLATELET
BASOS ABS: 0 10*3/uL (ref 0.0–0.1)
Basophils Relative: 0 % (ref 0–1)
EOS ABS: 0.2 10*3/uL (ref 0.0–0.7)
EOS PCT: 1 % (ref 0–5)
HCT: 27.3 % — ABNORMAL LOW (ref 39.0–52.0)
Hemoglobin: 9.3 g/dL — ABNORMAL LOW (ref 13.0–17.0)
LYMPHS ABS: 2.1 10*3/uL (ref 0.7–4.0)
Lymphocytes Relative: 16 % (ref 12–46)
MCH: 30.4 pg (ref 26.0–34.0)
MCHC: 34.1 g/dL (ref 30.0–36.0)
MCV: 89.2 fL (ref 78.0–100.0)
Monocytes Absolute: 1 10*3/uL (ref 0.1–1.0)
Monocytes Relative: 7 % (ref 3–12)
NEUTROS PCT: 76 % (ref 43–77)
Neutro Abs: 10.3 10*3/uL — ABNORMAL HIGH (ref 1.7–7.7)
PLATELETS: 191 10*3/uL (ref 150–400)
RBC: 3.06 MIL/uL — AB (ref 4.22–5.81)
RDW: 13.9 % (ref 11.5–15.5)
WBC: 13.6 10*3/uL — ABNORMAL HIGH (ref 4.0–10.5)

## 2014-05-08 LAB — COMPREHENSIVE METABOLIC PANEL
ALBUMIN: 3.4 g/dL — AB (ref 3.5–5.2)
ALK PHOS: 84 U/L (ref 39–117)
ALT: 6 U/L (ref 0–53)
AST: 10 U/L (ref 0–37)
Anion gap: 16 — ABNORMAL HIGH (ref 5–15)
BUN: 21 mg/dL (ref 6–23)
CALCIUM: 8.4 mg/dL (ref 8.4–10.5)
CO2: 22 mEq/L (ref 19–32)
Chloride: 97 mEq/L (ref 96–112)
Creatinine, Ser: 1.22 mg/dL (ref 0.50–1.35)
GFR calc Af Amer: 67 mL/min — ABNORMAL LOW (ref >90)
GFR calc non Af Amer: 57 mL/min — ABNORMAL LOW (ref >90)
GLUCOSE: 112 mg/dL — AB (ref 70–99)
POTASSIUM: 3.6 meq/L — AB (ref 3.7–5.3)
SODIUM: 135 meq/L — AB (ref 137–147)
TOTAL PROTEIN: 6.9 g/dL (ref 6.0–8.3)
Total Bilirubin: 0.2 mg/dL — ABNORMAL LOW (ref 0.3–1.2)

## 2014-05-08 LAB — ETHANOL

## 2014-05-08 LAB — LIPASE, BLOOD: Lipase: 50 U/L (ref 11–59)

## 2014-05-08 MED ORDER — SODIUM CHLORIDE 0.9 % IV SOLN
1000.0000 mL | Freq: Once | INTRAVENOUS | Status: AC
  Administered 2014-05-08: 1000 mL via INTRAVENOUS

## 2014-05-08 MED ORDER — SODIUM CHLORIDE 0.9 % IV SOLN
1000.0000 mL | INTRAVENOUS | Status: DC
  Administered 2014-05-08: 1000 mL via INTRAVENOUS

## 2014-05-08 MED ORDER — FOLIC ACID 1 MG PO TABS
1.0000 mg | ORAL_TABLET | Freq: Once | ORAL | Status: AC
  Administered 2014-05-08: 1 mg via ORAL
  Filled 2014-05-08: qty 1

## 2014-05-08 MED ORDER — VITAMIN B-1 100 MG PO TABS
100.0000 mg | ORAL_TABLET | Freq: Once | ORAL | Status: AC
  Administered 2014-05-08: 100 mg via ORAL
  Filled 2014-05-08: qty 1

## 2014-05-08 NOTE — ED Notes (Signed)
Per [facility], [place] PD went to home of patient for a welfare check.  Patient is known to be a drinker and was found without his O2 in feces and urine for unknown time.  [facility] is [facility] and [facility].  Patient c/o shortness of breath; patient was given Albuterol nebulizer x 1 and states he felt better.

## 2014-05-08 NOTE — Discharge Instructions (Signed)

## 2014-05-08 NOTE — ED Provider Notes (Signed)
CSN: [id number]     Arrival date & time 05/08/14  1929 History   This chart was scribed for Brandon CoKevin M Skylar Priest, MD by Gwenyth Oberatherine Macek, ED Scribe. This patient was seen in room [facility] and the patient's care was started at 7:40 PM.    Chief Complaint  Patient presents with  . Shortness of Breath   The history is provided by the patient. No language interpreter was used.   HPI Comments: [name] is a 73 y.o. male brought in by EMS who presents to the Emergency Department complaining of weakness and dehydration. Pt states son called EMS. Pt said he has CP, back pain and SOB as associated symptoms. Per EMS, [place] [employer] went to home of patient for a welfare check. They found pt without his O2 and in feces and urine for unknown time. EMS stated that he had trouble getting up without assistance. Pt has history of 6 bowel surgeries and states chronic back pain.   Past Medical History  Diagnosis Date  . Essential hypertension, benign   . Alcoholism   . PTSD (post-traumatic stress disorder)   . Chronic back pain   . Depression   . Peripheral neuropathy   . DJD (degenerative joint disease)   . COPD (chronic obstructive pulmonary disease)   . Anemia   . Myocardial infarction   . Type II or unspecified type diabetes mellitus without mention of complication, not stated as uncontrolled   . Esophageal dysmotility 07/04/2011  . Chronic respiratory failure with hypoxia   . Anxiety   . Paroxysmal atrial fibrillation 08/29/2012  . Partial small bowel obstruction 09/02/2012  . Bilateral lower extremity edema 09/03/2012    Possibly secondary to calcium channel blocker.  . Pneumonia    Past Surgical History  Procedure Laterality Date  . Hernia repair      X 3  . Spinal surgeries      X 4  . Appendectomy    . Prostate surgery     Family History  Problem Relation Age of Onset  . Lung disease Mother   . Prostate cancer Father    History  Substance Use Topics  . Smoking status: Current  Every Day Smoker -- 0.50 packs/day for 55 years    Types: Cigarettes  . Smokeless tobacco: Never Used     Comment: 1 pack a week  . Alcohol Use: Yes     Comment: 2-3 cases this week per pt.    Review of Systems  Respiratory: Positive for shortness of breath.   Cardiovascular: Positive for chest pain.  Musculoskeletal: Positive for back pain.  Neurological: Positive for weakness.   10 Systems reviewed and all are negative for acute change except as noted in the HPI.   Allergies  Aspirin  Home Medications   Prior to Admission medications   Medication Sig Start Date End Date Taking? Authorizing Provider  acetaminophen (TYLENOL) 500 MG tablet Take 1 tablet (500 mg total) by mouth every 4 (four) hours as needed for mild pain. 10/10/13   Standley Brookinganiel P Goodrich, MD  albuterol (PROVENTIL HFA;VENTOLIN HFA) 108 (90 BASE) MCG/ACT inhaler Inhale 2 puffs into the lungs every 6 (six) hours as needed for wheezing.    Historical Provider, MD  albuterol (PROVENTIL) (2.5 MG/3ML) 0.083% nebulizer solution Take 2.5 mg by nebulization every 6 (six) hours as needed for wheezing or shortness of breath.    Historical Provider, MD  ALPRAZolam Prudy Feeler(XANAX) 0.25 MG tablet Take 1 tablet by mouth 3 (three) times  daily. 05/06/13   Historical Provider, MD  budesonide-formoterol (SYMBICORT) 160-4.5 MCG/ACT inhaler Inhale 2 puffs into the lungs daily.    Historical Provider, MD  furosemide (LASIX) 20 MG tablet Take 20 mg by mouth daily. For swelling in your feet and legs. Take potassium supplement with this medicine. 09/04/12   Elliot Cousin, MD  metFORMIN (GLUCOPHAGE) 500 MG tablet Take 500 mg by mouth daily.    Historical Provider, MD  OXYGEN-HELIUM IN Inhale 2-3 L into the lungs continuous.    Historical Provider, MD  pantoprazole (PROTONIX) 20 MG tablet Take 20 mg by mouth daily.    Historical Provider, MD  PARoxetine (PAXIL) 20 MG tablet Take 1 tablet by mouth daily. 05/23/13   Historical Provider, MD  potassium chloride SA  (K-DUR,KLOR-CON) 20 MEQ tablet Take 1 tablet by mouth daily. 05/31/13   Historical Provider, MD  simvastatin (ZOCOR) 20 MG tablet Take 20 mg by mouth at bedtime.    Historical Provider, MD  Tamsulosin HCl (FLOMAX) 0.4 MG CAPS Take 1 capsule (0.4 mg total) by mouth daily after supper. 08/24/12   Rhetta Mura, MD  tiotropium (SPIRIVA) 18 MCG inhalation capsule Place 18 mcg into inhaler and inhale daily.    Historical Provider, MD  traZODone (DESYREL) 50 MG tablet Take 0.5-1 tablets by mouth daily.  05/23/13   Historical Provider, MD  Trolamine Salicylate (ASPERCREME EX) Apply 1 application topically as directed.    Historical Provider, MD  Vitamin D, Ergocalciferol, (DRISDOL) 50000 UNITS CAPS capsule Take 50,000 Units by mouth every 7 (seven) days.    Historical Provider, MD   BP 137/110  Pulse 78  Temp(Src) 98.4 F (36.9 C) (Oral)  Resp 17  SpO2 94% Physical Exam  Nursing note and vitals reviewed. Constitutional: He is oriented to person, place, and time. He appears well-developed and well-nourished.  HENT:  Head: Normocephalic and atraumatic.  Eyes: EOM are normal.  Neck: Normal range of motion.  Cardiovascular: Normal rate, regular rhythm, normal heart sounds and intact distal pulses.   Pulmonary/Chest: Effort normal and breath sounds normal. No respiratory distress. He has no wheezes.  Course breath sounds bilaterally, no wheezing  Abdominal: Soft. He exhibits no distension. There is no tenderness.  Musculoskeletal: Normal range of motion.  Neurological: He is alert and oriented to person, place, and time.  Skin: Skin is warm and dry.  Psychiatric: He has a normal mood and affect. Judgment normal.    ED Course  Procedures (including critical care time) DIAGNOSTIC STUDIES: Oxygen Saturation is 98% on RA, normal by my interpretation.    COORDINATION OF CARE: 7:50 PM Discussed treatment plan with pt at bedside and pt agreed to plan.  Labs Review Labs Reviewed  CBC WITH  DIFFERENTIAL - Abnormal; Notable for the following:    WBC 13.6 (*)    RBC 3.06 (*)    Hemoglobin 9.3 (*)    HCT 27.3 (*)    Neutro Abs 10.3 (*)    All other components within normal limits  COMPREHENSIVE METABOLIC PANEL - Abnormal; Notable for the following:    Sodium 135 (*)    Potassium 3.6 (*)    Glucose, Bld 112 (*)    Albumin 3.4 (*)    Total Bilirubin 0.2 (*)    GFR calc non Af Amer 57 (*)    GFR calc Af Amer 67 (*)    Anion gap 16 (*)    All other components within normal limits  LIPASE, BLOOD  ETHANOL    Imaging  Review Dg Chest 2 View  05/08/2014   CLINICAL DATA:  Shortness of breath and low back pain with nonproductive cough for 4 weeks; history of hypertension, diabetes, and COPD  EXAM: CHEST  2 VIEW  COMPARISON:  PA and lateral chest of February 24, 2014  FINDINGS: The lungs are mildly hyperinflated and hyperlucent consistent with COPD. There is no focal pneumonia. There is no pleural effusion or pneumothorax. The heart and pulmonary vascularity are within the limits of normal. There is mild loss of height of a mid thoracic vertebral body which is stable.  IMPRESSION: COPD. There is no evidence of CHF nor pneumonia. No acute abnormality of the lumbar spine is demonstrated.   Electronically Signed   By: David  [place]   On: 05/08/2014 20:47     EKG Interpretation None      MDM   Final diagnoses:  Weakness    Generalized weakness.  Discharge home with primary care followup.  No clear cause found.  Vital signs are normal.  Ambulatory.  I personally performed the services described in this documentation, which was scribed in my presence. The recorded information has been reviewed and is accurate.        Brandon Co, MD 05/08/14 [phone]

## 2014-05-09 NOTE — ED Notes (Signed)
Pt became upset upon nurse explaining discharge instructions. Began demanding to speak with the EDP and refusing to leave, stating "im sick and did not come here for this, [facility]  Explained that patient treatment did not require transfer from this facility. He said he did not have a ride and wanted to speak to the EDP.  Called patient son who stated via telephone that he was 4 hours away "in [place] and could not pick him up" Son also stated there was no one else who could come get him. Son said his phone automatically forwarded to him while he was in [place] and apologized for the inconvenience. Edp spoke with patient and explained that he was being discharged and needed to follow up with his primary care provider, pt again was agitated and dismissive of EDP.  Called [facility] service to take patient home was satisfied with this but refused to sign discharge esignarture. Stated he did not feel he was treated "right" stated that we could "sign it yourself".

## 2014-10-12 IMAGING — CR DG CHEST 1V PORT
1 series · 1 of 1 positions shown · non-contrast
Comparison: One-view chest to 08/16/2012.

CLINICAL DATA: Chest pain.  COPD.

PORTABLE CHEST - 1 VIEW

[view not recorded]
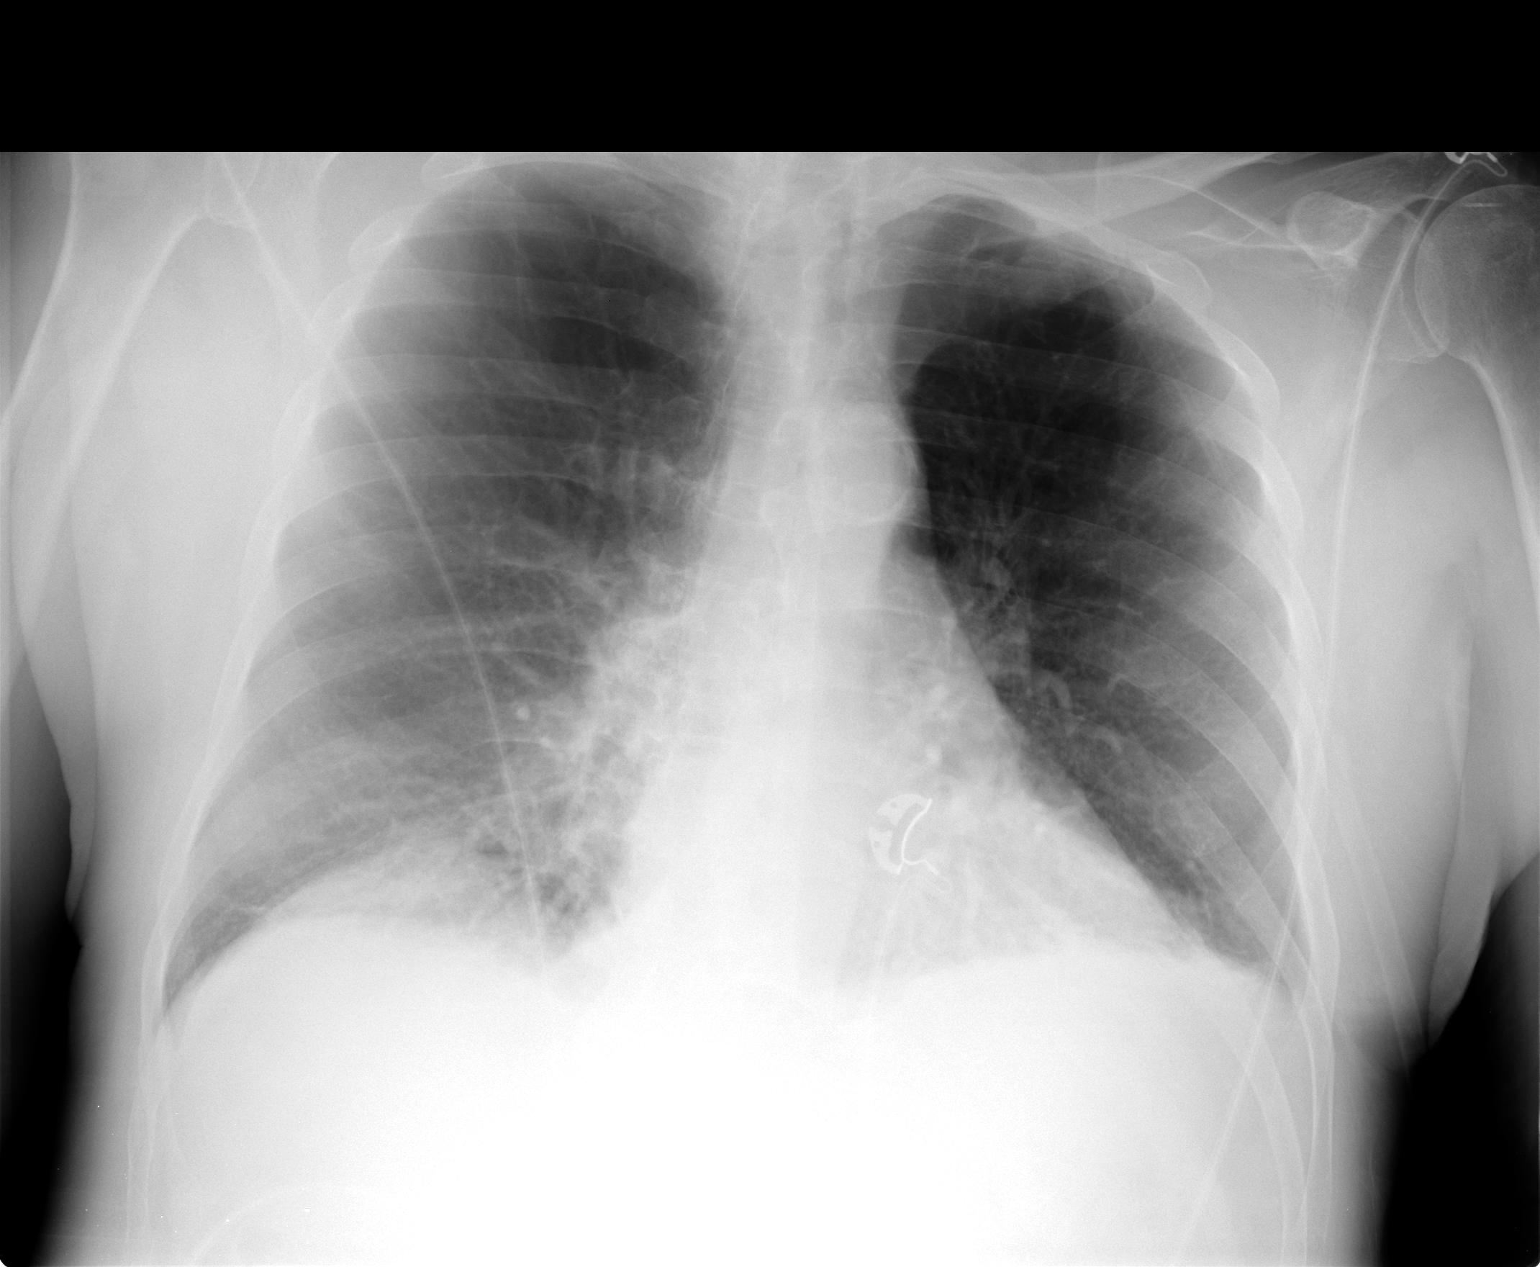

[1 of 1 positions shown; findings below may reference images not displayed]

FINDINGS: The heart size is normal.  There is slight increase in
pulmonary vascular congestion since the prior exam.  Minimal
bibasilar atelectasis is evident.  The upper lung fields are clear.
Emphysematous changes are again noted.
IMPRESSION: 1.  Slight increase in pulmonary vascular congestion and mild
bibasilar atelectasis.
2.  Emphysema.

## 2015-04-11 IMAGING — CR DG KNEE COMPLETE 4+V*L*
4 series · 4 of 4 positions shown · non-contrast
Comparison: None.

CLINICAL DATA: Fall, pain

LEFT KNEE - COMPLETE 4+ VIEW

[view not recorded (1 of 4)]
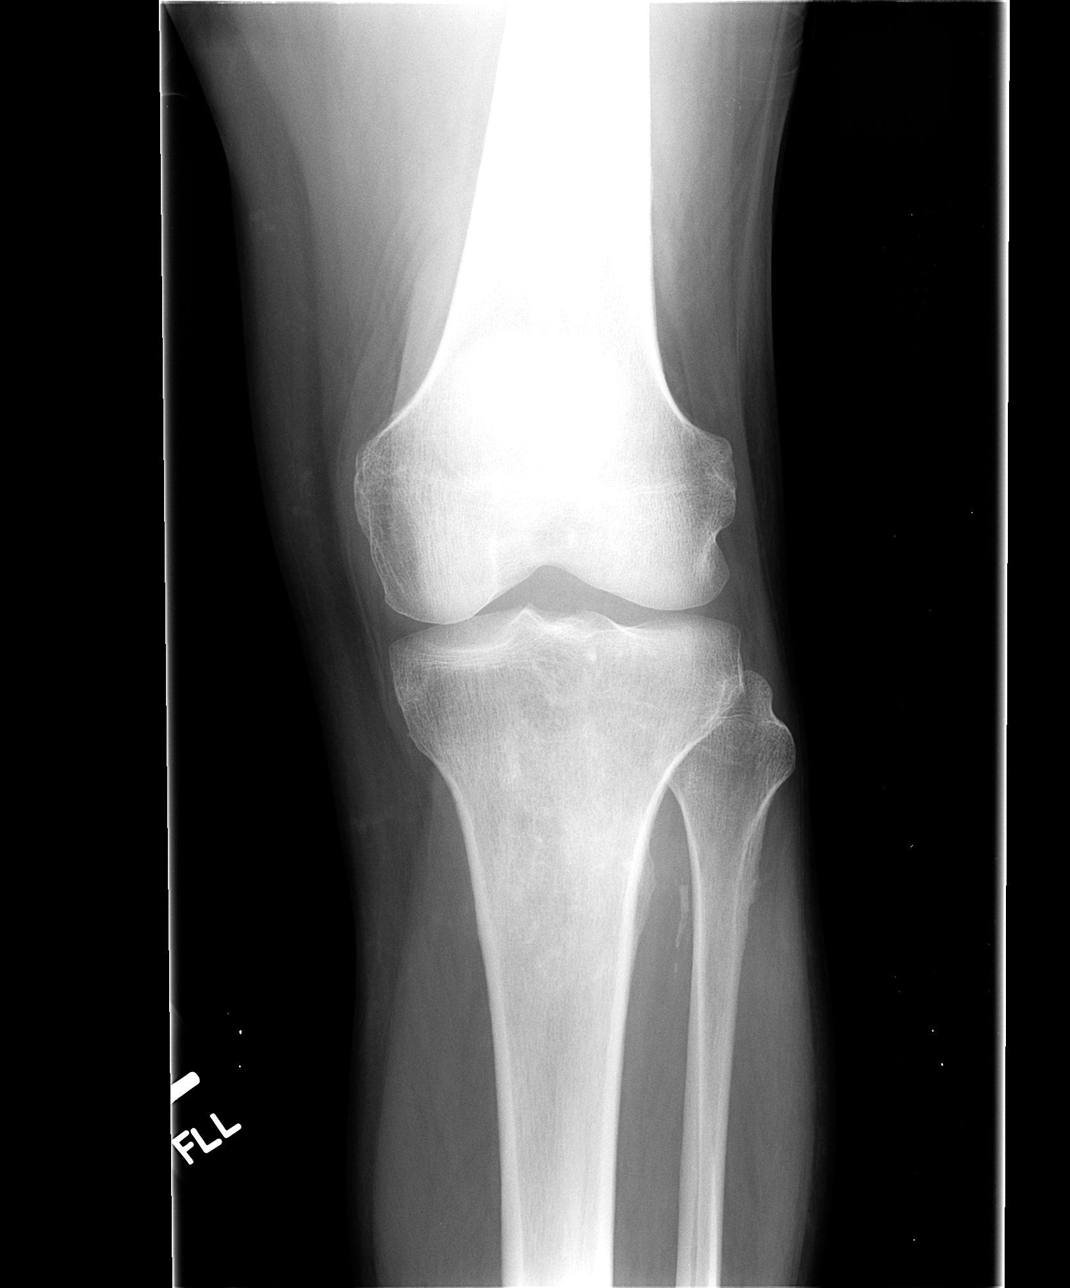

[view not recorded (2 of 4)]
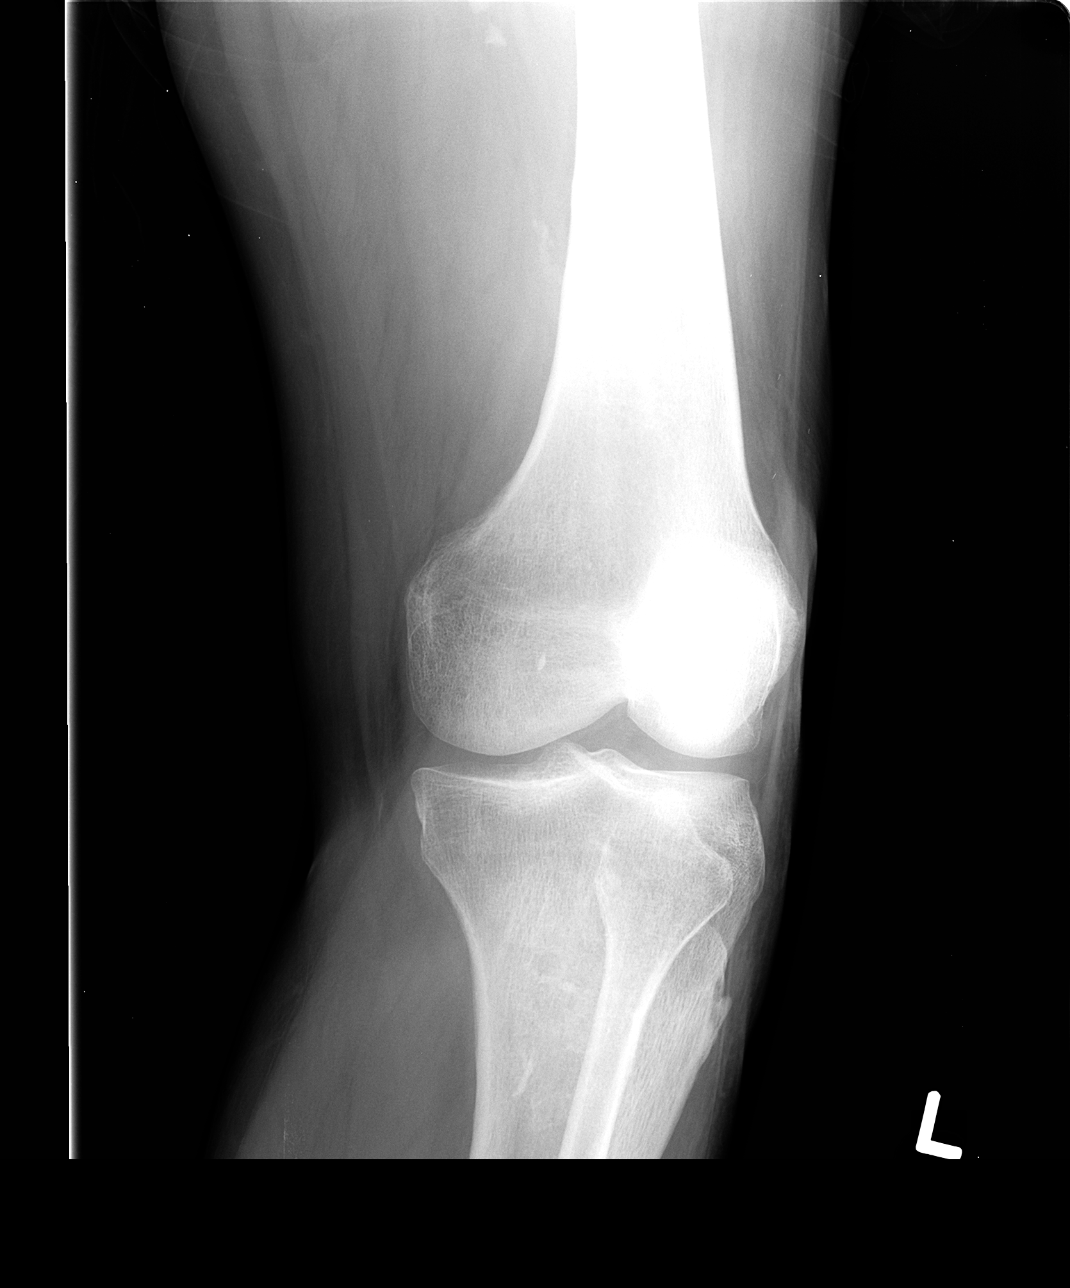

[view not recorded (3 of 4)]
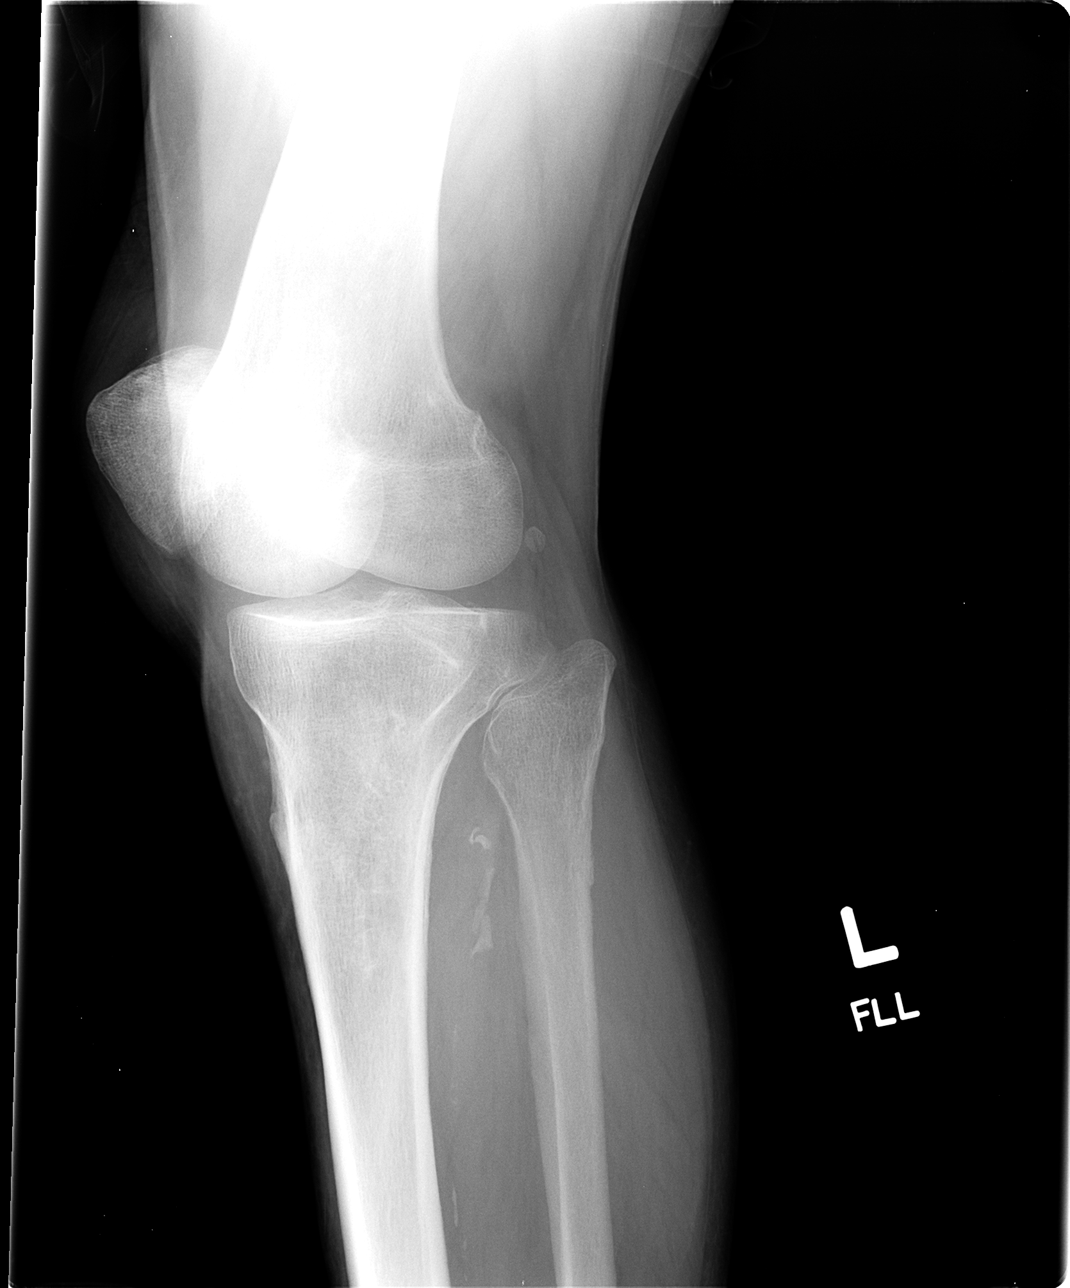

[view not recorded (4 of 4)]
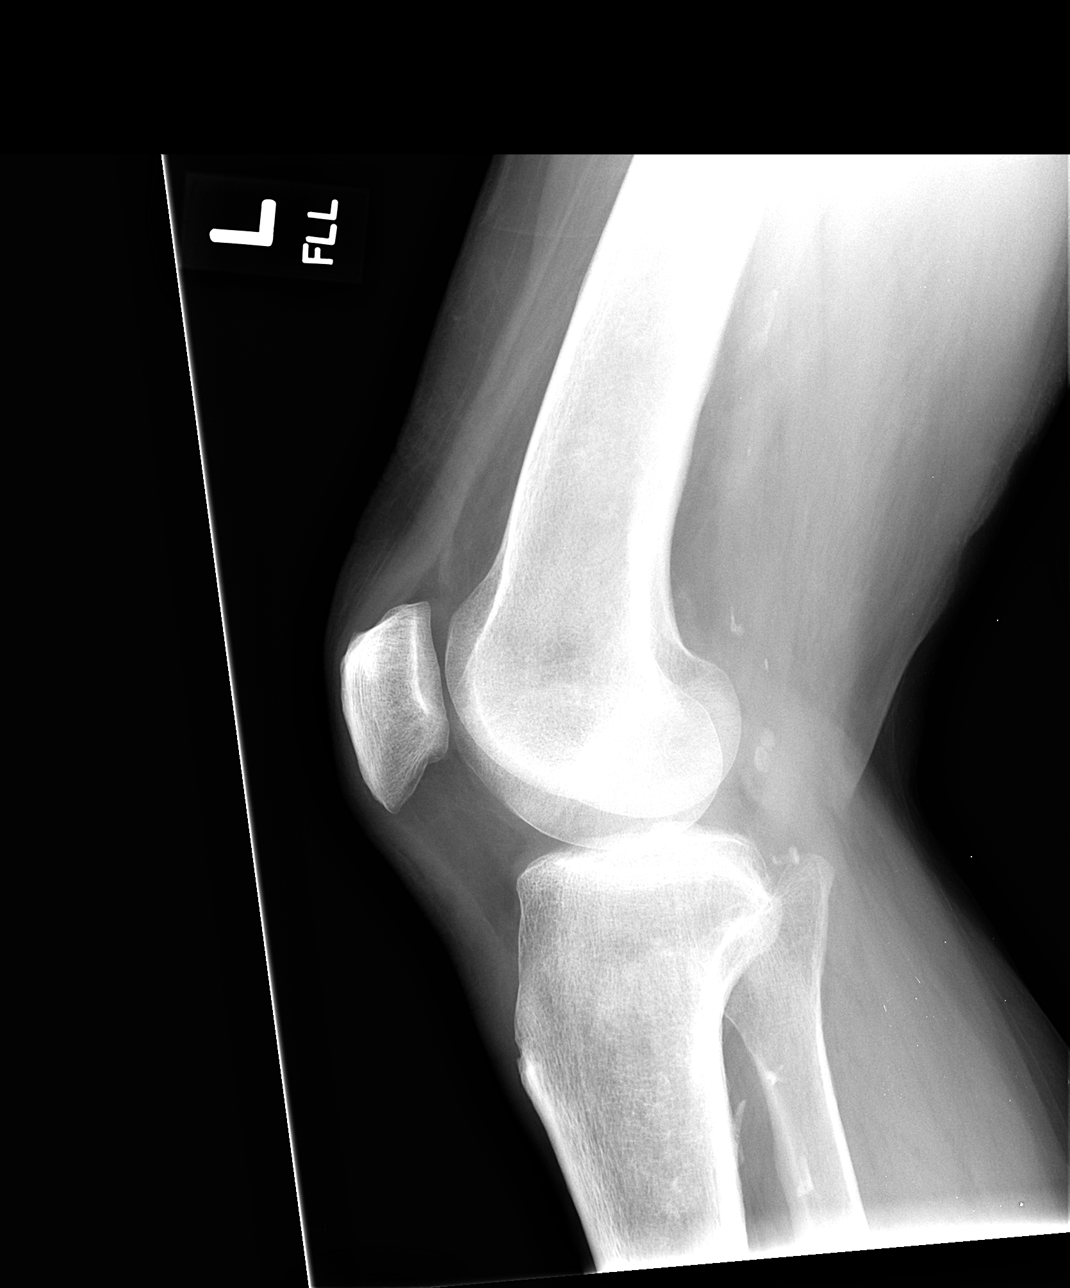

[4 of 4 positions shown; findings below may reference images not displayed]

FINDINGS: No fracture or dislocation.  Patella appears intact
without fracture or subluxation.  No effusion.  Mild vascular
calcification.  Slight anterior patellar soft tissue swelling.
IMPRESSION: No fracture.

## 2015-04-12 IMAGING — CR DG CHEST 1V PORT
1 series · 1 of 1 positions shown · non-contrast
Comparison: 02/15/2013

CLINICAL DATA: Anxiety.  Hypertension.  COPD.

PORTABLE CHEST - 1 VIEW

[portable]
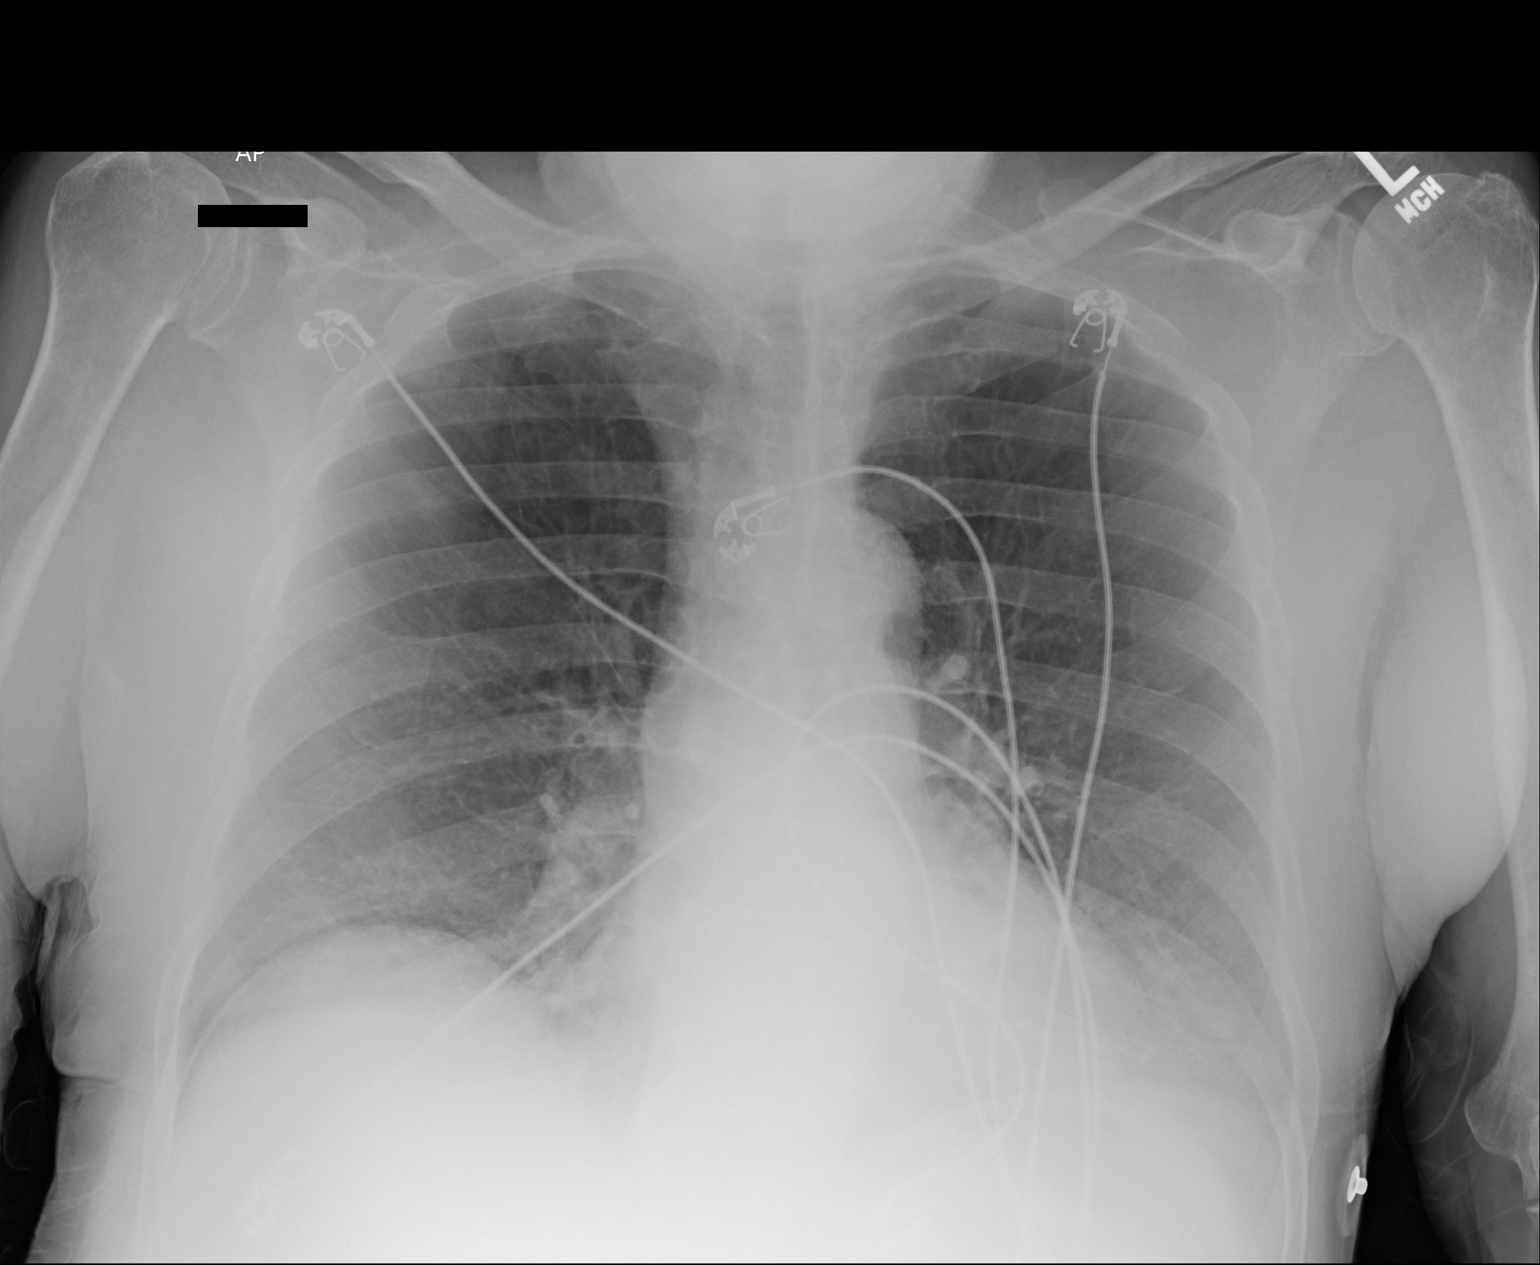

[1 of 1 positions shown; findings below may reference images not displayed]

FINDINGS: Previously demonstrated prominent epicardial fat pad
accounts for the ill definition of the cardiac apex.  Severe
emphysema noted.

Faint right upper lobe density at the intersection of the third and
fifth ribs, favoring subsegmental atelectasis or a small
inflammatory focus.  This area was negative on recent chest
radiographs and on the prior CT from 01/24/2013 and accordingly is
not thought to represent a neoplastic process. Atherosclerotic
calcification in the aortic arch is present.  Thoracic spondylosis
noted.
IMPRESSION: 1.  Severe emphysema with mild airway thickening.
2.  Atherosclerosis.
3.  Faint density projecting over the right upper lobe may
represent a small focus of inflammation or atelectasis.  This was
clear on yesterday's chest radiograph and on the recent chest CT
and accordingly is not thought to represent a neoplastic process.

## 2016-03-02 ENCOUNTER — Encounter (HOSPITAL_COMMUNITY): Payer: Self-pay | Admitting: *Deleted

## 2016-03-02 ENCOUNTER — Emergency Department (HOSPITAL_COMMUNITY): Payer: Medicare Other

## 2016-03-02 ENCOUNTER — Inpatient Hospital Stay (HOSPITAL_COMMUNITY): Payer: Medicare Other

## 2016-03-02 ENCOUNTER — Inpatient Hospital Stay (HOSPITAL_COMMUNITY)
Admission: EM | Admit: 2016-03-02 | Discharge: 2016-03-07 | DRG: 682 | Disposition: A | Discharge status: Skilled Nursing Facility | Payer: Medicare Other | Attending: Internal Medicine | Admitting: Internal Medicine

## 2016-03-02 DIAGNOSIS — R4182 Altered mental status, unspecified: Secondary | ICD-10-CM | POA: Diagnosis present

## 2016-03-02 DIAGNOSIS — I252 Old myocardial infarction: Secondary | ICD-10-CM

## 2016-03-02 DIAGNOSIS — F32A Depression, unspecified: Secondary | ICD-10-CM | POA: Diagnosis present

## 2016-03-02 DIAGNOSIS — J9611 Chronic respiratory failure with hypoxia: Secondary | ICD-10-CM | POA: Diagnosis present

## 2016-03-02 DIAGNOSIS — Z7951 Long term (current) use of inhaled steroids: Secondary | ICD-10-CM | POA: Diagnosis not present

## 2016-03-02 DIAGNOSIS — E870 Hyperosmolality and hypernatremia: Secondary | ICD-10-CM | POA: Diagnosis present

## 2016-03-02 DIAGNOSIS — N179 Acute kidney failure, unspecified: Secondary | ICD-10-CM | POA: Diagnosis present

## 2016-03-02 DIAGNOSIS — I10 Essential (primary) hypertension: Secondary | ICD-10-CM | POA: Diagnosis present

## 2016-03-02 DIAGNOSIS — F17210 Nicotine dependence, cigarettes, uncomplicated: Secondary | ICD-10-CM | POA: Diagnosis present

## 2016-03-02 DIAGNOSIS — R1310 Dysphagia, unspecified: Secondary | ICD-10-CM | POA: Diagnosis present

## 2016-03-02 DIAGNOSIS — F329 Major depressive disorder, single episode, unspecified: Secondary | ICD-10-CM | POA: Diagnosis present

## 2016-03-02 DIAGNOSIS — X58XXXA Exposure to other specified factors, initial encounter: Secondary | ICD-10-CM | POA: Diagnosis present

## 2016-03-02 DIAGNOSIS — G9341 Metabolic encephalopathy: Secondary | ICD-10-CM | POA: Diagnosis present

## 2016-03-02 DIAGNOSIS — G9340 Encephalopathy, unspecified: Secondary | ICD-10-CM | POA: Diagnosis present

## 2016-03-02 DIAGNOSIS — T796XXA Traumatic ischemia of muscle, initial encounter: Secondary | ICD-10-CM | POA: Diagnosis present

## 2016-03-02 DIAGNOSIS — I480 Paroxysmal atrial fibrillation: Secondary | ICD-10-CM | POA: Diagnosis present

## 2016-03-02 DIAGNOSIS — F4310 Post-traumatic stress disorder, unspecified: Secondary | ICD-10-CM | POA: Diagnosis present

## 2016-03-02 DIAGNOSIS — R7989 Other specified abnormal findings of blood chemistry: Secondary | ICD-10-CM | POA: Diagnosis not present

## 2016-03-02 DIAGNOSIS — A419 Sepsis, unspecified organism: Secondary | ICD-10-CM

## 2016-03-02 DIAGNOSIS — Z7984 Long term (current) use of oral hypoglycemic drugs: Secondary | ICD-10-CM | POA: Diagnosis not present

## 2016-03-02 DIAGNOSIS — Z9981 Dependence on supplemental oxygen: Secondary | ICD-10-CM

## 2016-03-02 DIAGNOSIS — D72829 Elevated white blood cell count, unspecified: Secondary | ICD-10-CM | POA: Diagnosis present

## 2016-03-02 DIAGNOSIS — E860 Dehydration: Secondary | ICD-10-CM | POA: Diagnosis present

## 2016-03-02 DIAGNOSIS — L8990 Pressure ulcer of unspecified site, unspecified stage: Secondary | ICD-10-CM | POA: Diagnosis present

## 2016-03-02 DIAGNOSIS — Z8042 Family history of malignant neoplasm of prostate: Secondary | ICD-10-CM

## 2016-03-02 DIAGNOSIS — M6282 Rhabdomyolysis: Secondary | ICD-10-CM | POA: Diagnosis present

## 2016-03-02 DIAGNOSIS — E119 Type 2 diabetes mellitus without complications: Secondary | ICD-10-CM | POA: Diagnosis present

## 2016-03-02 DIAGNOSIS — J449 Chronic obstructive pulmonary disease, unspecified: Secondary | ICD-10-CM | POA: Diagnosis present

## 2016-03-02 DIAGNOSIS — R778 Other specified abnormalities of plasma proteins: Secondary | ICD-10-CM | POA: Diagnosis present

## 2016-03-02 DIAGNOSIS — J431 Panlobular emphysema: Secondary | ICD-10-CM | POA: Diagnosis not present

## 2016-03-02 LAB — BASIC METABOLIC PANEL
ANION GAP: 12 (ref 5–15)
BUN: 111 mg/dL — ABNORMAL HIGH (ref 6–20)
CHLORIDE: 122 mmol/L — AB (ref 101–111)
CO2: 20 mmol/L — ABNORMAL LOW (ref 22–32)
Calcium: 8.7 mg/dL — ABNORMAL LOW (ref 8.9–10.3)
Creatinine, Ser: 3.85 mg/dL — ABNORMAL HIGH (ref 0.61–1.24)
GFR calc Af Amer: 16 mL/min — ABNORMAL LOW (ref >60)
GFR, EST NON AFRICAN AMERICAN: 14 mL/min — AB (ref >60)
GLUCOSE: 149 mg/dL — AB (ref 65–99)
POTASSIUM: 4.3 mmol/L (ref 3.5–5.1)
Sodium: 154 mmol/L — ABNORMAL HIGH (ref 135–145)

## 2016-03-02 LAB — COMPREHENSIVE METABOLIC PANEL
ALT: 33 U/L (ref 17–63)
ANION GAP: 16 — AB (ref 5–15)
AST: 55 U/L — ABNORMAL HIGH (ref 15–41)
Albumin: 4.4 g/dL (ref 3.5–5.0)
Alkaline Phosphatase: 96 U/L (ref 38–126)
BILIRUBIN TOTAL: 1.1 mg/dL (ref 0.3–1.2)
BUN: 112 mg/dL — AB (ref 6–20)
CO2: 18 mmol/L — ABNORMAL LOW (ref 22–32)
Calcium: 9.3 mg/dL (ref 8.9–10.3)
Chloride: 120 mmol/L — ABNORMAL HIGH (ref 101–111)
Creatinine, Ser: 4.1 mg/dL — ABNORMAL HIGH (ref 0.61–1.24)
GFR, EST AFRICAN AMERICAN: 15 mL/min — AB (ref >60)
GFR, EST NON AFRICAN AMERICAN: 13 mL/min — AB (ref >60)
Glucose, Bld: 136 mg/dL — ABNORMAL HIGH (ref 65–99)
POTASSIUM: 4.2 mmol/L (ref 3.5–5.1)
Sodium: 154 mmol/L — ABNORMAL HIGH (ref 135–145)
TOTAL PROTEIN: 8.3 g/dL — AB (ref 6.5–8.1)

## 2016-03-02 LAB — NA AND K (SODIUM & POTASSIUM), RAND UR
POTASSIUM UR: 68 mmol/L
Sodium, Ur: 9 mmol/L

## 2016-03-02 LAB — URINALYSIS, ROUTINE W REFLEX MICROSCOPIC
GLUCOSE, UA: NEGATIVE mg/dL
LEUKOCYTES UA: NEGATIVE
Nitrite: NEGATIVE
PH: 5.5 (ref 5.0–8.0)
Protein, ur: 30 mg/dL — AB

## 2016-03-02 LAB — CBC WITH DIFFERENTIAL/PLATELET
BASOS ABS: 0 10*3/uL (ref 0.0–0.1)
Basophils Relative: 0 %
EOS PCT: 0 %
Eosinophils Absolute: 0.1 10*3/uL (ref 0.0–0.7)
HCT: 42.9 % (ref 39.0–52.0)
Hemoglobin: 14.1 g/dL (ref 13.0–17.0)
LYMPHS PCT: 11 %
Lymphs Abs: 2.5 10*3/uL (ref 0.7–4.0)
MCH: 29.3 pg (ref 26.0–34.0)
MCHC: 32.9 g/dL (ref 30.0–36.0)
MCV: 89.2 fL (ref 78.0–100.0)
MONO ABS: 1.7 10*3/uL — AB (ref 0.1–1.0)
Monocytes Relative: 7 %
Neutro Abs: 18.4 10*3/uL — ABNORMAL HIGH (ref 1.7–7.7)
Neutrophils Relative %: 82 %
PLATELETS: 306 10*3/uL (ref 150–400)
RBC: 4.81 MIL/uL (ref 4.22–5.81)
RDW: 15.6 % — AB (ref 11.5–15.5)
WBC: 22.6 10*3/uL — ABNORMAL HIGH (ref 4.0–10.5)

## 2016-03-02 LAB — LACTIC ACID, PLASMA
LACTIC ACID, VENOUS: 1.5 mmol/L (ref 0.5–1.9)
LACTIC ACID, VENOUS: 0.9 mmol/L (ref 0.5–1.9)

## 2016-03-02 LAB — URINE MICROSCOPIC-ADD ON: Squamous Epithelial / LPF: NONE SEEN

## 2016-03-02 LAB — RAPID URINE DRUG SCREEN, HOSP PERFORMED
AMPHETAMINES: NOT DETECTED
BARBITURATES: NOT DETECTED
BENZODIAZEPINES: POSITIVE — AB
Cocaine: NOT DETECTED
Opiates: NOT DETECTED
TETRAHYDROCANNABINOL: NOT DETECTED

## 2016-03-02 LAB — TSH: TSH: 1.528 u[IU]/mL (ref 0.350–4.500)

## 2016-03-02 LAB — PROCALCITONIN: Procalcitonin: 0.44 ng/mL

## 2016-03-02 LAB — GLUCOSE, CAPILLARY: GLUCOSE-CAPILLARY: 100 mg/dL — AB (ref 65–99)

## 2016-03-02 LAB — TROPONIN I
TROPONIN I: 0.13 ng/mL — AB (ref <0.03)
Troponin I: 0.14 ng/mL (ref <0.03)

## 2016-03-02 LAB — PROTIME-INR
INR: 1.13
Prothrombin Time: 14.6 seconds (ref 11.4–15.2)

## 2016-03-02 LAB — MRSA PCR SCREENING: MRSA BY PCR: NEGATIVE

## 2016-03-02 LAB — ETHANOL

## 2016-03-02 LAB — SALICYLATE LEVEL: Salicylate Lvl: <4 mg/dL (ref 2.8–30.0)

## 2016-03-02 LAB — AMMONIA: AMMONIA: 18 umol/L (ref 9–35)

## 2016-03-02 LAB — ACETAMINOPHEN LEVEL

## 2016-03-02 LAB — CK: CK TOTAL: 2189 U/L — AB (ref 49–397)

## 2016-03-02 LAB — CREATININE, URINE, RANDOM: CREATININE, URINE: 227.35 mg/dL

## 2016-03-02 MED ORDER — TIOTROPIUM BROMIDE MONOHYDRATE 18 MCG IN CAPS
18.0000 ug | ORAL_CAPSULE | Freq: Every day | RESPIRATORY_TRACT | Status: DC
  Administered 2016-03-02 – 2016-03-07 (×5): 18 ug via RESPIRATORY_TRACT
  Filled 2016-03-02: qty 5

## 2016-03-02 MED ORDER — METOPROLOL TARTRATE 5 MG/5ML IV SOLN
5.0000 mg | Freq: Once | INTRAVENOUS | Status: AC
  Administered 2016-03-02: 5 mg via INTRAVENOUS
  Filled 2016-03-02: qty 5

## 2016-03-02 MED ORDER — PIPERACILLIN-TAZOBACTAM 3.375 G IVPB 30 MIN
3.3750 g | Freq: Once | INTRAVENOUS | Status: AC
  Administered 2016-03-02: 3.375 g via INTRAVENOUS
  Filled 2016-03-02 (×2): qty 50

## 2016-03-02 MED ORDER — INSULIN ASPART 100 UNIT/ML ~~LOC~~ SOLN
0.0000 [IU] | SUBCUTANEOUS | Status: DC
  Administered 2016-03-03 – 2016-03-05 (×2): 1 [IU] via SUBCUTANEOUS
  Administered 2016-03-05 (×2): 2 [IU] via SUBCUTANEOUS
  Administered 2016-03-07 (×2): 1 [IU] via SUBCUTANEOUS

## 2016-03-02 MED ORDER — SODIUM CHLORIDE 0.9 % IV BOLUS (SEPSIS): 1000.0000 mL | Freq: Once | INTRAVENOUS | Status: DC

## 2016-03-02 MED ORDER — SODIUM CHLORIDE 0.9 % IV BOLUS (SEPSIS)
500.0000 mL | Freq: Once | INTRAVENOUS | Status: AC
  Administered 2016-03-02: 500 mL via INTRAVENOUS

## 2016-03-02 MED ORDER — ACETAMINOPHEN 500 MG PO TABS: 500.0000 mg | ORAL_TABLET | ORAL | Status: DC | PRN

## 2016-03-02 MED ORDER — SODIUM CHLORIDE 0.9 % IV SOLN: INTRAVENOUS | Status: DC

## 2016-03-02 MED ORDER — TAMSULOSIN HCL 0.4 MG PO CAPS
0.4000 mg | ORAL_CAPSULE | Freq: Every day | ORAL | Status: DC
  Administered 2016-03-04 – 2016-03-06 (×3): 0.4 mg via ORAL
  Filled 2016-03-02 (×3): qty 1

## 2016-03-02 MED ORDER — PAROXETINE HCL 20 MG PO TABS
20.0000 mg | ORAL_TABLET | Freq: Every day | ORAL | Status: DC
  Administered 2016-03-03 – 2016-03-07 (×4): 20 mg via ORAL
  Filled 2016-03-02 (×5): qty 1

## 2016-03-02 MED ORDER — THIAMINE HCL 100 MG/ML IJ SOLN
100.0000 mg | Freq: Every day | INTRAMUSCULAR | Status: DC
  Administered 2016-03-03 – 2016-03-06 (×4): 100 mg via INTRAVENOUS
  Filled 2016-03-02 (×5): qty 2

## 2016-03-02 MED ORDER — MOMETASONE FURO-FORMOTEROL FUM 200-5 MCG/ACT IN AERO
INHALATION_SPRAY | RESPIRATORY_TRACT | Status: AC
  Filled 2016-03-02: qty 8.8

## 2016-03-02 MED ORDER — SODIUM CHLORIDE 0.45 % IV SOLN
INTRAVENOUS | Status: AC
  Administered 2016-03-02: 20:00:00 via INTRAVENOUS

## 2016-03-02 MED ORDER — ASPIRIN 300 MG RE SUPP: 300.0000 mg | RECTAL | Status: AC

## 2016-03-02 MED ORDER — ENOXAPARIN SODIUM 40 MG/0.4ML ~~LOC~~ SOLN: 40.0000 mg | SUBCUTANEOUS | Status: DC

## 2016-03-02 MED ORDER — VANCOMYCIN HCL IN DEXTROSE 1-5 GM/200ML-% IV SOLN: 1000.0000 mg | INTRAVENOUS | Status: DC

## 2016-03-02 MED ORDER — ACETAMINOPHEN 325 MG PO TABS: 650.0000 mg | ORAL_TABLET | ORAL | Status: DC | PRN

## 2016-03-02 MED ORDER — PIPERACILLIN SOD-TAZOBACTAM SO 2.25 (2-0.25) G IV SOLR
2.2500 g | Freq: Four times a day (QID) | INTRAVENOUS | Status: DC
  Administered 2016-03-03: 2.25 g via INTRAVENOUS
  Filled 2016-03-02 (×6): qty 2.25

## 2016-03-02 MED ORDER — ASPIRIN EC 81 MG PO TBEC
81.0000 mg | DELAYED_RELEASE_TABLET | Freq: Every day | ORAL | Status: DC
  Administered 2016-03-03 – 2016-03-07 (×4): 81 mg via ORAL
  Filled 2016-03-02 (×5): qty 1

## 2016-03-02 MED ORDER — LORAZEPAM 2 MG/ML IJ SOLN
2.0000 mg | INTRAMUSCULAR | Status: DC | PRN
  Administered 2016-03-03: 2 mg via INTRAVENOUS
  Filled 2016-03-02: qty 1

## 2016-03-02 MED ORDER — ONDANSETRON HCL 4 MG/2ML IJ SOLN: 4.0000 mg | Freq: Four times a day (QID) | INTRAMUSCULAR | Status: DC | PRN

## 2016-03-02 MED ORDER — VANCOMYCIN HCL IN DEXTROSE 1-5 GM/200ML-% IV SOLN
1000.0000 mg | Freq: Once | INTRAVENOUS | Status: AC
  Administered 2016-03-02: 1000 mg via INTRAVENOUS
  Filled 2016-03-02: qty 200

## 2016-03-02 MED ORDER — SODIUM CHLORIDE 0.9 % IV BOLUS (SEPSIS): 500.0000 mL | Freq: Once | INTRAVENOUS | Status: DC

## 2016-03-02 MED ORDER — SODIUM CHLORIDE 0.9 % IV SOLN
INTRAVENOUS | Status: DC
  Administered 2016-03-02: 16:00:00 via INTRAVENOUS

## 2016-03-02 MED ORDER — VITAMIN D (ERGOCALCIFEROL) 1.25 MG (50000 UNIT) PO CAPS
50000.0000 [IU] | ORAL_CAPSULE | ORAL | Status: DC
  Administered 2016-03-03: 50000 [IU] via ORAL
  Filled 2016-03-02 (×2): qty 1

## 2016-03-02 MED ORDER — IPRATROPIUM-ALBUTEROL 0.5-2.5 (3) MG/3ML IN SOLN: 3.0000 mL | RESPIRATORY_TRACT | Status: DC | PRN

## 2016-03-02 MED ORDER — FOLIC ACID 5 MG/ML IJ SOLN
1.0000 mg | Freq: Every day | INTRAMUSCULAR | Status: DC
  Administered 2016-03-03 – 2016-03-06 (×5): 1 mg via INTRAVENOUS
  Filled 2016-03-02 (×9): qty 0.2

## 2016-03-02 MED ORDER — TIOTROPIUM BROMIDE MONOHYDRATE 18 MCG IN CAPS
ORAL_CAPSULE | RESPIRATORY_TRACT | Status: AC
  Filled 2016-03-02: qty 5

## 2016-03-02 MED ORDER — MOMETASONE FURO-FORMOTEROL FUM 200-5 MCG/ACT IN AERO
2.0000 | INHALATION_SPRAY | Freq: Two times a day (BID) | RESPIRATORY_TRACT | Status: DC
  Administered 2016-03-02 – 2016-03-07 (×9): 2 via RESPIRATORY_TRACT
  Filled 2016-03-02: qty 8.8

## 2016-03-02 MED ORDER — ALPRAZOLAM 0.25 MG PO TABS
0.2500 mg | ORAL_TABLET | Freq: Three times a day (TID) | ORAL | Status: DC
  Administered 2016-03-03 – 2016-03-07 (×9): 0.25 mg via ORAL
  Filled 2016-03-02 (×10): qty 1

## 2016-03-02 MED ORDER — HEPARIN SODIUM (PORCINE) 5000 UNIT/ML IJ SOLN
5000.0000 [IU] | Freq: Three times a day (TID) | INTRAMUSCULAR | Status: DC
  Administered 2016-03-03 – 2016-03-07 (×13): 5000 [IU] via SUBCUTANEOUS
  Filled 2016-03-02 (×13): qty 1

## 2016-03-02 MED ORDER — PANTOPRAZOLE SODIUM 20 MG PO TBEC
20.0000 mg | DELAYED_RELEASE_TABLET | Freq: Every day | ORAL | Status: DC
  Filled 2016-03-02: qty 1

## 2016-03-02 MED ORDER — FOLIC ACID 5 MG/ML IJ SOLN
INTRAMUSCULAR | Status: AC
  Filled 2016-03-02: qty 0.2

## 2016-03-02 MED ORDER — METOPROLOL TARTRATE 25 MG PO TABS
12.5000 mg | ORAL_TABLET | Freq: Two times a day (BID) | ORAL | Status: DC
  Administered 2016-03-03 – 2016-03-07 (×8): 12.5 mg via ORAL
  Filled 2016-03-02 (×9): qty 1

## 2016-03-02 MED ORDER — IPRATROPIUM-ALBUTEROL 0.5-2.5 (3) MG/3ML IN SOLN
3.0000 mL | Freq: Once | RESPIRATORY_TRACT | Status: AC
  Administered 2016-03-02: 3 mL via RESPIRATORY_TRACT
  Filled 2016-03-02: qty 3

## 2016-03-02 MED ORDER — ASPIRIN 81 MG PO CHEW: 324.0000 mg | CHEWABLE_TABLET | ORAL | Status: AC

## 2016-03-02 MED ORDER — NITROGLYCERIN 0.4 MG SL SUBL: 0.4000 mg | SUBLINGUAL_TABLET | SUBLINGUAL | Status: DC | PRN

## 2016-03-02 NOTE — ED Notes (Addendum)
CRITICAL VALUE ALERT  Critical value received:  Troponin 0.14  Date of notification:  03/02/16  Time of notification:  1545  Critical value read back:Yes.    Nurse who received alert:  Tilman Neat  MD notified (1st page):  Dr.McManus  Time of first page:  1644  MD notified (2nd page):  Time of second page:  Responding MD:  Dr. Clarene Duke  Time MD responded:  [phone]

## 2016-03-02 NOTE — Progress Notes (Signed)
Pharmacy Antibiotic Note  FARHAN LEHN is a 75 y.o. male admitted on 03/02/2016 with sepsis.  Pharmacy has been consulted for Vancomycin and zosyn dosing.  Plan: Vancomycin 1000mg  IV every 48 hours.  Goal trough 15-20 mcg/mL. Zosyn 2.25 IV every 6 hours F/U cultures and labs Monitor V/S Check vancomycin trough levels as indicated   Height: 5\' 8"  (172.7 cm) Weight: 138 lb 10.7 oz (62.9 kg) IBW/kg (Calculated) : 68.4  Temp (24hrs), Avg:98.2 F (36.8 C), Min:98.2 F (36.8 C), Max:98.2 F (36.8 C)  Recent Labs Lab 03/02/16 1549  WBC 22.6*  CREATININE 4.10*  LATICACIDVEN 1.5    Estimated Creatinine Clearance: 14.3 mL/min (by C-G formula based on SCr of 4.1 mg/dL).    Allergies  Allergen Reactions  . Aspirin Other (See Comments)    Stomach upset    Antimicrobials this admission: Vancomycin 7/30 >>  Zosyn 7/30 >>   Dose adjustments this admission: n/a  Microbiology results: 7/30 BCx: pending 7/30 MRSA PCR: pending  Thank you for allowing pharmacy to be a part of this patient's care.  Elder Cyphers, BS Pharm D, [place] Clinical Pharmacist Pager [phone] 03/02/2016 7:30 PM

## 2016-03-02 NOTE — H&P (Signed)
History and Physical    [name]:[id number] DOB: Oct 26, 1940 DOA: 03/02/2016  PCP: Josue Hector, MD   Patient coming from: Home   Chief Complaint: Found down   HPI: [name] is a 75 y.o. male with medical history significant for alcoholism, depression, anxiety, COPD with chronic hypoxic respiratory failure, and hypertension who presents to the emergency department after being found down at his home by a friend. Patient's friend reports that he had not seen or heard from the patient and at least one month and went to his house to check on him today. He reportedly found the patient lying on the floor under his hospital bed with his O2 canister tipped over. Patient's friend initially thought that the patient had expired, however, he opened his eyes to stimulation. Per EMS report, he was found to be soiled in urine and appeared to been down for at least a day. There were no signs of trauma.  ED Course: Upon arrival to the ED, patient is found to be afebrile, saturating well on 2 L/m supplemental oxygen, tachycardic in the 110s, and with stable blood pressure. EKG demonstrates a sinus tachycardia with anteroseptal Q waves and nonspecific ST changes diffusely. Chest x-ray is negative for acute cardiopulmonary disease. CMP is notable for sodium of 154, chloride 120, and serum creatinine of 4.10, up from 1.2 two years prior. CBC features a leukocytosis to 22,600. Urinalysis features and elevated specific gravity but is otherwise unremarkable. UDS is positive for benzodiazepines only, ethanol level was undetectable, salicylates and Tylenol levels are also undetectable. Troponin returned elevated to a value of 0.14 and INR is normal. Lactic acid is reassuring at 1.5. Serum CK is elevated to 2189. Patient was given 2 L of normal saline and DuoNeb in the emergency department and urine culture was sent. He remained mildly tachycardic, but was stable blood pressure in the ED. He remained  nonverbal in the emergency department but there were no focal deficits observed. He'll be admitted to the stepdown unit for ongoing evaluation and management of acute encephalopathy with rhabdomyolysis and acute renal failure, as well as possible sepsis with unknown source and elevated troponin concerning for possible ACS.  Review of Systems:  Unable to obtain ROS secondary to patient's clinical condition with acute encephalopathy.  Past Medical History:  Diagnosis Date  . Alcoholism (HCC)   . Anemia   . Anxiety   . Bilateral lower extremity edema 09/03/2012   Possibly secondary to calcium channel blocker.  . Chronic back pain   . Chronic respiratory failure with hypoxia (HCC)   . COPD (chronic obstructive pulmonary disease) (HCC)   . Depression   . DJD (degenerative joint disease)   . Esophageal dysmotility 07/04/2011  . Essential hypertension, benign   . Myocardial infarction (HCC)   . Paroxysmal atrial fibrillation (HCC) 08/29/2012  . Partial small bowel obstruction (HCC) 09/02/2012  . Peripheral neuropathy (HCC)   . Pneumonia   . PTSD (post-traumatic stress disorder)   . Type II or unspecified type diabetes mellitus without mention of complication, not stated as uncontrolled     Past Surgical History:  Procedure Laterality Date  . APPENDECTOMY    . HERNIA REPAIR     X 3  . PROSTATE SURGERY    . Spinal surgeries     X 4     reports that he has been smoking Cigarettes.  He has a 27.50 pack-year smoking history. He has never used smokeless tobacco. He reports that he drinks  alcohol. He reports that he does not use drugs.  Allergies  Allergen Reactions  . Aspirin Other (See Comments)    Stomach upset    Family History  Problem Relation Age of Onset  . Lung disease Mother   . Prostate cancer Father      Prior to Admission medications   Medication Sig Start Date End Date Taking? Authorizing Provider  acetaminophen (TYLENOL) 500 MG tablet Take 1 tablet (500 mg total)  by mouth every 4 (four) hours as needed for mild pain. 10/10/13   Standley Brooking, MD  albuterol (PROVENTIL HFA;VENTOLIN HFA) 108 (90 BASE) MCG/ACT inhaler Inhale 2 puffs into the lungs every 6 (six) hours as needed for wheezing.    Historical Provider, MD  albuterol (PROVENTIL) (2.5 MG/3ML) 0.083% nebulizer solution Take 2.5 mg by nebulization every 6 (six) hours as needed for wheezing or shortness of breath.    Historical Provider, MD  ALPRAZolam Prudy Feeler) 0.25 MG tablet Take 1 tablet by mouth 3 (three) times daily. 05/06/13   Historical Provider, MD  budesonide-formoterol (SYMBICORT) 160-4.5 MCG/ACT inhaler Inhale 2 puffs into the lungs daily.    Historical Provider, MD  furosemide (LASIX) 20 MG tablet Take 20 mg by mouth daily. For swelling in your feet and legs. Take potassium supplement with this medicine. 09/04/12   Elliot Cousin, MD  metFORMIN (GLUCOPHAGE) 500 MG tablet Take 500 mg by mouth daily.    Historical Provider, MD  OXYGEN-HELIUM IN Inhale 2-3 L into the lungs continuous.    Historical Provider, MD  pantoprazole (PROTONIX) 20 MG tablet Take 20 mg by mouth daily.    Historical Provider, MD  PARoxetine (PAXIL) 20 MG tablet Take 1 tablet by mouth daily. 05/23/13   Historical Provider, MD  potassium chloride SA (K-DUR,KLOR-CON) 20 MEQ tablet Take 1 tablet by mouth daily. 05/31/13   Historical Provider, MD  simvastatin (ZOCOR) 20 MG tablet Take 20 mg by mouth at bedtime.    Historical Provider, MD  Tamsulosin HCl (FLOMAX) 0.4 MG CAPS Take 1 capsule (0.4 mg total) by mouth daily after supper. 08/24/12   Rhetta Mura, MD  tiotropium (SPIRIVA) 18 MCG inhalation capsule Place 18 mcg into inhaler and inhale daily.    Historical Provider, MD  traZODone (DESYREL) 50 MG tablet Take 0.5-1 tablets by mouth daily.  05/23/13   Historical Provider, MD  Trolamine Salicylate (ASPERCREME EX) Apply 1 application topically as directed.    Historical Provider, MD  Vitamin D, Ergocalciferol, (DRISDOL) 50000  UNITS CAPS capsule Take 50,000 Units by mouth every 7 (seven) days.    Historical Provider, MD    Physical Exam: Vitals:   03/02/16 1715 03/02/16 1730 03/02/16 1805 03/02/16 1900  BP:  192/73 (!) 179/80 (!) 169/74  Pulse: 110   100  Resp: 22 20 (!) 21 20  Temp:   98.2 F (36.8 C)   TempSrc:   Axillary   SpO2: 98%   97%  Weight:   62.9 kg (138 lb 10.7 oz)   Height:   5\' 8"  (1.727 m)       Constitutional: NAD, calm, cachectic Eyes: PERTLA, lids and conjunctivae normal ENMT: Mucous membranes are dry. Posterior pharynx clear of any exudate or lesions.   Neck: normal, supple, no masses, no thyromegaly Respiratory: Clear to auscultation bilaterally, no wheezing, no crackles. Normal respiratory effort.   Cardiovascular: Rate ~120 and regular. No extremity edema. No carotid bruits. No significant JVD. Abdomen: No distension, no tenderness, no masses palpated. Bowel sounds normal.  Musculoskeletal: no clubbing / cyanosis. No joint deformity upper and lower extremities. Normal muscle tone.  Skin: no significant rashes, lesions, ulcers. Warm, dry, well-perfused. Neurologic: Opens eyes to voice and follows some simple commands, but not responding verbally. No gross facial asymmetry, PERRL, EOMI, moves all extremities equally, Babinski down-going bilaterally.  Psychiatric: Lethargic, difficult to assess otherwise given the clinical scenario.     Labs on Admission: I have personally reviewed following labs and imaging studies  CBC:  Recent Labs Lab 03/02/16 1549  WBC 22.6*  NEUTROABS 18.4*  HGB 14.1  HCT 42.9  MCV 89.2  PLT 306   Basic Metabolic Panel:  Recent Labs Lab 03/02/16 1549  NA 154*  K 4.2  CL 120*  CO2 18*  GLUCOSE 136*  BUN 112*  CREATININE 4.10*  CALCIUM 9.3   GFR: Estimated Creatinine Clearance: 14.3 mL/min (by C-G formula based on SCr of 4.1 mg/dL). Liver Function Tests:  Recent Labs Lab 03/02/16 1549  AST 55*  ALT 33  ALKPHOS 96  BILITOT 1.1    PROT 8.3*  ALBUMIN 4.4   No results for input(s): LIPASE, AMYLASE in the last 168 hours. No results for input(s): AMMONIA in the last 168 hours. Coagulation Profile:  Recent Labs Lab 03/02/16 1549  INR 1.13   Cardiac Enzymes:  Recent Labs Lab 03/02/16 1549  CKTOTAL 2,189*  TROPONINI 0.14*   BNP (last 3 results) No results for input(s): PROBNP in the last 8760 hours. HbA1C: No results for input(s): HGBA1C in the last 72 hours. CBG: No results for input(s): GLUCAP in the last 168 hours. Lipid Profile: No results for input(s): CHOL, HDL, LDLCALC, TRIG, CHOLHDL, LDLDIRECT in the last 72 hours. Thyroid Function Tests: No results for input(s): TSH, T4TOTAL, FREET4, T3FREE, THYROIDAB in the last 72 hours. Anemia Panel: No results for input(s): VITAMINB12, FOLATE, FERRITIN, TIBC, IRON, RETICCTPCT in the last 72 hours. Urine analysis:    Component Value Date/Time   COLORURINE YELLOW 03/02/2016 1520   APPEARANCEUR CLEAR 03/02/2016 1520   LABSPEC >1.030 (H) 03/02/2016 1520   PHURINE 5.5 03/02/2016 1520   GLUCOSEU NEGATIVE 03/02/2016 1520   HGBUR MODERATE (A) 03/02/2016 1520   BILIRUBINUR MODERATE (A) 03/02/2016 1520   KETONESUR TRACE (A) 03/02/2016 1520   PROTEINUR 30 (A) 03/02/2016 1520   UROBILINOGEN 0.2 02/22/2014 1750   NITRITE NEGATIVE 03/02/2016 1520   LEUKOCYTESUR NEGATIVE 03/02/2016 1520   Sepsis Labs: @LABRCNTIP (procalcitonin:4,lacticidven:4) )No results found for this or any previous visit (from the past 240 hour(s)).   Radiological Exams on Admission: Ct Head Wo Contrast  Result Date: 03/02/2016 CLINICAL DATA:  Patient found under bad at home by a friend. Nonverbal. EXAM: CT HEAD WITHOUT CONTRAST TECHNIQUE: Contiguous axial images were obtained from the base of the skull through the vertex without intravenous contrast. COMPARISON:  02/08/2016 CT. FINDINGS: Brain: No evidence of acute infarction, hemorrhage, extra-axial collection, ventriculomegaly, or mass  effect. Moderate atrophy, advanced for age. Hypoattenuation of white matter is suspected, likely chronic microvascular ischemic change. Vascular: Hyperdense RIGHT vertebral felt to represent atheromatous change rather than thrombosis. Moderate vascular calcification in the carotid siphon regions. Skull: Negative for fracture or focal lesion. Sinuses/Orbits: No acute findings. Other: Calcific pannus surrounds the odontoid. 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 Retracted LEFT TM. 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 If further investigation desired, consider elective ENT consultation. Compared with priors, similar appearance. IMPRESSION: No acute intracranial findings.  Atrophy and small vessel disease. No change from priors. Electronically Signed   By: Elsie Stain M.D.   On: 03/02/2016 16:59  Dg Chest Portable 1 View  Result Date: 03/02/2016 CLINICAL DATA:  Rhonchi.  Abnormal breath sounds. EXAM: PORTABLE CHEST 1 VIEW COMPARISON:  02/10/2016 FINDINGS: Heart is normal size. Continued increased markings at the lung bases, likely scarring, stable. No confluent opacities or effusions. No acute bony abnormality. IMPRESSION: Bibasilar scarring, stable.  No active disease. Electronically Signed   By: Charlett Nose M.D.   On: 03/02/2016 15:33   EKG: Independently reviewed. Sinus tachycardia, anteroseptal Q-waves, ST-depressions diffusely   Assessment/Plan  1. Acute encephalopathy  - Uncertain etiology, most likely toxic-metabolic in setting of rhabdo and possible sepsis with severe AKI - No focal neurologic deficits to suggest CNS lesion  - Tox screen positive for benzos only (prescribed)  - Check thyroid studies, thiamine, B12, RPR, ammonia - Anticipate improvement with treatment of AKI and possible sepsis   2. Elevated troponin  - Troponin elevated to 0.14 on admission and  EKG with diffuse repol abnormality  - Likely represents demand ischemia in setting of possible sepsis and rhabdo with ARF  - Full-dose ASA given PR; metoprolol IVP given for associated HTN; statin held d/t rhabdo   - Monitor on telemetry for ischemic change; repeat EKG in am, sooner for angina  - Obtain serial troponin measurements  - Obtain TTE for wall-motion, ordered    3. Sepsis, unknown source   - Meets criteria with leukocytosis, tachycardia, and AKI   - Source is not clear and it is certainly possible that tachy and AKI are secondary to dehydration and rhabdo rather than sepsis  - Given 30 cc/kg NS in ED  - Blood and urine cultures requested  - Start empiric coverage with vancomycin and Zosyn given unknown source  - Trend lactate and PCT, follow-up cultures, tailor abx as appropriate    4. Rhabdomyolysis with AKI  - Pt down on ground at home for unknown amount of time  - Serum CK elevated to 2200  - SCr elevated to 4.10 on admission, up from 1.2 in 2015   - Given 2 L NS in ED  - Continue IVF, avoid nephrotoxins  - Check renal US and urine studies  - Repeat chem panel in am    5. Hypernatremia  - Serum sodium 154 on admission in setting of dehydration  - Given 2 L NS in ED  - Will continue IVF with 1/2-NS at 100 cc/hr - Check a BMP overnight and adjust fluids as appropriate    6. Hypertension  - At goal currently  - Managed with Lasix at home; holding for now given marked dehydration with AKI  - Started Lopressor on admission for HTN in setting of possible ACS   7. COPD with chronic hypoxic respiratory failure  - At baseline, requires 2 Lpm supplemental O2  - Received DuoNeb in ED, now with lungs CTAB  - Continue home inhalers, DuoNebs prn    8. Hx of alcohol abuse  - EtOH level <5 on admission  - Monitor with CIWA and prn Ativan  - Check thiamine level given AMS  - Supplement vitamins     DVT prophylaxis: sq heparin  Code Status: Full  Family Communication:  Discussed with patient  Disposition Plan: Admit to stepdown  Consults called: None  Admission status: Inpatient     Briscoe Deutscher, MD [facility] Pager [phone]  If 7PM-7AM, please contact night-coverage  [email or web address] Password [email or web address]  03/02/2016, 7:19 PM

## 2016-03-02 NOTE — ED Notes (Signed)
Called respiratory for neb treatment

## 2016-03-02 NOTE — ED Notes (Signed)
Reported that pt is on home O2

## 2016-03-02 NOTE — ED Provider Notes (Signed)
[facility]-EM[facility]ENCY DEPT Provider Note   CSN: [id number] Arrival date & time: 03/02/16  1439  First Provider Contact:  None       History   Chief Complaint Chief Complaint  Patient presents with  . Altered Mental Status    HPI [name] is a 75 y.o. male.  The history is provided by the EMS personnel and a friend. The history is limited by the condition of the patient (AMS).  Altered Mental Status    Pt was seen at 1450. Per EMS and friend: Pt's friend states he "hasn't heard from him since June," so he went to check on pt today. Friend found pt laying underneath his hospital bed, incont of urine, with his O2 tank "tipped over." Friend further states pt was able to look at him and "act like he was trying to talk to me." EMS states pt's VSS en route, CBG 130, lungs with rhonchi, non-verbal, moved extremities to command.      Past Medical History:  Diagnosis Date  . Alcoholism (HCC)   . Anemia   . Anxiety   . Bilateral lower extremity edema 09/03/2012   Possibly secondary to calcium channel blocker.  . Chronic back pain   . Chronic respiratory failure with hypoxia (HCC)   . COPD (chronic obstructive pulmonary disease) (HCC)   . Depression   . DJD (degenerative joint disease)   . Esophageal dysmotility 07/04/2011  . Essential hypertension, benign   . Myocardial infarction (HCC)   . Paroxysmal atrial fibrillation (HCC) 08/29/2012  . Partial small bowel obstruction (HCC) 09/02/2012  . Peripheral neuropathy (HCC)   . Pneumonia   . PTSD (post-traumatic stress disorder)   . Type II or unspecified type diabetes mellitus without mention of complication, not stated as uncontrolled     Patient Active Problem List   Diagnosis Date Noted  . COPD (chronic obstructive pulmonary disease) (HCC) 10/08/2013  . COPD exacerbation (HCC) 10/08/2013  . Guaiac positive stools 08/18/2013  . Chest pain 01/31/2013  . PTSD (post-traumatic stress disorder) 01/31/2013  . Leukocytosis,  unspecified 01/31/2013  . Chest pain at rest 01/24/2013  . Paroxysmal atrial fibrillation (HCC) 09/04/2012  . Bilateral lower extremity edema 09/03/2012  . Thrombocytopenia (HCC) 09/03/2012  . Nausea & vomiting 09/02/2012  . Partial small bowel obstruction (HCC) 09/02/2012  . Cor pulmonale (HCC) 08/29/2012  . Acute-on-chronic respiratory failure (HCC) 08/29/2012  . Physical deconditioning 08/18/2012  . Chronic respiratory failure with hypoxia (HCC) 08/17/2012  . Esophageal dysmotility 07/04/2011  . Anemia 07/02/2011  . Hyponatremia 07/02/2011  . Depression 07/01/2011  . Anxiety 07/01/2011  . Smoker 07/01/2011  . COPD GOLD II with reversibility 06/30/2011  . DM type 2 (diabetes mellitus, type 2) (HCC) 06/30/2011  . HTN (hypertension) 06/30/2011  . DDD (degenerative disc disease) 06/30/2011  . BPH (benign prostatic hyperplasia) 06/30/2011  . Dysphagia 06/30/2011    Past Surgical History:  Procedure Laterality Date  . APPENDECTOMY    . HERNIA REPAIR     X 3  . PROSTATE SURGERY    . Spinal surgeries     X 4     Home Medications    Prior to Admission medications   Medication Sig Start Date End Date Taking? Authorizing Provider  acetaminophen (TYLENOL) 500 MG tablet Take 1 tablet (500 mg total) by mouth every 4 (four) hours as needed for mild pain. 10/10/13   Standley Brooking, MD  albuterol (PROVENTIL HFA;VENTOLIN HFA) 108 (90 BASE) MCG/ACT inhaler Inhale 2 puffs into  the lungs every 6 (six) hours as needed for wheezing.    Historical Provider, MD  albuterol (PROVENTIL) (2.5 MG/3ML) 0.083% nebulizer solution Take 2.5 mg by nebulization every 6 (six) hours as needed for wheezing or shortness of breath.    Historical Provider, MD  ALPRAZolam Prudy Feeler) 0.25 MG tablet Take 1 tablet by mouth 3 (three) times daily. 05/06/13   Historical Provider, MD  budesonide-formoterol (SYMBICORT) 160-4.5 MCG/ACT inhaler Inhale 2 puffs into the lungs daily.    Historical Provider, MD  furosemide  (LASIX) 20 MG tablet Take 20 mg by mouth daily. For swelling in your feet and legs. Take potassium supplement with this medicine. 09/04/12   Elliot Cousin, MD  metFORMIN (GLUCOPHAGE) 500 MG tablet Take 500 mg by mouth daily.    Historical Provider, MD  OXYGEN-HELIUM IN Inhale 2-3 L into the lungs continuous.    Historical Provider, MD  pantoprazole (PROTONIX) 20 MG tablet Take 20 mg by mouth daily.    Historical Provider, MD  PARoxetine (PAXIL) 20 MG tablet Take 1 tablet by mouth daily. 05/23/13   Historical Provider, MD  potassium chloride SA (K-DUR,KLOR-CON) 20 MEQ tablet Take 1 tablet by mouth daily. 05/31/13   Historical Provider, MD  simvastatin (ZOCOR) 20 MG tablet Take 20 mg by mouth at bedtime.    Historical Provider, MD  Tamsulosin HCl (FLOMAX) 0.4 MG CAPS Take 1 capsule (0.4 mg total) by mouth daily after supper. 08/24/12   Rhetta Mura, MD  tiotropium (SPIRIVA) 18 MCG inhalation capsule Place 18 mcg into inhaler and inhale daily.    Historical Provider, MD  traZODone (DESYREL) 50 MG tablet Take 0.5-1 tablets by mouth daily.  05/23/13   Historical Provider, MD  Trolamine Salicylate (ASPERCREME EX) Apply 1 application topically as directed.    Historical Provider, MD  Vitamin D, Ergocalciferol, (DRISDOL) 50000 UNITS CAPS capsule Take 50,000 Units by mouth every 7 (seven) days.    Historical Provider, MD    Family History Family History  Problem Relation Age of Onset  . Lung disease Mother   . Prostate cancer Father     Social History Social History  Substance Use Topics  . Smoking status: Current Every Day Smoker    Packs/day: 0.50    Years: 55.00    Types: Cigarettes  . Smokeless tobacco: Never Used     Comment: 1 pack a week  . Alcohol use Yes     Comment: 2-3 cases this week per pt.     Allergies   Aspirin   Review of Systems Review of Systems  Unable to perform ROS: Dementia     Physical Exam Updated Vital Signs BP 161/93   Pulse 117   Resp 24   SpO2  97%    Patient Vitals for the past 24 hrs:  BP Pulse Resp SpO2  03/02/16 1600 161/93 117 24 97 %  03/02/16 1530 144/99 116 24 95 %  03/02/16 1529 - - - 96 %  03/02/16 1515 - - 26 -  03/02/16 1500 139/92 110 22 98 %  03/02/16 1459 - 111 22 98 %     Physical Exam 1455: Physical examination:  Nursing notes reviewed; Vital signs and O2 SAT reviewed;  Constitutional: Poor hygiene. In no acute distress; Head:  Normocephalic, atraumatic; Eyes: EOMI, PERRL, No scleral icterus; ENMT: Mouth and pharynx normal, Mucous membranes dry and crusted; Neck: Supple, Full range of motion; Cardiovascular: Tachycardic rate and rhythm, No gallop; Respiratory: Breath sounds coarse & equal bilaterally, No wheezes.  Normal respiratory effort/excursion; Chest: Nontender, Movement normal; Abdomen: Soft, Nontender, Nondistended, Normal bowel sounds; Genitourinary: No CVA tenderness; Extremities: Pulses normal, No tenderness, No edema, No calf edema or asymmetry.; Neuro: Awake, eyes open spontaneously and when staff calls his name, non-verbal. No facial droop. Moves extremities spontaneously..; Skin: Color normal, Warm, Dry.    ED Treatments / Results  Labs (all labs ordered are listed, but only abnormal results are displayed)   EKG  EKG Interpretation  Date/Time:  Sunday March 02 2016 14:54:24 EDT Ventricular Rate:  114 PR Interval:    QRS Duration: 73 QT Interval:  302 QTC Calculation: 416 R Axis:   79 Text Interpretation:  Sinus tachycardia Anteroseptal infarct, old Repol abnrm suggests ischemia, diffuse leads Baseline wander Artifact When compared with ECG of 02/24/2014 Nonspecific ST and T wave abnormality is now Present Confirmed by [facility]  MD, Nicholos Johns [phone]) on 03/02/2016 3:03:20 PM       Radiology   Procedures Procedures (including critical care time)  Medications Ordered in ED Medications  sodium chloride 0.9 % bolus 500 mL (not administered)  0.9 %  sodium chloride infusion (not  administered)  ipratropium-albuterol (DUONEB) 0.5-2.5 (3) MG/3ML nebulizer solution 3 mL (not administered)     Initial Impression / Assessment and Plan / ED Course  I have reviewed the triage vital signs and the nursing notes.  Pertinent labs & imaging results that were available during my care of the patient were reviewed by me and considered in my medical decision making (see chart for details).  MDM Reviewed: previous chart, nursing note and vitals Reviewed previous: labs, ECG and x-ray Interpretation: labs, ECG, x-ray and CT scan Total time providing critical care: 30-74 minutes. This excludes time spent performing separately reportable procedures and services. Consults: admitting MD    CRITICAL CARE Performed by: Laray Anger Total critical care time: 35 minutes Critical care time was exclusive of separately billable procedures and treating other patients. Critical care was necessary to treat or prevent imminent or life-threatening deterioration. Critical care was time spent personally by me on the following activities: development of treatment plan with patient and/or surrogate as well as nursing, discussions with consultants, evaluation of patient's response to treatment, examination of patient, obtaining history from patient or surrogate, ordering and performing treatments and interventions, ordering and review of laboratory studies, ordering and review of radiographic studies, pulse oximetry and re-evaluation of patient's condition.   Results for orders placed or performed during the hospital encounter of 03/02/16  Comprehensive metabolic panel  Result Value Ref Range   Sodium 154 (H) 135 - 145 mmol/L   Potassium 4.2 3.5 - 5.1 mmol/L   Chloride 120 (H) 101 - 111 mmol/L   CO2 18 (L) 22 - 32 mmol/L   Glucose, Bld 136 (H) 65 - 99 mg/dL   BUN 604 (H) 6 - 20 mg/dL   Creatinine, Ser 5.40 (H) 0.61 - 1.24 mg/dL   Calcium 9.3 8.9 - 98.1 mg/dL   Total Protein 8.3 (H) 6.5 -  8.1 g/dL   Albumin 4.4 3.5 - 5.0 g/dL   AST 55 (H) 15 - 41 U/L   ALT 33 17 - 63 U/L   Alkaline Phosphatase 96 38 - 126 U/L   Total Bilirubin 1.1 0.3 - 1.2 mg/dL   GFR calc non Af Amer 13 (L) >60 mL/min   GFR calc Af Amer 15 (L) >60 mL/min   Anion gap 16 (H) 5 - 15  Ethanol  Result Value Ref Range  Alcohol, Ethyl (B) <5 <5 mg/dL  Troponin I  Result Value Ref Range   Troponin I 0.14 (HH) <0.03 ng/mL  Lactic acid, plasma  Result Value Ref Range   Lactic Acid, Venous 1.5 0.5 - 1.9 mmol/L  CBC with Differential  Result Value Ref Range   WBC 22.6 (H) 4.0 - 10.5 K/uL   RBC 4.81 4.22 - 5.81 MIL/uL   Hemoglobin 14.1 13.0 - 17.0 g/dL   HCT 16.1 09.6 - 04.5 %   MCV 89.2 78.0 - 100.0 fL   MCH 29.3 26.0 - 34.0 pg   MCHC 32.9 30.0 - 36.0 g/dL   RDW 40.9 (H) 81.1 - 91.4 %   Platelets 306 150 - 400 K/uL   Neutrophils Relative % 82 %   Neutro Abs 18.4 (H) 1.7 - 7.7 K/uL   Lymphocytes Relative 11 %   Lymphs Abs 2.5 0.7 - 4.0 K/uL   Monocytes Relative 7 %   Monocytes Absolute 1.7 (H) 0.1 - 1.0 K/uL   Eosinophils Relative 0 %   Eosinophils Absolute 0.1 0.0 - 0.7 K/uL   Basophils Relative 0 %   Basophils Absolute 0.0 0.0 - 0.1 K/uL  Protime-INR  Result Value Ref Range   Prothrombin Time 14.6 11.4 - 15.2 seconds   INR 1.13   Acetaminophen level  Result Value Ref Range   Acetaminophen (Tylenol), Serum <10 (L) 10 - 30 ug/mL  Salicylate level  Result Value Ref Range   Salicylate Lvl <4.0 2.8 - 30.0 mg/dL  Urinalysis, Routine w reflex microscopic  Result Value Ref Range   Color, Urine YELLOW YELLOW   APPearance CLEAR CLEAR   Specific Gravity, Urine >1.030 (H) 1.005 - 1.030   pH 5.5 5.0 - 8.0   Glucose, UA NEGATIVE NEGATIVE mg/dL   Hgb urine dipstick MODERATE (A) NEGATIVE   Bilirubin Urine MODERATE (A) NEGATIVE   Ketones, ur TRACE (A) NEGATIVE mg/dL   Protein, ur 30 (A) NEGATIVE mg/dL   Nitrite NEGATIVE NEGATIVE   Leukocytes, UA NEGATIVE NEGATIVE  Urine rapid drug screen (hosp  performed)  Result Value Ref Range   Opiates NONE DETECTED NONE DETECTED   Cocaine NONE DETECTED NONE DETECTED   Benzodiazepines POSITIVE (A) NONE DETECTED   Amphetamines NONE DETECTED NONE DETECTED   Tetrahydrocannabinol NONE DETECTED NONE DETECTED   Barbiturates NONE DETECTED NONE DETECTED  CK  Result Value Ref Range   Total CK 2,189 (H) 49 - 397 U/L  Urine microscopic-add on  Result Value Ref Range   Squamous Epithelial / LPF NONE SEEN NONE SEEN   WBC, UA 0-5 0 - 5 WBC/hpf   RBC / HPF 0-5 0 - 5 RBC/hpf   Bacteria, UA FEW (A) NONE SEEN   Casts HYALINE CASTS (A) NEGATIVE    Ct Head Wo Contrast Result Date: 03/02/2016 CLINICAL DATA:  Patient found under bad at home by a friend. Nonverbal. EXAM: CT HEAD WITHOUT CONTRAST TECHNIQUE: Contiguous axial images were obtained from the base of the skull through the vertex without intravenous contrast. COMPARISON:  02/08/2016 CT. FINDINGS: Brain: No evidence of acute infarction, hemorrhage, extra-axial collection, ventriculomegaly, or mass effect. Moderate atrophy, advanced for age. Hypoattenuation of white matter is suspected, likely chronic microvascular ischemic change. Vascular: Hyperdense RIGHT vertebral felt to represent atheromatous change rather than thrombosis. Moderate vascular calcification in the carotid siphon regions. Skull: Negative for fracture or focal lesion. Sinuses/Orbits: No acute findings. Other: Calcific pannus surrounds the odontoid. 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 Retracted LEFT TM. 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 If further investigation desired, consider elective ENT consultation. Compared with priors, similar appearance. IMPRESSION: No acute intracranial findings.  Atrophy and small vessel disease. No change from priors. Electronically Signed   By: Elsie Stain M.D.   On: 03/02/2016  16:59  Dg Chest Portable 1 View Result Date: 03/02/2016 CLINICAL DATA:  Rhonchi.  Abnormal breath sounds. EXAM: PORTABLE CHEST 1 VIEW COMPARISON:  02/10/2016 FINDINGS: Heart is normal size. Continued increased markings at the lung bases, likely scarring, stable. No confluent opacities or effusions. No acute bony abnormality. IMPRESSION: Bibasilar scarring, stable.  No active disease. Electronically Signed   By: Charlett Nose M.D.   On: 03/02/2016 15:33    1700:  Short neb given for coarse lungs on arrival. IVF given for ARF, rhabdo. clinical dehydration. Pt continues to open his eyes when ED staff calls his name, moves all extremities. Remains aphasic. T/C to [facility]. Antionette Char, case discussed, including:  HPI, pertinent PM/SHx, VS/PE, dx testing, ED course and treatment:  Agreeable to admit, requests to write temporary orders, obtain stepdown bed to team APAdmits.     Final Clinical Impressions(s) / ED Diagnoses   Final diagnoses:  None    New Prescriptions New Prescriptions   No medications on file      Samuel Jester, DO 03/05/16 1307

## 2016-03-02 NOTE — ED Triage Notes (Signed)
Pt found under bed at home by a friend, nonverbal which is not normal.  No facial droop, cbg 130, diffuse rhonchi.  Last seen normal in June ?

## 2016-03-03 ENCOUNTER — Inpatient Hospital Stay (HOSPITAL_COMMUNITY): Payer: Medicare Other

## 2016-03-03 DIAGNOSIS — R7989 Other specified abnormal findings of blood chemistry: Secondary | ICD-10-CM

## 2016-03-03 DIAGNOSIS — J431 Panlobular emphysema: Secondary | ICD-10-CM

## 2016-03-03 LAB — CBC WITH DIFFERENTIAL/PLATELET
Basophils Absolute: 0 10*3/uL (ref 0.0–0.1)
Basophils Relative: 0 %
EOS ABS: 0.3 10*3/uL (ref 0.0–0.7)
EOS PCT: 2 %
HCT: 36.9 % — ABNORMAL LOW (ref 39.0–52.0)
Hemoglobin: 11.8 g/dL — ABNORMAL LOW (ref 13.0–17.0)
LYMPHS ABS: 2.1 10*3/uL (ref 0.7–4.0)
LYMPHS PCT: 12 %
MCH: 28.8 pg (ref 26.0–34.0)
MCHC: 32 g/dL (ref 30.0–36.0)
MCV: 90 fL (ref 78.0–100.0)
MONO ABS: 1.3 10*3/uL — AB (ref 0.1–1.0)
MONOS PCT: 8 %
Neutro Abs: 13.2 10*3/uL — ABNORMAL HIGH (ref 1.7–7.7)
Neutrophils Relative %: 78 %
PLATELETS: 257 10*3/uL (ref 150–400)
RBC: 4.1 MIL/uL — AB (ref 4.22–5.81)
RDW: 16 % — ABNORMAL HIGH (ref 11.5–15.5)
WBC: 17 10*3/uL — ABNORMAL HIGH (ref 4.0–10.5)

## 2016-03-03 LAB — TROPONIN I
Troponin I: 0.14 ng/mL (ref <0.03)
Troponin I: 0.12 ng/mL (ref <0.03)

## 2016-03-03 LAB — GLUCOSE, CAPILLARY
GLUCOSE-CAPILLARY: 105 mg/dL — AB (ref 65–99)
GLUCOSE-CAPILLARY: 127 mg/dL — AB (ref 65–99)
GLUCOSE-CAPILLARY: 71 mg/dL (ref 65–99)
Glucose-Capillary: 101 mg/dL — ABNORMAL HIGH (ref 65–99)
Glucose-Capillary: 91 mg/dL (ref 65–99)
Glucose-Capillary: 87 mg/dL (ref 65–99)
Glucose-Capillary: 70 mg/dL (ref 65–99)

## 2016-03-03 LAB — BASIC METABOLIC PANEL
Anion gap: 10 (ref 5–15)
Anion gap: 8 (ref 5–15)
BUN: 118 mg/dL — AB (ref 6–20)
BUN: 112 mg/dL — AB (ref 6–20)
CHLORIDE: 122 mmol/L — AB (ref 101–111)
CO2: 22 mmol/L (ref 22–32)
CO2: 23 mmol/L (ref 22–32)
CREATININE: 3.88 mg/dL — AB (ref 0.61–1.24)
CREATININE: 3.7 mg/dL — AB (ref 0.61–1.24)
Calcium: 8.6 mg/dL — ABNORMAL LOW (ref 8.9–10.3)
Calcium: 8.7 mg/dL — ABNORMAL LOW (ref 8.9–10.3)
Chloride: 122 mmol/L — ABNORMAL HIGH (ref 101–111)
GFR calc Af Amer: 16 mL/min — ABNORMAL LOW (ref >60)
GFR calc Af Amer: 17 mL/min — ABNORMAL LOW (ref >60)
GFR calc non Af Amer: 15 mL/min — ABNORMAL LOW (ref >60)
GFR, EST NON AFRICAN AMERICAN: 14 mL/min — AB (ref >60)
GLUCOSE: 121 mg/dL — AB (ref 65–99)
Glucose, Bld: 94 mg/dL (ref 65–99)
POTASSIUM: 4 mmol/L (ref 3.5–5.1)
Potassium: 4.6 mmol/L (ref 3.5–5.1)
SODIUM: 154 mmol/L — AB (ref 135–145)
SODIUM: 153 mmol/L — AB (ref 135–145)

## 2016-03-03 LAB — ECHOCARDIOGRAM COMPLETE
Height: 68 in
WEIGHTICAEL: 2218.71 [oz_av]

## 2016-03-03 LAB — VITAMIN B12: VITAMIN B 12: 1261 pg/mL — AB (ref 180–914)

## 2016-03-03 LAB — T4, FREE: FREE T4: 1.1 ng/dL (ref 0.61–1.12)

## 2016-03-03 MED ORDER — SODIUM CHLORIDE 0.45 % IV SOLN
INTRAVENOUS | Status: DC
  Administered 2016-03-03: 13:00:00 via INTRAVENOUS
  Administered 2016-03-03: 1000 mL via INTRAVENOUS
  Administered 2016-03-03: 11:00:00 via INTRAVENOUS
  Administered 2016-03-04: 125 mL/h via INTRAVENOUS

## 2016-03-03 MED ORDER — PANTOPRAZOLE SODIUM 40 MG PO TBEC
40.0000 mg | DELAYED_RELEASE_TABLET | Freq: Every day | ORAL | Status: DC
  Administered 2016-03-03 – 2016-03-07 (×4): 40 mg via ORAL
  Filled 2016-03-03 (×5): qty 1

## 2016-03-03 NOTE — Progress Notes (Signed)
*  PRELIMINARY RESULTS* Echocardiogram 2D Echocardiogram has been performed.  [name] 03/03/2016, 12:32 PM

## 2016-03-03 NOTE — Care Management Important Message (Signed)
Important Message  Patient Details  Name: [name] MRN: [id number] Date of Birth: 19-Jan-1941   Medicare Important Message Given:  Yes    Malcolm Metro, RN 03/03/2016, 3:04 PM

## 2016-03-03 NOTE — Clinical Social Work Note (Signed)
CSW received consult with no stated reason. Pt unable to participate in assessment at this time. Per MD note, will order PT evaluation when appropriate. CSW will continue to follow.   Derenda Fennel, LCSW [phone]

## 2016-03-03 NOTE — Progress Notes (Signed)
Call from son Windy Fast. " I  know my dad is there and I want an update. " Per pt he can only be told he is here and he wants him to have no information. PASSWORD given by pt is DOG

## 2016-03-03 NOTE — Progress Notes (Signed)
PROGRESS NOTE    [name]  NWG:[id number] DOB: 10/16/1940 DOA: 03/02/2016 PCP: Josue Hector, MD     Brief Narrative:  75 year old man admitted on 7/30 from home after being found unresponsive by a friend for an unknown period of time. He has a history significant for alcoholism, COPD with chronic hypoxic respiratory failure on oxygen and hypertension. Was found to have acute renal failure, acute encephalopathy, rhabdomyolysis and has been admitted for further evaluation and management.   Assessment & Plan:   Principal Problem:   Altered mental status Active Problems:   DM type 2 (diabetes mellitus, type 2) (HCC)   HTN (hypertension)   Depression   Chronic respiratory failure with hypoxia (HCC)   Paroxysmal atrial fibrillation (HCC)   Leukocytosis   COPD (chronic obstructive pulmonary disease) (HCC)   Pressure ulcer   AKI (acute kidney injury) (HCC)   Hypernatremia   Elevated troponin   Dehydration   Rhabdomyolysis   Acute encephalopathy   Sepsis (HCC)   Traumatic rhabdomyolysis (HCC)   Acute metabolic encephalopathy encephalopathy -Likely due to a combination of electrolyte abnormalities, specifically hypernatremia in the setting of rhabdomyolysis, severe dehydration and acute renal failure. -Appears more awake today, although not able to follow full conversation. -Monitor for improvement.  Hypernatremia -Likely due to being found down for an unknown period of time without any free water intake. -We'll start patient on half-normal saline and recheck sodium later today.  Questionable sepsis -No source of infection at present. -I do not believe he has sepsis, tachycardia and leukocytosis could simply be due to rhabdomyolysis and electrolyte abnormalities with acute renal failure. -We'll discontinue antibiotics and strike this diagnosis from problem list.  Elevated troponin -High of 0.14, EKG does not appear to have acute ischemic changes. -Likely  represents demand ischemia in the setting of severe dehydration, rhabdomyolysis and acute renal failure. -Agree with 2-D echo to rule out wall motion abnormalities and to check ejection fraction. If normal, do not anticipate further ischemic workup.  Rhabdomyolysis -Start IV fluids, recheck CPK level in a.m.  Acute renal failure -Last noted creatinine is 1.2 in 2015. -Greater than on admission was 4.1 down to 3.8 today. -We'll be started on IV fluids, recheck renal function in a.m.   history of alcohol abuse -Continue thiamine/folate. -Placed on CIWA protocol to monitor for withdrawals while in the hospital.  COPD, not exacerbated with chronic hypoxic respiratory failure -Continue home inhalers, continue supplemental oxygen.   DVT prophylaxis: Subcutaneous heparin Code Status: Full code Family Communication: Patient only Disposition Plan: Keep in ICU, hope for PT in am once more alert, suspect may need SNF  Consultants:   None  Procedures:   None  Antimicrobials:   None    Subjective: Still lethargic. Per RN woke up at some point this am saying "I need a Bud", referring to a beer.  Objective: Vitals:   03/03/16 0700 03/03/16 0800 03/03/16 0918 03/03/16 0924  BP: (!) 146/68     Pulse: 84     Resp: 19     Temp:  97.9 F (36.6 C)    TempSrc:  Oral    SpO2: 98%  100% 100%  Weight:      Height:        Intake/Output Summary (Last 24 hours) at 03/03/16 1023 Last data filed at 03/03/16 0900  Gross per 24 hour  Intake           908.33 ml  Output  825 ml  Net            83.33 ml   Filed Weights   03/02/16 1805 03/03/16 0500  Weight: 62.9 kg (138 lb 10.7 oz) 62.9 kg (138 lb 10.7 oz)    Examination:  General exam: lethargic, opens eyes briefly to voice and falls back asleep. Respiratory system: Clear to auscultation. Respiratory effort normal. Cardiovascular system:RRR. No murmurs, rubs, gallops. Gastrointestinal system: Abdomen is nondistended,  soft and nontender. No organomegaly or masses felt. Normal bowel sounds heard. Central nervous system: Unable to assess given current mental state. Extremities: No C/C/E, +pedal pulses Skin: No rashes, lesions or ulcers    Data Reviewed: I have personally reviewed following labs and imaging studies  CBC:  Recent Labs Lab 03/02/16 1549 03/03/16 0219  WBC 22.6* 17.0*  NEUTROABS 18.4* 13.2*  HGB 14.1 11.8*  HCT 42.9 36.9*  MCV 89.2 90.0  PLT 306 257   Basic Metabolic Panel:  Recent Labs Lab 03/02/16 1549 03/02/16 2122 03/03/16 0219  NA 154* 154* 154*  K 4.2 4.3 4.0  CL 120* 122* 122*  CO2 18* 20* 22  GLUCOSE 136* 149* 121*  BUN 112* 111* 118*  CREATININE 4.10* 3.85* 3.88*  CALCIUM 9.3 8.7* 8.6*   GFR: Estimated Creatinine Clearance: 15.1 mL/min (by C-G formula based on SCr of 3.88 mg/dL). Liver Function Tests:  Recent Labs Lab 03/02/16 1549  AST 55*  ALT 33  ALKPHOS 96  BILITOT 1.1  PROT 8.3*  ALBUMIN 4.4   No results for input(s): LIPASE, AMYLASE in the last 168 hours.  Recent Labs Lab 03/02/16 2122  AMMONIA 18   Coagulation Profile:  Recent Labs Lab 03/02/16 1549  INR 1.13   Cardiac Enzymes:  Recent Labs Lab 03/02/16 1549 03/02/16 1850 03/03/16 0219 03/03/16 0727  CKTOTAL 2,189*  --   --   --   TROPONINI 0.14* 0.13* 0.14* 0.12*   BNP (last 3 results) No results for input(s): PROBNP in the last 8760 hours. HbA1C: No results for input(s): HGBA1C in the last 72 hours. CBG:  Recent Labs Lab 03/02/16 2001 03/02/16 2354 03/03/16 0355 03/03/16 0713  GLUCAP 100* 127* 105* 101*   Lipid Profile: No results for input(s): CHOL, HDL, LDLCALC, TRIG, CHOLHDL, LDLDIRECT in the last 72 hours. Thyroid Function Tests:  Recent Labs  03/02/16 1850  TSH 1.528   Anemia Panel: No results for input(s): VITAMINB12, FOLATE, FERRITIN, TIBC, IRON, RETICCTPCT in the last 72 hours. Urine analysis:    Component Value Date/Time   COLORURINE  YELLOW 03/02/2016 1520   APPEARANCEUR CLEAR 03/02/2016 1520   LABSPEC >1.030 (H) 03/02/2016 1520   PHURINE 5.5 03/02/2016 1520   GLUCOSEU NEGATIVE 03/02/2016 1520   HGBUR MODERATE (A) 03/02/2016 1520   BILIRUBINUR MODERATE (A) 03/02/2016 1520   KETONESUR TRACE (A) 03/02/2016 1520   PROTEINUR 30 (A) 03/02/2016 1520   UROBILINOGEN 0.2 02/22/2014 1750   NITRITE NEGATIVE 03/02/2016 1520   LEUKOCYTESUR NEGATIVE 03/02/2016 1520   Sepsis Labs: @LABRCNTIP (procalcitonin:4,lacticidven:4)  ) Recent Results (from the past 240 hour(s))  Culture, blood (x 2)     Status: None (Preliminary result)   Collection Time: 03/02/16  6:50 PM  Result Value Ref Range Status   Specimen Description BLOOD LEFT ARM  Final   Special Requests BOTTLES DRAWN AEROBIC AND ANAEROBIC 6CC EACH  Final   Culture NO GROWTH < 24 HOURS  Final   Report Status PENDING  Incomplete  MRSA PCR Screening     Status: None  Collection Time: 03/02/16  9:00 PM  Result Value Ref Range Status   MRSA by PCR NEGATIVE NEGATIVE Final    Comment:        The GeneXpert MRSA Assay (FDA approved for NASAL specimens only), is one component of a comprehensive MRSA colonization surveillance program. It is not intended to diagnose MRSA infection nor to guide or monitor treatment for MRSA infections.   Culture, blood (x 2)     Status: None (Preliminary result)   Collection Time: 03/02/16  9:22 PM  Result Value Ref Range Status   Specimen Description LEFT ANTECUBITAL  Final   Special Requests BOTTLES DRAWN AEROBIC AND ANAEROBIC 8CC  Final   Culture NO GROWTH < 12 HOURS  Final   Report Status PENDING  Incomplete         Radiology Studies: Ct Head Wo Contrast  Result Date: 03/02/2016 CLINICAL DATA:  Patient found under bad at home by a friend. Nonverbal. EXAM: CT HEAD WITHOUT CONTRAST TECHNIQUE: Contiguous axial images were obtained from the base of the skull through the vertex without intravenous contrast. COMPARISON:  02/08/2016  CT. FINDINGS: Brain: No evidence of acute infarction, hemorrhage, extra-axial collection, ventriculomegaly, or mass effect. Moderate atrophy, advanced for age. Hypoattenuation of white matter is suspected, likely chronic microvascular ischemic change. Vascular: Hyperdense RIGHT vertebral felt to represent atheromatous change rather than thrombosis. Moderate vascular calcification in the carotid siphon regions. Skull: Negative for fracture or focal lesion. Sinuses/Orbits: No acute findings. Other: Calcific pannus surrounds the odontoid. 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 Retracted LEFT TM. 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 If further investigation desired, consider elective ENT consultation. Compared with priors, similar appearance. IMPRESSION: No acute intracranial findings.  Atrophy and small vessel disease. No change from priors. Electronically Signed   By: Elsie Stain M.D.   On: 03/02/2016 16:59  Dg Chest Port 1 View  Result Date: 03/02/2016 CLINICAL DATA:  Sepsis. EXAM: PORTABLE CHEST 1 VIEW COMPARISON:  Portable chest radiograph earlier this day at 1521 hour FINDINGS: Lower lung volumes from prior. Heart size and mediastinal contours are unchanged. Mild elevation of right hemidiaphragm likely related to lower lung volumes. Upper lobe emphysema. Bibasilar opacities likely scarring. No confluent airspace disease to suggest pneumonia. No evidence of pleural effusion. No pneumothorax. IMPRESSION: Lower lung volumes from prior exam. Upper lobe emphysema. Stable bibasilar scarring. Electronically Signed   By: Rubye Oaks M.D.   On: 03/02/2016 19:45  Dg Chest Portable 1 View  Result Date: 03/02/2016 CLINICAL DATA:  Rhonchi.  Abnormal breath sounds. EXAM: PORTABLE CHEST 1 VIEW COMPARISON:  02/10/2016 FINDINGS: Heart is normal size. Continued increased markings at the  lung bases, likely scarring, stable. No confluent opacities or effusions. No acute bony abnormality. IMPRESSION: Bibasilar scarring, stable.  No active disease. Electronically Signed   By: Charlett Nose M.D.   On: 03/02/2016 15:33       Scheduled Meds: . ALPRAZolam  0.25 mg Oral TID  . aspirin  324 mg Oral NOW   Or  . aspirin  300 mg Rectal NOW  . aspirin EC  81 mg Oral Daily  . folic acid  1 mg Intravenous Daily  . heparin  5,000 Units Subcutaneous Q8H  . insulin aspart  0-9 Units Subcutaneous Q4H  . metoprolol tartrate  12.5 mg Oral BID  . mometasone-formoterol  2 puff Inhalation BID  . pantoprazole  40 mg Oral Daily  .  PARoxetine  20 mg Oral Daily  . piperacillin-tazobactam (ZOSYN)  IV  2.25 g Intravenous Q6H  . sodium chloride  1,000 mL Intravenous Once  . sodium chloride  500 mL Intravenous Once  . tamsulosin  0.4 mg Oral QPC supper  . thiamine  100 mg Intravenous Daily  . tiotropium  18 mcg Inhalation Daily  . [START ON 03/04/2016] vancomycin  1,000 mg Intravenous Q48H  . Vitamin D (Ergocalciferol)  50,000 Units Oral Q7 days   Continuous Infusions: . sodium chloride       LOS: 1 day    Time spent: 35 minutes. Greater than 50% of this time was spent in direct contact with the patient coordinating care.     Chaya Jan, MD [facility] Pager [phone]  If 7PM-7AM, please contact night-coverage [email or web address] Password [facility] 03/03/2016, 10:23 AM

## 2016-03-04 LAB — BASIC METABOLIC PANEL
Anion gap: 5 (ref 5–15)
BUN: 100 mg/dL — AB (ref 6–20)
CHLORIDE: 123 mmol/L — AB (ref 101–111)
CO2: 25 mmol/L (ref 22–32)
CREATININE: 3.16 mg/dL — AB (ref 0.61–1.24)
Calcium: 8.5 mg/dL — ABNORMAL LOW (ref 8.9–10.3)
GFR calc Af Amer: 21 mL/min — ABNORMAL LOW (ref >60)
GFR calc non Af Amer: 18 mL/min — ABNORMAL LOW (ref >60)
GLUCOSE: 73 mg/dL (ref 65–99)
POTASSIUM: 3.9 mmol/L (ref 3.5–5.1)
SODIUM: 153 mmol/L — AB (ref 135–145)

## 2016-03-04 LAB — CBC
HEMATOCRIT: 33.1 % — AB (ref 39.0–52.0)
Hemoglobin: 10.7 g/dL — ABNORMAL LOW (ref 13.0–17.0)
MCH: 29.3 pg (ref 26.0–34.0)
MCHC: 32.3 g/dL (ref 30.0–36.0)
MCV: 90.7 fL (ref 78.0–100.0)
PLATELETS: 171 10*3/uL (ref 150–400)
RBC: 3.65 MIL/uL — ABNORMAL LOW (ref 4.22–5.81)
RDW: 16 % — AB (ref 11.5–15.5)
WBC: 11 10*3/uL — AB (ref 4.0–10.5)

## 2016-03-04 LAB — GLUCOSE, CAPILLARY
GLUCOSE-CAPILLARY: 153 mg/dL — AB (ref 65–99)
GLUCOSE-CAPILLARY: 90 mg/dL (ref 65–99)
GLUCOSE-CAPILLARY: 68 mg/dL (ref 65–99)
GLUCOSE-CAPILLARY: 128 mg/dL — AB (ref 65–99)
GLUCOSE-CAPILLARY: 72 mg/dL (ref 65–99)
Glucose-Capillary: 68 mg/dL (ref 65–99)
Glucose-Capillary: 72 mg/dL (ref 65–99)

## 2016-03-04 LAB — URINE CULTURE: CULTURE: NO GROWTH

## 2016-03-04 LAB — UREA NITROGEN, URINE: UREA NITROGEN UR: 833 mg/dL

## 2016-03-04 LAB — FOLATE RBC
Folate, Hemolysate: 377 ng/mL
Folate, RBC: 1016 ng/mL (ref >498)
HEMATOCRIT: 37.1 % — AB (ref 37.5–51.0)

## 2016-03-04 LAB — RPR: RPR Ser Ql: NONREACTIVE

## 2016-03-04 LAB — HIV ANTIBODY (ROUTINE TESTING W REFLEX): HIV Screen 4th Generation wRfx: NONREACTIVE

## 2016-03-04 MED ORDER — DEXTROSE 50 % IV SOLN
INTRAVENOUS | Status: AC
  Administered 2016-03-04: 25 mL
  Filled 2016-03-04: qty 50

## 2016-03-04 MED ORDER — DEXTROSE 5 % IV SOLN
INTRAVENOUS | Status: DC
  Administered 2016-03-04 – 2016-03-07 (×7): via INTRAVENOUS

## 2016-03-04 MED ORDER — CETYLPYRIDINIUM CHLORIDE 0.05 % MT LIQD
7.0000 mL | Freq: Two times a day (BID) | OROMUCOSAL | Status: DC
  Administered 2016-03-04 – 2016-03-07 (×6): 7 mL via OROMUCOSAL

## 2016-03-04 NOTE — Clinical Social Work Note (Signed)
CSW received call from Irine Seal at [facility] [phone]). Judeth Cornfield reports she has been assigned case and will see pt either today or tomorrow.   Derenda Fennel, LCSW [phone]

## 2016-03-04 NOTE — Progress Notes (Signed)
PROGRESS NOTE    [name]  GNF:[id number] DOB: [birth year] DOA: 03/02/2016 PCP: Josue Hector, MD     Brief Narrative:  75 year old man admitted on 7/30 from home after being found unresponsive by a friend for an unknown period of time. He has a history significant for alcoholism, COPD with chronic hypoxic respiratory failure on oxygen and hypertension. Was found to have acute renal failure, acute encephalopathy, rhabdomyolysis and has been admitted for further evaluation and management.   Assessment & Plan:   Principal Problem:   Altered mental status Active Problems:   DM type 2 (diabetes mellitus, type 2) (HCC)   HTN (hypertension)   Depression   Chronic respiratory failure with hypoxia (HCC)   Paroxysmal atrial fibrillation (HCC)   Leukocytosis   COPD (chronic obstructive pulmonary disease) (HCC)   Pressure ulcer   AKI (acute kidney injury) (HCC)   Hypernatremia   Elevated troponin   Dehydration   Rhabdomyolysis   Acute encephalopathy   Traumatic rhabdomyolysis (HCC)   Acute metabolic encephalopathy -Likely due to a combination of electrolyte abnormalities, specifically hypernatremia in the setting of rhabdomyolysis, severe dehydration and acute renal failure. -Appears more awake today. -Monitor for improvement.  Hypernatremia -Likely due to being found down for an unknown period of time without any free water intake. -Na without change at 153. DC 0.45% NS and change to D5W. -Recheck Na in am.  Elevated troponin -High of 0.14, EKG does not appear to have acute ischemic changes. -Likely represents demand ischemia in the setting of severe dehydration, rhabdomyolysis and acute renal failure. -ECHO: EF 60-65%, grade 1 diastolic dysfunction, no WMA. -No further cardiac work up anticipated.  Rhabdomyolysis -Start IV fluids, recheck CPK level in a.m.  Acute renal failure -Last noted creatinine is 1.2 in 2015. -Greater than on admission was 4.1 down to  316 today. -Continue IVF, recheck renal function in am.  history of alcohol abuse -Continue thiamine/folate. -Placed on CIWA protocol to monitor for withdrawals while in the hospital.  COPD, not exacerbated with chronic hypoxic respiratory failure -Continue home inhalers, continue supplemental oxygen.  Dysphagia -Keep NPO pending ST evaluation.   DVT prophylaxis: Subcutaneous heparin Code Status: Full code Family Communication: Patient only Disposition Plan: Transfer to floor, obtain PT eval. Suspect will need SNF.  Consultants:   None  Procedures:   None  Antimicrobials:   None    Subjective: More awake, states he has some mild abdominal pain. Would like to eat.  Objective: Vitals:   03/04/16 0800 03/04/16 0803 03/04/16 0809 03/04/16 0813  BP: (!) 149/58     Pulse: 70     Resp: (!) 22     Temp:  97.9 F (36.6 C)    TempSrc:  Axillary    SpO2: 99%  99% 99%  Weight:      Height:        Intake/Output Summary (Last 24 hours) at 03/04/16 1010 Last data filed at 03/04/16 0800  Gross per 24 hour  Intake             2750 ml  Output              600 ml  Net             2150 ml   Filed Weights   03/02/16 1805 03/03/16 0500 03/04/16 0500  Weight: 62.9 kg (138 lb 10.7 oz) 62.9 kg (138 lb 10.7 oz) 64.2 kg (141 lb 8.6 oz)    Examination:  General exam: awake,  oriented Respiratory system: Clear to auscultation. Respiratory effort normal. Cardiovascular system:RRR. No murmurs, rubs, gallops. Gastrointestinal system: Abdomen is nondistended, soft and nontender. No organomegaly or masses felt. Normal bowel sounds heard. Central nervous system: Unable to assess given current mental state. Extremities: No C/C/E, +pedal pulses Skin: No rashes, lesions or ulcers    Data Reviewed: I have personally reviewed following labs and imaging studies  CBC:  Recent Labs Lab 03/02/16 1549 03/03/16 0219 03/04/16 0414  WBC 22.6* 17.0* 11.0*  NEUTROABS 18.4* 13.2*  --     HGB 14.1 11.8* 10.7*  HCT 42.9 36.9* 33.1*  MCV 89.2 90.0 90.7  PLT 306 257 171   Basic Metabolic Panel:  Recent Labs Lab 03/02/16 1549 03/02/16 2122 03/03/16 0219 03/03/16 1547 03/04/16 0414  NA 154* 154* 154* 153* 153*  K 4.2 4.3 4.0 4.6 3.9  CL 120* 122* 122* 122* 123*  CO2 18* 20* 22 23 25   GLUCOSE 136* 149* 121* 94 73  BUN 112* 111* 118* 112* 100*  CREATININE 4.10* 3.85* 3.88* 3.70* 3.16*  CALCIUM 9.3 8.7* 8.6* 8.7* 8.5*   GFR: Estimated Creatinine Clearance: 18.9 mL/min (by C-G formula based on SCr of 3.16 mg/dL). Liver Function Tests:  Recent Labs Lab 03/02/16 1549  AST 55*  ALT 33  ALKPHOS 96  BILITOT 1.1  PROT 8.3*  ALBUMIN 4.4   No results for input(s): LIPASE, AMYLASE in the last 168 hours.  Recent Labs Lab 03/02/16 2122  AMMONIA 18   Coagulation Profile:  Recent Labs Lab 03/02/16 1549  INR 1.13   Cardiac Enzymes:  Recent Labs Lab 03/02/16 1549 03/02/16 1850 03/03/16 0219 03/03/16 0727  CKTOTAL 2,189*  --   --   --   TROPONINI 0.14* 0.13* 0.14* 0.12*   BNP (last 3 results) No results for input(s): PROBNP in the last 8760 hours. HbA1C: No results for input(s): HGBA1C in the last 72 hours. CBG:  Recent Labs Lab 03/03/16 1952 03/03/16 2344 03/04/16 0411 03/04/16 0446 03/04/16 0802  GLUCAP 70 71 68 153* 90   Lipid Profile: No results for input(s): CHOL, HDL, LDLCALC, TRIG, CHOLHDL, LDLDIRECT in the last 72 hours. Thyroid Function Tests:  Recent Labs  03/02/16 1850  TSH 1.528  FREET4 1.10   Anemia Panel:  Recent Labs  03/02/16 2122  VITAMINB12 1,261*   Urine analysis:    Component Value Date/Time   COLORURINE YELLOW 03/02/2016 1520   APPEARANCEUR CLEAR 03/02/2016 1520   LABSPEC >1.030 (H) 03/02/2016 1520   PHURINE 5.5 03/02/2016 1520   GLUCOSEU NEGATIVE 03/02/2016 1520   HGBUR MODERATE (A) 03/02/2016 1520   BILIRUBINUR MODERATE (A) 03/02/2016 1520   KETONESUR TRACE (A) 03/02/2016 1520   PROTEINUR 30 (A)  03/02/2016 1520   UROBILINOGEN 0.2 02/22/2014 1750   NITRITE NEGATIVE 03/02/2016 1520   LEUKOCYTESUR NEGATIVE 03/02/2016 1520   Sepsis Labs: @LABRCNTIP (procalcitonin:4,lacticidven:4)  ) Recent Results (from the past 240 hour(s))  Urine culture     Status: None   Collection Time: 03/02/16  3:20 PM  Result Value Ref Range Status   Specimen Description URINE, CATHETERIZED  Final   Special Requests NONE  Final   Culture NO GROWTH Performed at [facility]   Final   Report Status 03/04/2016 FINAL  Final  Culture, blood (x 2)     Status: None (Preliminary result)   Collection Time: 03/02/16  6:50 PM  Result Value Ref Range Status   Specimen Description BLOOD LEFT ARM  Final   Special Requests BOTTLES DRAWN  AEROBIC AND ANAEROBIC 6CC EACH  Final   Culture NO GROWTH 2 DAYS  Final   Report Status PENDING  Incomplete  MRSA PCR Screening     Status: None   Collection Time: 03/02/16  9:00 PM  Result Value Ref Range Status   MRSA by PCR NEGATIVE NEGATIVE Final    Comment:        The GeneXpert MRSA Assay (FDA approved for NASAL specimens only), is one component of a comprehensive MRSA colonization surveillance program. It is not intended to diagnose MRSA infection nor to guide or monitor treatment for MRSA infections.   Culture, blood (x 2)     Status: None (Preliminary result)   Collection Time: 03/02/16  9:22 PM  Result Value Ref Range Status   Specimen Description LEFT ANTECUBITAL  Final   Special Requests BOTTLES DRAWN AEROBIC AND ANAEROBIC 8CC  Final   Culture NO GROWTH 2 DAYS  Final   Report Status PENDING  Incomplete         Radiology Studies: Ct Head Wo Contrast  Result Date: 03/02/2016 CLINICAL DATA:  Patient found under bad at home by a friend. Nonverbal. EXAM: CT HEAD WITHOUT CONTRAST TECHNIQUE: Contiguous axial images were obtained from the base of the skull through the vertex without intravenous contrast. COMPARISON:  02/08/2016 CT. FINDINGS: Brain: No  evidence of acute infarction, hemorrhage, extra-axial collection, ventriculomegaly, or mass effect. Moderate atrophy, advanced for age. Hypoattenuation of white matter is suspected, likely chronic microvascular ischemic change. Vascular: Hyperdense RIGHT vertebral felt to represent atheromatous change rather than thrombosis. Moderate vascular calcification in the carotid siphon regions. Skull: Negative for fracture or focal lesion. Sinuses/Orbits: No acute findings. Other: Calcific pannus surrounds the odontoid. 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 Retracted LEFT TM. 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 If further investigation desired, consider elective ENT consultation. Compared with priors, similar appearance. IMPRESSION: No acute intracranial findings.  Atrophy and small vessel disease. No change from priors. Electronically Signed   By: Elsie Stain M.D.   On: 03/02/2016 16:59  US Renal  Result Date: 03/03/2016 CLINICAL DATA:  Acute renal failure and sepsis. EXAM: RENAL / URINARY TRACT ULTRASOUND COMPLETE COMPARISON:  CT 01/06/2016 FINDINGS: Right Kidney: Length: 8.5 cm. Echogenicity within normal limits. No mass or hydronephrosis visualized. Left Kidney: Length: 9.2 cm. Echogenicity within normal limits. No mass or hydronephrosis visualized. Bladder: Not imaged. IMPRESSION: No acute urinary pathology.  No evidence of hydronephrosis. Electronically Signed   By: Jolaine Click M.D.   On: 03/03/2016 11:40  Dg Chest Port 1 View  Result Date: 03/02/2016 CLINICAL DATA:  Sepsis. EXAM: PORTABLE CHEST 1 VIEW COMPARISON:  Portable chest radiograph earlier this day at 1521 hour FINDINGS: Lower lung volumes from prior. Heart size and mediastinal contours are unchanged. Mild elevation of right hemidiaphragm likely related to lower lung volumes. Upper lobe emphysema. Bibasilar opacities  likely scarring. No confluent airspace disease to suggest pneumonia. No evidence of pleural effusion. No pneumothorax. IMPRESSION: Lower lung volumes from prior exam. Upper lobe emphysema. Stable bibasilar scarring. Electronically Signed   By: Rubye Oaks M.D.   On: 03/02/2016 19:45  Dg Chest Portable 1 View  Result Date: 03/02/2016 CLINICAL DATA:  Rhonchi.  Abnormal breath sounds. EXAM: PORTABLE CHEST 1 VIEW COMPARISON:  02/10/2016 FINDINGS: Heart is normal size. Continued increased markings at the lung bases, likely scarring, stable. No confluent opacities or effusions. No acute bony abnormality.  IMPRESSION: Bibasilar scarring, stable.  No active disease. Electronically Signed   By: Charlett Nose M.D.   On: 03/02/2016 15:33       Scheduled Meds: . ALPRAZolam  0.25 mg Oral TID  . aspirin EC  81 mg Oral Daily  . folic acid  1 mg Intravenous Daily  . heparin  5,000 Units Subcutaneous Q8H  . insulin aspart  0-9 Units Subcutaneous Q4H  . metoprolol tartrate  12.5 mg Oral BID  . mometasone-formoterol  2 puff Inhalation BID  . pantoprazole  40 mg Oral Daily  . PARoxetine  20 mg Oral Daily  . sodium chloride  1,000 mL Intravenous Once  . sodium chloride  500 mL Intravenous Once  . tamsulosin  0.4 mg Oral QPC supper  . thiamine  100 mg Intravenous Daily  . tiotropium  18 mcg Inhalation Daily  . Vitamin D (Ergocalciferol)  50,000 Units Oral Q7 days   Continuous Infusions: . dextrose       LOS: 2 days    Time spent: 25 minutes. Greater than 50% of this time was spent in direct contact with the patient coordinating care.     Chaya Jan, MD [facility] Pager [phone]  If 7PM-7AM, please contact night-coverage [email or web address] Password [email or web address] 03/04/2016, 10:10 AM

## 2016-03-04 NOTE — Evaluation (Signed)
Clinical/Bedside Swallow Evaluation Patient Details  Name: [name] MRN: [id number] Date of Birth: Oct 02, 1940  Today's Date: 03/04/2016 Time: SLP Start Time (ACUTE ONLY): 1123 SLP Stop Time (ACUTE ONLY): 1210 SLP Time Calculation (min) (ACUTE ONLY): 47 min  Past Medical History:  Past Medical History:  Diagnosis Date  . Alcoholism (HCC)   . Anemia   . Anxiety   . Bilateral lower extremity edema 09/03/2012   Possibly secondary to calcium channel blocker.  . Chronic back pain   . Chronic respiratory failure with hypoxia (HCC)   . COPD (chronic obstructive pulmonary disease) (HCC)   . Depression   . DJD (degenerative joint disease)   . Esophageal dysmotility 07/04/2011  . Essential hypertension, benign   . Myocardial infarction (HCC)   . Paroxysmal atrial fibrillation (HCC) 08/29/2012  . Partial small bowel obstruction (HCC) 09/02/2012  . Peripheral neuropathy (HCC)   . Pneumonia   . PTSD (post-traumatic stress disorder)   . Type II or unspecified type diabetes mellitus without mention of complication, not stated as uncontrolled    Past Surgical History:  Past Surgical History:  Procedure Laterality Date  . APPENDECTOMY    . HERNIA REPAIR     X 3  . PROSTATE SURGERY    . Spinal surgeries     X 4   HPI:  73 yom with a hx of alcoholism, depressidepression, anxiety, COPD with chronic hypoxic respiratory failure, diabetes, and HTN presents with complaints of being down for one day. While in the ED patient was found to be hypernatremic with elevated creatine and  troponin. Pt was admitted for firther evaluation and management of acute encephalopathy with rhabdomyolysis and acute renal failure, as well as possible sepsis with unknown source and elevated troponin concerning for possible ACS.   Assessment / Plan / Recommendation Clinical Impression  [name] was seen at bedside for a clinical evaluation of swallow. Oral care completed and pt noted to have very dry oral cavity  with dried secretions. Pt tolerated a few ice chips, however demonstrated delayed and immediate coughing after sips thin, nectar, and puree. Oral motor examination remarkable for gross weakness and suspected delay in swallow initiation. Pt appears at high risk for aspiration at this time, however recommend ice chips after oral care with SLP to reassess tomorrow. Pt attempts to communicate with SLP and oriented to "[name]". He tells me that he was under his bed "for a few days", but does not recall how he got there. He tells me that he has two sons and a sister [name] from [place]")    Aspiration Risk  Moderate aspiration risk    Diet Recommendation NPO;Ice chips PRN after oral care   Medication Administration: Via alternative means    Other  Recommendations Oral Care Recommendations: Oral care QID;Oral care prior to ice chip/H20 Other Recommendations: Have oral suction available   Follow up Recommendations  Skilled Nursing facility    Frequency and Duration min 2x/week  1 week       Prognosis Prognosis for Safe Diet Advancement: Fair Barriers to Reach Goals: Cognitive deficits      Swallow Study   General Date of Onset: 03/02/16 HPI: 55 yom with a hx of alcoholism, depressidepression, anxiety, COPD with chronic hypoxic respiratory failure, diabetes, and HTN presents with complaints of being down for one day. While in the ED patient was found to be hypernatremic with elevated creatine and  troponin. Pt was admitted for firther evaluation and management of acute  encephalopathy with rhabdomyolysis and acute renal failure, as well as possible sepsis with unknown source and elevated troponin concerning for possible ACS. Type of Study: Bedside Swallow Evaluation Previous Swallow Assessment: 2012-BSE Diet Prior to this Study: NPO Temperature Spikes Noted: No Respiratory Status: Nasal cannula History of Recent Intubation: No Behavior/Cognition:  Alert;Lethargic/Drowsy;Requires cueing;Cooperative Oral Cavity Assessment: Dry;Dried secretions Oral Care Completed by SLP: Yes Oral Cavity - Dentition: Edentulous Vision: Functional for self-feeding Self-Feeding Abilities: Needs assist Patient Positioning: Upright in bed Baseline Vocal Quality: Low vocal intensity (mild hoarseness) Volitional Cough: Strong;Congested Volitional Swallow: Able to elicit    Oral/Motor/Sensory Function Overall Oral Motor/Sensory Function: Mild impairment Facial ROM: Within Functional Limits Facial Symmetry: Within Functional Limits Facial Strength: Within Functional Limits Facial Sensation: Within Functional Limits Lingual ROM: Within Functional Limits Lingual Symmetry: Within Functional Limits Lingual Strength: Reduced Lingual Sensation: Within Functional Limits Velum: Within Functional Limits Mandible: Within Functional Limits   Ice Chips Ice chips: Within functional limits Presentation: Spoon   Thin Liquid Thin Liquid: Impaired Presentation: Cup;Spoon Pharyngeal  Phase Impairments: Suspected delayed Swallow;Cough - Delayed;Cough - Immediate    Nectar Thick Nectar Thick Liquid: Impaired Presentation: Cup Pharyngeal Phase Impairments: Suspected delayed Swallow;Cough - Immediate;Cough - Delayed   Honey Thick Honey Thick Liquid: Not tested   Puree Puree: Impaired Presentation: Spoon Pharyngeal Phase Impairments: Cough - Delayed   Solid   GO   Solid: Not tested        [name] 03/04/2016,1:23 PM

## 2016-03-05 DIAGNOSIS — G9340 Encephalopathy, unspecified: Secondary | ICD-10-CM

## 2016-03-05 DIAGNOSIS — N179 Acute kidney failure, unspecified: Principal | ICD-10-CM

## 2016-03-05 LAB — GLUCOSE, CAPILLARY
GLUCOSE-CAPILLARY: 166 mg/dL — AB (ref 65–99)
GLUCOSE-CAPILLARY: 130 mg/dL — AB (ref 65–99)
GLUCOSE-CAPILLARY: 117 mg/dL — AB (ref 65–99)
Glucose-Capillary: 108 mg/dL — ABNORMAL HIGH (ref 65–99)
Glucose-Capillary: 151 mg/dL — ABNORMAL HIGH (ref 65–99)

## 2016-03-05 LAB — BASIC METABOLIC PANEL
Anion gap: 9 (ref 5–15)
BUN: 72 mg/dL — ABNORMAL HIGH (ref 6–20)
CHLORIDE: 121 mmol/L — AB (ref 101–111)
CO2: 22 mmol/L (ref 22–32)
CREATININE: 2.29 mg/dL — AB (ref 0.61–1.24)
Calcium: 8.9 mg/dL (ref 8.9–10.3)
GFR calc non Af Amer: 27 mL/min — ABNORMAL LOW (ref >60)
GFR, EST AFRICAN AMERICAN: 31 mL/min — AB (ref >60)
Glucose, Bld: 113 mg/dL — ABNORMAL HIGH (ref 65–99)
Potassium: 3.7 mmol/L (ref 3.5–5.1)
Sodium: 152 mmol/L — ABNORMAL HIGH (ref 135–145)

## 2016-03-05 LAB — CK: Total CK: 791 U/L — ABNORMAL HIGH (ref 49–397)

## 2016-03-05 NOTE — Progress Notes (Signed)
[facility] PROGRESS NOTE  [name] YTW:[id number] DOB: May 15, 1941    PCP:   Josue Hector, MD   HPI:  38 yom with a hx of alcoholism, depression, anxiety, COPD with chronic hypoxic respiratory failure, diabetes, and HTN presents with complaints of being down for one day. While in the ED patient was found to be hypernatremic with elevated creatine and  troponin.  He was found to have rhabdo and AKI.  Since admission, he has been more alert, but is still quite lethargic overall.  His Cr has improved from about 4 to now 2.3.  CPK has improved from 791 from 2K range.  His  BC, HIV, and NH3 levels were negative.   Rewiew of Systems:  Unable.   Past Medical History:  Diagnosis Date  . Alcoholism (HCC)   . Anemia   . Anxiety   . Bilateral lower extremity edema 09/03/2012   Possibly secondary to calcium channel blocker.  . Chronic back pain   . Chronic respiratory failure with hypoxia (HCC)   . COPD (chronic obstructive pulmonary disease) (HCC)   . Depression   . DJD (degenerative joint disease)   . Esophageal dysmotility 07/04/2011  . Essential hypertension, benign   . Myocardial infarction (HCC)   . Paroxysmal atrial fibrillation (HCC) 08/29/2012  . Partial small bowel obstruction (HCC) 09/02/2012  . Peripheral neuropathy (HCC)   . Pneumonia   . PTSD (post-traumatic stress disorder)   . Type II or unspecified type diabetes mellitus without mention of complication, not stated as uncontrolled     Past Surgical History:  Procedure Laterality Date  . APPENDECTOMY    . HERNIA REPAIR     X 3  . PROSTATE SURGERY    . Spinal surgeries     X 4    Medications:  HOME MEDS: Prior to Admission medications   Medication Sig Start Date End Date Taking? Authorizing Provider  albuterol (PROVENTIL HFA;VENTOLIN HFA) 108 (90 BASE) MCG/ACT inhaler Inhale 2 puffs into the lungs every 6 (six) hours as needed for wheezing.   Yes Historical Provider, MD  ALPRAZolam Prudy Feeler) 0.25 MG  tablet Take 1 tablet by mouth 3 (three) times daily as needed for anxiety.  05/06/13  Yes Historical Provider, MD  atorvastatin (LIPITOR) 40 MG tablet Take 40 mg by mouth daily at 6 PM.   Yes Historical Provider, MD  budesonide-formoterol (SYMBICORT) 160-4.5 MCG/ACT inhaler Inhale 2 puffs into the lungs daily.   Yes Historical Provider, MD  [facility] VITAMIN B12 1000 MCG tablet Take 1,000 mcg by mouth daily. 01/18/16  Yes Historical Provider, MD  furosemide (LASIX) 20 MG tablet Take 20 mg by mouth daily. For swelling in your feet and legs. Take potassium supplement with this medicine. 09/04/12  Yes Elliot Cousin, MD  glimepiride (AMARYL) 4 MG tablet Take 1 mg by mouth daily. 01/18/16  Yes Historical Provider, MD  ipratropium-albuterol (DUONEB) 0.5-2.5 (3) MG/3ML SOLN Take 3 mLs by nebulization 4 (four) times daily.   Yes Historical Provider, MD  iron polysaccharides (NIFEREX) 150 MG capsule Take 150 mg by mouth daily.   Yes Historical Provider, MD  metFORMIN (GLUCOPHAGE) 1000 MG tablet Take 1 tablet by mouth 2 (two) times daily. 01/18/16  Yes Historical Provider, MD  metoprolol (LOPRESSOR) 50 MG tablet Take 50 mg by mouth 2 (two) times daily.   Yes Historical Provider, MD  oxazepam (SERAX) 10 MG capsule Take 10 mg by mouth at bedtime as needed for sleep.   Yes Historical Provider, MD  pantoprazole (PROTONIX) 40 MG tablet Take 40 mg by mouth daily.   Yes Historical Provider, MD  PARoxetine (PAXIL) 20 MG tablet Take 1 tablet by mouth daily. 05/23/13  Yes Historical Provider, MD  potassium chloride SA (K-DUR,KLOR-CON) 20 MEQ tablet Take 1 tablet by mouth daily. 05/31/13  Yes Historical Provider, MD  Tamsulosin HCl (FLOMAX) 0.4 MG CAPS Take 1 capsule (0.4 mg total) by mouth daily after supper. 08/24/12  Yes Rhetta Mura, MD  tiotropium (SPIRIVA) 18 MCG inhalation capsule Place 18 mcg into inhaler and inhale daily.   Yes Historical Provider, MD  traMADol (ULTRAM) 50 MG tablet Take 50 mg by mouth 3 (three) times  daily as needed for moderate pain.   Yes Historical Provider, MD  traZODone (DESYREL) 50 MG tablet Take 50 mg by mouth at bedtime.  05/23/13  Yes Historical Provider, MD  OXYGEN-HELIUM IN Inhale 2-3 L into the lungs continuous.    Historical Provider, MD     Allergies:  Allergies  Allergen Reactions  . Aspirin Other (See Comments)    Stomach upset    Social History:   reports that he has been smoking Cigarettes.  He has a 27.50 pack-year smoking history. He has never used smokeless tobacco. He reports that he drinks alcohol. He reports that he does not use drugs.  Family History: Family History  Problem Relation Age of Onset  . Lung disease Mother   . Prostate cancer Father      Physical Exam: Vitals:   03/04/16 2220 03/05/16 0652 03/05/16 1106 03/05/16 1410  BP: (!) 163/51 (!) 104/53 108/62 (!) 148/62  Pulse: 78 78 84 71  Resp: Temp: 97.9 F (36.6 C) 98.7 F (37.1 C)  98.2 F (36.8 C)  TempSrc: Oral Oral    SpO2: 98% 100%  100%  Weight:   64.2 kg (141 lb 8.6 oz)   Height:       Blood pressure (!) 148/62, pulse 71, temperature 98.2 F (36.8 C), resp. rate 18, height  (1.727 m), weight 64.2 kg (141 lb 8.6 oz), SpO2 100 %.  GEN:  Still quite lethargic.  HEENT: Mucous membranes pink and anicteric; PERRLA; EOM intact; no cervical lymphadenopathy nor thyromegaly or carotid bruit; no JVD; There were no stridor. Neck is very supple. Breasts:: Not examined CHEST WALL: No tenderness CHEST: Normal respiration, clear to auscultation bilaterally.  HEART: Regular rate and rhythm.  There are no murmur, rub, or gallops.   BACK: No kyphosis or scoliosis; no CVA tenderness ABDOMEN: soft and non-tender; no masses, no organomegaly, normal abdominal bowel sounds; no pannus; no intertriginous candida. There is no rebound and no distention. Rectal Exam: Not done EXTREMITIES: No bone or joint deformity; age-appropriate arthropathy of the hands and knees; no edema; no  ulcerations.  There is no calf tenderness. Genitalia: not examined PULSES: 2+ and symmetric SKIN: Normal hydration no rash or ulceration CNS: Cranial nerves 2-12 grossly intact no focal lateralizing neurologic deficit.  Speech is fluent; uvula elevated with phonation, facial symmetry and tongue midline. DTR are normal bilaterally, cerebella exam is intact, barbinski is negative and strengths are equaled bilaterally.  No sensory loss.   Labs on Admission:  Basic Metabolic Panel:  Recent Labs Lab 03/02/16 2122 03/03/16 0219 03/03/16 1547 03/04/16 0414 03/05/16 0451  NA 154* 154* 153* 153* 152*  K 4.3 4.0 4.6 3.9 3.7  CL 122* 122* 122* 123* 121*  CO2 20* GLUCOSE 149* 121* 94  73 113*  BUN 111* 118* 112* 100* 72*  CREATININE 3.85* 3.88* 3.70* 3.16* 2.29*  CALCIUM 8.7* 8.6* 8.7* 8.5* 8.9   Liver Function Tests:  Recent Labs Lab 03/02/16 1549  AST 55*  ALT 33  ALKPHOS 96  BILITOT 1.1  PROT 8.3*  ALBUMIN 4.4    CBC:  Recent Labs Lab 03/02/16 1549 03/02/16 2122 03/03/16 0219 03/04/16 0414  WBC 22.6*  --  17.0* 11.0*  NEUTROABS 18.4*  --  13.2*  --   HGB 14.1  --  11.8* 10.7*  HCT 42.9 37.1* 36.9* 33.1*  MCV 89.2  --  90.0 90.7  PLT 306  --  257 171   Cardiac Enzymes:  Recent Labs Lab 03/02/16 1549 03/02/16 1850 03/03/16 0219 03/03/16 0727 03/05/16 0451  CKTOTAL 2,189*  --   --   --  791*  TROPONINI 0.14* 0.13* 0.14* 0.12*  --     CBG:  Recent Labs Lab 03/04/16 2010 03/04/16 2351 03/05/16 0419 03/05/16 0849 03/05/16 1134  GLUCAP 72 72 108* 166* 130*   Assessment/Plan Present on Admission: . Altered mental status . Pressure ulcer . COPD (chronic obstructive pulmonary disease) (HCC) . Chronic respiratory failure with hypoxia (HCC) . Depression . HTN (hypertension) . Paroxysmal atrial fibrillation (HCC) . AKI (acute kidney injury) (HCC) . Leukocytosis . Hypernatremia . Elevated troponin . Dehydration . Rhabdomyolysis . Acute  encephalopathy  PLAN:  Acute metabolic encephalopathy -Likely due to a combination of electrolyte abnormalities, specifically hypernatremia in the setting of rhabdomyolysis, severe dehydration and acute renal failure. -still lethargic, but he has improved.    Hypernatremia -Likely due to being found down for an unknown period of time without any free water intake. -Na without change at 153. DC 0.45% NS and change to D5W.  Will continue with IVF.   Elevated troponin -High of 0.14, EKG does not appear to have acute ischemic changes. -Likely represents demand ischemia in the setting of severe dehydration, rhabdomyolysis and acute renal failure. -ECHO: EF 60-65%, grade 1 diastolic dysfunction, no WMA. -No further cardiac work up anticipated.  Rhabdomyolysis - Has improved.    Acute renal failure -Last noted creatinine is 1.2 in 2015. -Continue IVF, recheck renal function in am. -It has improved.  history of alcohol abuse -Continue thiamine/folate. -Placed on CIWA protocol to monitor for withdrawals while in the hospital.  COPD, not exacerbated with chronic hypoxic respiratory failure -Continue home inhalers, continue supplemental oxygen.  Dysphagia:  - Ice chip per ST. - At risk for aspiration at this time.     Other plans as per orders. Code Status: FULL Unk Lightning, MD.  FACP [facility] Pager [phone] 7pm to 7am.  03/05/2016, 4:48 PM

## 2016-03-05 NOTE — Clinical Social Work Note (Signed)
[name] with [facility] met with pt and pt did not respond to any questions. She plans to see pt again tomorrow and if no change, will pursue protective order.   [name], [place]

## 2016-03-05 NOTE — Progress Notes (Signed)
Speech Language Pathology Treatment: Dysphagia  Patient Details Name: [name] MRN: [id number] DOB: 1940-09-07 Today's Date: 03/05/2016 Time: 5809-9833 SLP Time Calculation (min) (ACUTE ONLY): 24 min  Assessment / Plan / Recommendation Clinical Impression  Pt seen at bedside for reassessment of swallow. Pt more alert today after sternal rub and repositioning. Oral care completed and pt agreeable to po trials. Pt continues to present with mild wheezing (without po), decreased vocal intensity, and breathy vocal quality. Pt with oral delays and needs encouragement to "swallow hard", suspect delay in swallow initiation with occasional loose cough after thin liquids. Pt showed improved tolerance of puree and HTL. Recommend initiating diet of D1/puree and HTL (honey-thick liquids) with strict aspiration precautions and only feed pt when alert and upright. PO meds whole or crushed as able in puree. SLP to follow.   HPI HPI: 69 yom with a hx of alcoholism, depressidepression, anxiety, COPD with chronic hypoxic respiratory failure, diabetes, and HTN presents with complaints of being down for one day. While in the ED patient was found to be hypernatremic with elevated creatine and  troponin. Pt was admitted for firther evaluation and management of acute encephalopathy with rhabdomyolysis and acute renal failure, as well as possible sepsis with unknown source and elevated troponin concerning for possible ACS.      SLP Plan  Continue with current plan of care     Recommendations  Diet recommendations: Dysphagia 1 (puree);Honey-thick liquid Liquids provided via: Teaspoon;Cup;No straw Medication Administration: Whole meds with puree Supervision: Staff to assist with self feeding;Full supervision/cueing for compensatory strategies Compensations: Minimize environmental distractions;Slow rate;Small sips/bites;Lingual sweep for clearance of pocketing;Clear throat intermittently Postural Changes and/or  Swallow Maneuvers: Seated upright 90 degrees;Upright 30-60 min after meal             Oral Care Recommendations: Oral care BID;Staff/trained caregiver to provide oral care Follow up Recommendations: Skilled Nursing facility Plan: Continue with current plan of care     Thank you,  Havery Moros, CCC-SLP [phone]                PORTER,DABNEY 03/05/2016, 3:53 PM

## 2016-03-06 DIAGNOSIS — J9611 Chronic respiratory failure with hypoxia: Secondary | ICD-10-CM

## 2016-03-06 LAB — BASIC METABOLIC PANEL
Anion gap: 4 — ABNORMAL LOW (ref 5–15)
BUN: 49 mg/dL — AB (ref 6–20)
CALCIUM: 8.5 mg/dL — AB (ref 8.9–10.3)
CO2: 27 mmol/L (ref 22–32)
CREATININE: 1.86 mg/dL — AB (ref 0.61–1.24)
Chloride: 114 mmol/L — ABNORMAL HIGH (ref 101–111)
GFR calc non Af Amer: 34 mL/min — ABNORMAL LOW (ref >60)
GFR, EST AFRICAN AMERICAN: 40 mL/min — AB (ref >60)
GLUCOSE: 104 mg/dL — AB (ref 65–99)
Potassium: 3.5 mmol/L (ref 3.5–5.1)
Sodium: 145 mmol/L (ref 135–145)

## 2016-03-06 LAB — GLUCOSE, CAPILLARY
GLUCOSE-CAPILLARY: 125 mg/dL — AB (ref 65–99)
Glucose-Capillary: 143 mg/dL — ABNORMAL HIGH (ref 65–99)
Glucose-Capillary: 155 mg/dL — ABNORMAL HIGH (ref 65–99)
Glucose-Capillary: 158 mg/dL — ABNORMAL HIGH (ref 65–99)

## 2016-03-06 LAB — VITAMIN B1: VITAMIN B1 (THIAMINE): 165.5 nmol/L (ref 66.5–200.0)

## 2016-03-06 NOTE — Progress Notes (Signed)
[facility] PROGRESS NOTE  KA FLAMMER ZOX:[id number] DOB: 1940/10/16    PCP:   Josue Hector, MD   HPI:  79 yom with a hx of alcoholism, depression, anxiety, COPD with chronic hypoxic respiratory failure, diabetes, and HTN presents with complaints of being down for one day. While in the ED patient was found to be hypernatremic with elevated creatine and troponin.  He was found to have rhabdo and AKI.  Since admission, he has been more alert, but is still quite lethargic overall.  His Cr has improved from about 4 to now 2.3.  CPK has improved from 791 from 2K range.  His  BC, HIV, and NH3 levels were negative. He has improved, and he was started on dysphagia I with HTL. He will need to be discharged to SNF.    Rewiew of Systems:  Constitutional: Negative for malaise, fever and chills. No significant weight loss or weight gain Eyes: Negative for eye pain, redness and discharge, diplopia, visual changes, or flashes of light. ENMT: Negative for ear pain, hoarseness, nasal congestion, sinus pressure and sore throat. No headaches; tinnitus, drooling, or problem swallowing. Cardiovascular: Negative for chest pain, palpitations, diaphoresis, dyspnea and peripheral edema. ; No orthopnea, PND Respiratory: Negative for cough, hemoptysis, wheezing and stridor. No pleuritic chestpain. Gastrointestinal: Negative for nausea, vomiting, diarrhea, constipation, abdominal pain, melena, blood in stool, hematemesis, jaundice and rectal bleeding.    Genitourinary: Negative for frequency, dysuria, incontinence,flank pain and hematuria; Musculoskeletal: Negative for back pain and neck pain. Negative for swelling and trauma.;  Skin: . Negative for pruritus, rash, abrasions, bruising and skin lesion.; ulcerations Neuro: Negative for headache, lightheadedness and neck stiffness. Negative for weakness, altered level of consciousness , altered mental status, extremity weakness, burning feet, involuntary  movement, seizure and syncope.  Psych: negative for anxiety, depression, insomnia, tearfulness, panic attacks, hallucinations, paranoia, suicidal or homicidal ideation   Past Medical History:  Diagnosis Date  . Alcoholism (HCC)   . Anemia   . Anxiety   . Bilateral lower extremity edema 09/03/2012   Possibly secondary to calcium channel blocker.  . Chronic back pain   . Chronic respiratory failure with hypoxia (HCC)   . COPD (chronic obstructive pulmonary disease) (HCC)   . Depression   . DJD (degenerative joint disease)   . Esophageal dysmotility 07/04/2011  . Essential hypertension, benign   . Myocardial infarction (HCC)   . Paroxysmal atrial fibrillation (HCC) 08/29/2012  . Partial small bowel obstruction (HCC) 09/02/2012  . Peripheral neuropathy (HCC)   . Pneumonia   . PTSD (post-traumatic stress disorder)   . Type II or unspecified type diabetes mellitus without mention of complication, not stated as uncontrolled     Past Surgical History:  Procedure Laterality Date  . APPENDECTOMY    . HERNIA REPAIR     X 3  . PROSTATE SURGERY    . Spinal surgeries     X 4    Medications:  HOME MEDS: Prior to Admission medications   Medication Sig Start Date End Date Taking? Authorizing Provider  albuterol (PROVENTIL HFA;VENTOLIN HFA) 108 (90 BASE) MCG/ACT inhaler Inhale 2 puffs into the lungs every 6 (six) hours as needed for wheezing.   Yes Historical Provider, MD  ALPRAZolam Prudy Feeler) 0.25 MG tablet Take 1 tablet by mouth 3 (three) times daily as needed for anxiety.  05/06/13  Yes Historical Provider, MD  atorvastatin (LIPITOR) 40 MG tablet Take 40 mg by mouth daily at 6 PM.   Yes Historical Provider, MD  budesonide-formoterol (SYMBICORT) 160-4.5 MCG/ACT inhaler Inhale 2 puffs into the lungs daily.   Yes Historical Provider, MD  [employer] VITAMIN B12 1000 MCG tablet Take 1,000 mcg by mouth daily. 01/18/16  Yes Historical Provider, MD  furosemide (LASIX) 20 MG tablet Take 20 mg by mouth daily.  For swelling in your feet and legs. Take potassium supplement with this medicine. 09/04/12  Yes Elliot Cousin, MD  glimepiride (AMARYL) 4 MG tablet Take 1 mg by mouth daily. 01/18/16  Yes Historical Provider, MD  ipratropium-albuterol (DUONEB) 0.5-2.5 (3) MG/3ML SOLN Take 3 mLs by nebulization 4 (four) times daily.   Yes Historical Provider, MD  iron polysaccharides (NIFEREX) 150 MG capsule Take 150 mg by mouth daily.   Yes Historical Provider, MD  metFORMIN (GLUCOPHAGE) 1000 MG tablet Take 1 tablet by mouth 2 (two) times daily. 01/18/16  Yes Historical Provider, MD  metoprolol (LOPRESSOR) 50 MG tablet Take 50 mg by mouth 2 (two) times daily.   Yes Historical Provider, MD  oxazepam (SERAX) 10 MG capsule Take 10 mg by mouth at bedtime as needed for sleep.   Yes Historical Provider, MD  pantoprazole (PROTONIX) 40 MG tablet Take 40 mg by mouth daily.   Yes Historical Provider, MD  PARoxetine (PAXIL) 20 MG tablet Take 1 tablet by mouth daily. 05/23/13  Yes Historical Provider, MD  potassium chloride SA (K-DUR,KLOR-CON) 20 MEQ tablet Take 1 tablet by mouth daily. 05/31/13  Yes Historical Provider, MD  Tamsulosin HCl (FLOMAX) 0.4 MG CAPS Take 1 capsule (0.4 mg total) by mouth daily after supper. 08/24/12  Yes Rhetta Mura, MD  tiotropium (SPIRIVA) 18 MCG inhalation capsule Place 18 mcg into inhaler and inhale daily.   Yes Historical Provider, MD  traMADol (ULTRAM) 50 MG tablet Take 50 mg by mouth 3 (three) times daily as needed for moderate pain.   Yes Historical Provider, MD  traZODone (DESYREL) 50 MG tablet Take 50 mg by mouth at bedtime.  05/23/13  Yes Historical Provider, MD  OXYGEN-HELIUM IN Inhale 2-3 L into the lungs continuous.    Historical Provider, MD     Allergies:  Allergies  Allergen Reactions  . Aspirin Other (See Comments)    Stomach upset    Social History:   reports that he has been smoking Cigarettes.  He has a 27.50 pack-year smoking history. He has never used smokeless  tobacco. He reports that he drinks alcohol. He reports that he does not use drugs.  Family History: Family History  Problem Relation Age of Onset  . Lung disease Mother   . Prostate cancer Father      Physical Exam: Vitals:   03/06/16 0626 03/06/16 0752 03/06/16 1115 03/06/16 1526  BP: (!) 152/78  (!) 142/54 139/61  Pulse: 68   72  Resp: 20   20  Temp: 97.9 F (36.6 C)   98.1 F (36.7 C)  TempSrc: Oral     SpO2: 98% 98%  98%  Weight:      Height:       Blood pressure 139/61, pulse 72, temperature 98.1 F (36.7 C), resp. rate 20, height 5\' 8"  (1.727 m), weight 64.2 kg (141 lb 8.6 oz), SpO2 98 %.  GEN:  Pleasant patient lying in the stretcher in no acute distress; cooperative with exam. PSYCH:  alert but is confused. does not appear anxious or depressed; affect is appropriate. HEENT: Mucous membranes pink and anicteric; PERRLA; EOM intact; no cervical lymphadenopathy nor thyromegaly or carotid bruit; no JVD; There were no stridor.  Neck is very supple. Breasts:: Not examined CHEST WALL: No tenderness CHEST: Normal respiration, clear to auscultation bilaterally.  HEART: Regular rate and rhythm.  There are no murmur, rub, or gallops.   BACK: No kyphosis or scoliosis; no CVA tenderness ABDOMEN: soft and non-tender; no masses, no organomegaly, normal abdominal bowel sounds; no pannus; no intertriginous candida. There is no rebound and no distention. Rectal Exam: Not done EXTREMITIES: No bone or joint deformity; age-appropriate arthropathy of the hands and knees; no edema; no ulcerations.  There is no calf tenderness. Genitalia: not examined PULSES: 2+ and symmetric SKIN: Normal hydration no rash or ulceration CNS: Cranial nerves 2-12 grossly intact no focal lateralizing neurologic deficit.  Speech is fluent; uvula elevated with phonation, facial symmetry and tongue midline. DTR are normal bilaterally, cerebella exam is intact, barbinski is negative and strengths are equaled  bilaterally.  No sensory loss.   Labs on Admission:  Basic Metabolic Panel:  Recent Labs Lab 03/03/16 0219 03/03/16 1547 03/04/16 0414 03/05/16 0451 03/06/16 0602  NA 154* 153* 153* 152* 145  K 4.0 4.6 3.9 3.7 3.5  CL 122* 122* 123* 121* 114*  CO2 22 23 25 22 27   GLUCOSE 121* 94 73 113* 104*  BUN 118* 112* 100* 72* 49*  CREATININE 3.88* 3.70* 3.16* 2.29* 1.86*  CALCIUM 8.6* 8.7* 8.5* 8.9 8.5*   Liver Function Tests:  Recent Labs Lab 03/02/16 1549  AST 55*  ALT 33  ALKPHOS 96  BILITOT 1.1  PROT 8.3*  ALBUMIN 4.4    Recent Labs Lab 03/02/16 2122  AMMONIA 18   CBC:  Recent Labs Lab 03/02/16 1549 03/02/16 2122 03/03/16 0219 03/04/16 0414  WBC 22.6*  --  17.0* 11.0*  NEUTROABS 18.4*  --  13.2*  --   HGB 14.1  --  11.8* 10.7*  HCT 42.9 37.1* 36.9* 33.1*  MCV 89.2  --  90.0 90.7  PLT 306  --  257 171   Cardiac Enzymes:  Recent Labs Lab 03/02/16 1549 03/02/16 1850 03/03/16 0219 03/03/16 0727 03/05/16 0451  CKTOTAL 2,189*  --   --   --  791*  TROPONINI 0.14* 0.13* 0.14* 0.12*  --     CBG:  Recent Labs Lab 03/05/16 2110 03/06/16 0010 03/06/16 0805 03/06/16 1117 03/06/16 1636  GLUCAP 151* 143* 125* 158* 155*   Assessment/Plan Present on Admission: . Altered mental status . Pressure ulcer . COPD (chronic obstructive pulmonary disease) (HCC) . Chronic respiratory failure with hypoxia (HCC) . Depression . HTN (hypertension) . Paroxysmal atrial fibrillation (HCC) . AKI (acute kidney injury) (HCC) . Leukocytosis . Hypernatremia . Elevated troponin . Dehydration . Rhabdomyolysis . Acute encephalopathy  PLAN:  Acute metabolic encephalopathy -Likely due to a combination of electrolyte abnormalities, specifically hypernatremia in the setting of rhabdomyolysis, severe dehydration and acute renal failure. -still lethargic, but he has improved.    Hypernatremia -Likely due to being found down for an unknown period of time without any  free water intake. -Na without change at 153. DC 0.45% NS and change to D5W.  Will continue with IVF.  -It has corrected nicely.   Elevated troponin -High of 0.14, EKG does not appear to have acute ischemic changes. -Likely represents demand ischemia in the setting of severe dehydration, rhabdomyolysis and acute renal failure. -ECHO: EF 60-65%, grade 1 diastolic dysfunction, no WMA. -No further cardiac work up anticipated.  Rhabdomyolysis - Has improved.    Acute renal failure -Last noted creatinine is 1.2 in 2015. -Continue IVF, recheck  renal function in am. -It has improved.  history of alcohol abuse -Continue thiamine/folate. -Placed on CIWA protocol to monitor for withdrawals while in the hospital.  COPD, not exacerbated with chronic hypoxic respiratory failure -Continue home inhalers, continue supplemental oxygen.  Dysphagia:  - start D1/HTL per rec of SLT.    Other plans as per orders. Code Status:FULL CODE>    Houston Siren, MD.  FACP [facility] Pager [phone] 7pm to 7am.  03/06/2016, 5:35 PM

## 2016-03-06 NOTE — Care Management Note (Signed)
Case Management Note  Patient Details  Name: [name] MRN: [id number] Date of Birth: 1940/08/10  Subjective/Objective:  Patient from home, adm with AMS. Patient was found unconscious, unknown how long he was down. Patient goes to [facility]. [place] notified, patient is not service connected, however [facility] states [facility] with [facility] term [facility] needs, but not long term needs.             Action/Plan: PT has recommended SNF, patient still with transient confusion. CSW in contact with DSS and family regarding possible SNF placement. Will follow.   Expected Discharge Date:                  Expected Discharge Plan:  Skilled Nursing Facility  In-House Referral:  Clinical Social Work  Discharge planning Services  CM Consult  Post Acute Care Choice:    Choice offered to:     DME Arranged:    DME Agency:     HH Arranged:    HH Agency:     Status of Service:  In process, will continue to follow  If discussed at Long Length of Stay Meetings, dates discussed:    Additional Comments:  Jerre Diguglielmo, Chrystine Oiler, RN 03/06/2016, 10:48 AM

## 2016-03-06 NOTE — Clinical Social Work Note (Signed)
Clinical Social Work Assessment  Patient Details  Name: [name] MRN: [id number] Date of Birth: Apr 11, 1941  Date of referral:  03/06/16               Reason for consult:  Discharge Planning                Permission sought to share information with:    Permission granted to share information::     Name::        Agency::     Relationship::     Contact Information:     Housing/Transportation Living arrangements for the past 2 months:  Single Family Home Source of Information:  Adult Children Patient Interpreter Needed:  None Criminal Activity/Legal Involvement Pertinent to Current Situation/Hospitalization:  No - Comment as needed Significant Relationships:  Adult Children Lives with:  Self Do you feel safe going back to the place where you live?  No Need for family participation in patient care:  Yes (Comment)  Care giving concerns:  Pt is unable to return home in current condition. DSS involved.    Social Worker assessment / plan:  Irine Seal with [facility] DSS attempted to speak with pt, but pt unable to contribute any appropriate information. CSW attempted to meet with pt, but he was sleeping soundly. Judeth Cornfield reports that pt's son, [name] is willing to assist with d/c planning and they will not be pursuing protective order as long as he is involved. CSW spoke with [name] who shared that pt has been living alone and has had a caregiver on and off. [name] and pt's other son, Windy Fast both work out of town. When in town, [name] said he sees pt a few times a week. However, he said pt can be very difficult to deal with when he is drinking. He shared that pt recently got out of [facility] and he believes he may have signed himself out. CSW discussed placement process and PT recommendations. Randall agrees with SNF and would like [place] if possible. Will initiate bed search.   Employment status:  Retired Database administrator PT  Recommendations:  Skilled Nursing Facility Information / Referral to community resources:  Skilled Nursing Facility  Patient/Family's Response to care:  Pt's son requests placement in [place].   Patient/Family's Understanding of and Emotional Response to Diagnosis, Current Treatment, and Prognosis:  Pt's son indicates that pt's drinking has been his "downfall." Pt's sons have avoided him at times due to excessive drinking. He agrees at this time that pt needs SNF and is willing to sign him in.   Emotional Assessment Appearance:  Appears stated age Attitude/Demeanor/Rapport:  Unable to Assess Affect (typically observed):  Unable to Assess Orientation:  Oriented to Self Alcohol / Substance use:  Alcohol Use Psych involvement (Current and /or in the community):  No (Comment)  Discharge Needs  Concerns to be addressed:  Decision making concerns, Discharge Planning Concerns Readmission within the last 30 days:  No Current discharge risk:  Lives alone, Physical Impairment Barriers to Discharge:  Continued Medical Work up   Karn Cassis, LCSW 03/06/2016, 11:19 AM [phone]

## 2016-03-06 NOTE — Evaluation (Signed)
Physical Therapy Evaluation Patient Details Name: [name] MRN: [id number] DOB: 03/21/1941 Today's Date: 03/06/2016   History of Present Illness  75 yo M admitted 03/02/2016 after being found down Dx: acute encephalopathy with rhabdomyolysis and acute renal failure, as well as possible sepsis with unknown source and elevated troponin concerning for possible ACS.  PMH: ETOH, depression, anxiety, COPD, chronic hypoxic respiratory failure, HTN  Clinical Impression  Pt received in bed, and was agreeable to PT evaluation.  He was not able to provide much insight on his PLOF at this time due to cognitive status.  He required Max A for supine<>sit, as well as Sit<>stand transfers.  He was not able to take any steps at this time.  He will need SNF for further rehabilitation for strength, balance, AROM, as well as endurance, and functional mobility training.      Follow Up Recommendations  SNF    Equipment Recommendations    None recommended by PT   Recommendations for Other Services  OT consult    Precautions / Restrictions Precautions Precautions: Fall Restrictions Weight Bearing Restrictions: No      Mobility  Bed Mobility  Needs assistance  Supine to Sit: Max A with HOB elevated  Sit to Supine: Total A            Transfers  Needs Assistance   Device used: RW (2 wheeled)  Sit to/from Stand: Max A x 2 trials.  Poor hip extension and ability to come into full upright position. Pt demonstrates strong posterior lean.               Ambulation/Gait  Attempted to take a few lateral steps along the EOB, but pt not able to at this time.               Stairs            Wheelchair Mobility    Modified Rankin (Stroke Patients Only)       Balance  Needs Assistance  Sitting balance support: B UE's supported  Sitting balance Leahy Scale: Poor  Comments: Min/Mod A due to posterior lean    Standing balance support: B UE's supported  Standing balance Leahy  Scale: Poor  Comments: Posterior lean                             Pertinent Vitals/Pain  Pt denies pain    Home Living  Living Arrangements: Alone  Type of Home: House  Home Layout: One level                  Prior Function   Unsure of pt's PLOF - pt having difficulty answering questions  Communication: Pt is difficult to understand at times due to some slurred speech.  Pt also tangental with conversation at times.            Hand Dominance        Extremity/Trunk Assessment    Upper extremity: Generalized weakness: grossly 3/5 strength in all movements, not specifically tested.   Lower extremity: generalized weakness:   Left LE details: Pt's ankle is lacking ~10* from neutral during PROM due to increased tone - unable to determine if he was able to achieve neutral with standing due to posterior lean.   L LE coordination: decreased fine motor; decreased gross motor   Trunk assessment: kyphotic            Communication  Arousal/Alertness: Awake/alert Behavior during therapy: flat affect  Cognition  Overall Cognitive status: Impaired from baseline, however no family/caregiver present to determine baseline cognition  Orientation level: disoriented to situation  Follows one step commands inconsistently  Decreased awareness of safety, decreased awareness of deficits  Problem solving: slowed processing, decreased initiation, difficulty sequencing, requires vc's and tc's                  General Comments      Exercises        Assessment/Plan   PT Assessment  PT diagnosis:   PT Diagnosis   difficulty walking, abnormality of gait, and generalized weakness  PT Problem List  Decreased strength, decreased ROM, decreased activity tolerance, decreased balance, decreased mobility, decreased coordination, decreased cognition, decreased knowledge of use of DME, decreased safety awareness, decreased knowledge of precautions   PT Treatment  Interventions   DME instruction, gait training, functional mobiltiy training, therapeutic activities, therapeutic exercise, balance training, neuromuscular re-education, cognitive remediation, patient/family education, and manual techniques  PT Goals (Current goals can be found in the Care Plan section) Acute Rehab PT Goals PT Goal Formulation: Patient unable to participate in goal setting Time For Goal Achievement: 03/13/16 Potential to Achieve Goals: Fair    Frequency  5x'wk   Barriers to discharge   decreased caregiver support      Co-evaluation               End of Session   Equipment used: gait belt Activity tolerance: pt tolerated tx well Pt left: in bed with call bell/phone in reach, bed alarm set               Time:  1551- 1610 PT Time Calculation: 19 min     Charges:     Acute PT Visit: 1 procedure  PT eval moderate: 1 procedure   PT G Codes:    Functional Assessment Tool Used: [employer] "6-clicks"  Functional Limitation: Mobility: Walking and moving around  Mobility: walking and moving around Current Status (Z6109): CL = At least 60% but less than 80% impaired, limited or restricted  Mobility: walking and moving around Goal status (U0454): CK = At least 40% but less than 60% impaired, limited or restricted    Beth Chrisotpher Rivero, PT, DPT X: [phone]

## 2016-03-06 NOTE — Clinical Social Work Placement (Signed)
   CLINICAL SOCIAL WORK PLACEMENT  NOTE  Date:  03/06/2016  Patient Details  Name: [name] MRN: [id number] Date of Birth: 07/06/1941  Clinical Social Work is seeking post-discharge placement for this patient at the Skilled  Nursing Facility level of care (*CSW will initial, date and re-position this form in  chart as items are completed):  Yes   Patient/family provided with [facility] Social Work Department's list of facilities offering this level of care within the geographic area requested by the patient (or if unable, by the patient's family).  Yes   Patient/family informed of their freedom to choose among providers that offer the needed level of care, that participate in Medicare, Medicaid or managed care program needed by the patient, have an available bed and are willing to accept the patient.  Yes   Patient/family informed of [facility]'s ownership interest in [facility] and [facility], as well as of the fact that they are under no obligation to receive care at these facilities.  PASRR submitted to EDS on       PASRR number received on       Existing PASRR number confirmed on 03/06/16     FL2 transmitted to all facilities in geographic area requested by pt/family on 03/06/16     FL2 transmitted to all facilities within larger geographic area on       Patient informed that his/her managed care company has contracts with or will negotiate with certain facilities, including the following:            Patient/family informed of bed offers received.  Patient chooses bed at       Physician recommends and patient chooses bed at      Patient to be transferred to   on  .  Patient to be transferred to facility by       Patient family notified on   of transfer.  Name of family member notified:        PHYSICIAN       Additional Comment:    _______________________________________________ Karn Cassis, LCSW 03/06/2016, 11:19  AM [phone]

## 2016-03-06 NOTE — NC FL2 (Signed)
[place] MEDICAID FL2 LEVEL OF CARE SCREENING TOOL     IDENTIFICATION  Patient Name: [name] Birthdate: Mar 18, 1941 Sex: male Admission Date (Current Location): 03/02/2016  [place] and [place] Number:  Aaron Edelman [id number] [facility] Facility and Address:  [facility],  [address] [place] [address]      Provider Number: [id number]  Attending Physician Name and Address:  Houston Siren, MD  Relative Name and Phone Number:       Current Level of Care: Hospital Recommended Level of Care: Skilled Nursing Facility Prior Approval Number:    Date Approved/Denied:   PASRR Number: [id number]  Discharge Plan: SNF    Current Diagnoses: Patient Active Problem List   Diagnosis Date Noted  . Altered mental status 03/02/2016  . Pressure ulcer 03/02/2016  . AKI (acute kidney injury) (HCC) 03/02/2016  . Hypernatremia 03/02/2016  . Elevated troponin 03/02/2016  . Dehydration 03/02/2016  . Rhabdomyolysis 03/02/2016  . Acute encephalopathy 03/02/2016  . Traumatic rhabdomyolysis (HCC)   . COPD (chronic obstructive pulmonary disease) (HCC) 10/08/2013  . COPD exacerbation (HCC) 10/08/2013  . Guaiac positive stools 08/18/2013  . Chest pain 01/31/2013  . PTSD (post-traumatic stress disorder) 01/31/2013  . Leukocytosis 01/31/2013  . Chest pain at rest 01/24/2013  . Paroxysmal atrial fibrillation (HCC) 09/04/2012  . Bilateral lower extremity edema 09/03/2012  . Thrombocytopenia (HCC) 09/03/2012  . Nausea & vomiting 09/02/2012  . Partial small bowel obstruction (HCC) 09/02/2012  . Cor pulmonale (HCC) 08/29/2012  . Acute-on-chronic respiratory failure (HCC) 08/29/2012  . Physical deconditioning 08/18/2012  . Chronic respiratory failure with hypoxia (HCC) 08/17/2012  . Esophageal dysmotility 07/04/2011  . Anemia 07/02/2011  . Hyponatremia 07/02/2011  . Depression 07/01/2011  . Anxiety 07/01/2011  . Smoker 07/01/2011  . COPD GOLD II with reversibility 06/30/2011  . DM  type 2 (diabetes mellitus, type 2) (HCC) 06/30/2011  . HTN (hypertension) 06/30/2011  . DDD (degenerative disc disease) 06/30/2011  . BPH (benign prostatic hyperplasia) 06/30/2011  . Dysphagia 06/30/2011    Orientation RESPIRATION BLADDER Height & Weight     Self  O2 (2 L) Incontinent Weight: 141 lb 8.6 oz (64.2 kg) Height:  5\' 8"  (172.7 cm)  BEHAVIORAL SYMPTOMS/MOOD NEUROLOGICAL BOWEL NUTRITION STATUS  Other (Comment) (n/a)  (n/a) Incontinent Diet (Dysphagia 1 with honey thick liquids)  AMBULATORY STATUS COMMUNICATION OF NEEDS Skin   Total Care  (some verbal communication) Other (Comment) (Stage 1 to sacrum with foam dressing)                       Personal Care Assistance Level of Assistance  Bathing, Feeding, Dressing Bathing Assistance: Maximum assistance Feeding assistance: Maximum assistance Dressing Assistance: Maximum assistance     Functional Limitations Info  Speech Sight Info: Adequate Hearing Info: Adequate Speech Info: Impaired    SPECIAL CARE FACTORS FREQUENCY  PT (By licensed PT), OT (By licensed OT), Speech therapy                    Contractures      Additional Factors Info  Psychotropic, Insulin Sliding Scale Code Status Info: Full code Allergies Info: Aspirin Psychotropic Info: Xanax, Ativan         Current Medications (03/06/2016):  This is the current hospital active medication list Current Facility-Administered Medications  Medication Dose Route Frequency Provider Last Rate Last Dose  . acetaminophen (TYLENOL) tablet 650 mg  650 mg Oral Q4H PRN Briscoe Deutscher, MD      .  ALPRAZolam Prudy Feeler) tablet 0.25 mg  0.25 mg Oral TID Briscoe Deutscher, MD   0.25 mg at 03/06/16 1110  . antiseptic oral rinse (CPC / CETYLPYRIDINIUM CHLORIDE 0.05%) solution 7 mL  7 mL Mouth Rinse BID Henderson Cloud, MD   7 mL at 03/06/16 1113  . aspirin EC tablet 81 mg  81 mg Oral Daily Briscoe Deutscher, MD   81 mg at 03/06/16 1111  . dextrose 5 % solution    Intravenous Continuous Houston Siren, MD 100 mL/hr at 03/06/16 0057    . folic acid injection 1 mg  1 mg Intravenous Daily Lavone Neri Opyd, MD   1 mg at 03/06/16 1220  . heparin injection 5,000 Units  5,000 Units Subcutaneous Q8H Briscoe Deutscher, MD   Stopped at 03/06/16 0600  . insulin aspart (novoLOG) injection 0-9 Units  0-9 Units Subcutaneous Q4H Briscoe Deutscher, MD   2 Units at 03/05/16 2136  . ipratropium-albuterol (DUONEB) 0.5-2.5 (3) MG/3ML nebulizer solution 3 mL  3 mL Nebulization Q4H PRN Briscoe Deutscher, MD      . LORazepam (ATIVAN) injection 2-3 mg  2-3 mg Intravenous Q1H PRN Briscoe Deutscher, MD   2 mg at 03/03/16 2249  . metoprolol tartrate (LOPRESSOR) tablet 12.5 mg  12.5 mg Oral BID Briscoe Deutscher, MD   12.5 mg at 03/06/16 1110  . mometasone-formoterol (DULERA) 200-5 MCG/ACT inhaler 2 puff  2 puff Inhalation BID Briscoe Deutscher, MD   2 puff at 03/06/16 0752  . nitroGLYCERIN (NITROSTAT) SL tablet 0.4 mg  0.4 mg Sublingual Q5 Min x 3 PRN Lavone Neri Opyd, MD      . ondansetron (ZOFRAN) injection 4 mg  4 mg Intravenous Q6H PRN Briscoe Deutscher, MD      . pantoprazole (PROTONIX) EC tablet 40 mg  40 mg Oral Daily Estela Isaiah Blakes, MD   40 mg at 03/06/16 1110  . PARoxetine (PAXIL) tablet 20 mg  20 mg Oral Daily Briscoe Deutscher, MD   20 mg at 03/06/16 1110  . sodium chloride 0.9 % bolus 1,000 mL  1,000 mL Intravenous Once Samuel Jester, DO      . sodium chloride 0.9 % bolus 500 mL  500 mL Intravenous Once Samuel Jester, DO      . tamsulosin (FLOMAX) capsule 0.4 mg  0.4 mg Oral QPC supper Briscoe Deutscher, MD   0.4 mg at 03/05/16 2133  . thiamine (B-1) injection 100 mg  100 mg Intravenous Daily Briscoe Deutscher, MD   100 mg at 03/06/16 1110  . tiotropium (SPIRIVA) inhalation capsule 18 mcg  18 mcg Inhalation Daily Briscoe Deutscher, MD   18 mcg at 03/06/16 0751  . Vitamin D (Ergocalciferol) (DRISDOL) capsule 50,000 Units  50,000 Units Oral Q7 days Briscoe Deutscher, MD   50,000 Units at 03/03/16 1610      Discharge Medications: Please see discharge summary for a list of discharge medications.  Relevant Imaging Results:  Relevant Lab Results:   Additional Information SSN: [id number]  Karn Cassis, [place] [phone]

## 2016-03-07 LAB — BASIC METABOLIC PANEL
ANION GAP: 5 (ref 5–15)
BUN: 32 mg/dL — ABNORMAL HIGH (ref 6–20)
CALCIUM: 8.3 mg/dL — AB (ref 8.9–10.3)
CO2: 26 mmol/L (ref 22–32)
Chloride: 109 mmol/L (ref 101–111)
Creatinine, Ser: 1.61 mg/dL — ABNORMAL HIGH (ref 0.61–1.24)
GFR, EST AFRICAN AMERICAN: 47 mL/min — AB (ref >60)
GFR, EST NON AFRICAN AMERICAN: 41 mL/min — AB (ref >60)
Glucose, Bld: 132 mg/dL — ABNORMAL HIGH (ref 65–99)
Potassium: 3.3 mmol/L — ABNORMAL LOW (ref 3.5–5.1)
Sodium: 140 mmol/L (ref 135–145)

## 2016-03-07 LAB — CULTURE, BLOOD (ROUTINE X 2)
CULTURE: NO GROWTH
CULTURE: NO GROWTH

## 2016-03-07 LAB — GLUCOSE, CAPILLARY
GLUCOSE-CAPILLARY: 108 mg/dL — AB (ref 65–99)
GLUCOSE-CAPILLARY: 130 mg/dL — AB (ref 65–99)
Glucose-Capillary: 139 mg/dL — ABNORMAL HIGH (ref 65–99)
Glucose-Capillary: 133 mg/dL — ABNORMAL HIGH (ref 65–99)
Glucose-Capillary: 105 mg/dL — ABNORMAL HIGH (ref 65–99)

## 2016-03-07 MED ORDER — ASPIRIN 81 MG PO TBEC: 81.0000 mg | DELAYED_RELEASE_TABLET | Freq: Every day | ORAL | 3 refills | Status: DC

## 2016-03-07 MED ORDER — VITAMIN B-1 100 MG PO TABS
100.0000 mg | ORAL_TABLET | Freq: Every day | ORAL | Status: DC
  Administered 2016-03-07: 100 mg via ORAL

## 2016-03-07 MED ORDER — FOLIC ACID 1 MG PO TABS
1.0000 mg | ORAL_TABLET | Freq: Every day | ORAL | Status: DC
  Administered 2016-03-07: 1 mg via ORAL

## 2016-03-07 NOTE — Care Management Note (Signed)
Case Management Note  Patient Details  Name: [name] MRN: [id number] Date of Birth: 07/25/1941  03/07/2016, 11:14 AM Additional Comments: Patient DC to SNF today. CSW making arrangements.

## 2016-03-07 NOTE — Clinical Social Work Placement (Signed)
   CLINICAL SOCIAL WORK PLACEMENT  NOTE  Date:  03/07/2016  Patient Details  Name: [name] MRN: [id number] Date of Birth: Apr 30, 1941  Clinical Social Work is seeking post-discharge placement for this patient at the Skilled  Nursing Facility level of care (*CSW will initial, date and re-position this form in  chart as items are completed):  Yes   Patient/family provided with [facility] Clinical Social Work Department's list of facilities offering this level of care within the geographic area requested by the patient (or if unable, by the patient's family).  Yes   Patient/family informed of their freedom to choose among providers that offer the needed level of care, that participate in Medicare, Medicaid or managed care program needed by the patient, have an available bed and are willing to accept the patient.  Yes   Patient/family informed of [facility]'s ownership interest in [facility] and [facility], as well as of the fact that they are under no obligation to receive care at these facilities.  PASRR submitted to EDS on       PASRR number received on       Existing PASRR number confirmed on 03/06/16     FL2 transmitted to all facilities in geographic area requested by pt/family on 03/06/16     FL2 transmitted to all facilities within larger geographic area on       Patient informed that his/her managed care company has contracts with or will negotiate with certain facilities, including the following:        Yes   Patient/family informed of bed offers received.  Patient chooses bed at [facility]     Physician recommends and patient chooses bed at      Patient to be transferred to [facility] on 03/07/16.  Patient to be transferred to facility by facility [facility]     Patient family notified on 03/07/16 of transfer.  Name of family member notified:  [name]- son.     PHYSICIAN       Additional Comment:  Irine Seal at [facility] DSS  notified of d/c plan.   _______________________________________________ Karn Cassis, LCSW 03/07/2016, 11:37 AM [phone]

## 2016-03-07 NOTE — Discharge Summary (Signed)
Physician Discharge Summary  [name] KLK:[id number] DOB: 22-Feb-1941 DOA: 03/02/2016  PCP: Brandon Hector, MD  Admit date: 03/02/2016 Discharge date: 03/07/2016  Time spent: 35 minutes  Recommendations for Outpatient Follow-up:  1. Patient will follow up with PCP next week.   Discharge Diagnoses:  Principal Problem:   Altered mental status Active Problems:   DM type 2 (diabetes mellitus, type 2) (HCC)   HTN (hypertension)   Depression   Chronic respiratory failure with hypoxia (HCC)   Paroxysmal atrial fibrillation (HCC)   Leukocytosis   COPD (chronic obstructive pulmonary disease) (HCC)   Pressure ulcer   AKI (acute kidney injury) (HCC)   Hypernatremia   Elevated troponin   Dehydration   Rhabdomyolysis   Acute encephalopathy   Traumatic rhabdomyolysis (HCC)   Discharge Condition: alert, confused, able to eat D1 HTL, Cr improved to 1.6.   Diet recommendation:  D1 HTL  Filed Weights   03/04/16 0500 03/05/16 1106 03/07/16 0500  Weight: 64.2 kg (141 lb 8.6 oz) 64.2 kg (141 lb 8.6 oz) 63.3 kg (139 lb 8 oz)    History of present illness:  Patient was admitted found being down, with AKI, rhabdo, encephalopathy on March 02, 2016 by Dr [name].  As per his H and P:  " HPI: [name] is a 75 y.o. male with medical history significant for alcoholism, depression, anxiety, COPD with chronic hypoxic respiratory failure, and hypertension who presents to the emergency department after being found down at his home by a friend. Patient's friend reports that he had not seen or heard from the patient and at least one month and went to his house to check on him today. He reportedly found the patient lying on the floor under his hospital bed with his O2 canister tipped over. Patient's friend initially thought that the patient had expired, however, he opened his eyes to stimulation. Per EMS report, he was found to be soiled in urine and appeared to been down for at least a day.  There were no signs of trauma.  ED Course: Upon arrival to the ED, patient is found to be afebrile, saturating well on 2 L/m supplemental oxygen, tachycardic in the 110s, and with stable blood pressure. EKG demonstrates a sinus tachycardia with anteroseptal Q waves and nonspecific ST changes diffusely. Chest x-ray is negative for acute cardiopulmonary disease. CMP is notable for sodium of 154, chloride 120, and serum creatinine of 4.10, up from 1.2 two years prior. CBC features a leukocytosis to 22,600. Urinalysis features and elevated specific gravity but is otherwise unremarkable. UDS is positive for benzodiazepines only, ethanol level was undetectable, salicylates and Tylenol levels are also undetectable. Troponin returned elevated to a value of 0.14 and INR is normal. Lactic acid is reassuring at 1.5. Serum CK is elevated to 2189. Patient was given 2 L of normal saline and DuoNeb in the emergency department and urine culture was sent. He remained mildly tachycardic, but was stable blood pressure in the ED. He remained nonverbal in the emergency department but there were no focal deficits observed. He'll be admitted to the stepdown unit for ongoing evaluation and management of acute encephalopathy with rhabdomyolysis and acute renal failure, as well as possible sepsis with unknown source and elevated troponin concerning for possible ACS.   Hospital Course:  34 yom with a hx of alcoholism, depression, anxiety, COPD with chronic hypoxic respiratory failure, diabetes, and HTN presents with complaints of being down for unclear number of days.  While in  the ED patient was found to be hypernatremic with elevated creatine and troponin. He was found to have rhabdo and AKI.  He was admitted and was given IVF. Since admission, he has been more alert, and has improved.  Speech was able to test and recommended dysphagia I diet with honey thicken liquid.  His Cr has improved from about 4 to now 1.6. CPK has  improved from 791 from 2K range. His BC, HIV, and NH3 levels were negative.  He is stable for discharge, however, he will need to go to SNF as he cannot return home safely at this time.  With respect to his hypernatremia, it was clearly dehydration, and with IVF, it corrected from 154 mE/L on admission to 140 mEq/L today.  He will follow up with his PCP in one week.  Thank you for allowing me to participate in his care.  Good Day.   Discharge Exam: Vitals:   03/06/16 2026 03/07/16 0500  BP: 127/60 (!) 163/59  Pulse: 66 62  Resp: 18 18  Temp: 98 F (36.7 C) 98.2 F (36.8 C)    Discharge Instructions:  Follow up with PCP in one week.    Discharge Instructions    Diet - low sodium heart healthy    Complete by:  As directed   Increase activity slowly    Complete by:  As directed     Current Discharge Medication List    START taking these medications   Details  aspirin EC 81 MG EC tablet Take 1 tablet (81 mg total) by mouth daily. Qty: 30 tablet, Refills: 3      CONTINUE these medications which have NOT CHANGED   Details  albuterol (PROVENTIL HFA;VENTOLIN HFA) 108 (90 BASE) MCG/ACT inhaler Inhale 2 puffs into the lungs every 6 (six) hours as needed for wheezing.    atorvastatin (LIPITOR) 40 MG tablet Take 40 mg by mouth daily at 6 PM.    budesonide-formoterol (SYMBICORT) 160-4.5 MCG/ACT inhaler Inhale 2 puffs into the lungs daily.    ipratropium-albuterol (DUONEB) 0.5-2.5 (3) MG/3ML SOLN Take 3 mLs by nebulization 4 (four) times daily.    iron polysaccharides (NIFEREX) 150 MG capsule Take 150 mg by mouth daily.    metoprolol (LOPRESSOR) 50 MG tablet Take 50 mg by mouth 2 (two) times daily.    pantoprazole (PROTONIX) 40 MG tablet Take 40 mg by mouth daily.    PARoxetine (PAXIL) 20 MG tablet Take 1 tablet by mouth daily.    Tamsulosin HCl (FLOMAX) 0.4 MG CAPS Take 1 capsule (0.4 mg total) by mouth daily after supper. Qty: 30 capsule, Refills: 0    tiotropium (SPIRIVA) 18  MCG inhalation capsule Place 18 mcg into inhaler and inhale daily.      STOP taking these medications     ALPRAZolam (XANAX) 0.25 MG tablet      [employer] VITAMIN B12 1000 MCG tablet      furosemide (LASIX) 20 MG tablet      glimepiride (AMARYL) 4 MG tablet      metFORMIN (GLUCOPHAGE) 1000 MG tablet      oxazepam (SERAX) 10 MG capsule      potassium chloride SA (K-DUR,KLOR-CON) 20 MEQ tablet      traMADol (ULTRAM) 50 MG tablet      traZODone (DESYREL) 50 MG tablet      OXYGEN-HELIUM IN      Vitamin D, Ergocalciferol, (DRISDOL) 50000 UNITS CAPS capsule        Allergies  Allergen Reactions  .  Aspirin Other (See Comments)    Stomach upset      The results of significant diagnostics from this hospitalization (including imaging, microbiology, ancillary and laboratory) are listed below for reference.    Significant Diagnostic Studies: Ct Head Wo Contrast  Result Date: 03/02/2016 CLINICAL DATA:  Patient found under bad at home by a friend. Nonverbal. EXAM: CT HEAD WITHOUT CONTRAST TECHNIQUE: Contiguous axial images were obtained from the base of the skull through the vertex without intravenous contrast. COMPARISON:  02/08/2016 CT. FINDINGS: Brain: No evidence of acute infarction, hemorrhage, extra-axial collection, ventriculomegaly, or mass effect. Moderate atrophy, advanced for age. Hypoattenuation of white matter is suspected, likely chronic microvascular ischemic change. Vascular: Hyperdense RIGHT vertebral felt to represent atheromatous change rather than thrombosis. Moderate vascular calcification in the carotid siphon regions. Skull: Negative for fracture or focal lesion. Sinuses/Orbits: No acute findings. Other: Calcific pannus surrounds the odontoid. 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 Retracted LEFT TM. 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 If  further investigation desired, consider elective ENT consultation. Compared with priors, similar appearance. IMPRESSION: No acute intracranial findings.  Atrophy and small vessel disease. No change from priors. Electronically Signed   By: Elsie Stain M.D.   On: 03/02/2016 16:59  US Renal  Result Date: 03/03/2016 CLINICAL DATA:  Acute renal failure and sepsis. EXAM: RENAL / URINARY TRACT ULTRASOUND COMPLETE COMPARISON:  CT 01/06/2016 FINDINGS: Right Kidney: Length: 8.5 cm. Echogenicity within normal limits. No mass or hydronephrosis visualized. Left Kidney: Length: 9.2 cm. Echogenicity within normal limits. No mass or hydronephrosis visualized. Bladder: Not imaged. IMPRESSION: No acute urinary pathology.  No evidence of hydronephrosis. Electronically Signed   By: Jolaine Click M.D.   On: 03/03/2016 11:40  Dg Chest Port 1 View  Result Date: 03/02/2016 CLINICAL DATA:  Sepsis. EXAM: PORTABLE CHEST 1 VIEW COMPARISON:  Portable chest radiograph earlier this day at 1521 hour FINDINGS: Lower lung volumes from prior. Heart size and mediastinal contours are unchanged. Mild elevation of right hemidiaphragm likely related to lower lung volumes. Upper lobe emphysema. Bibasilar opacities likely scarring. No confluent airspace disease to suggest pneumonia. No evidence of pleural effusion. No pneumothorax. IMPRESSION: Lower lung volumes from prior exam. Upper lobe emphysema. Stable bibasilar scarring. Electronically Signed   By: Rubye Oaks M.D.   On: 03/02/2016 19:45  Dg Chest Portable 1 View  Result Date: 03/02/2016 CLINICAL DATA:  Rhonchi.  Abnormal breath sounds. EXAM: PORTABLE CHEST 1 VIEW COMPARISON:  02/10/2016 FINDINGS: Heart is normal size. Continued increased markings at the lung bases, likely scarring, stable. No confluent opacities or effusions. No acute bony abnormality. IMPRESSION: Bibasilar scarring, stable.  No active disease. Electronically Signed   By: Charlett Nose M.D.   On: 03/02/2016  15:33   Microbiology: Recent Results (from the past 240 hour(s))  Urine culture     Status: None   Collection Time: 03/02/16  3:20 PM  Result Value Ref Range Status   Specimen Description URINE, CATHETERIZED  Final   Special Requests NONE  Final   Culture NO GROWTH Performed at [facility]   Final   Report Status 03/04/2016 FINAL  Final  Culture, blood (x 2)     Status: None   Collection Time: 03/02/16  6:50 PM  Result Value Ref Range Status   Specimen Description BLOOD LEFT ARM  Final   Special Requests BOTTLES DRAWN AEROBIC AND ANAEROBIC [facility] EACH  Final  Culture NO GROWTH 5 DAYS  Final   Report Status 03/07/2016 FINAL  Final  MRSA PCR Screening     Status: None   Collection Time: 03/02/16  9:00 PM  Result Value Ref Range Status   MRSA by PCR NEGATIVE NEGATIVE Final    Comment:        The GeneXpert MRSA Assay (FDA approved for NASAL specimens only), is one component of a comprehensive MRSA colonization surveillance program. It is not intended to diagnose MRSA infection nor to guide or monitor treatment for MRSA infections.   Culture, blood (x 2)     Status: None   Collection Time: 03/02/16  9:22 PM  Result Value Ref Range Status   Specimen Description BLOOD LEFT ANTECUBITAL  Final   Special Requests BOTTLES DRAWN AEROBIC AND ANAEROBIC 8CC  Final   Culture NO GROWTH 5 DAYS  Final   Report Status 03/07/2016 FINAL  Final     Labs: Basic Metabolic Panel:  Recent Labs Lab 03/03/16 1547 03/04/16 0414 03/05/16 0451 03/06/16 0602 03/07/16 0549  NA 153* 153* 152* 145 140  K 4.6 3.9 3.7 3.5 3.3*  CL 122* 123* 121* 114* 109  CO2 23 25 22 27 26   GLUCOSE 94 73 113* 104* 132*  BUN 112* 100* 72* 49* 32*  CREATININE 3.70* 3.16* 2.29* 1.86* 1.61*  CALCIUM 8.7* 8.5* 8.9 8.5* 8.3*   Liver Function Tests:  Recent Labs Lab 03/02/16 1549  AST 55*  ALT 33  ALKPHOS 96  BILITOT 1.1  PROT 8.3*  ALBUMIN 4.4    Recent Labs Lab 03/02/16 2122  AMMONIA 18    CBC:  Recent Labs Lab 03/02/16 1549 03/02/16 2122 03/03/16 0219 03/04/16 0414  WBC 22.6*  --  17.0* 11.0*  NEUTROABS 18.4*  --  13.2*  --   HGB 14.1  --  11.8* 10.7*  HCT 42.9 37.1* 36.9* 33.1*  MCV 89.2  --  90.0 90.7  PLT 306  --  257 171   Cardiac Enzymes:  Recent Labs Lab 03/02/16 1549 03/02/16 1850 03/03/16 0219 03/03/16 0727 03/05/16 0451  CKTOTAL 2,189*  --   --   --  791*  TROPONINI 0.14* 0.13* 0.14* 0.12*  --    CBG:  Recent Labs Lab 03/06/16 1636 03/06/16 2143 03/07/16 0011 03/07/16 0404 03/07/16 0724  GLUCAP 155* 139* 130* 133* 105*    Signed:  Alleya Demeter MD. Jerrel Ivory. [facility] 03/07/2016, 10:47 AM

## 2016-03-07 NOTE — Progress Notes (Signed)
Discharged to [facility] in stable condition.

## 2016-03-20 ENCOUNTER — Observation Stay (HOSPITAL_COMMUNITY)
Admission: AD | Admit: 2016-03-20 | Discharge: 2016-03-25 | Disposition: A | Discharge status: Home / Self Care | Payer: Medicare Other | Source: Other Acute Inpatient Hospital | Attending: Cardiovascular Disease | Admitting: Cardiovascular Disease

## 2016-03-20 DIAGNOSIS — F17210 Nicotine dependence, cigarettes, uncomplicated: Secondary | ICD-10-CM | POA: Insufficient documentation

## 2016-03-20 DIAGNOSIS — I10 Essential (primary) hypertension: Secondary | ICD-10-CM | POA: Diagnosis present

## 2016-03-20 DIAGNOSIS — J9611 Chronic respiratory failure with hypoxia: Secondary | ICD-10-CM | POA: Diagnosis present

## 2016-03-20 DIAGNOSIS — Z7982 Long term (current) use of aspirin: Secondary | ICD-10-CM | POA: Insufficient documentation

## 2016-03-20 DIAGNOSIS — R748 Abnormal levels of other serum enzymes: Secondary | ICD-10-CM | POA: Insufficient documentation

## 2016-03-20 DIAGNOSIS — M6282 Rhabdomyolysis: Secondary | ICD-10-CM | POA: Diagnosis not present

## 2016-03-20 DIAGNOSIS — J449 Chronic obstructive pulmonary disease, unspecified: Secondary | ICD-10-CM | POA: Diagnosis not present

## 2016-03-20 DIAGNOSIS — F4310 Post-traumatic stress disorder, unspecified: Secondary | ICD-10-CM | POA: Diagnosis not present

## 2016-03-20 DIAGNOSIS — R079 Chest pain, unspecified: Secondary | ICD-10-CM | POA: Diagnosis present

## 2016-03-20 DIAGNOSIS — E119 Type 2 diabetes mellitus without complications: Secondary | ICD-10-CM | POA: Diagnosis not present

## 2016-03-20 DIAGNOSIS — I129 Hypertensive chronic kidney disease with stage 1 through stage 4 chronic kidney disease, or unspecified chronic kidney disease: Secondary | ICD-10-CM | POA: Diagnosis not present

## 2016-03-20 DIAGNOSIS — R778 Other specified abnormalities of plasma proteins: Secondary | ICD-10-CM | POA: Diagnosis present

## 2016-03-20 DIAGNOSIS — Z79899 Other long term (current) drug therapy: Secondary | ICD-10-CM | POA: Diagnosis not present

## 2016-03-20 DIAGNOSIS — F329 Major depressive disorder, single episode, unspecified: Secondary | ICD-10-CM | POA: Insufficient documentation

## 2016-03-20 DIAGNOSIS — N183 Chronic kidney disease, stage 3 unspecified: Secondary | ICD-10-CM | POA: Diagnosis present

## 2016-03-20 DIAGNOSIS — F32A Depression, unspecified: Secondary | ICD-10-CM | POA: Diagnosis present

## 2016-03-20 DIAGNOSIS — I252 Old myocardial infarction: Secondary | ICD-10-CM | POA: Insufficient documentation

## 2016-03-20 DIAGNOSIS — R7989 Other specified abnormal findings of blood chemistry: Secondary | ICD-10-CM | POA: Diagnosis present

## 2016-03-21 ENCOUNTER — Observation Stay (HOSPITAL_COMMUNITY): Payer: Medicare Other

## 2016-03-21 ENCOUNTER — Observation Stay (HOSPITAL_BASED_OUTPATIENT_CLINIC_OR_DEPARTMENT_OTHER): Payer: Medicare Other

## 2016-03-21 DIAGNOSIS — R079 Chest pain, unspecified: Secondary | ICD-10-CM

## 2016-03-21 DIAGNOSIS — N183 Chronic kidney disease, stage 3 unspecified: Secondary | ICD-10-CM | POA: Diagnosis present

## 2016-03-21 DIAGNOSIS — N2889 Other specified disorders of kidney and ureter: Secondary | ICD-10-CM | POA: Diagnosis present

## 2016-03-21 DIAGNOSIS — J449 Chronic obstructive pulmonary disease, unspecified: Secondary | ICD-10-CM | POA: Diagnosis not present

## 2016-03-21 LAB — TROPONIN I
Troponin I: <0.03 ng/mL (ref <0.03)
Troponin I: <0.03 ng/mL (ref <0.03)

## 2016-03-21 LAB — NM MYOCAR MULTI W/SPECT W/WALL MOTION / EF: Rest HR: 62 {beats}/min

## 2016-03-21 LAB — COMPREHENSIVE METABOLIC PANEL
ALBUMIN: 2.9 g/dL — AB (ref 3.5–5.0)
ALK PHOS: 79 U/L (ref 38–126)
ALT: 42 U/L (ref 17–63)
AST: 29 U/L (ref 15–41)
Anion gap: 8 (ref 5–15)
BILIRUBIN TOTAL: 0.3 mg/dL (ref 0.3–1.2)
BUN: 33 mg/dL — AB (ref 6–20)
CALCIUM: 8.6 mg/dL — AB (ref 8.9–10.3)
CO2: 28 mmol/L (ref 22–32)
CREATININE: 1.54 mg/dL — AB (ref 0.61–1.24)
Chloride: 105 mmol/L (ref 101–111)
GFR calc Af Amer: 50 mL/min — ABNORMAL LOW (ref >60)
GFR calc non Af Amer: 43 mL/min — ABNORMAL LOW (ref >60)
GLUCOSE: 148 mg/dL — AB (ref 65–99)
Potassium: 4.2 mmol/L (ref 3.5–5.1)
SODIUM: 141 mmol/L (ref 135–145)
TOTAL PROTEIN: 5.7 g/dL — AB (ref 6.5–8.1)

## 2016-03-21 LAB — CBC
HEMATOCRIT: 25.9 % — AB (ref 39.0–52.0)
HEMOGLOBIN: 8.3 g/dL — AB (ref 13.0–17.0)
MCH: 28.4 pg (ref 26.0–34.0)
MCHC: 32 g/dL (ref 30.0–36.0)
MCV: 88.7 fL (ref 78.0–100.0)
Platelets: 181 10*3/uL (ref 150–400)
RBC: 2.92 MIL/uL — AB (ref 4.22–5.81)
RDW: 15.4 % (ref 11.5–15.5)
WBC: 9.1 10*3/uL (ref 4.0–10.5)

## 2016-03-21 LAB — GLUCOSE, CAPILLARY
GLUCOSE-CAPILLARY: 121 mg/dL — AB (ref 65–99)
GLUCOSE-CAPILLARY: 92 mg/dL (ref 65–99)
GLUCOSE-CAPILLARY: 116 mg/dL — AB (ref 65–99)
GLUCOSE-CAPILLARY: 134 mg/dL — AB (ref 65–99)

## 2016-03-21 LAB — MRSA PCR SCREENING: MRSA BY PCR: NEGATIVE

## 2016-03-21 LAB — TSH: TSH: 1.736 u[IU]/mL (ref 0.350–4.500)

## 2016-03-21 LAB — MAGNESIUM: Magnesium: 1.7 mg/dL (ref 1.7–2.4)

## 2016-03-21 MED ORDER — TECHNETIUM TC 99M TETROFOSMIN IV KIT
10.0000 | PACK | Freq: Once | INTRAVENOUS | Status: AC | PRN
  Administered 2016-03-21: 10 via INTRAVENOUS

## 2016-03-21 MED ORDER — MOMETASONE FURO-FORMOTEROL FUM 200-5 MCG/ACT IN AERO
2.0000 | INHALATION_SPRAY | Freq: Two times a day (BID) | RESPIRATORY_TRACT | Status: DC
  Administered 2016-03-21 – 2016-03-24 (×6): 2 via RESPIRATORY_TRACT
  Filled 2016-03-21 (×2): qty 8.8

## 2016-03-21 MED ORDER — REGADENOSON 0.4 MG/5ML IV SOLN
0.4000 mg | Freq: Once | INTRAVENOUS | Status: AC
  Administered 2016-03-21: 0.4 mg via INTRAVENOUS

## 2016-03-21 MED ORDER — TIOTROPIUM BROMIDE MONOHYDRATE 18 MCG IN CAPS
18.0000 ug | ORAL_CAPSULE | Freq: Every day | RESPIRATORY_TRACT | Status: DC
Start: 2016-03-21 — End: 2016-03-25
  Administered 2016-03-22 – 2016-03-24 (×3): 18 ug via RESPIRATORY_TRACT
  Filled 2016-03-21 (×2): qty 5

## 2016-03-21 MED ORDER — METOPROLOL TARTRATE 50 MG PO TABS
50.0000 mg | ORAL_TABLET | Freq: Two times a day (BID) | ORAL | Status: DC
  Administered 2016-03-21 – 2016-03-25 (×9): 50 mg via ORAL
  Filled 2016-03-21 (×9): qty 1

## 2016-03-21 MED ORDER — REGADENOSON 0.4 MG/5ML IV SOLN
INTRAVENOUS | Status: AC
  Filled 2016-03-21: qty 5

## 2016-03-21 MED ORDER — PANTOPRAZOLE SODIUM 40 MG PO TBEC
40.0000 mg | DELAYED_RELEASE_TABLET | Freq: Every day | ORAL | Status: DC
  Administered 2016-03-21 – 2016-03-25 (×5): 40 mg via ORAL
  Filled 2016-03-21 (×5): qty 1

## 2016-03-21 MED ORDER — TECHNETIUM TC 99M TETROFOSMIN IV KIT
30.0000 | PACK | Freq: Once | INTRAVENOUS | Status: AC | PRN
  Administered 2016-03-21: 30 via INTRAVENOUS

## 2016-03-21 MED ORDER — IPRATROPIUM-ALBUTEROL 0.5-2.5 (3) MG/3ML IN SOLN
3.0000 mL | Freq: Four times a day (QID) | RESPIRATORY_TRACT | Status: DC
  Administered 2016-03-21 – 2016-03-24 (×13): 3 mL via RESPIRATORY_TRACT
  Filled 2016-03-21 (×13): qty 3

## 2016-03-21 MED ORDER — ALBUTEROL SULFATE (2.5 MG/3ML) 0.083% IN NEBU: 3.0000 mL | INHALATION_SOLUTION | Freq: Four times a day (QID) | RESPIRATORY_TRACT | Status: DC | PRN

## 2016-03-21 MED ORDER — POLYSACCHARIDE IRON COMPLEX 150 MG PO CAPS
150.0000 mg | ORAL_CAPSULE | Freq: Every day | ORAL | Status: DC
  Administered 2016-03-21 – 2016-03-25 (×5): 150 mg via ORAL
  Filled 2016-03-21 (×5): qty 1

## 2016-03-21 MED ORDER — ASPIRIN EC 81 MG PO TBEC
81.0000 mg | DELAYED_RELEASE_TABLET | Freq: Every day | ORAL | Status: DC
  Administered 2016-03-21 – 2016-03-23 (×3): 81 mg via ORAL
  Filled 2016-03-21 (×3): qty 1

## 2016-03-21 MED ORDER — ATORVASTATIN CALCIUM 40 MG PO TABS
40.0000 mg | ORAL_TABLET | Freq: Every day | ORAL | Status: DC
  Administered 2016-03-21 – 2016-03-23 (×3): 40 mg via ORAL
  Filled 2016-03-21 (×3): qty 1

## 2016-03-21 MED ORDER — PAROXETINE HCL 20 MG PO TABS
20.0000 mg | ORAL_TABLET | Freq: Every day | ORAL | Status: DC
  Administered 2016-03-21 – 2016-03-25 (×5): 20 mg via ORAL
  Filled 2016-03-21 (×5): qty 1

## 2016-03-21 MED ORDER — ENOXAPARIN SODIUM 40 MG/0.4ML ~~LOC~~ SOLN
40.0000 mg | SUBCUTANEOUS | Status: DC
  Administered 2016-03-21 – 2016-03-23 (×3): 40 mg via SUBCUTANEOUS
  Filled 2016-03-21 (×3): qty 0.4

## 2016-03-21 MED ORDER — TAMSULOSIN HCL 0.4 MG PO CAPS
0.4000 mg | ORAL_CAPSULE | Freq: Every day | ORAL | Status: DC
  Administered 2016-03-21 – 2016-03-23 (×3): 0.4 mg via ORAL
  Filled 2016-03-21 (×3): qty 1

## 2016-03-21 NOTE — Care Management Note (Signed)
Case Management Note  Patient Details  Name: [name] MRN: [id number] Date of Birth: 07/05/1941  Subjective/Objective:       Pt admitted with chest pain              Action/Plan:  [facility] is from [facility] in [place] - plan is for pt to discharge back to facility when medically stable.  CSW consulted.  CM will continue to follow for discharge needs   Expected Discharge Date:                  Expected Discharge Plan:     In-House Referral:     Discharge planning Services     Post Acute Care Choice:    Choice offered to:     DME Arranged:    DME Agency:     HH Arranged:    HH Agency:     Status of Service:     If discussed at [employer] Length of Stay Meetings, dates discussed:    Additional Comments:  [name], Brandon Goley S, RN 03/21/2016, 11:29 AM

## 2016-03-21 NOTE — Progress Notes (Signed)
Subjective:  Awake and alert, no chest pain this am  Objective:  Vital Signs in the last 24 hours: Temp:  [97.9 F (36.6 C)-98.5 F (36.9 C)] 97.9 F (36.6 C) (08/18 0532) Pulse Rate:  [70-77] 70 (08/18 0532) Resp:  [18] 18 (08/18 0532) BP: (136-157)/(52-58) 157/58 (08/18 0532) SpO2:  [97 %-100 %] 100 % (08/18 0532) Weight:  [142 lb (64.4 kg)-142 lb 9.6 oz (64.7 kg)] 142 lb (64.4 kg) (08/18 0532)  Intake/Output from previous day:  Intake/Output Summary (Last 24 hours) at 03/21/16 0904 Last data filed at 03/20/16 2321  Gross per 24 hour  Intake                0 ml  Output              200 ml  Net             -200 ml    Physical Exam: General appearance: alert, cooperative, cachectic and no distress Lungs: decreasedbreath sounds Heart: regular rate and rhythm Skin: Skin color, texture, turgor normal. No rashes or lesions Neurologic: Grossly normal   Rate: 80  Rhythm: normal sinus rhythm  Lab Results:  Recent Labs  03/21/16 0050  WBC 9.1  HGB 8.3*  PLT 181    Recent Labs  03/21/16 0050  NA 141  K 4.2  CL 105  CO2 28  GLUCOSE 148*  BUN 33*  CREATININE 1.54*    Recent Labs  03/21/16 0050 03/21/16 0656  TROPONINI <0.03 <0.03   No results for input(s): INR in the last 72 hours.  Scheduled Meds: . aspirin EC  81 mg Oral Daily  . atorvastatin  40 mg Oral q1800  . enoxaparin (LOVENOX) injection  40 mg Subcutaneous Q24H  . ipratropium-albuterol  3 mL Nebulization QID  . iron polysaccharides  150 mg Oral Daily  . metoprolol  50 mg Oral BID  . mometasone-formoterol  2 puff Inhalation BID  . pantoprazole  40 mg Oral Daily  . PARoxetine  20 mg Oral Daily  . tamsulosin  0.4 mg Oral QPC supper  . tiotropium  18 mcg Inhalation Daily   Continuous Infusions:  PRN Meds:.albuterol   Imaging: X-ray Chest Pa And Lateral  Result Date: 03/21/2016 CLINICAL DATA:  Chest pain.  Shortness of breath . EXAM: CHEST  2 VIEW COMPARISON:  03/20/2016. FINDINGS:  Mediastinum hilar structures normal. Heart size stable. Low lung volumes with mild bibasilar atelectasis. No pleural effusion or pneumothorax. IMPRESSION: Low lung volumes with mild bibasilar and/or infiltrates again noted. Bibasilar pneumonia cannot be excluded. Similar findings noted on prior exam . Electronically Signed   By: Maisie Fushomas  Register   On: 03/21/2016 07:00    Cardiac Studies: Echo 03/03/16 Study Conclusions  - Left ventricle: The cavity size was normal. Wall thickness was   increased increased in a pattern of mild to moderate LVH.   Systolic function was normal. The estimated ejection fraction was   in the range of 60% to 65%. Wall motion was normal; there were no   regional wall motion abnormalities. Doppler parameters are   consistent with abnormal left ventricular relaxation (grade 1   diastolic dysfunction). - Aortic valve: Mildly calcified annulus. Trileaflet. - Mitral valve: Calcified annulus. There was trivial regurgitation. - Right atrium: Central venous pressure (est): 3 mm Hg. - Tricuspid valve: There was trivial regurgitation. - Pulmonary arteries: PA peak pressure: 30 mm Hg (S). - Pericardium, extracardiac: A prominent pericardial fat pad was  present.  Impressions:  - Mild to moderate LVH with LVEF 60-65%. Grade 1 diastolic   dysfunction with normal estimated LV filling pressure. MAC with   trivial mitral regurgitation. Trivial tricuspid regurgitation   with PASP 30 mmHg.   Assessment/Plan:  75 y.o.Caucasian male, [place] [employer] [place] with medical history significant for PTSD, alcoholism, depression, anxiety, COPD with chronic hypoxic respiratory failure, and hypertension who was admitted 03/02/16-03/07/16 after being found down at his home by a friend. Patient's friend reports that he had not seen or heard from the patient and at least one month and went to his house to check on him. He reportedly found the patient lying on the floor under his hospital bed  with his O2 canister tipped over. Patient's friend initially thought that the patient had expired. He was treated for rhabdomyalosis and discharged to SNF in [place]. [facility] with chest pain. His Troponin was elevated (0.04).  The pt was [facility] after he had recurrent chest pain.    Principal Problem:   Chest pain with moderate risk of acute coronary syndrome Active Problems:   DM type 2 (diabetes mellitus, type 2) (HCC)   HTN (hypertension)   Chronic respiratory failure with hypoxia (HCC)   COPD (chronic obstructive pulmonary disease) (HCC)   Elevated troponin   Chronic renal insufficiency, stage III (moderate)   Depression   PTSD (post-traumatic stress disorder)   Rhabdomyolysis   PLAN: In retrospect the pt's chest p[ain sounds atypical for angina. Not sure any further work up indicated. MD to see.   Smith InternationalLuke Izza Bickle PA-C 03/21/2016, 9:04 AM [phone]

## 2016-03-21 NOTE — Progress Notes (Signed)
PT Cancellation Note  Patient Details Name: [name] MRN: [id number] DOB: 11/25/1940   Cancelled Treatment:    Reason Eval/Treat Not Completed: Patient at procedure or test/unavailable   [name], [name] 03/21/2016, 12:58 PM [name], PT DPT  [phone]

## 2016-03-21 NOTE — Clinical Social Work Note (Signed)
[facility] for [facility].   Roddie McBryant Monai Hindes MSW, SalineLCSW, [place], [phone]

## 2016-03-21 NOTE — H&P (Signed)
History & Physical    Patient ID: [name] MRN: [id number], DOB/AGE: [birth year]   Admit date: 03/20/2016   Primary Physician: Josue Hector, MD Primary Cardiologist: None  Patient Profile    75M with COPD on 3L home oxygen, DM2, HTN, alcoholism, anxiety, depression, HTN, and AF with recent diagnosis of dehydration and rhabdo after being found down, who presents with SOB and chest pain.   Past Medical History    Past Medical History:  Diagnosis Date  . Alcoholism (HCC)   . Anemia   . Anxiety   . Bilateral lower extremity edema 09/03/2012   Possibly secondary to calcium channel blocker.  . Chronic back pain   . Chronic respiratory failure with hypoxia (HCC)   . COPD (chronic obstructive pulmonary disease) (HCC)   . Depression   . DJD (degenerative joint disease)   . Esophageal dysmotility 07/04/2011  . Essential hypertension, benign   . Myocardial infarction (HCC)   . Paroxysmal atrial fibrillation (HCC) 08/29/2012  . Partial small bowel obstruction (HCC) 09/02/2012  . Peripheral neuropathy (HCC)   . Pneumonia   . PTSD (post-traumatic stress disorder)   . Type II or unspecified type diabetes mellitus without mention of complication, not stated as uncontrolled     Past Surgical History:  Procedure Laterality Date  . APPENDECTOMY    . HERNIA REPAIR     X 3  . PROSTATE SURGERY    . Spinal surgeries     X 4     Allergies  Allergies  Allergen Reactions  . Aspirin Other (See Comments)    Stomach upset    History of Present Illness    75M with COPD on 3L home oxygen, DM2, HTN, alcoholism, anxiety, depression, HTN, and AF with recent diagnosis of dehydration and rhabdo after being found down, who presents with SOB and chest pain. He developed CP and was transferred from rehab to [facility] where he was diagnosed with a MI based on a peak troponin of 0.04 (ULN of assay is 0.01). On my interview, the patient denies recalling any of the details about  how he ended up at the hospital. He was treated with IV heparin and metoprolol. He was given plavix but not ASA due to a reported ASA intolerance. 8/16 TTE demomstrated  LVEF 60-65% without WMA. He was also given steroids for a COPD flair. Due to recurrent episodes of CP, he was transferred to [facility].   On arrival, the patient was hemodynamically stable. He reported the chest pain that he was reporting at [facility] was located in the left chest and was worse with movement, inspiration, and palpation.   Home Medications    Prior to Admission medications   Medication Sig Start Date End Date Taking? Authorizing Provider  albuterol (PROVENTIL HFA;VENTOLIN HFA) 108 (90 BASE) MCG/ACT inhaler Inhale 2 puffs into the lungs every 6 (six) hours as needed for wheezing.    Historical Provider, MD  aspirin EC 81 MG EC tablet Take 1 tablet (81 mg total) by mouth daily. 03/07/16   Houston Siren, MD  atorvastatin (LIPITOR) 40 MG tablet Take 40 mg by mouth daily at 6 PM.    Historical Provider, MD  budesonide-formoterol (SYMBICORT) 160-4.5 MCG/ACT inhaler Inhale 2 puffs into the lungs daily.    Historical Provider, MD  ipratropium-albuterol (DUONEB) 0.5-2.5 (3) MG/3ML SOLN Take 3 mLs by nebulization 4 (four) times daily.    Historical Provider, MD  iron polysaccharides (NIFEREX) 150 MG capsule Take 150 mg by mouth  daily.    Historical Provider, MD  metoprolol (LOPRESSOR) 50 MG tablet Take 50 mg by mouth 2 (two) times daily.    Historical Provider, MD  pantoprazole (PROTONIX) 40 MG tablet Take 40 mg by mouth daily.    Historical Provider, MD  PARoxetine (PAXIL) 20 MG tablet Take 1 tablet by mouth daily. 05/23/13   Historical Provider, MD  Tamsulosin HCl (FLOMAX) 0.4 MG CAPS Take 1 capsule (0.4 mg total) by mouth daily after supper. 08/24/12   Rhetta Mura, MD  tiotropium (SPIRIVA) 18 MCG inhalation capsule Place 18 mcg into inhaler and inhale daily.    Historical Provider, MD    Family History    Family History    Problem Relation Age of Onset  . Lung disease Mother   . Prostate cancer Father     Social History    Social History   Social History  . Marital status: Divorced    Spouse name: N/A  . Number of children: N/A  . Years of education: N/A   Occupational History  . Not on file.   Social History Main Topics  . Smoking status: Current Every Day Smoker    Packs/day: 0.50    Years: 55.00    Types: Cigarettes  . Smokeless tobacco: Never Used     Comment: 1 pack a week  . Alcohol use Yes     Comment: 2-3 cases this week per pt.  . Drug use: No  . Sexual activity: Not Currently   Other Topics Concern  . Not on file   Social History Narrative  . No narrative on file     Review of Systems    General:  No chills, fever, night sweats or weight changes.  Cardiovascular: see HPI Dermatological: No rash, lesions/masses Respiratory: chronic cough, dyspnea Urologic: No hematuria, dysuria Abdominal:   No nausea, vomiting, diarrhea, bright red blood per rectum, melena, or hematemesis Neurologic:  No visual changes, wkns, changes in mental status. All other systems reviewed and are otherwise negative except as noted above.  Physical Exam    Blood pressure (!) 136/52, pulse 77, temperature 98.5 F (36.9 C), temperature source Oral, resp. rate 18, height 5\' 4"  (1.626 m), weight 64.7 kg (142 lb 9.6 oz), SpO2 97 %.  General: Pleasant, NAD, thin and chronically ill appearing Psych: Normal affect. Neuro: Alert  Moves all extremities spontaneously.  Neck: Supple without bruits or JVD. Lungs:  Resp regular and unlabored. Coarse basilar breath sounds Heart: RRR no s3, s4, or murmurs. Abdomen: Soft, non-tender, non-distended, BS + x 4.  Extremities: No clubbing, cyanosis or edema. DP/PT/Radials 2+ and equal bilaterally.  Labs    Troponin (Point of Care Test) No results for input(s): TROPIPOC in the last 72 hours. No results for input(s): CKTOTAL, CKMB, TROPONINI in the last 72  hours. Lab Results  Component Value Date   WBC 11.0 (H) 03/04/2016   HGB 10.7 (L) 03/04/2016   HCT 33.1 (L) 03/04/2016   MCV 90.7 03/04/2016   PLT 171 03/04/2016   No results for input(s): NA, K, CL, CO2, BUN, CREATININE, CALCIUM, PROT, BILITOT, ALKPHOS, ALT, AST, GLUCOSE in the last 168 hours.  Invalid input(s): LABALBU Lab Results  Component Value Date   CHOL  03/31/2009    106        ATP III CLASSIFICATION:  <200     mg/dL   Desirable  161-096  mg/dL   Borderline High  >=045    mg/dL   High  HDL 50 03/31/2009   LDLCALC  03/31/2009    37        Total Cholesterol/HDL:CHD Risk Coronary Heart Disease Risk Table                     Men   Women  1/2 Average Risk   3.4   3.3  Average Risk       5.0   4.4  2 X Average Risk   9.6   7.1  3 X Average Risk  23.4   11.0        Use the calculated Patient Ratio above and the CHD Risk Table to determine the patient's CHD Risk.        ATP III CLASSIFICATION (LDL):  <100     mg/dL   Optimal  161-096100-129  mg/dL   Near or Above                    Optimal  130-159  mg/dL   Borderline  045-409160-189  mg/dL   High  >811>190     mg/dL   Very High   TRIG 93 91/47/829508/28/2010   Lab Results  Component Value Date   DDIMER 0.86 (H) 06/15/2012     Radiology Studies    Ct Head Wo Contrast  Result Date: 03/02/2016 CLINICAL DATA:  Patient found under bad at home by a friend. Nonverbal. EXAM: CT HEAD WITHOUT CONTRAST TECHNIQUE: Contiguous axial images were obtained from the base of the skull through the vertex without intravenous contrast. COMPARISON:  02/08/2016 CT. FINDINGS: Brain: No evidence of acute infarction, hemorrhage, extra-axial collection, ventriculomegaly, or mass effect. Moderate atrophy, advanced for age. Hypoattenuation of white matter is suspected, likely chronic microvascular ischemic change. Vascular: Hyperdense RIGHT vertebral felt to represent atheromatous change rather than thrombosis. Moderate vascular calcification in the carotid  siphon regions. Skull: Negative for fracture or focal lesion. Sinuses/Orbits: No acute findings. Other: Calcific pannus surrounds the odontoid. 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 Retracted LEFT TM. 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 If further investigation desired, consider elective ENT consultation. Compared with priors, similar appearance. IMPRESSION: No acute intracranial findings.  Atrophy and small vessel disease. No change from priors. Electronically Signed   By: Elsie StainJohn T Curnes M.D.   On: 03/02/2016 16:59  [place] Renal  Result Date: 03/03/2016 CLINICAL DATA:  Acute renal failure and sepsis. EXAM: RENAL / URINARY TRACT ULTRASOUND COMPLETE COMPARISON:  CT 01/06/2016 FINDINGS: Right Kidney: Length: 8.5 cm. Echogenicity within normal limits. No mass or hydronephrosis visualized. Left Kidney: Length: 9.2 cm. Echogenicity within normal limits. No mass or hydronephrosis visualized. Bladder: Not imaged. IMPRESSION: No acute urinary pathology.  No evidence of hydronephrosis. Electronically Signed   By: Jolaine ClickArthur  Hoss M.D.   On: 03/03/2016 11:40  Dg Chest Port 1 View  Result Date: 03/02/2016 CLINICAL DATA:  Sepsis. EXAM: PORTABLE CHEST 1 VIEW COMPARISON:  Portable chest radiograph earlier this day at 1521 hour FINDINGS: Lower lung volumes from prior. Heart size and mediastinal contours are unchanged. Mild elevation of right hemidiaphragm likely related to lower lung volumes. Upper lobe emphysema. Bibasilar opacities likely scarring. No confluent airspace disease to suggest pneumonia. No evidence of pleural effusion. No pneumothorax. IMPRESSION: Lower lung volumes from prior exam. Upper lobe emphysema. Stable bibasilar scarring. Electronically Signed   By: Rubye OaksMelanie  Ehinger M.D.   On: 03/02/2016 19:45  Dg Chest Portable 1 View  Result Date: 03/02/2016 CLINICAL DATA:  Rhonchi.   Abnormal breath sounds. EXAM: PORTABLE CHEST 1 VIEW COMPARISON:  02/10/2016 FINDINGS: Heart is normal size. Continued increased markings at the lung bases, likely scarring, stable. No confluent opacities or effusions. No acute bony abnormality. IMPRESSION: Bibasilar scarring, stable.  No active disease. Electronically Signed   By: Charlett NoseKevin  Dover M.D.   On: 03/02/2016 15:33   ECG & Cardiac Imaging    03/03/16 TTE - Left ventricle: The cavity size was normal. Wall thickness was   increased increased in a pattern of mild to moderate LVH.   Systolic function was normal. The estimated ejection fraction was   in the range of 60% to 65%. Wall motion was normal; there were no   regional wall motion abnormalities. Doppler parameters are   consistent with abnormal left ventricular relaxation (grade 1   diastolic dysfunction). - Aortic valve: Mildly calcified annulus. Trileaflet. - Mitral valve: Calcified annulus. There was trivial regurgitation. - Right atrium: Central venous pressure (est): 3 mm Hg. - Tricuspid valve: There was trivial regurgitation. - Pulmonary arteries: PA peak pressure: 30 mm Hg (S). - Pericardium, extracardiac: A prominent pericardial fat pad was   present.  Impressions:  - Mild to moderate LVH with LVEF 60-65%. Grade 1 diastolic   dysfunction with normal estimated LV filling pressure. MAC with   trivial mitral regurgitation. Trivial tricuspid regurgitation   with PASP 30 mmHg.  Assessment & Plan    21M with COPD on 3L home oxygen, DM2, HTN, alcoholism, anxiety, depression, HTN, and AF with recent diagnosis of dehydration and rhabdo after being found down, who presents with SOB and chest pain.   [facility] trop[facility].  The CP that the patient is reporting to me (and states is the [facility] he had [facility]) is most likely MSK in nature. The borderline Tn values observed at [facility] may be related to the recent episode of rhabdo  (discharge 8/4 for  that hospitalization).   -Stop heparin -Cycle troponins -Stop plavix -Restart ASA (he does not have a true allergy and has been on it recently) -PRN tylenol for pain -CXR -dysphagia I diet with honey thicken liquid (based on last admission) -Speech and swallow evaluation -PT consult  Signed, Glori LuisFRIEDMAN, Ossie Beltran, MD 03/21/2016, 12:05 AM

## 2016-03-21 NOTE — Clinical Social Work Note (Signed)
Clinical Social Work Assessment  Patient Details  Name: [name] MRN: [id number] Date of Birth: [birth year]  Date of referral:  03/21/16               Reason for consult:  Discharge Planning, Facility Placement                Permission sought to share information with:  Facility Sport and exercise psychologist, Family Supports Permission granted to share information::  Yes, Verbal Permission Granted  Name::     Jori Moll  Agency::  [employer]  Relationship::  Son  Contact Information:     Housing/Transportation Living arrangements for the past 2 months:  [place] of Information:  Patient, Adult Children Patient Interpreter Needed:  None Criminal Activity/Legal Involvement Pertinent to Current Situation/Hospitalization:  No - Comment as needed Significant Relationships:  Adult Children Lives with:  Self Do you feel safe going back to the place where you live?  Yes Need for family participation in patient care:  Yes (Comment)  Care giving concerns:  The patient and son agree with recommendation for return to SNF at time of discharge.    Social Worker assessment / plan:  CSW met with patient at bedside to complete assessment. The patient was resting comfortably in bed. The patient shares that he was admitted from [facility]. The patient was experiencing chest pain and was taken to [facility] for evaluation and then transferred to [facility]. The patient requests that CSW speak with patient's son Jori Moll to discuss discharge planning. CSW spoke with Jori Moll by phone to introduce self and explain role of CSW. Jori Moll states that they are happy with the care that the patient receives at [facility] and does plan for the patient to return at discharge. CSW will contact [facility] and assist with DC when appropriate.   Employment status:  Disabled (Comment on whether or not currently receiving Disability), Retired Office manager PT Recommendations:  [place] / Referral to community resources:  [place]  Patient/Family's Response to care:  The patient and patient's son appear to be happy with the care the patient is receiving both at [facility] and [facility]. Son is waiting for the results of the patient's cath.  Patient/Family's Understanding of and Emotional Response to Diagnosis, Current Treatment, and Prognosis:  The patient is somewhat disoriented but the son appears to have a good understanding of the patient's diagnosis and reason for admission. The patient looks forward to returning to [facility] when able and appears to be coping well with this hospitalization.   Emotional Assessment Appearance:  Appears stated age Attitude/Demeanor/Rapport:  Other (Patient was sleepy, but welcoming of CSW. Patient is somewhat disoriented at this time. ) Affect (typically observed):  Accepting Orientation:  Oriented to  Time, Oriented to Self Alcohol / Substance use:  Alcohol Use Psych involvement (Current and /or in the community):  No (Comment)  Discharge Needs  Concerns to be addressed:  Care Coordination, Discharge Planning Concerns Readmission within the last 30 days:  Yes Current discharge risk:  Chronically ill, Cognitively Impaired, Physical Impairment, Lives alone Barriers to Discharge:  Continued Medical Work up   [place]es, LCSW 03/21/2016, 10:31 AM

## 2016-03-21 NOTE — Progress Notes (Signed)
MBSS complete. Full report located under chart review in imaging section.  Dariusz Brase Paiewonsky, M.A. CCC-SLP [phone]  

## 2016-03-21 NOTE — Evaluation (Signed)
Clinical/Bedside Swallow Evaluation Patient Details  Name: [name] MRN: [id number] Date of Birth: 07/09/1941  Today's Date: 03/21/2016 Time: SLP Start Time (ACUTE ONLY): 1041 SLP Stop Time (ACUTE ONLY): 1057 SLP Time Calculation (min) (ACUTE ONLY): 16 min  Past Medical History:  Past Medical History:  Diagnosis Date  . Alcoholism (HCC)   . Anemia   . Anxiety   . Bilateral lower extremity edema 09/03/2012   Possibly secondary to calcium channel blocker.  . Chronic back pain   . Chronic respiratory failure with hypoxia (HCC)   . COPD (chronic obstructive pulmonary disease) (HCC)   . Depression   . DJD (degenerative joint disease)   . Esophageal dysmotility 07/04/2011  . Essential hypertension, benign   . Myocardial infarction (HCC)   . Paroxysmal atrial fibrillation (HCC) 08/29/2012  . Partial small bowel obstruction (HCC) 09/02/2012  . Peripheral neuropathy (HCC)   . Pneumonia   . PTSD (post-traumatic stress disorder)   . Type II or unspecified type diabetes mellitus without mention of complication, not stated as uncontrolled    Past Surgical History:  Past Surgical History:  Procedure Laterality Date  . APPENDECTOMY    . HERNIA REPAIR     X 3  . PROSTATE SURGERY    . Spinal surgeries     X 4   HPI:  75 y.o.Caucasian male, [place] [place] with medical history significant for PTSD, alcoholism, depression, anxiety, COPD with chronic hypoxic respiratory failure, and hypertension who was admitted 03/02/16-8/4/17after being found down at his home by a friend. During this admission he was recommended to have Dys 1 diet and honey thick liquids. He says that he frequently gets PNA - often multiple times per year.   Assessment / Plan / Recommendation Clinical Impression  Pt has immediate inhalation post-swallow with thin liquids, followed by delayed coughing. Initially he appears to tolerate nectar-thick liquids and purees, but after administration of pills in  applesauce he starts to exhibit delayed coughing will all consistencies tested. This is concerning for possible esophageal component, potentially with seocndary pharyngeal deficits as well. Pt describes a h/o trouble with this "esophagus" but cannot elaborate further. Most recent esophagram from 2012 revealed minimal age-related esophageal dysmotility. Recommend to proceed with MBS to better assess oropharyngeal function. Will plan to complete as able once stress test is completed. Will continue current diet until then, holding POs if increased coughing occurs.    Aspiration Risk  Moderate aspiration risk    Diet Recommendation Dysphagia 1 (Puree);Honey-thick liquid   Liquid Administration via: Cup Medication Administration: Crushed with puree Supervision: Patient able to self feed;Full supervision/cueing for compensatory strategies Compensations: Minimize environmental distractions;Slow rate;Small sips/bites;Follow solids with liquid Postural Changes: Seated upright at 90 degrees;Remain upright for at least 30 minutes after po intake    Other  Recommendations Oral Care Recommendations: Oral care BID   Follow up Recommendations       Frequency and Duration            Prognosis Prognosis for Safe Diet Advancement: Fair      Swallow Study   General HPI: 75 y.o.Caucasian male, [employer] [place] with medical history significant for PTSD, alcoholism, depression, anxiety, COPD with chronic hypoxic respiratory failure, and hypertension who was admitted 03/02/16-8/4/17after being found down at his home by a friend. During this admission he was recommended to have Dys 1 diet and honey thick liquids. He says that he frequently gets PNA - often multiple times per year. Type  of Study: Bedside Swallow Evaluation Previous Swallow Assessment: BSE earlier this month recommending NPO, later advanced that admission to Dys 1 diet and honey thick liquids Diet Prior to this Study: Dysphagia 1  (puree);Honey-thick liquids Temperature Spikes Noted: No Respiratory Status: Nasal cannula History of Recent Intubation: No Behavior/Cognition: Alert;Cooperative Oral Cavity Assessment: Within Functional Limits Oral Care Completed by SLP: No Oral Cavity - Dentition: Edentulous Patient Positioning: Upright in bed Baseline Vocal Quality: Low vocal intensity Volitional Cough: Strong;Congested Volitional Swallow: Able to elicit    Oral/Motor/Sensory Function Overall Oral Motor/Sensory Function: Generalized oral weakness   Ice Chips Ice chips: Not tested   Thin Liquid Thin Liquid: Impaired Presentation: Cup;Self Fed;Straw Pharyngeal  Phase Impairments: Suspected delayed Swallow;Cough - Delayed    Nectar Thick Nectar Thick Liquid: Impaired Presentation: Cup;Self Fed;Straw Pharyngeal Phase Impairments: Cough - Delayed   Honey Thick Honey Thick Liquid: Not tested   Puree Puree: Impaired Presentation: Spoon Pharyngeal Phase Impairments: Cough - Delayed   Solid   GO   Solid: Not tested        [name], [name] 03/21/2016,12:47 PM  [name], M.A. CCC-SLP [phone]

## 2016-03-22 ENCOUNTER — Encounter (HOSPITAL_COMMUNITY): Payer: Self-pay | Admitting: *Deleted

## 2016-03-22 DIAGNOSIS — N189 Chronic kidney disease, unspecified: Secondary | ICD-10-CM

## 2016-03-22 DIAGNOSIS — R079 Chest pain, unspecified: Secondary | ICD-10-CM | POA: Diagnosis not present

## 2016-03-22 DIAGNOSIS — R7989 Other specified abnormal findings of blood chemistry: Secondary | ICD-10-CM | POA: Diagnosis not present

## 2016-03-22 DIAGNOSIS — J449 Chronic obstructive pulmonary disease, unspecified: Secondary | ICD-10-CM | POA: Diagnosis not present

## 2016-03-22 DIAGNOSIS — I10 Essential (primary) hypertension: Secondary | ICD-10-CM | POA: Diagnosis not present

## 2016-03-22 LAB — IRON AND TIBC
Iron: 31 ug/dL — ABNORMAL LOW (ref 45–182)
SATURATION RATIOS: 13 % — AB (ref 17.9–39.5)
TIBC: 239 ug/dL — AB (ref 250–450)
UIBC: 208 ug/dL

## 2016-03-22 LAB — GLUCOSE, CAPILLARY
GLUCOSE-CAPILLARY: 192 mg/dL — AB (ref 65–99)
Glucose-Capillary: 90 mg/dL (ref 65–99)
Glucose-Capillary: 123 mg/dL — ABNORMAL HIGH (ref 65–99)
Glucose-Capillary: 107 mg/dL — ABNORMAL HIGH (ref 65–99)

## 2016-03-22 LAB — PROTIME-INR
INR: 1.04
PROTHROMBIN TIME: 13.6 s (ref 11.4–15.2)

## 2016-03-22 LAB — VITAMIN B12: VITAMIN B 12: 415 pg/mL (ref 180–914)

## 2016-03-22 MED ORDER — SODIUM CHLORIDE 0.9% FLUSH
3.0000 mL | Freq: Two times a day (BID) | INTRAVENOUS | Status: DC
Start: 2016-03-22 — End: 2016-03-25
  Administered 2016-03-22 – 2016-03-24 (×5): 3 mL via INTRAVENOUS

## 2016-03-22 MED ORDER — SODIUM CHLORIDE 0.9% FLUSH: 3.0000 mL | INTRAVENOUS | Status: DC | PRN

## 2016-03-22 MED ORDER — SODIUM CHLORIDE 0.9 % WEIGHT BASED INFUSION
1.0000 mL/kg/h | INTRAVENOUS | Status: DC
  Administered 2016-03-24: 1 mL/kg/h via INTRAVENOUS

## 2016-03-22 MED ORDER — SODIUM CHLORIDE 0.9 % WEIGHT BASED INFUSION
3.0000 mL/kg/h | INTRAVENOUS | Status: AC
  Administered 2016-03-24: 3 mL/kg/h via INTRAVENOUS

## 2016-03-22 MED ORDER — ASPIRIN 81 MG PO CHEW
81.0000 mg | CHEWABLE_TABLET | ORAL | Status: AC
  Administered 2016-03-24: 81 mg via ORAL
  Filled 2016-03-22: qty 1

## 2016-03-22 MED ORDER — SODIUM CHLORIDE 0.9 % IV SOLN: 250.0000 mL | INTRAVENOUS | Status: DC | PRN

## 2016-03-22 NOTE — NC FL2 (Signed)
[place] MEDICAID [id number] LEVEL OF CARE SCREENING TOOL     IDENTIFICATION  Patient Name: [name] Birthdate: 01/27/1941 Sex: male Admission Date (Current Location): 03/20/2016  [place]  [place] [id number] [facility] Facility and Address:  [facility], [address], [place], [place] [address]      Provider Number: [id number]  Attending Physician Name and Address:  Lennette Biharihomas A Kelly, MD  Relative Name and Phone Number:       Current Level of Care: Hospital Recommended Level of Care: Skilled Nursing Facility Prior Approval Number:    Date Approved/Denied:   PASRR Number: [id number]  Discharge Plan: SNF    Current Diagnoses: Patient Active Problem List   Diagnosis Date Noted  . Chronic renal insufficiency, stage III (moderate) 03/21/2016  . Altered mental status 03/02/2016  . Pressure ulcer 03/02/2016  . AKI (acute kidney injury) (HCC) 03/02/2016  . Hypernatremia 03/02/2016  . Elevated troponin 03/02/2016  . Dehydration 03/02/2016  . Rhabdomyolysis 03/02/2016  . Acute encephalopathy 03/02/2016  . Traumatic rhabdomyolysis (HCC)   . COPD (chronic obstructive pulmonary disease) (HCC) 10/08/2013  . COPD exacerbation (HCC) 10/08/2013  . Guaiac positive stools 08/18/2013  . Chest pain with moderate risk of acute coronary syndrome 01/31/2013  . PTSD (post-traumatic stress disorder) 01/31/2013  . Leukocytosis 01/31/2013  . Chest pain at rest 01/24/2013  . Paroxysmal atrial fibrillation (HCC) 09/04/2012  . Bilateral lower extremity edema 09/03/2012  . Thrombocytopenia (HCC) 09/03/2012  . Nausea & vomiting 09/02/2012  . Partial small bowel obstruction (HCC) 09/02/2012  . Cor pulmonale (HCC) 08/29/2012  . Acute-on-chronic respiratory failure (HCC) 08/29/2012  . Physical deconditioning 08/18/2012  . Chronic respiratory failure with hypoxia (HCC) 08/17/2012  . Esophageal dysmotility 07/04/2011  . Anemia 07/02/2011  . Hyponatremia  07/02/2011  . Depression 07/01/2011  . Anxiety 07/01/2011  . Smoker 07/01/2011  . COPD GOLD II with reversibility 06/30/2011  . DM type 2 (diabetes mellitus, type 2) (HCC) 06/30/2011  . HTN (hypertension) 06/30/2011  . DDD (degenerative disc disease) 06/30/2011  . BPH (benign prostatic hyperplasia) 06/30/2011  . Dysphagia 06/30/2011    Orientation RESPIRATION BLADDER Height & Weight     Self, Time, Place  O2 (3L) Incontinent Weight: 64.4 kg (142 lb) Height:  5\' 4"  (162.6 cm)  BEHAVIORAL SYMPTOMS/MOOD NEUROLOGICAL BOWEL NUTRITION STATUS   (NONE)  (NONE) Continent Diet (NPO at this time)  AMBULATORY STATUS COMMUNICATION OF NEEDS Skin   Extensive Assist Verbally Other (Comment) (PU Stage 1 mid sacrum) PU Stage 1 Dressing: Daily                     Personal Care Assistance Level of Assistance  Bathing, Feeding, Dressing Bathing Assistance: Limited assistance Feeding assistance: Limited assistance Dressing Assistance: Limited assistance     Functional Limitations Info  Speech, Sight, Hearing Sight Info: Adequate Hearing Info: Adequate Speech Info: Adequate    SPECIAL CARE FACTORS FREQUENCY  PT (By licensed PT), OT (By licensed OT), Speech therapy     PT Frequency: 5/week OT Frequency: 5/week     Speech Therapy Frequency: 5/week      Contractures Contractures Info: Not present    Additional Factors Info  Code Status, Allergies, Psychotropic Code Status Info: Full Code Allergies Info: Aspirin Psychotropic Info: Paxil         Current Medications (03/22/2016):  This is the current hospital active medication list Current Facility-Administered Medications  Medication Dose Route Frequency Provider Last Rate Last Dose  .  albuterol (PROVENTIL) (2.5 MG/3ML) 0.083% nebulizer solution 3 mL  3 mL Inhalation Q6H PRN Glori Luisaniel Friedman, MD      . aspirin EC tablet 81 mg  81 mg Oral Daily Glori Luisaniel Friedman, MD   81 mg at 03/22/16 0908  . atorvastatin (LIPITOR) tablet 40 mg   40 mg Oral q1800 Glori Luisaniel Friedman, MD   40 mg at 03/21/16 1728  . enoxaparin (LOVENOX) injection 40 mg  40 mg Subcutaneous Q24H Glori Luisaniel Friedman, MD   40 mg at 03/22/16 0908  . ipratropium-albuterol (DUONEB) 0.5-2.5 (3) MG/3ML nebulizer solution 3 mL  3 mL Nebulization QID Glori Luisaniel Friedman, MD   3 mL at 03/22/16 0836  . iron polysaccharides (NIFEREX) capsule 150 mg  150 mg Oral Daily Glori Luisaniel Friedman, MD   150 mg at 03/22/16 0908  . metoprolol (LOPRESSOR) tablet 50 mg  50 mg Oral BID Glori Luisaniel Friedman, MD   50 mg at 03/22/16 0908  . mometasone-formoterol (DULERA) 200-5 MCG/ACT inhaler 2 puff  2 puff Inhalation BID Glori Luisaniel Friedman, MD   2 puff at 03/22/16 0836  . pantoprazole (PROTONIX) EC tablet 40 mg  40 mg Oral Daily Glori Luisaniel Friedman, MD   40 mg at 03/22/16 0908  . PARoxetine (PAXIL) tablet 20 mg  20 mg Oral Daily Glori Luisaniel Friedman, MD   20 mg at 03/22/16 0908  . tamsulosin (FLOMAX) capsule 0.4 mg  0.4 mg Oral QPC supper Glori Luisaniel Friedman, MD   0.4 mg at 03/21/16 1728  . tiotropium (SPIRIVA) inhalation capsule 18 mcg  18 mcg Inhalation Daily Glori Luisaniel Friedman, MD   18 mcg at 03/22/16 [id number]     Discharge Medications: Please see discharge summary for a list of discharge medications.  Relevant Imaging Results:  Relevant Lab Results:   Additional Information SSN: [id number]  Venita Lickampbell, Regie Bunner B, LCSW

## 2016-03-22 NOTE — Progress Notes (Addendum)
SUBJECTIVE:  No complaints  OBJECTIVE:   Vitals:   Vitals:   03/21/16 2136 03/22/16 0500 03/22/16 0837 03/22/16 1236  BP:  (!) 145/41    Pulse:  70    Resp:  18    Temp:  97.8 F (36.6 C)    TempSrc:  Oral    SpO2: 98% 97% 98% 97%  Weight:      Height:       I&O's:   Intake/Output Summary (Last 24 hours) at 03/22/16 1255 Last data filed at 03/22/16 1100  Gross per 24 hour  Intake              360 ml  Output              376 ml  Net              -16 ml   TELEMETRY: Reviewed telemetry pt in NSR:     PHYSICAL EXAM General: Well developed, well nourished, in no acute distress Head: Eyes PERRLA, No xanthomas.   Normal cephalic and atramatic  Lungs:   Clear bilaterally to auscultation and percussion. Heart:   HRRR S1 S2 Pulses are 2+ & equal. Abdomen: Bowel sounds are positive, abdomen soft and non-tender without masses Msk:  Back normal, normal gait. Normal strength and tone for age. Extremities:   No clubbing, cyanosis or edema.  DP +1 Neuro: Alert and oriented X 3. Psych:  Good affect, responds appropriately   LABS: Basic Metabolic Panel:  Recent Labs  16/10/96 0050  NA 141  K 4.2  CL 105  CO2 28  GLUCOSE 148*  BUN 33*  CREATININE 1.54*  CALCIUM 8.6*  MG 1.7   Liver Function Tests:  Recent Labs  03/21/16 0050  AST 29  ALT 42  ALKPHOS 79  BILITOT 0.3  PROT 5.7*  ALBUMIN 2.9*   No results for input(s): LIPASE, AMYLASE in the last 72 hours. CBC:  Recent Labs  03/21/16 0050  WBC 9.1  HGB 8.3*  HCT 25.9*  MCV 88.7  PLT 181   Cardiac Enzymes:  Recent Labs  03/21/16 0050 03/21/16 0656 03/21/16 1453  TROPONINI <0.03 <0.03 <0.03   BNP: Invalid input(s): POCBNP D-Dimer: No results for input(s): DDIMER in the last 72 hours. Hemoglobin A1C: No results for input(s): HGBA1C in the last 72 hours. Fasting Lipid Panel: No results for input(s): CHOL, HDL, LDLCALC, TRIG, CHOLHDL, LDLDIRECT in the last 72 hours. Thyroid Function  Tests:  Recent Labs  03/21/16 0050  TSH 1.736   Anemia Panel: No results for input(s): VITAMINB12, FOLATE, FERRITIN, TIBC, IRON, RETICCTPCT in the last 72 hours. Coag Panel:   Lab Results  Component Value Date   INR 1.13 03/02/2016   INR 1.05 01/24/2013   INR 0.9 03/31/2009    RADIOLOGY: X-ray Chest Pa And Lateral  Result Date: 03/21/2016 CLINICAL DATA:  Chest pain.  Shortness of breath . EXAM: CHEST  2 VIEW COMPARISON:  03/20/2016. FINDINGS: Mediastinum hilar structures normal. Heart size stable. Low lung volumes with mild bibasilar atelectasis. No pleural effusion or pneumothorax. IMPRESSION: Low lung volumes with mild bibasilar and/or infiltrates again noted. Bibasilar pneumonia cannot be excluded. Similar findings noted on prior exam . Electronically Signed   By: Maisie Fus  Register   On: 03/21/2016 07:00   Ct Head Wo Contrast  Result Date: 03/02/2016 CLINICAL DATA:  Patient found under bad at home by a friend. Nonverbal. EXAM: CT HEAD WITHOUT CONTRAST TECHNIQUE: Contiguous axial images were obtained from the  base of the skull through the vertex without intravenous contrast. COMPARISON:  02/08/2016 CT. FINDINGS: Brain: No evidence of acute infarction, hemorrhage, extra-axial collection, ventriculomegaly, or mass effect. Moderate atrophy, advanced for age. Hypoattenuation of white matter is suspected, likely chronic microvascular ischemic change. Vascular: Hyperdense RIGHT vertebral felt to represent atheromatous change rather than thrombosis. Moderate vascular calcification in the carotid siphon regions. Skull: Negative for fracture or focal lesion. Sinuses/Orbits: No acute findings. Other: Calcific pannus surrounds the odontoid. 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 Retracted LEFT TM. 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 If further investigation desired, consider  elective ENT consultation. Compared with priors, similar appearance. IMPRESSION: No acute intracranial findings.  Atrophy and small vessel disease. No change from priors. Electronically Signed   By: Elsie StainJohn T Curnes M.D.   On: 03/02/2016 16:59  [place] Renal  Result Date: 03/03/2016 CLINICAL DATA:  Acute renal failure and sepsis. EXAM: RENAL / URINARY TRACT ULTRASOUND COMPLETE COMPARISON:  CT 01/06/2016 FINDINGS: Right Kidney: Length: 8.5 cm. Echogenicity within normal limits. No mass or hydronephrosis visualized. Left Kidney: Length: 9.2 cm. Echogenicity within normal limits. No mass or hydronephrosis visualized. Bladder: Not imaged. IMPRESSION: No acute urinary pathology.  No evidence of hydronephrosis. Electronically Signed   By: Jolaine ClickArthur  Hoss M.D.   On: 03/03/2016 11:40  Nm Myocar Multi W/spect W/wall Motion / Ef  Result Date: 03/21/2016 CLINICAL DATA:  75 year old with chest pain. EXAM: MYOCARDIAL IMAGING WITH SPECT (REST AND PHARMACOLOGIC-STRESS) GATED LEFT VENTRICULAR WALL MOTION STUDY LEFT VENTRICULAR EJECTION FRACTION TECHNIQUE: Standard myocardial SPECT imaging was performed after resting intravenous injection of 10 mCi Tc-5384m tetrofosmin. Subsequently, intravenous infusion of Lexiscan was performed under the supervision of the Cardiology staff. At peak effect of the drug, 30 mCi Tc-3484m tetrofosmin was injected intravenously and standard myocardial SPECT imaging was performed. Quantitative gated imaging was also performed to evaluate left ventricular wall motion, and estimate left ventricular ejection fraction. COMPARISON:  None. FINDINGS: Perfusion: There is mild reversibility along the apical segment of the inferior wall. No large infarct. No other areas are suspicious for reversibility. Wall Motion: Normal left ventricular wall motion. No left ventricular dilation. Left Ventricular Ejection Fraction: 65 % End diastolic volume 82 ml End systolic volume 29 ml IMPRESSION: 1. Mild reversibility involving the  apical segment of the inferior wall. Findings concerning for a small area of ischemia. 2. Normal left ventricular wall motion. 3. Left ventricular ejection fraction is 65%. 4. Non invasive risk stratification*: Intermediate *2012 Appropriate Use Criteria for Coronary Revascularization Focused Update: J Am Coll Cardiol. 2012;59(9):857-881. http://content.dementiazones.comonlinejacc.org/article.aspx?articleid=1201161 Electronically Signed   By: Richarda OverlieAdam  Henn M.D.   On: 03/21/2016 15:02   Dg Chest Port 1 View  Result Date: 03/02/2016 CLINICAL DATA:  Sepsis. EXAM: PORTABLE CHEST 1 VIEW COMPARISON:  Portable chest radiograph earlier this day at 1521 hour FINDINGS: Lower lung volumes from prior. Heart size and mediastinal contours are unchanged. Mild elevation of right hemidiaphragm likely related to lower lung volumes. Upper lobe emphysema. Bibasilar opacities likely scarring. No confluent airspace disease to suggest pneumonia. No evidence of pleural effusion. No pneumothorax. IMPRESSION: Lower lung volumes from prior exam. Upper lobe emphysema. Stable bibasilar scarring. Electronically Signed   By: Rubye OaksMelanie  Ehinger M.D.   On: 03/02/2016 19:45  Dg Chest Portable 1 View  Result Date: 03/02/2016 CLINICAL DATA:  Rhonchi.  Abnormal breath sounds. EXAM: PORTABLE CHEST 1 VIEW COMPARISON:  02/10/2016 FINDINGS: Heart is normal size. Continued  increased markings at the lung bases, likely scarring, stable. No confluent opacities or effusions. No acute bony abnormality. IMPRESSION: Bibasilar scarring, stable.  No active disease. Electronically Signed   By: Charlett Nose M.D.   On: 03/02/2016 15:33  Dg Swallowing Func-speech Pathology  Result Date: 03/21/2016 Objective Swallowing Evaluation: Type of Study: MBS-Modified Barium Swallow Study Patient Details Name: [name] MRN: [id number] Date of Birth: 04-19-1941 Today's Date: 03/21/2016 Time: SLP Start Time (ACUTE ONLY): 1419-SLP Stop Time (ACUTE ONLY): 1431 SLP Time Calculation (min)  (ACUTE ONLY): 12 min Past Medical History: Past Medical History: Diagnosis Date . Alcoholism (HCC)  . Anemia  . Anxiety  . Bilateral lower extremity edema 09/03/2012  Possibly secondary to calcium channel blocker. . Chronic back pain  . Chronic respiratory failure with hypoxia (HCC)  . COPD (chronic obstructive pulmonary disease) (HCC)  . Depression  . DJD (degenerative joint disease)  . Esophageal dysmotility 07/04/2011 . Essential hypertension, benign  . Myocardial infarction (HCC)  . Paroxysmal atrial fibrillation (HCC) 08/29/2012 . Partial small bowel obstruction (HCC) 09/02/2012 . Peripheral neuropathy (HCC)  . Pneumonia  . PTSD (post-traumatic stress disorder)  . Type II or unspecified type diabetes mellitus without mention of complication, not stated as uncontrolled  Past Surgical History: Past Surgical History: Procedure Laterality Date . APPENDECTOMY   . HERNIA REPAIR    X 3 . PROSTATE SURGERY   . Spinal surgeries    X 4 HPI: 75 y.o.Caucasian male, [place] [employer] veteran with medical history significant for PTSD, alcoholism, depression, anxiety, COPD with chronic hypoxic respiratory failure, and hypertension who was admitted 03/02/16-8/4/17after being found down at his home by a friend. During this admission he was recommended to have Dys 1 diet and honey thick liquids. He says that he frequently gets PNA - often multiple times per year. Subjective: pt says he has trouble with his esophagus Assessment / Plan / Recommendation CHL IP CLINICAL IMPRESSIONS 03/21/2016 Therapy Diagnosis WFL;Suspected primary esophageal dysphagia Clinical Impression Pt's oropharyngeal swallow is within gross functional limits given age and lack of dentition. Frequent coughing noted during study was not associated with any airway compromise. He appears to be more at risk for post-prandial aspiration given what appeared to be reduced clearance of the esophagus, although MD was not present to confirm. Recommend Dys 2 diet given lack  of dentition, consistency with home diet, and esophageal concerns. He may have thin liquids as tolerated. SLP to f/u for tolerance and reinforcement of aspiration and esophageal precautions. Impact on safety and function Moderate aspiration risk   CHL IP TREATMENT RECOMMENDATION 03/21/2016 Treatment Recommendations Therapy as outlined in treatment plan below   Prognosis 03/21/2016 Prognosis for Safe Diet Advancement Fair Barriers to Reach Goals Other (Comment) Barriers/Prognosis Comment -- CHL IP DIET RECOMMENDATION 03/21/2016 SLP Diet Recommendations Dysphagia 2 (Fine chop) solids;Thin liquid Liquid Administration via Cup;Straw Medication Administration Whole meds with puree Compensations Minimize environmental distractions;Slow rate;Small sips/bites;Follow solids with liquid Postural Changes Remain semi-upright after after feeds/meals (Comment);Seated upright at 90 degrees   CHL IP OTHER RECOMMENDATIONS 03/21/2016 Recommended Consults Consider esophageal assessment Oral Care Recommendations Oral care BID Other Recommendations --   CHL IP FOLLOW UP RECOMMENDATIONS 03/21/2016 Follow up Recommendations (No Data)   CHL IP FREQUENCY AND DURATION 03/21/2016 Speech Therapy Frequency (ACUTE ONLY) min 2x/week Treatment Duration 2 weeks      CHL IP ORAL PHASE 03/21/2016 Oral Phase WFL Oral - Pudding Teaspoon -- Oral - Pudding Cup -- Oral - Honey Teaspoon -- Oral -  Honey Cup -- Oral - Nectar Teaspoon -- Oral - Nectar Cup -- Oral - Nectar Straw -- Oral - Thin Teaspoon -- Oral - Thin Cup -- Oral - Thin Straw -- Oral - Puree -- Oral - Mech Soft -- Oral - Regular -- Oral - Multi-Consistency -- Oral - Pill -- Oral Phase - Comment --  CHL IP PHARYNGEAL PHASE 03/21/2016 Pharyngeal Phase WFL Pharyngeal- Pudding Teaspoon -- Pharyngeal -- Pharyngeal- Pudding Cup -- Pharyngeal -- Pharyngeal- Honey Teaspoon -- Pharyngeal -- Pharyngeal- Honey Cup -- Pharyngeal -- Pharyngeal- Nectar Teaspoon -- Pharyngeal -- Pharyngeal- Nectar Cup -- Pharyngeal  -- Pharyngeal- Nectar Straw -- Pharyngeal -- Pharyngeal- Thin Teaspoon -- Pharyngeal -- Pharyngeal- Thin Cup -- Pharyngeal -- Pharyngeal- Thin Straw -- Pharyngeal -- Pharyngeal- Puree -- Pharyngeal -- Pharyngeal- Mechanical Soft -- Pharyngeal -- Pharyngeal- Regular -- Pharyngeal -- Pharyngeal- Multi-consistency -- Pharyngeal -- Pharyngeal- Pill -- Pharyngeal -- Pharyngeal Comment --  CHL IP CERVICAL ESOPHAGEAL PHASE 03/21/2016 Cervical Esophageal Phase WFL Pudding Teaspoon -- Pudding Cup -- Honey Teaspoon -- Honey Cup -- Nectar Teaspoon -- Nectar Cup -- Nectar Straw -- Thin Teaspoon -- Thin Cup -- Thin Straw -- Puree -- Mechanical Soft -- Regular -- Multi-consistency -- Pill -- Cervical Esophageal Comment -- No flowsheet data found. Maxcine Ham 03/21/2016, 2:53 PM  Maxcine Ham, M.A. CCC-SLP [phone]             Assessment/Plan:  75 y.o.Caucasian male, [place] [employer] veteran with medical history significant for PTSD, alcoholism, depression, anxiety, COPD with chronic hypoxic respiratory failure, and hypertension who was admitted 03/02/16-03/07/16 after being found down at his home by a friend. Patient's friend reports that he had not seen or heard from the patient and at least one month and went to his house to check on him. He reportedly found the patient lying on the floor under his hospital bed with his O2 canister tipped over. Patient's friend initially thought that the patient had expired. He was treated for rhabdomyalosis and discharged to SNF in [place]. He was sent from the SNF to [facility] 03/19/16 with chest pain. His Troponin was elevated (0.04).  The pt was transferred to [facility] for further evaluation after he had recurrent chest pain.    Principal Problem:   Chest pain with moderate risk of acute coronary syndrome Active Problems:   DM type 2 (diabetes mellitus, type 2) (HCC)   HTN (hypertension)   Chronic respiratory failure with hypoxia (HCC)   COPD (chronic obstructive pulmonary  disease) (HCC)   Elevated troponin   Chronic renal insufficiency, stage III (moderate)   Depression   PTSD (post-traumatic stress disorder)   Rhabdomyolysis  1.  Chest pain but very poor historian.   He has SOB at baseline from COPD and is on home O2 at 3L but this get much worse when he has chest pain.  His trop is negative here and suspect that the slight elevation at outside hospital was due to downward trend from his recent rhabdo.  He had evidence of coronary artery calcifications on chest CT in 2014.  Nuclear stress test showed mild reversibility in the inferoapical wall worrisome for ischemia and normal LVF.  2D echo 02/2016 showed normal LVF. Given evidence of some degree of CAD on Chest CT with coronary calcification in 2014 and ischemia in the inferoapical wall, recommend proceeding with left heart cath on Monday.  Cardiac catheterization was discussed with the patient fully. The patient understands that risks include but are not limited to stroke (  1 in 1000), death (1 in 1000), kidney failure [usually temporary] (1 in 500), bleeding (1 in 200), allergic reaction [possibly serious] (1 in 200).  The patient understands and is willing to proceed.    2.  HTN - controlled on BB.  3.  COPD on home O2  4.  Type II DM - on no meds.  Check HbA1C  5.  Coronary artery calcifications on chest CT in 2014 - Continue ASA/statin.  Check FLP in am.   6.  Acute on chronic kidney disease secondary to recent Rhabdo - creatinine continues to trend downward - 1.54 today.   7.  Anemia ? Etiology - Hbg now down to 8.3.  Will hemoccult stools, check iron panel, B12 and folate.  Possibly due to chronic disease and alcoholosim   Armanda Magicraci Carleah Yablonski, MD  03/22/2016  12:55 PM

## 2016-03-22 NOTE — Progress Notes (Signed)
Spoke with son Alcus DadRandall Lenn, and left message with other son Dot BeenRonald POA. Given approval for pt to sign own consent.

## 2016-03-22 NOTE — Plan of Care (Signed)
Problem: Consults Goal: Cardiac Cath Patient Education (See Patient Education module for education specifics.)  Outcome: Progressing Cardiac catheterization with possible percutaneous coronary intervention discussed with pt. Pt verbalizes understanding and is wanting to go through with the procedure scheduled on Monday. Consent obtained. Pt has currently no chest pain at this time. Cardiac cath video put on for pt.
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
## 2016-03-23 DIAGNOSIS — R079 Chest pain, unspecified: Secondary | ICD-10-CM | POA: Diagnosis not present

## 2016-03-23 DIAGNOSIS — N189 Chronic kidney disease, unspecified: Secondary | ICD-10-CM | POA: Diagnosis not present

## 2016-03-23 DIAGNOSIS — I10 Essential (primary) hypertension: Secondary | ICD-10-CM | POA: Diagnosis not present

## 2016-03-23 DIAGNOSIS — R7989 Other specified abnormal findings of blood chemistry: Secondary | ICD-10-CM | POA: Diagnosis not present

## 2016-03-23 DIAGNOSIS — J449 Chronic obstructive pulmonary disease, unspecified: Secondary | ICD-10-CM | POA: Diagnosis not present

## 2016-03-23 LAB — GLUCOSE, CAPILLARY
GLUCOSE-CAPILLARY: 156 mg/dL — AB (ref 65–99)
Glucose-Capillary: 96 mg/dL (ref 65–99)
Glucose-Capillary: 173 mg/dL — ABNORMAL HIGH (ref 65–99)
Glucose-Capillary: 86 mg/dL (ref 65–99)

## 2016-03-23 LAB — CBC WITH DIFFERENTIAL/PLATELET
BASOS ABS: 0 10*3/uL (ref 0.0–0.1)
Basophils Relative: 0 %
EOS PCT: 5 %
Eosinophils Absolute: 0.5 10*3/uL (ref 0.0–0.7)
HEMATOCRIT: 28 % — AB (ref 39.0–52.0)
Hemoglobin: 8.9 g/dL — ABNORMAL LOW (ref 13.0–17.0)
LYMPHS ABS: 2.1 10*3/uL (ref 0.7–4.0)
LYMPHS PCT: 22 %
MCH: 28.6 pg (ref 26.0–34.0)
MCHC: 31.8 g/dL (ref 30.0–36.0)
MCV: 90 fL (ref 78.0–100.0)
MONO ABS: 0.5 10*3/uL (ref 0.1–1.0)
MONOS PCT: 5 %
NEUTROS ABS: 6.5 10*3/uL (ref 1.7–7.7)
Neutrophils Relative %: 68 %
Platelets: 183 10*3/uL (ref 150–400)
RBC: 3.11 MIL/uL — ABNORMAL LOW (ref 4.22–5.81)
RDW: 15.7 % — AB (ref 11.5–15.5)
WBC: 9.6 10*3/uL (ref 4.0–10.5)

## 2016-03-23 LAB — HEMOGLOBIN A1C
Hgb A1c MFr Bld: 5.6 % (ref 4.8–5.6)
Mean Plasma Glucose: 114 mg/dL

## 2016-03-23 LAB — BASIC METABOLIC PANEL
ANION GAP: 7 (ref 5–15)
BUN: 23 mg/dL — AB (ref 6–20)
CHLORIDE: 105 mmol/L (ref 101–111)
CO2: 27 mmol/L (ref 22–32)
Calcium: 8.6 mg/dL — ABNORMAL LOW (ref 8.9–10.3)
Creatinine, Ser: 1.33 mg/dL — ABNORMAL HIGH (ref 0.61–1.24)
GFR calc Af Amer: 60 mL/min — ABNORMAL LOW (ref >60)
GFR calc non Af Amer: 51 mL/min — ABNORMAL LOW (ref >60)
GLUCOSE: 122 mg/dL — AB (ref 65–99)
POTASSIUM: 3.6 mmol/L (ref 3.5–5.1)
Sodium: 139 mmol/L (ref 135–145)

## 2016-03-23 LAB — LIPID PANEL
CHOL/HDL RATIO: 3.5 ratio
Cholesterol: 113 mg/dL (ref 0–200)
HDL: 32 mg/dL — AB (ref >40)
LDL CALC: 63 mg/dL (ref 0–99)
Triglycerides: 90 mg/dL (ref <150)
VLDL: 18 mg/dL (ref 0–40)

## 2016-03-23 LAB — FERRITIN: Ferritin: 286 ng/mL (ref 24–336)

## 2016-03-23 NOTE — Progress Notes (Signed)
SUBJECTIVE:  No complaints today  OBJECTIVE:   Vitals:   Vitals:   03/22/16 1605 03/22/16 2008 03/23/16 0500 03/23/16 0912  BP:  (!) 120/48 134/60   Pulse:  72 64   Resp:  18 18   Temp:  98.6 F (37 C) 98.1 F (36.7 C)   TempSrc:  Oral Oral   SpO2: 100% 99% 98% 97%  Weight:   143 lb 14.4 oz (65.3 kg)   Height:       I&O's:   Intake/Output Summary (Last 24 hours) at 03/23/16 1041 Last data filed at 03/23/16 0800  Gross per 24 hour  Intake             1200 ml  Output              400 ml  Net              800 ml   TELEMETRY: Reviewed telemetry pt in NSR:     PHYSICAL EXAM General: Well developed, well nourished, in no acute distress Head: Eyes PERRLA, No xanthomas.   Normal cephalic and atramatic  Lungs:   Clear bilaterally to auscultation and percussion. Heart:   HRRR S1 S2 Pulses are 2+ & equal. Abdomen: Bowel sounds are positive, abdomen soft and non-tender without masses  Msk:  Back normal, normal gait. Normal strength and tone for age. Extremities:   No clubbing, cyanosis or edema.  DP +1 Neuro: Alert and oriented X 3. Psych:  Good affect, responds appropriately   LABS: Basic Metabolic Panel:  Recent Labs  09/81/19 0050  NA 141  K 4.2  CL 105  CO2 28  GLUCOSE 148*  BUN 33*  CREATININE 1.54*  CALCIUM 8.6*  MG 1.7   Liver Function Tests:  Recent Labs  03/21/16 0050  AST 29  ALT 42  ALKPHOS 79  BILITOT 0.3  PROT 5.7*  ALBUMIN 2.9*   No results for input(s): LIPASE, AMYLASE in the last 72 hours. CBC:  Recent Labs  03/21/16 0050  WBC 9.1  HGB 8.3*  HCT 25.9*  MCV 88.7  PLT 181   Cardiac Enzymes:  Recent Labs  03/21/16 0050 03/21/16 0656 03/21/16 1453  TROPONINI <0.03 <0.03 <0.03   BNP: Invalid input(s): POCBNP D-Dimer: No results for input(s): DDIMER in the last 72 hours. Hemoglobin A1C: No results for input(s): HGBA1C in the last 72 hours. Fasting Lipid Panel:  Recent Labs  03/23/16 0403  CHOL 113  HDL 32*    LDLCALC 63  TRIG 90  CHOLHDL 3.5   Thyroid Function Tests:  Recent Labs  03/21/16 0050  TSH 1.736   Anemia Panel:  Recent Labs  03/22/16 1338 03/23/16 0403  VITAMINB12 415  --   FERRITIN  --  286  TIBC 239*  --   IRON 31*  --    Coag Panel:   Lab Results  Component Value Date   INR 1.04 03/22/2016   INR 1.13 03/02/2016   INR 1.05 01/24/2013    RADIOLOGY: X-ray Chest Pa And Lateral  Result Date: 03/21/2016 CLINICAL DATA:  Chest pain.  Shortness of breath . EXAM: CHEST  2 VIEW COMPARISON:  03/20/2016. FINDINGS: Mediastinum hilar structures normal. Heart size stable. Low lung volumes with mild bibasilar atelectasis. No pleural effusion or pneumothorax. IMPRESSION: Low lung volumes with mild bibasilar and/or infiltrates again noted. Bibasilar pneumonia cannot be excluded. Similar findings noted on prior exam . Electronically Signed   By: Maisie Fus  Register   On: 03/21/2016  07:00   Ct Head Wo Contrast  Result Date: 03/02/2016 CLINICAL DATA:  Patient found under bad at home by a friend. Nonverbal. EXAM: CT HEAD WITHOUT CONTRAST TECHNIQUE: Contiguous axial images were obtained from the base of the skull through the vertex without intravenous contrast. COMPARISON:  02/08/2016 CT. FINDINGS: Brain: No evidence of acute infarction, hemorrhage, extra-axial collection, ventriculomegaly, or mass effect. Moderate atrophy, advanced for age. Hypoattenuation of white matter is suspected, likely chronic microvascular ischemic change. Vascular: Hyperdense RIGHT vertebral felt to represent atheromatous change rather than thrombosis. Moderate vascular calcification in the carotid siphon regions. Skull: Negative for fracture or focal lesion. Sinuses/Orbits: No acute findings. Other: Calcific pannus surrounds the odontoid. 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 Retracted LEFT TM. 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 If further investigation desired, consider elective ENT consultation. Compared with priors, similar appearance. IMPRESSION: No acute intracranial findings.  Atrophy and small vessel disease. No change from priors. Electronically Signed   By: Elsie Stain M.D.   On: 03/02/2016 16:59  US Renal  Result Date: 03/03/2016 CLINICAL DATA:  Acute renal failure and sepsis. EXAM: RENAL / URINARY TRACT ULTRASOUND COMPLETE COMPARISON:  CT 01/06/2016 FINDINGS: Right Kidney: Length: 8.5 cm. Echogenicity within normal limits. No mass or hydronephrosis visualized. Left Kidney: Length: 9.2 cm. Echogenicity within normal limits. No mass or hydronephrosis visualized. Bladder: Not imaged. IMPRESSION: No acute urinary pathology.  No evidence of hydronephrosis. Electronically Signed   By: Jolaine Click M.D.   On: 03/03/2016 11:40  Nm Myocar Multi W/spect W/wall Motion / Ef  Result Date: 03/21/2016 CLINICAL DATA:  75 year old with chest pain. EXAM: MYOCARDIAL IMAGING WITH SPECT (REST AND PHARMACOLOGIC-STRESS) GATED LEFT VENTRICULAR WALL MOTION STUDY LEFT VENTRICULAR EJECTION FRACTION TECHNIQUE: Standard myocardial SPECT imaging was performed after resting intravenous injection of 10 mCi Tc-102m tetrofosmin. Subsequently, intravenous infusion of Lexiscan was performed under the supervision of the Cardiology staff. At peak effect of the drug, 30 mCi Tc-24m tetrofosmin was injected intravenously and standard myocardial SPECT imaging was performed. Quantitative gated imaging was also performed to evaluate left ventricular wall motion, and estimate left ventricular ejection fraction. COMPARISON:  None. FINDINGS: Perfusion: There is mild reversibility along the apical segment of the inferior wall. No large infarct. No other areas are suspicious for reversibility. Wall Motion: Normal left ventricular wall motion. No left ventricular dilation. Left Ventricular Ejection Fraction: 65 % End  diastolic volume 82 ml End systolic volume 29 ml IMPRESSION: 1. Mild reversibility involving the apical segment of the inferior wall. Findings concerning for a small area of ischemia. 2. Normal left ventricular wall motion. 3. Left ventricular ejection fraction is 65%. 4. Non invasive risk stratification*: Intermediate *2012 Appropriate Use Criteria for Coronary Revascularization Focused Update: J Am Coll Cardiol. 2012;59(9):857-881. http://content.dementiazones.com.aspx?articleid=[email or web address] Electronically Signed   By: Richarda Overlie M.D.   On: 03/21/2016 15:02   Dg Chest Port 1 View  Result Date: 03/02/2016 CLINICAL DATA:  Sepsis. EXAM: PORTABLE CHEST 1 VIEW COMPARISON:  Portable chest radiograph earlier this day at 1521 hour FINDINGS: Lower lung volumes from prior. Heart size and mediastinal contours are unchanged. Mild elevation of right hemidiaphragm likely related to lower lung volumes. Upper lobe emphysema. Bibasilar opacities likely scarring. No confluent airspace disease to suggest pneumonia. No evidence of pleural effusion. No pneumothorax. IMPRESSION: Lower lung volumes from prior exam. Upper lobe emphysema. Stable bibasilar scarring. Electronically Signed   By: Lujean Rave.D.  On: 03/02/2016 19:45  Dg Chest Portable 1 View  Result Date: 03/02/2016 CLINICAL DATA:  Rhonchi.  Abnormal breath sounds. EXAM: PORTABLE CHEST 1 VIEW COMPARISON:  02/10/2016 FINDINGS: Heart is normal size. Continued increased markings at the lung bases, likely scarring, stable. No confluent opacities or effusions. No acute bony abnormality. IMPRESSION: Bibasilar scarring, stable.  No active disease. Electronically Signed   By: Charlett NoseKevin  Dover M.D.   On: 03/02/2016 15:33  Dg Swallowing Func-speech Pathology  Result Date: 03/21/2016 Objective Swallowing Evaluation: Type of Study: MBS-Modified Barium Swallow Study Patient Details Name: [name] MRN: [id number] Date of Birth: 09/30/1940 Today's Date: 03/21/2016 Time:  SLP Start Time (ACUTE ONLY): 1419-SLP Stop Time (ACUTE ONLY): 1431 SLP Time Calculation (min) (ACUTE ONLY): 12 min Past Medical History: Past Medical History: Diagnosis Date . Alcoholism (HCC)  . Anemia  . Anxiety  . Bilateral lower extremity edema 09/03/2012  Possibly secondary to calcium channel blocker. . Chronic back pain  . Chronic respiratory failure with hypoxia (HCC)  . COPD (chronic obstructive pulmonary disease) (HCC)  . Depression  . DJD (degenerative joint disease)  . Esophageal dysmotility 07/04/2011 . Essential hypertension, benign  . Myocardial infarction (HCC)  . Paroxysmal atrial fibrillation (HCC) 08/29/2012 . Partial small bowel obstruction (HCC) 09/02/2012 . Peripheral neuropathy (HCC)  . Pneumonia  . PTSD (post-traumatic stress disorder)  . Type II or unspecified type diabetes mellitus without mention of complication, not stated as uncontrolled  Past Surgical History: Past Surgical History: Procedure Laterality Date . APPENDECTOMY   . HERNIA REPAIR    X 3 . PROSTATE SURGERY   . Spinal surgeries    X 4 HPI: 75 y.o.Caucasian male, [place] [employer] [place] with medical history significant for PTSD, alcoholism, depression, anxiety, COPD with chronic hypoxic respiratory failure, and hypertension who was admitted 03/02/16-8/4/17after being found down at his home by a friend. During this admission he was recommended to have Dys 1 diet and honey thick liquids. He says that he frequently gets PNA - often multiple times per year. Subjective: pt says he has trouble with his esophagus Assessment / Plan / Recommendation CHL IP CLINICAL IMPRESSIONS 03/21/2016 Therapy Diagnosis WFL;Suspected primary esophageal dysphagia Clinical Impression Pt's oropharyngeal swallow is within gross functional limits given age and lack of dentition. Frequent coughing noted during study was not associated with any airway compromise. He appears to be more at risk for post-prandial aspiration given what appeared to be reduced  clearance of the esophagus, although MD was not present to confirm. Recommend Dys 2 diet given lack of dentition, consistency with home diet, and esophageal concerns. He may have thin liquids as tolerated. SLP to f/u for tolerance and reinforcement of aspiration and esophageal precautions. Impact on safety and function Moderate aspiration risk   CHL IP TREATMENT RECOMMENDATION 03/21/2016 Treatment Recommendations Therapy as outlined in treatment plan below   Prognosis 03/21/2016 Prognosis for Safe Diet Advancement Fair Barriers to Reach Goals Other (Comment) Barriers/Prognosis Comment -- CHL IP DIET RECOMMENDATION 03/21/2016 SLP Diet Recommendations Dysphagia 2 (Fine chop) solids;Thin liquid Liquid Administration via Cup;Straw Medication Administration Whole meds with puree Compensations Minimize environmental distractions;Slow rate;Small sips/bites;Follow solids with liquid Postural Changes Remain semi-upright after after feeds/meals (Comment);Seated upright at 90 degrees   CHL IP OTHER RECOMMENDATIONS 03/21/2016 Recommended Consults Consider esophageal assessment Oral Care Recommendations Oral care BID Other Recommendations --   CHL IP FOLLOW UP RECOMMENDATIONS 03/21/2016 Follow up Recommendations (No Data)   CHL IP FREQUENCY AND DURATION 03/21/2016 Speech Therapy Frequency (ACUTE ONLY)  min 2x/week Treatment Duration 2 weeks      CHL IP ORAL PHASE 03/21/2016 Oral Phase WFL Oral - Pudding Teaspoon -- Oral - Pudding Cup -- Oral - Honey Teaspoon -- Oral - Honey Cup -- Oral - Nectar Teaspoon -- Oral - Nectar Cup -- Oral - Nectar Straw -- Oral - Thin Teaspoon -- Oral - Thin Cup -- Oral - Thin Straw -- Oral - Puree -- Oral - Mech Soft -- Oral - Regular -- Oral - Multi-Consistency -- Oral - Pill -- Oral Phase - Comment --  CHL IP PHARYNGEAL PHASE 03/21/2016 Pharyngeal Phase WFL Pharyngeal- Pudding Teaspoon -- Pharyngeal -- Pharyngeal- Pudding Cup -- Pharyngeal -- Pharyngeal- Honey Teaspoon -- Pharyngeal -- Pharyngeal- Honey Cup  -- Pharyngeal -- Pharyngeal- Nectar Teaspoon -- Pharyngeal -- Pharyngeal- Nectar Cup -- Pharyngeal -- Pharyngeal- Nectar Straw -- Pharyngeal -- Pharyngeal- Thin Teaspoon -- Pharyngeal -- Pharyngeal- Thin Cup -- Pharyngeal -- Pharyngeal- Thin Straw -- Pharyngeal -- Pharyngeal- Puree -- Pharyngeal -- Pharyngeal- Mechanical Soft -- Pharyngeal -- Pharyngeal- Regular -- Pharyngeal -- Pharyngeal- Multi-consistency -- Pharyngeal -- Pharyngeal- Pill -- Pharyngeal -- Pharyngeal Comment --  CHL IP CERVICAL ESOPHAGEAL PHASE 03/21/2016 Cervical Esophageal Phase WFL Pudding Teaspoon -- Pudding Cup -- Honey Teaspoon -- Honey Cup -- Nectar Teaspoon -- Nectar Cup -- Nectar Straw -- Thin Teaspoon -- Thin Cup -- Thin Straw -- Puree -- Mechanical Soft -- Regular -- Multi-consistency -- Pill -- Cervical Esophageal Comment -- No flowsheet data found. Maxcine Hamaiewonsky, Laura 03/21/2016, 2:53 PM  Maxcine HamLaura Paiewonsky, M.A. CCC-SLP [phone]              Assessment/Plan: 75 y.o.Caucasian male, [place] [employer] [place] with medical history significant for PTSD, alcoholism, depression, anxiety, COPD with chronic hypoxic respiratory failure, and hypertension who was admitted 03/02/16-8/4/17after being found down at his home by a friend. Patient's friend reports that he had not seen or heard from the patient and at least one month and went to his house to check on him. He reportedly found the patient lying on the floor under his hospital bed with his O2 canister tipped over. Patient's friend initially thought that the patient had expired. He was treated for rhabdomyalosis and [place]. [facility] SNF [facility] with chest pain. His Troponin was elevated (0.04). [facility] after he had recurrent chest pain.    Principal Problem: Chest pain with moderate risk of acute coronary syndrome Active Problems: DM type 2 (diabetes mellitus, type 2) (HCC) HTN  (hypertension) Chronic respiratory failure with hypoxia (HCC) COPD (chronic obstructive pulmonary disease) (HCC) Elevated troponin Chronic renal insufficiency, stage III (moderate) Depression PTSD (post-traumatic stress disorder) Rhabdomyolysis  1.  Chest pain but very poor historian.  He has SOB at baseline from COPD and is on home O2 at 3L but this get much worse when he has chest pain. His trop is negative here and suspect that the slight elevation at outside hospital was due to downward trend from his recent rhabdo. He had evidence of coronary artery calcifications on chest CT in 2014. Nuclear stress test showed mild reversibility in the inferoapical wall worrisome for ischemia and normal LVF.  2D echo 02/2016 showed normal LVF. Given evidence of some degree of CAD on Chest CT with coronary calcification in 2014 and ischemia in the inferoapical wall, recommend proceeding with left heart cath on Monday.  Cardiac catheterization was discussed with the patient fully. The patient  understands that risks include but are not limited to stroke (1 in 1000), death (1 in 1000), kidney failure [usually temporary] (1 in 500), bleeding (1 in 200), allergic reaction [possibly serious] (1 in 200).  The patient understands and is willing to proceed.    2.  HTN - controlled on BB.  3.  COPD on home O2  4.  Type II DM - on no meds.  Check HbA1C  5.  Coronary artery calcifications on chest CT in 2014 - Continue ASA/statin.  LDL at goal at 63  6.  Acute on chronic kidney disease secondary to recent Rhabdo - creatinine continues to trend downward - 1.54 yesterday.  Will repeat today.   7.  Anemia ? Etiology - Hbg now down to 8.3.  Will hemoccult stools, check iron panel, B12 and folate.  Possibly due to chronic disease and alcoholosim   Armanda Magic, MD  03/23/2016  10:41 AM

## 2016-03-24 ENCOUNTER — Encounter (HOSPITAL_COMMUNITY): Payer: Self-pay

## 2016-03-24 DIAGNOSIS — J431 Panlobular emphysema: Secondary | ICD-10-CM

## 2016-03-24 DIAGNOSIS — J449 Chronic obstructive pulmonary disease, unspecified: Secondary | ICD-10-CM | POA: Diagnosis not present

## 2016-03-24 DIAGNOSIS — M6282 Rhabdomyolysis: Secondary | ICD-10-CM | POA: Diagnosis not present

## 2016-03-24 DIAGNOSIS — N189 Chronic kidney disease, unspecified: Secondary | ICD-10-CM | POA: Diagnosis not present

## 2016-03-24 DIAGNOSIS — R7989 Other specified abnormal findings of blood chemistry: Secondary | ICD-10-CM | POA: Diagnosis not present

## 2016-03-24 DIAGNOSIS — R079 Chest pain, unspecified: Secondary | ICD-10-CM | POA: Diagnosis not present

## 2016-03-24 LAB — BASIC METABOLIC PANEL
ANION GAP: 9 (ref 5–15)
BUN: 22 mg/dL — ABNORMAL HIGH (ref 6–20)
CO2: 23 mmol/L (ref 22–32)
Calcium: 8.6 mg/dL — ABNORMAL LOW (ref 8.9–10.3)
Chloride: 109 mmol/L (ref 101–111)
Creatinine, Ser: 1.36 mg/dL — ABNORMAL HIGH (ref 0.61–1.24)
GFR calc Af Amer: 58 mL/min — ABNORMAL LOW (ref >60)
GFR, EST NON AFRICAN AMERICAN: 50 mL/min — AB (ref >60)
GLUCOSE: 111 mg/dL — AB (ref 65–99)
POTASSIUM: 4.6 mmol/L (ref 3.5–5.1)
Sodium: 141 mmol/L (ref 135–145)

## 2016-03-24 LAB — GLUCOSE, CAPILLARY
Glucose-Capillary: 95 mg/dL (ref 65–99)
Glucose-Capillary: 92 mg/dL (ref 65–99)

## 2016-03-24 LAB — FOLATE RBC
FOLATE, HEMOLYSATE: 343.9 ng/mL
Folate, RBC: 1278 ng/mL (ref >498)
Hematocrit: 26.9 % — ABNORMAL LOW (ref 37.5–51.0)

## 2016-03-24 LAB — HEMOGLOBIN A1C
Hgb A1c MFr Bld: 5.4 % (ref 4.8–5.6)
Mean Plasma Glucose: 108 mg/dL

## 2016-03-24 MED ORDER — TAMSULOSIN HCL 0.4 MG PO CAPS
0.4000 mg | ORAL_CAPSULE | Freq: Every day | ORAL | Status: DC
  Administered 2016-03-24: 0.4 mg via ORAL
  Filled 2016-03-24: qty 1

## 2016-03-24 MED ORDER — ATORVASTATIN CALCIUM 40 MG PO TABS
40.0000 mg | ORAL_TABLET | Freq: Every day | ORAL | Status: DC
  Administered 2016-03-24: 40 mg via ORAL
  Filled 2016-03-24: qty 1

## 2016-03-24 MED ORDER — IPRATROPIUM-ALBUTEROL 0.5-2.5 (3) MG/3ML IN SOLN
3.0000 mL | Freq: Three times a day (TID) | RESPIRATORY_TRACT | Status: DC
  Administered 2016-03-24 – 2016-03-25 (×2): 3 mL via RESPIRATORY_TRACT
  Filled 2016-03-24 (×2): qty 3

## 2016-03-24 MED ORDER — SODIUM CHLORIDE 0.9 % WEIGHT BASED INFUSION
3.0000 mL/kg/h | INTRAVENOUS | Status: AC
Start: 2016-03-25 — End: 2016-03-25
  Administered 2016-03-25: 3 mL/kg/h via INTRAVENOUS

## 2016-03-24 MED ORDER — SODIUM CHLORIDE 0.9 % WEIGHT BASED INFUSION
1.0000 mL/kg/h | INTRAVENOUS | Status: DC
  Administered 2016-03-25: 1 mL/kg/h via INTRAVENOUS

## 2016-03-24 MED ORDER — HYPROMELLOSE (GONIOSCOPIC) 2.5 % OP SOLN
1.0000 [drp] | Freq: Three times a day (TID) | OPHTHALMIC | Status: DC | PRN
  Filled 2016-03-24: qty 15

## 2016-03-24 MED ORDER — ASPIRIN 81 MG PO CHEW
81.0000 mg | CHEWABLE_TABLET | ORAL | Status: AC
Start: 2016-03-25 — End: 2016-03-25
  Administered 2016-03-25: 81 mg via ORAL
  Filled 2016-03-24: qty 1

## 2016-03-24 MED FILL — Heparin Sodium (Porcine) Inj 5000 Unit/ML: INTRAMUSCULAR | Qty: 1 | Status: AC

## 2016-03-24 NOTE — Consult Note (Deleted)
Patient Profile: 12M with COPD on 3L home oxygen, DM2, HTN, alcoholism, anxiety, depression, HTN, and AF with recent diagnosis of dehydration and rhabdo after being found down, who presents back to hospital with SOB and chest pain.   Subjective: Denies any CP currently. No dyspnea. His left eye feels "gritty". Feel like he has trash in his eye. Irritating.   Objective: Vital signs in last 24 hours: Temp:  [98.1 F (36.7 C)-98.5 F (36.9 C)] 98.1 F (36.7 C) (08/21 0500) Pulse Rate:  [58-70] 58 (08/21 0500) Resp:  [18] 18 (08/20 1628) BP: (130-137)/(54-63) 137/63 (08/21 0500) SpO2:  [98 %-100 %] 100 % (08/21 0822) Weight:  [139 lb 9.6 oz (63.3 kg)] 139 lb 9.6 oz (63.3 kg) (08/21 0500) Last BM Date: 03/21/16  Intake/Output from previous day: 08/20 0701 - 08/21 0700 In: 960 [P.O.:960] Out: 150 [Urine:150] Intake/Output this shift: Total I/O In: -  Out: 275 [Urine:275]  Medications Current Facility-Administered Medications  Medication Dose Route Frequency Provider Last Rate Last Dose  . 0.9 %  sodium chloride infusion  250 mL Intravenous PRN Quintella Reichertraci R Turner, MD      . 0.9% sodium chloride infusion  1 mL/kg/hr Intravenous Continuous Quintella Reichertraci R Turner, MD 64.4 mL/hr at 03/24/16 0658 1 mL/kg/hr at 03/24/16 0658  . albuterol (PROVENTIL) (2.5 MG/3ML) 0.083% nebulizer solution 3 mL  3 mL Inhalation Q6H PRN Glori Luisaniel Friedman, MD      . aspirin EC tablet 81 mg  81 mg Oral Daily Glori Luisaniel Friedman, MD   81 mg at 03/23/16 0824  . atorvastatin (LIPITOR) tablet 40 mg  40 mg Oral q1800 Glori Luisaniel Friedman, MD   40 mg at 03/23/16 1642  . enoxaparin (LOVENOX) injection 40 mg  40 mg Subcutaneous Q24H Glori Luisaniel Friedman, MD   40 mg at 03/23/16 1000  . ipratropium-albuterol (DUONEB) 0.5-2.5 (3) MG/3ML nebulizer solution 3 mL  3 mL Nebulization QID Glori Luisaniel Friedman, MD   3 mL at 03/24/16 0814  . iron polysaccharides (NIFEREX) capsule 150 mg  150 mg Oral Daily Glori Luisaniel Friedman, MD   150 mg at 03/24/16 [id number]  .  metoprolol (LOPRESSOR) tablet 50 mg  50 mg Oral BID Glori Luisaniel Friedman, MD   50 mg at 03/24/16 [id number]  . mometasone-formoterol (DULERA) 200-5 MCG/ACT inhaler 2 puff  2 puff Inhalation BID Glori Luisaniel Friedman, MD   2 puff at 03/24/16 [phone]  . pantoprazole (PROTONIX) EC tablet 40 mg  40 mg Oral Daily Glori Luisaniel Friedman, MD   40 mg at 03/24/16 [id number]  . PARoxetine (PAXIL) tablet 20 mg  20 mg Oral Daily Glori Luisaniel Friedman, MD   20 mg at 03/24/16 [id number]  . sodium chloride flush (NS) 0.9 % injection 3 mL  3 mL Intravenous Q12H Quintella Reichertraci R Turner, MD   3 mL at 03/23/16 2128  . sodium chloride flush (NS) 0.9 % injection 3 mL  3 mL Intravenous PRN Quintella Reichertraci R Turner, MD      . tamsulosin (FLOMAX) capsule 0.4 mg  0.4 mg Oral QPC supper Glori Luisaniel Friedman, MD   0.4 mg at 03/23/16 1642  . tiotropium (SPIRIVA) inhalation capsule 18 mcg  18 mcg Inhalation Daily Glori Luisaniel Friedman, MD   18 mcg at 03/24/16 [phone]    PE: General appearance: alert, cooperative and no distress Neck: no carotid bruit and no JVD Lungs: clear to auscultation bilaterally Heart: regular rate and rhythm, S1, S2 normal, no murmur, click, rub or gallop Extremities: no LEE Pulses: 2+ and symmetric Skin: warm and dry Neurologic: Grossly  normal  Lab Results:   Recent Labs  03/23/16 1049  WBC 9.6  HGB 8.9*  HCT 28.0*  PLT 183   BMET  Recent Labs  03/23/16 1049  NA 139  K 3.6  CL 105  CO2 27  GLUCOSE 122*  BUN 23*  CREATININE 1.33*  CALCIUM 8.6*   PT/INR  Recent Labs  03/22/16 1338  LABPROT 13.6  INR 1.04   Cholesterol  Recent Labs  03/23/16 0403  CHOL 113   Cardiac Panel (last 3 results)  Recent Labs  03/21/16 1453  TROPONINI <0.03     Studies/Results: NST   Assessment/Plan    Principal Problem:   Chest pain with moderate risk of acute coronary syndrome Active Problems:   DM type 2 (diabetes mellitus, type 2) (HCC)   HTN (hypertension)   Depression   Chronic respiratory failure with hypoxia (HCC)   PTSD (post-traumatic  stress disorder)   COPD (chronic obstructive pulmonary disease) (HCC)   Elevated troponin   Rhabdomyolysis   Chronic renal insufficiency, stage III (moderate)   1. Chest Pain with Moderate Risk for Cardiac Etiology: currently CP free. His trop is negative here and suspect that the slight elevation at outside hospital was due to downward trend from his recent rhabdo. He had evidence of coronary artery calcifications on chest CT in 2014. Nuclear stress test showed mild reversibility in the inferoapical wall worrisome for ischemia and normal LVF. 2D echo 02/2016 showed normal LVF. Given evidence of some degree of CAD on Chest CT with coronary calcification in 2014 and ischemia in the inferoapical wall, plan for LHC. Unable to schedule cath today. Will plan for tomorrow.  2. HTN - controlled on current regime.   3. COPD on home O2  4. Type II DM - on Metformin at home. controlled with HbA1C of 5.4.   5. Coronary artery calcifications on chest CT in 2014 - Continue ASA/statin. LDL at goal at 63. [facility].   6. Acute on chronic kidney disease secondary to recent Rhabdo - creatinine continues to trend downward - 1.54>>1.33. Will repeat  BMP today.   7. Anemia ? Etiology - Hbg now down to 8.9. Iron low at 31 but ferritin WLN. Vit B 12 normal. Folate pending.Will hemoccult stools.Possibly due to chronic disease and alcoholism  8. Dry Eyes: patient complains of left eye irritation. Feels "gritty". Will do trial of artificial tears.     LOS: 0 days    Brittainy M. Delmer IslamSimmons, PA-C 03/24/2016 11:02 AM  Attending Note:   See progress note   Vesta MixerPhilip J. Lemond Griffee, Montez HagemanJr., MD, [facility] 03/24/2016, 12:21 PM [address],  [address] Office [phone] Pager [phone]

## 2016-03-24 NOTE — Evaluation (Signed)
Occupational Therapy Evaluation Patient Details Name: [name] MRN: [id number] DOB: 01/10/1941 Today's Date: 03/24/2016    History of Present Illness 75 yo M admitted 03/02/2016 after being found down, sent to SNF and now presenting with SOB and chest pain.  PMHx:  acute encephalopathy, rhabdomyolysis, acute renal failure,  depression, anxiety, COPD, chronic hypoxic respiratory failure, HTN   Clinical Impression   This 75 yo male admitted for above presents to acute OT with deficits below (see OT problem list) thus affecting his ability to care for himself. He will benefit from acute OT with follow up OT back at SNF to work on getting stronger and thus more independent with his basic ADLs.    Follow Up Recommendations  SNF    Equipment Recommendations  Other (comment) (TBD at next venue)       Precautions / Restrictions Precautions Precautions: Fall Restrictions Weight Bearing Restrictions: No      Mobility Bed Mobility Overal bed mobility: Needs Assistance Bed Mobility: Sidelying to Sit;Supine to Sit     Supine to sit: Supervision;HOB elevated (use of rail) Sit to supine: Min assist      Transfers Overall transfer level: Needs assistance   Transfers: Sit to/from Stand;Stand Pivot Transfers Sit to Stand: Min assist Stand pivot transfers: Min assist            Balance Overall balance assessment: Needs assistance Sitting-balance support: No upper extremity supported;Feet supported Sitting balance-Leahy Scale: Fair     Standing balance support: Bilateral upper extremity supported;During functional activity Standing balance-Leahy Scale: Poor                              ADL Overall ADL's : Needs assistance/impaired Eating/Feeding: Independent;Sitting   Grooming: Set up;Supervision/safety;Sitting   Upper Body Bathing: Supervision/ safety;Set up;Sitting   Lower Body Bathing: Moderate assistance (min A sit<>stand)   Upper Body Dressing :  Moderate assistance;Sitting   Lower Body Dressing: Maximal assistance (min A sit<>stand)   Toilet Transfer: Minimal assistance;Stand-pivot;BSC   Toileting- Clothing Manipulation and Hygiene: Total assistance (min A sit<>stand)                         Pertinent Vitals/Pain Pain Assessment: No/denies pain        Extremity/Trunk Assessment Upper Extremity Assessment Upper Extremity Assessment: Generalized weakness              Cognition Arousal/Alertness: Awake/alert Behavior During Therapy: Flat affect Overall Cognitive Status: No family/caregiver present to determine baseline cognitive functioning                                Home Living Family/patient expects to be discharged to:: Skilled nursing facility                                             OT Diagnosis: Generalized weakness   OT Problem List: Decreased strength;Impaired balance (sitting and/or standing);Decreased safety awareness;Decreased knowledge of use of DME or AE   OT Treatment/Interventions: Self-care/ADL training;Balance training;Therapeutic activities;Patient/family education    OT Goals(Current goals can be found in the care plan section) Acute Rehab OT Goals Patient Stated Goal: to go back to rehab and then hopefully home OT Goal Formulation: With patient Time For Goal  Achievement: 03/31/16 Potential to Achieve Goals: Good  OT Frequency: Min 2X/week   Barriers to D/C: Decreased caregiver support             End of Session Equipment Utilized During Treatment: Gait belt (O2 at 3 liters) Nurse Communication:  (pt had a bowel movement, reports he is constipated)  Activity Tolerance: Patient tolerated treatment well Patient left: in bed;with call bell/phone within reach;with bed alarm set   Time: 1453-1530 OT Time Calculation (min): 37 min Charges:  OT General Charges $OT Visit: 1 Procedure OT Evaluation $OT Eval Moderate Complexity: 1  Procedure OT Treatments $Self Care/Home Management : 8-22 mins  Evette GeorgesLeonard, [name] [phone] 03/24/2016, 4:19 PM

## 2016-03-24 NOTE — Care Management Obs Status (Signed)
MEDICARE OBSERVATION STATUS NOTIFICATION   Patient Details  Name: [name] MRN: [id number] Date of Birth: 11/21/1940   Medicare Observation Status Notification Given:  Yes    Gala LewandowskyGraves-Bigelow, Bernedette Auston Kaye, RN 03/24/2016, 4:18 PM

## 2016-03-24 NOTE — Progress Notes (Signed)
SLP Cancellation Note  Patient Details Name: [name] MRN: [id number] DOB: 11/05/1940   Cancelled treatment:       Reason Eval/Treat Not Completed: Medical issues which prohibited therapy. Per chart review, he is NPO pending procedure. Will f/u as able.   Maxcine Hamaiewonsky, Liandro Thelin 03/24/2016, 9:12 AM  Maxcine HamLaura Paiewonsky, M.A. CCC-SLP [phone]

## 2016-03-24 NOTE — Progress Notes (Signed)
Primary Cardiologist:  Armanda Magicraci Turner, MD    Patient Profile: [phone] with COPD on 3L home oxygen, DM2, HTN, alcoholism, anxiety, depression, HTN, and AF with recent diagnosis of dehydration and rhabdo after being found down, who presents back to hospital with SOB and chest pain.   Subjective: Denies any CP currently. No dyspnea. His left eye feels "gritty". Feel like he has trash in his eye. Irritating.   Objective: Vital signs in last 24 hours: Temp:  [98.1 F (36.7 C)-98.5 F (36.9 C)] 98.1 F (36.7 C) (08/21 0500) Pulse Rate:  [58-70] 58 (08/21 0500) Resp:  [18] 18 (08/20 1628) BP: (130-137)/(54-63) 137/63 (08/21 0500) SpO2:  [98 %-100 %] 100 % (08/21 0822) Weight:  [139 lb 9.6 oz (63.3 kg)] 139 lb 9.6 oz (63.3 kg) (08/21 0500) Last BM Date: 03/21/16  Intake/Output from previous day: 08/20 0701 - 08/21 0700 In: 960 [P.O.:960] Out: 150 [Urine:150] Intake/Output this shift: Total I/O In: -  Out: 275 [Urine:275]  Medications Current Facility-Administered Medications  Medication Dose Route Frequency Provider Last Rate Last Dose  . 0.9 %  sodium chloride infusion  250 mL Intravenous PRN Quintella Reichertraci R Turner, MD      . 0.9% sodium chloride infusion  1 mL/kg/hr Intravenous Continuous Quintella Reichertraci R Turner, MD 64.4 mL/hr at 03/24/16 0658 1 mL/kg/hr at 03/24/16 0658  . albuterol (PROVENTIL) (2.5 MG/3ML) 0.083% nebulizer solution 3 mL  3 mL Inhalation Q6H PRN Glori Luisaniel Friedman, MD      . aspirin EC tablet 81 mg  81 mg Oral Daily Glori Luisaniel Friedman, MD   81 mg at 03/23/16 0824  . atorvastatin (LIPITOR) tablet 40 mg  40 mg Oral q1800 Glori Luisaniel Friedman, MD   40 mg at 03/23/16 1642  . enoxaparin (LOVENOX) injection 40 mg  40 mg Subcutaneous Q24H Glori Luisaniel Friedman, MD   40 mg at 03/23/16 1000  . ipratropium-albuterol (DUONEB) 0.5-2.5 (3) MG/3ML nebulizer solution 3 mL  3 mL Nebulization QID Glori Luisaniel Friedman, MD   3 mL at 03/24/16 0814  . iron polysaccharides (NIFEREX) capsule 150 mg  150 mg Oral Daily Glori Luisaniel  Friedman, MD   150 mg at 03/24/16 [id number]  . metoprolol (LOPRESSOR) tablet 50 mg  50 mg Oral BID Glori Luisaniel Friedman, MD   50 mg at 03/24/16 [id number]  . mometasone-formoterol (DULERA) 200-5 MCG/ACT inhaler 2 puff  2 puff Inhalation BID Glori Luisaniel Friedman, MD   2 puff at 03/24/16 [phone]  . pantoprazole (PROTONIX) EC tablet 40 mg  40 mg Oral Daily Glori Luisaniel Friedman, MD   40 mg at 03/24/16 [id number]  . PARoxetine (PAXIL) tablet 20 mg  20 mg Oral Daily Glori Luisaniel Friedman, MD   20 mg at 03/24/16 [id number]  . sodium chloride flush (NS) 0.9 % injection 3 mL  3 mL Intravenous Q12H Quintella Reichertraci R Turner, MD   3 mL at 03/23/16 2128  . sodium chloride flush (NS) 0.9 % injection 3 mL  3 mL Intravenous PRN Quintella Reichertraci R Turner, MD      . tamsulosin (FLOMAX) capsule 0.4 mg  0.4 mg Oral QPC supper Glori Luisaniel Friedman, MD   0.4 mg at 03/23/16 1642  . tiotropium (SPIRIVA) inhalation capsule 18 mcg  18 mcg Inhalation Daily Glori Luisaniel Friedman, MD   18 mcg at 03/24/16 [phone]    PE: General appearance: alert, cooperative and no distress Neck: no carotid bruit and no JVD Lungs: clear to auscultation bilaterally Heart: regular rate and rhythm, S1, S2 normal, no murmur, click, rub or gallop Extremities: no LEE Pulses:  2+ and symmetric Skin: warm and dry Neurologic: Grossly normal  Lab Results:   Recent Labs  03/23/16 1049  WBC 9.6  HGB 8.9*  HCT 28.0*  PLT 183   BMET  Recent Labs  03/23/16 1049  NA 139  K 3.6  CL 105  CO2 27  GLUCOSE 122*  BUN 23*  CREATININE 1.33*  CALCIUM 8.6*   PT/INR  Recent Labs  03/22/16 1338  LABPROT 13.6  INR 1.04   Cholesterol  Recent Labs  03/23/16 0403  CHOL 113   Cardiac Panel (last 3 results)  Recent Labs  03/21/16 1453  TROPONINI <0.03     Studies/Results: NST   Assessment/Plan    Principal Problem:   Chest pain with moderate risk of acute coronary syndrome Active Problems:   DM type 2 (diabetes mellitus, type 2) (HCC)   HTN (hypertension)   Depression   Chronic respiratory failure  with hypoxia (HCC)   PTSD (post-traumatic stress disorder)   COPD (chronic obstructive pulmonary disease) (HCC)   Elevated troponin   Rhabdomyolysis   Chronic renal insufficiency, stage III (moderate)   1. Chest Pain with Moderate Risk for Cardiac Etiology: currently CP free. His trop is negative here and suspect that the slight elevation at outside hospital was due to downward trend from his recent rhabdo. He had evidence of coronary artery calcifications on chest CT in 2014. Nuclear stress test showed mild reversibility in the inferoapical wall worrisome for ischemia and normal LVF. 2D echo 02/2016 showed normal LVF. Given evidence of some degree of CAD on Chest CT with coronary calcification in 2014 and ischemia in the inferoapical wall, plan for LHC. Unable to schedule cath today. Will plan for tomorrow.  2. HTN - controlled on current regime.   3. COPD on home O2  4. Type II DM - on Metformin at home. controlled with HbA1C of 5.4.   5. Coronary artery calcifications on chest CT in 2014 - Continue ASA/statin. LDL at goal at 63. [facility]   6. Acute on chronic kidney disease secondary to recent Rhabdo - creatinine continues to trend downward - 1.54>>1.33. Will repeat  BMP today.   7. Anemia ? Etiology - Hbg now down to 8.9. Iron low at 31 but ferritin WLN. Vit B 12 normal. Folate pending.Will hemoccult stools.Possibly due to chronic disease and alcoholism  8. Dry Eyes: patient complains of left eye irritation. Feels "gritty". Will do trial of artificial tears.     LOS: 0 days    Brittainy M. Delmer IslamSimmons, PA-C 03/24/2016 11:02 AM   Attending Note:   The patient was seen and examined.  Agree with assessment and plan as noted above.  Changes made to the above note as needed.  Patient seen and independently examined with Robbie LisBrittainy Simmons, PA .   We discussed all aspects of the encounter. I agree with the assessment and plan as stated above.  I have  [facility] He has a low risk myoview His troponin was elevated in the setting of rhabdomyolysis The troponins were minimally elevated and now are normal.  Will cancel plans for cath He has no CP  Continue to get him better .  Will get PT and OT to work with him He lives alone  Anticipate DC in the next day or so   Follow up with Dr. Mayford Knifeurner    I have spent a total of 40 minutes with patient reviewing hospital  notes , telemetry, EKGs,  labs and examining patient as well as establishing an assessment and plan that was discussed with the patient. > 50% of time was spent in direct patient care.    Vesta Mixer, Montez Hageman., MD, [facility] 03/24/2016, 12:22 PM [address]  [address] Office [phone] Pager [phone]

## 2016-03-25 ENCOUNTER — Encounter (HOSPITAL_COMMUNITY)
Admission: AD | Disposition: A | Discharge status: Home / Self Care | Payer: Self-pay | Source: Other Acute Inpatient Hospital | Attending: Cardiovascular Disease

## 2016-03-25 DIAGNOSIS — J449 Chronic obstructive pulmonary disease, unspecified: Secondary | ICD-10-CM | POA: Diagnosis not present

## 2016-03-25 DIAGNOSIS — R7989 Other specified abnormal findings of blood chemistry: Secondary | ICD-10-CM | POA: Diagnosis not present

## 2016-03-25 DIAGNOSIS — R079 Chest pain, unspecified: Secondary | ICD-10-CM | POA: Diagnosis not present

## 2016-03-25 DIAGNOSIS — M6282 Rhabdomyolysis: Secondary | ICD-10-CM | POA: Diagnosis not present

## 2016-03-25 DIAGNOSIS — N189 Chronic kidney disease, unspecified: Secondary | ICD-10-CM | POA: Diagnosis not present

## 2016-03-25 LAB — BASIC METABOLIC PANEL
ANION GAP: 7 (ref 5–15)
BUN: 24 mg/dL — AB (ref 6–20)
CHLORIDE: 108 mmol/L (ref 101–111)
CO2: 26 mmol/L (ref 22–32)
Calcium: 8.5 mg/dL — ABNORMAL LOW (ref 8.9–10.3)
Creatinine, Ser: 1.16 mg/dL (ref 0.61–1.24)
GFR calc Af Amer: >60 mL/min (ref >60)
Glucose, Bld: 97 mg/dL (ref 65–99)
POTASSIUM: 4 mmol/L (ref 3.5–5.1)
SODIUM: 141 mmol/L (ref 135–145)

## 2016-03-25 LAB — CBC
HCT: 26.7 % — ABNORMAL LOW (ref 39.0–52.0)
HEMOGLOBIN: 8.5 g/dL — AB (ref 13.0–17.0)
MCH: 28.9 pg (ref 26.0–34.0)
MCHC: 31.8 g/dL (ref 30.0–36.0)
MCV: 90.8 fL (ref 78.0–100.0)
Platelets: 172 10*3/uL (ref 150–400)
RBC: 2.94 MIL/uL — AB (ref 4.22–5.81)
RDW: 15.9 % — ABNORMAL HIGH (ref 11.5–15.5)
WBC: 10 10*3/uL (ref 4.0–10.5)

## 2016-03-25 SURGERY — LEFT HEART CATH AND CORONARY ANGIOGRAPHY
Anesthesia: LOCAL

## 2016-03-25 NOTE — Progress Notes (Signed)
Primary Cardiologist:  Armanda Magicraci Turner, MD    Patient Profile: [phone] with COPD on 3L home oxygen, DM2, HTN, alcoholism, anxiety, depression, HTN, and AF with recent diagnosis of dehydration and rhabdo after being found down, who presents back to hospital with SOB and chest pain.   Subjective: Denies any CP currently. No dyspnea. His left eye feels "gritty". Feel like he has trash in his eye. Irritating.   Objective: Vital signs in last 24 hours: Temp:  [98.1 F (36.7 C)-98.5 F (36.9 C)] 98.1 F (36.7 C) (08/21 0500) Pulse Rate:  [58-70] 58 (08/21 0500) Resp:  [18] 18 (08/20 1628) BP: (130-137)/(54-63) 137/63 (08/21 0500) SpO2:  [98 %-100 %] 100 % (08/21 0822) Weight:  [139 lb 9.6 oz (63.3 kg)] 139 lb 9.6 oz (63.3 kg) (08/21 0500) Last BM Date: 03/21/16  Intake/Output from previous day: 08/20 0701 - 08/21 0700 In: 960 [P.O.:960] Out: 150 [Urine:150] Intake/Output this shift: Total I/O In: -  Out: 275 [Urine:275]  Medications Current Facility-Administered Medications  Medication Dose Route Frequency Provider Last Rate Last Dose  . 0.9 %  sodium chloride infusion  250 mL Intravenous PRN Quintella Reichertraci R Turner, MD      . 0.9% sodium chloride infusion  1 mL/kg/hr Intravenous Continuous Quintella Reichertraci R Turner, MD 64.4 mL/hr at 03/24/16 0658 1 mL/kg/hr at 03/24/16 0658  . albuterol (PROVENTIL) (2.5 MG/3ML) 0.083% nebulizer solution 3 mL  3 mL Inhalation Q6H PRN Glori Luisaniel Friedman, MD      . aspirin EC tablet 81 mg  81 mg Oral Daily Glori Luisaniel Friedman, MD   81 mg at 03/23/16 0824  . atorvastatin (LIPITOR) tablet 40 mg  40 mg Oral q1800 Glori Luisaniel Friedman, MD   40 mg at 03/23/16 1642  . enoxaparin (LOVENOX) injection 40 mg  40 mg Subcutaneous Q24H Glori Luisaniel Friedman, MD   40 mg at 03/23/16 1000  . ipratropium-albuterol (DUONEB) 0.5-2.5 (3) MG/3ML nebulizer solution 3 mL  3 mL Nebulization QID Glori Luisaniel Friedman, MD   3 mL at 03/24/16 0814  . iron polysaccharides (NIFEREX) capsule 150 mg  150 mg Oral Daily Glori Luisaniel  Friedman, MD   150 mg at 03/24/16 [id number]  . metoprolol (LOPRESSOR) tablet 50 mg  50 mg Oral BID Glori Luisaniel Friedman, MD   50 mg at 03/24/16 [id number]  . mometasone-formoterol (DULERA) 200-5 MCG/ACT inhaler 2 puff  2 puff Inhalation BID Glori Luisaniel Friedman, MD   2 puff at 03/24/16 [phone]  . pantoprazole (PROTONIX) EC tablet 40 mg  40 mg Oral Daily Glori Luisaniel Friedman, MD   40 mg at 03/24/16 [id number]  . PARoxetine (PAXIL) tablet 20 mg  20 mg Oral Daily Glori Luisaniel Friedman, MD   20 mg at 03/24/16 [id number]  . sodium chloride flush (NS) 0.9 % injection 3 mL  3 mL Intravenous Q12H Quintella Reichertraci R Turner, MD   3 mL at 03/23/16 2128  . sodium chloride flush (NS) 0.9 % injection 3 mL  3 mL Intravenous PRN Quintella Reichertraci R Turner, MD      . tamsulosin (FLOMAX) capsule 0.4 mg  0.4 mg Oral QPC supper Glori Luisaniel Friedman, MD   0.4 mg at 03/23/16 1642  . tiotropium (SPIRIVA) inhalation capsule 18 mcg  18 mcg Inhalation Daily Glori Luisaniel Friedman, MD   18 mcg at 03/24/16 [phone]    PE: General appearance: alert, cooperative and no distress Neck: no carotid bruit and no JVD Lungs: clear to auscultation bilaterally Heart: regular rate and rhythm, S1, S2 normal, no murmur, click, rub or gallop Extremities: no LEE Pulses:  2+ and symmetric Skin: warm and dry Neurologic: Grossly normal  Lab Results:   Recent Labs  03/23/16 1049  WBC 9.6  HGB 8.9*  HCT 28.0*  PLT 183   BMET  Recent Labs  03/23/16 1049  NA 139  K 3.6  CL 105  CO2 27  GLUCOSE 122*  BUN 23*  CREATININE 1.33*  CALCIUM 8.6*   PT/INR  Recent Labs  03/22/16 1338  LABPROT 13.6  INR 1.04   Cholesterol  Recent Labs  03/23/16 0403  CHOL 113   Cardiac Panel (last 3 results)  Recent Labs  03/21/16 1453  TROPONINI <0.03     Studies/Results: NST   Assessment/Plan    Principal Problem:   Chest pain with moderate risk of acute coronary syndrome Active Problems:   DM type 2 (diabetes mellitus, type 2) (HCC)   HTN (hypertension)   Depression   Chronic respiratory failure  with hypoxia (HCC)   PTSD (post-traumatic stress disorder)   COPD (chronic obstructive pulmonary disease) (HCC)   Elevated troponin   Rhabdomyolysis   Chronic renal insufficiency, stage III (moderate)   1. Chest Pain with Moderate Risk for Cardiac Etiology: currently CP free. His trop is negative here and suspect that the slight elevation at outside hospital was due to downward trend from his recent rhabdo. He had evidence of coronary artery calcifications on chest CT in 2014. Nuclear stress test showed mild reversibility in the inferoapical wall worrisome for ischemia and normal LVF. 2D echo 02/2016 showed normal LVF.  He is very frail and is a poor candidate for invasive cardiac procedures. His troponin levels were elevated only in the setting of dehydration and rhabdomyolysis.  I think our best option is to continue with medical therapy. He's currently pain-free.  Discharge him to skilled nursing facility today. He has been seen by occupational therapy who agrees with skilled nursing facility placement. The case manager is aware.  He'll follow-up with his primary medical doctor.  2. HTN - controlled on current regime.   3. COPD on home O2  4. Type II DM - on Metformin at home. controlled with HbA1C of 5.4.   5. Coronary artery calcifications on chest CT in 2014 - Continue ASA/statin. LDL at goal at 63. [facility]   6. Acute on chronic kidney disease secondary to recent Rhabdo - creatinine continues to trend downward - 1.54>>1.33. Will repeat  BMP today.      Kristeen MissPhilip Nahser, MD  03/25/2016 9:04 AM    [facility] [address],  [address] [place]  [address] Pager [phone] Phone: [phone]; Fax: [phone]

## 2016-03-25 NOTE — Discharge Summary (Signed)
Discharge Summary    Patient ID: [name],  MRN: [id number], DOB/AGE: 10/03/1940 75 y.o.  Admit date: 03/20/2016 Discharge date: 03/25/2016  Primary Care Provider: Josue Hector Primary Cardiologist: Dr. Mayford Knife  Discharge Diagnoses    Principal Problem:   Chest pain with moderate risk of acute coronary syndrome Active Problems:   DM type 2 (diabetes mellitus, type 2) (HCC)   HTN (hypertension)   Depression   Chronic respiratory failure with hypoxia (HCC)   PTSD (post-traumatic stress disorder)   COPD (chronic obstructive pulmonary disease) (HCC)   Elevated troponin   Rhabdomyolysis   Chronic renal insufficiency, stage III (moderate)   Allergies Allergies  Allergen Reactions  . Aspirin Other (See Comments)    Stomach upset    Diagnostic Studies/Procedures    FINDINGS: Perfusion: There is mild reversibility along the apical segment of the inferior wall. No large infarct. No other areas are suspicious for reversibility.  Wall Motion: Normal left ventricular wall motion. No left ventricular dilation.  Left Ventricular Ejection Fraction: 65 %  End diastolic volume 82 ml  End systolic volume 29 ml  IMPRESSION: 1. Mild reversibility involving the apical segment of the inferior wall. Findings concerning for a small area of ischemia.  2. Normal left ventricular wall motion.  3. Left ventricular ejection fraction is 65%.  4. Non invasive risk stratification*: Intermediate   History of Present Illness     63M with COPD on 3L home oxygen, DM2, HTN, alcoholism, anxiety, depression, HTN, and AF with recent diagnosis of dehydration and rhabdo. He was treated for rhabdo and discharged to a SNFn for rehab.  He presented to [facility] from rehab facility on 03/20/16 with complaint of  SOB and chest pain. There, he was diagnosed with a MI based on a peak troponin of 0.04 (ULN of assay is 0.01).  He was treated with IV heparin and metoprolol.  He was given plavix but not ASA due to a reported ASA intolerance. He was also given steroids for a COPD flair. Due to recurrent episodes of CP, he was transferred to [facility].   Hospital Course     Patient was CP free on arrival. EKG w/o ischemic abnormalities. He was admitted to telemetry. Cardiac enzymes were cycled and were negative. It was suspected that the slight elevation at outside hospital was due to downward trend from his recent rhabdo. 2D echo showed normal LVEF of 60-65% with normal wall motion and G1DD. Chart was reviewed. He had evidence of coronary artery calcifications on chest CT in 2014. Subsequently, he underwent a stress test on 8/18 which showed mild reversibility along the apical segment of the inferior wall. No large infarct. No other areas suspicious for reversibility. These images were reviewed by cardiology and felt to be low risk. It was suspected that his troponin elevation was likely residual from recent rhabdomyolysis. In addition, it was felt that he would be a very poor candidate for invasive cardiac procedures given he is very frail. Continued medical therapy was recommended and felt to be his best option.  He was continued on high dose statin and a BB. He was chest pain free day of discharge. Vital signs remained stable. Renal function continued to show signs of gradual improvement from his rhabdo. Scr day of d/c was WNL at 1.16. He worked with physical therapy prior to release. He was last seen and examined by Dr. Elease Hashimoto who felt that he was stable for discharge home. It was recommended that he  be discharged back to SNF for additional rehab. 2 week f/u has been arranged in our [place] office 04/08/16 with Joni Reining, NP.   Consultants: none     Discharge Vitals Blood pressure (!) 130/56, pulse 63, temperature 98.2 F (36.8 C), temperature source Oral, resp. rate 16, height 5\' 4"  (1.626 m), weight 140 lb (63.5 kg), SpO2 100 %.  Filed Weights   03/23/16 0500  03/24/16 0500 03/25/16 0500  Weight: 143 lb 14.4 oz (65.3 kg) 139 lb 9.6 oz (63.3 kg) 140 lb (63.5 kg)    Labs & Radiologic Studies    CBC  Recent Labs  03/23/16 1049 03/25/16 0738  WBC 9.6 10.0  NEUTROABS 6.5  --   HGB 8.9* 8.5*  HCT 28.0* 26.7*  MCV 90.0 90.8  PLT 183 172   Basic Metabolic Panel  Recent Labs  03/24/16 1508 03/25/16 0738  NA 141 141  K 4.6 4.0  CL 109 108  CO2 23 26  GLUCOSE 111* 97  BUN 22* 24*  CREATININE 1.36* 1.16  CALCIUM 8.6* 8.5*   Liver Function Tests No results for input(s): AST, ALT, ALKPHOS, BILITOT, PROT, ALBUMIN in the last 72 hours. No results for input(s): LIPASE, AMYLASE in the last 72 hours. Cardiac Enzymes No results for input(s): CKTOTAL, CKMB, CKMBINDEX, TROPONINI in the last 72 hours. BNP Invalid input(s): POCBNP D-Dimer No results for input(s): DDIMER in the last 72 hours. Hemoglobin A1C  Recent Labs  03/23/16 1049  HGBA1C 5.4   Fasting Lipid Panel  Recent Labs  03/23/16 0403  CHOL 113  HDL 32*  LDLCALC 63  TRIG 90  CHOLHDL 3.5   Thyroid Function Tests No results for input(s): TSH, T4TOTAL, T3FREE, THYROIDAB in the last 72 hours.  Invalid input(s): FREET3 _____________  X-ray Chest Pa And Lateral  Result Date: 03/21/2016 CLINICAL DATA:  Chest pain.  Shortness of breath . EXAM: CHEST  2 VIEW COMPARISON:  03/20/2016. FINDINGS: Mediastinum hilar structures normal. Heart size stable. Low lung volumes with mild bibasilar atelectasis. No pleural effusion or pneumothorax. IMPRESSION: Low lung volumes with mild bibasilar and/or infiltrates again noted. Bibasilar pneumonia cannot be excluded. Similar findings noted on prior exam . Electronically Signed   By: Maisie Fus  Register   On: 03/21/2016 07:00   Ct Head Wo Contrast  Result Date: 03/02/2016 CLINICAL DATA:  Patient found under bad at home by a friend. Nonverbal. EXAM: CT HEAD WITHOUT CONTRAST TECHNIQUE: Contiguous axial images were obtained from the base of the  skull through the vertex without intravenous contrast. COMPARISON:  02/08/2016 CT. FINDINGS: Brain: No evidence of acute infarction, hemorrhage, extra-axial collection, ventriculomegaly, or mass effect. Moderate atrophy, advanced for age. Hypoattenuation of white matter is suspected, likely chronic microvascular ischemic change. Vascular: Hyperdense RIGHT vertebral felt to represent atheromatous change rather than thrombosis. Moderate vascular calcification in the carotid siphon regions. Skull: Negative for fracture or focal lesion. Sinuses/Orbits: No acute findings. Other: Calcific pannus surrounds the odontoid. 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 Retracted LEFT TM. 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 If further investigation desired, consider elective ENT consultation. Compared with priors, similar appearance. IMPRESSION: No acute intracranial findings.  Atrophy and small vessel disease. No change from priors. Electronically Signed   By: Elsie Stain M.D.   On: 03/02/2016 16:59  US Renal  Result Date: 03/03/2016 CLINICAL DATA:  Acute renal failure and sepsis. EXAM: RENAL / URINARY TRACT ULTRASOUND COMPLETE  COMPARISON:  CT 01/06/2016 FINDINGS: Right Kidney: Length: 8.5 cm. Echogenicity within normal limits. No mass or hydronephrosis visualized. Left Kidney: Length: 9.2 cm. Echogenicity within normal limits. No mass or hydronephrosis visualized. Bladder: Not imaged. IMPRESSION: No acute urinary pathology.  No evidence of hydronephrosis. Electronically Signed   By: Jolaine Click M.D.   On: 03/03/2016 11:40  Nm Myocar Multi W/spect W/wall Motion / Ef  Result Date: 03/21/2016 CLINICAL DATA:  75 year old with chest pain. EXAM: MYOCARDIAL IMAGING WITH SPECT (REST AND PHARMACOLOGIC-STRESS) GATED LEFT VENTRICULAR WALL MOTION STUDY LEFT VENTRICULAR EJECTION FRACTION TECHNIQUE:  Standard myocardial SPECT imaging was performed after resting intravenous injection of 10 mCi Tc-53m tetrofosmin. Subsequently, intravenous infusion of Lexiscan was performed under the supervision of the Cardiology staff. At peak effect of the drug, 30 mCi Tc-29m tetrofosmin was injected intravenously and standard myocardial SPECT imaging was performed. Quantitative gated imaging was also performed to evaluate left ventricular wall motion, and estimate left ventricular ejection fraction. COMPARISON:  None. FINDINGS: Perfusion: There is mild reversibility along the apical segment of the inferior wall. No large infarct. No other areas are suspicious for reversibility. Wall Motion: Normal left ventricular wall motion. No left ventricular dilation. Left Ventricular Ejection Fraction: 65 % End diastolic volume 82 ml End systolic volume 29 ml IMPRESSION: 1. Mild reversibility involving the apical segment of the inferior wall. Findings concerning for a small area of ischemia. 2. Normal left ventricular wall motion. 3. Left ventricular ejection fraction is 65%. 4. Non invasive risk stratification*: Intermediate *2012 Appropriate Use Criteria for Coronary Revascularization Focused Update: J Am Coll Cardiol. 2012;59(9):857-881. http://content.dementiazones.com.aspx?articleid=[email or web address] Electronically Signed   By: Richarda Overlie M.D.   On: 03/21/2016 15:02   Dg Chest Port 1 View  Result Date: 03/02/2016 CLINICAL DATA:  Sepsis. EXAM: PORTABLE CHEST 1 VIEW COMPARISON:  Portable chest radiograph earlier this day at 1521 hour FINDINGS: Lower lung volumes from prior. Heart size and mediastinal contours are unchanged. Mild elevation of right hemidiaphragm likely related to lower lung volumes. Upper lobe emphysema. Bibasilar opacities likely scarring. No confluent airspace disease to suggest pneumonia. No evidence of pleural effusion. No pneumothorax. IMPRESSION: Lower lung volumes from prior exam. Upper lobe emphysema. Stable  bibasilar scarring. Electronically Signed   By: Rubye Oaks M.D.   On: 03/02/2016 19:45  Dg Chest Portable 1 View  Result Date: 03/02/2016 CLINICAL DATA:  Rhonchi.  Abnormal breath sounds. EXAM: PORTABLE CHEST 1 VIEW COMPARISON:  02/10/2016 FINDINGS: Heart is normal size. Continued increased markings at the lung bases, likely scarring, stable. No confluent opacities or effusions. No acute bony abnormality. IMPRESSION: Bibasilar scarring, stable.  No active disease. Electronically Signed   By: Charlett Nose M.D.   On: 03/02/2016 15:33  Dg Swallowing Func-speech Pathology  Result Date: 03/21/2016 Objective Swallowing Evaluation: Type of Study: MBS-Modified Barium Swallow Study Patient Details Name: BARRE AYDELOTT MRN: [id number] Date of Birth: 07-22-1941 Today's Date: 03/21/2016 Time: SLP Start Time (ACUTE ONLY): 1419-SLP Stop Time (ACUTE ONLY): 1431 SLP Time Calculation (min) (ACUTE ONLY): 12 min Past Medical History: Past Medical History: Diagnosis Date . Alcoholism (HCC)  . Anemia  . Anxiety  . Bilateral lower extremity edema 09/03/2012  Possibly secondary to calcium channel blocker. . Chronic back pain  . Chronic respiratory failure with hypoxia (HCC)  . COPD (chronic obstructive pulmonary disease) (HCC)  . Depression  . DJD (degenerative joint disease)  . Esophageal dysmotility 07/04/2011 . Essential hypertension, benign  . Myocardial infarction (HCC)  . Paroxysmal atrial fibrillation (HCC) 08/29/2012 .  Partial small bowel obstruction (HCC) 09/02/2012 . Peripheral neuropathy (HCC)  . Pneumonia  . PTSD (post-traumatic stress disorder)  . Type II or unspecified type diabetes mellitus without mention of complication, not stated as uncontrolled  Past Surgical History: Past Surgical History: Procedure Laterality Date . APPENDECTOMY   . HERNIA REPAIR    X 3 . PROSTATE SURGERY   . Spinal surgeries    X 4 HPI: 75 y.o.Caucasian male, [place] [employer] [place] with medical history significant for PTSD, alcoholism,  depression, anxiety, COPD with chronic hypoxic respiratory failure, and hypertension who was admitted 03/02/16-8/4/17after being found down at his home by a friend. During this admission he was recommended to have Dys 1 diet and honey thick liquids. He says that he frequently gets PNA - often multiple times per year. Subjective: pt says he has trouble with his esophagus Assessment / Plan / Recommendation CHL IP CLINICAL IMPRESSIONS 03/21/2016 Therapy Diagnosis WFL;Suspected primary esophageal dysphagia Clinical Impression Pt's oropharyngeal swallow is within gross functional limits given age and lack of dentition. Frequent coughing noted during study was not associated with any airway compromise. He appears to be more at risk for post-prandial aspiration given what appeared to be reduced clearance of the esophagus, although MD was not present to confirm. Recommend Dys 2 diet given lack of dentition, consistency with home diet, and esophageal concerns. He may have thin liquids as tolerated. SLP to f/u for tolerance and reinforcement of aspiration and esophageal precautions. Impact on safety and function Moderate aspiration risk   CHL IP TREATMENT RECOMMENDATION 03/21/2016 Treatment Recommendations Therapy as outlined in treatment plan below   Prognosis 03/21/2016 Prognosis for Safe Diet Advancement Fair Barriers to Reach Goals Other (Comment) Barriers/Prognosis Comment -- CHL IP DIET RECOMMENDATION 03/21/2016 SLP Diet Recommendations Dysphagia 2 (Fine chop) solids;Thin liquid Liquid Administration via Cup;Straw Medication Administration Whole meds with puree Compensations Minimize environmental distractions;Slow rate;Small sips/bites;Follow solids with liquid Postural Changes Remain semi-upright after after feeds/meals (Comment);Seated upright at 90 degrees   CHL IP OTHER RECOMMENDATIONS 03/21/2016 Recommended Consults Consider esophageal assessment Oral Care Recommendations Oral care BID Other Recommendations --   CHL IP  FOLLOW UP RECOMMENDATIONS 03/21/2016 Follow up Recommendations (No Data)   CHL IP FREQUENCY AND DURATION 03/21/2016 Speech Therapy Frequency (ACUTE ONLY) min 2x/week Treatment Duration 2 weeks      CHL IP ORAL PHASE 03/21/2016 Oral Phase WFL Oral - Pudding Teaspoon -- Oral - Pudding Cup -- Oral - Honey Teaspoon -- Oral - Honey Cup -- Oral - Nectar Teaspoon -- Oral - Nectar Cup -- Oral - Nectar Straw -- Oral - Thin Teaspoon -- Oral - Thin Cup -- Oral - Thin Straw -- Oral - Puree -- Oral - Mech Soft -- Oral - Regular -- Oral - Multi-Consistency -- Oral - Pill -- Oral Phase - Comment --  CHL IP PHARYNGEAL PHASE 03/21/2016 Pharyngeal Phase WFL Pharyngeal- Pudding Teaspoon -- Pharyngeal -- Pharyngeal- Pudding Cup -- Pharyngeal -- Pharyngeal- Honey Teaspoon -- Pharyngeal -- Pharyngeal- Honey Cup -- Pharyngeal -- Pharyngeal- Nectar Teaspoon -- Pharyngeal -- Pharyngeal- Nectar Cup -- Pharyngeal -- Pharyngeal- Nectar Straw -- Pharyngeal -- Pharyngeal- Thin Teaspoon -- Pharyngeal -- Pharyngeal- Thin Cup -- Pharyngeal -- Pharyngeal- Thin Straw -- Pharyngeal -- Pharyngeal- Puree -- Pharyngeal -- Pharyngeal- Mechanical Soft -- Pharyngeal -- Pharyngeal- Regular -- Pharyngeal -- Pharyngeal- Multi-consistency -- Pharyngeal -- Pharyngeal- Pill -- Pharyngeal -- Pharyngeal Comment --  CHL IP CERVICAL ESOPHAGEAL PHASE 03/21/2016 Cervical Esophageal Phase WFL Pudding Teaspoon -- Pudding Cup -- Honey  Teaspoon -- Honey Cup -- Nectar Teaspoon -- Nectar Cup -- Nectar Straw -- Thin Teaspoon -- Thin Cup -- Thin Straw -- Puree -- Mechanical Soft -- Regular -- Multi-consistency -- Pill -- Cervical Esophageal Comment -- No flowsheet data found. Maxcine Hamaiewonsky, Laura 03/21/2016, 2:53 PM  Maxcine HamLaura Paiewonsky, M.A. CCC-SLP [phone]             Disposition   Pt is being discharged home today in good condition.  Follow-up Plans & Appointments    Follow-up Information    Joni ReiningKathryn Lawrence, NP Follow up on 04/08/2016.   Specialties:  Nurse  Practitioner, Radiology, Cardiology Why:  2:30 PM Cardiology follow-up appointment  Contact information: [address] [place] [id number] [phone]          Discharge Instructions    Diet - low sodium heart healthy    Complete by:  As directed   Increase activity slowly    Complete by:  As directed      Discharge Medications   Current Discharge Medication List    CONTINUE these medications which have NOT CHANGED   Details  acetaminophen (TYLENOL) 325 MG tablet Take 650 mg by mouth every 4 (four) hours as needed (pain).    albuterol (PROVENTIL HFA;VENTOLIN HFA) 108 (90 BASE) MCG/ACT inhaler Inhale 2 puffs into the lungs every 6 (six) hours as needed for wheezing.    atorvastatin (LIPITOR) 40 MG tablet Take 40 mg by mouth every evening.     budesonide-formoterol (SYMBICORT) 160-4.5 MCG/ACT inhaler Inhale 2 puffs into the lungs every evening.     ipratropium-albuterol (DUONEB) 0.5-2.5 (3) MG/3ML SOLN Take 3 mLs by nebulization 4 (four) times daily.    iron polysaccharides (NIFEREX) 150 MG capsule Take 150 mg by mouth daily.    ketoconazole (NIZORAL) 2 % shampoo Apply 1 application topically 2 (two) times a week. Tuesday and Friday - for dry scalp    magnesium hydroxide (MILK OF MAGNESIA) 400 MG/5ML suspension Take 30 mLs by mouth daily as needed for mild constipation.    metoprolol (LOPRESSOR) 50 MG tablet Take 50 mg by mouth 2 (two) times daily.    Multiple Vitamin (MULTIVITAMIN WITH MINERALS) TABS tablet Take 1 tablet by mouth daily.    !! Nutritional Supplements (PROMOD) LIQD Take 60 mLs by mouth every evening.    !! NUTRITIONAL SUPPLEMENTS PO Take 60 mLs by mouth 3 (three) times daily. MedPass    omeprazole (PRILOSEC) 20 MG capsule Take 20 mg by mouth every evening.    OXYGEN Inhale 2 L into the lungs continuous.    PARoxetine (PAXIL) 20 MG tablet Take 20 mg by mouth daily.     Skin Protectants, Misc. (SOOTHE & COOL FREE MEDSEPTIC) OINT Apply 1 application  topically See admin instructions. Apply to buttocks with each toileting episode    Tamsulosin HCl (FLOMAX) 0.4 MG CAPS Take 1 capsule (0.4 mg total) by mouth daily after supper. Qty: 30 capsule, Refills: 0    tiotropium (SPIRIVA) 18 MCG inhalation capsule Place 18 mcg into inhaler and inhale at bedtime.     vitamin C (ASCORBIC ACID) 500 MG tablet Take 500 mg by mouth 2 (two) times daily.     !! - Potential duplicate medications found. Please discuss with provider.    STOP taking these medications     aspirin EC 81 MG EC tablet      pantoprazole (PROTONIX) 40 MG tablet            Outstanding Labs/Studies   none  Duration of Discharge Encounter   Greater than 30 minutes including physician time.  Signed, Robbie Lis PA-C 03/25/2016, 10:27 AM   Attending Note:   The patient was seen and examined.  Agree with assessment and plan as noted above.  Changes made to the above note as needed.  Patient seen and independently examined with [place] Simmons,PA .   We discussed all aspects of the encounter. I agree with the assessment and plan as stated above.  Pt is a 75 yo man with hx of Alcoholism,   Was admitted with rhabdomyolysis and dehydration.  Troponin levels were very minimally elevated.  He is stable No CP. Not a candidate for invasive procedures  Stable for DC to [facility]    I have spent a total of 40 minutes with patient reviewing hospital  notes , telemetry, EKGs, labs and examining patient as well as establishing an assessment and plan that was discussed with the patient. > 50% of time was spent in direct patient care.    Vesta Mixer, Montez Hageman., MD, [facility] 03/27/2016, 6:06 PM [address]  [address] Office [phone] Pager [phone]

## 2016-03-25 NOTE — Progress Notes (Signed)
Speech Language Pathology Treatment: Dysphagia  Patient Details Name: [name] MRN: [id number] DOB: 07/27/1941 Today's Date: 03/25/2016 Time: 1012-1029 SLP Time Calculation (min) (ACUTE ONLY): 17 min  Assessment / Plan / Recommendation Clinical Impression  Pt had meal tray in room upon SLP arrival and consumed bites of foods from the meal tray (eggs, sausage, hashbrowns). Pt showed min signs of oral pocketing with solid foods. SLP offered moderate cueing for lingual sweep and to follow solids with liquids when eating. Observed pt have immediate cough after a sip of thin liquids; however, pt consumed a large sip during trial. During other trials of thins no s/s of aspiration were observed. Recommend continuation of Dys 2 diet and thin liquids. Will f/u if not D/C.   HPI HPI: 75 y.o.Caucasian male, [place] [employer] [place] with medical history significant for PTSD, alcoholism, depression, anxiety, COPD with chronic hypoxic respiratory failure, and hypertension who was admitted 03/02/16-8/4/17after being found down at his home by a friend. During this admission he was recommended to have Dys 1 diet and honey thick liquids. He says that he frequently gets PNA - often multiple times per year.      SLP Plan  Continue with current plan of care     Recommendations  Diet recommendations: Dysphagia 2 (fine chop);Thin liquid Liquids provided via: Straw Medication Administration: Whole meds with puree Supervision: Patient able to self feed Compensations: Slow rate;Small sips/bites;Lingual sweep for clearance of pocketing;Follow solids with liquid Postural Changes and/or Swallow Maneuvers: Seated upright 90 degrees;Upright 30-60 min after meal             Oral Care Recommendations: Oral care BID Follow up Recommendations: Skilled Nursing facility Plan: Continue with current plan of care     GO              Tollie EthHaleigh Ragan Jordi Lacko, Student SLP  Caryl NeverHaleigh R Jacarra Bobak 03/25/2016, 10:48 AM

## 2016-03-25 NOTE — Progress Notes (Signed)
SW [facility] with [facility]. He states that facility has all documentation at this time and pt is welcomed to come.  SW will call transportation. SW made nurse aware. SW made son aware who is accepting and states that he has no questions for SW.  Crista CurbBrittney Rylynn Kobs, Social Work [phone]

## 2016-04-01 NOTE — Progress Notes (Signed)
SLP Note (late entry):   03/21/16 1100  SLP G-Codes **NOT FOR INPATIENT CLASS**  Functional Assessment Tool Used skilled clinical judgment  Functional Limitations Swallowing  Swallow Current Status (Z6109(G8996) CJ  Swallow Goal Status (U0454(G8997) CI  SLP Evaluations  $ SLP Speech Visit 1 Procedure  SLP Evaluations  $BSS Swallow 1 Procedure  $Swallowing Treatment 1 Procedure   Maxcine HamLaura Paiewonsky, M.A. CCC-SLP [phone]

## 2016-04-08 ENCOUNTER — Encounter: Payer: Self-pay | Admitting: Adult Health

## 2016-04-08 NOTE — Progress Notes (Signed)
   03/24/16 1620  OT G-codes **NOT FOR INPATIENT CLASS**  Functional Assessment Tool Used (clincal observation and chart review)  Functional Limitation Self care  Self Care Goal Status (Z6109(G8988) CM  Self Care Discharge Status [phone](G8989) CK   late entry Ignacia PalmaCathy Alexismarie Flaim, OTR/L [phone] 04/08/2016

## 2016-04-10 ENCOUNTER — Encounter: Payer: Self-pay | Admitting: Adult Health

## 2016-04-10 ENCOUNTER — Ambulatory Visit (INDEPENDENT_AMBULATORY_CARE_PROVIDER_SITE_OTHER): Payer: Medicare Other | Admitting: Adult Health

## 2016-04-10 VITALS — BP 130/56 | HR 73 | Ht 64.5 in | Wt 135.0 lb

## 2016-04-10 DIAGNOSIS — F0391 Unspecified dementia with behavioral disturbance: Secondary | ICD-10-CM

## 2016-04-10 DIAGNOSIS — R0789 Other chest pain: Secondary | ICD-10-CM

## 2016-04-10 DIAGNOSIS — I10 Essential (primary) hypertension: Secondary | ICD-10-CM | POA: Diagnosis not present

## 2016-04-10 NOTE — Patient Instructions (Signed)
Your physician wants you to follow-up in: 6 Months with Dr. Branch. You will receive a reminder letter in the mail two months in advance. If you don't receive a letter, please call our office to schedule the follow-up appointment.  Your physician recommends that you continue on your current medications as directed. Please refer to the Current Medication list given to you today.  If you need a refill on your cardiac medications before your next appointment, please call your pharmacy.  Thank you for choosing [facility]!   

## 2016-04-10 NOTE — Progress Notes (Signed)
Cardiology Office Note   Date:  04/10/2016   ID:  [name], DOB May 31, 1941, MRN [id number]  PCP:  Toma Deiters, MD  Cardiologist: Janifer Adie, NP   Chief Complaint  Patient presents with  . Chest Pain      History of Present Illness: [name] is a 75 y.o. male who presents for posthospitalization follow-up after admission for chest pain with moderate risk of acute coronary artery syndrome, with history of hypertension, diabetes, PTSD, COPD, and chronic renal insufficiency stage III along with chronic respiratory failure. The patient was admitted 03/24/2016.  The patient underwent myocardial perfusion study which revealed mild reversibility involving the apical segment of the inferior wall findings concerning for small area of ischemia with normal left ventricular systolic function. He was found to be a low risk and suspected that his troponin elevation was likely residual from recent rhabdo my lysis. It was felt that he would be very poor candidate for invasive cardiac procedures given that he is very frail. He was continued on medical therapy. This included high-dose statin and beta blocker. He was asymptomatic on discharge and he is here for post hospitalization follow-up.  He is a resident of the [facility] in [place]. He has a history of dementia. The patient has multiple complaints and confused speech patterns. He is accompanied by skilled nursing facility worker. She tells me he is back to baseline concerning his dementia.  Past Medical History:  Diagnosis Date  . Alcoholism (HCC)   . Anemia   . Anxiety   . Bilateral lower extremity edema 09/03/2012   Possibly secondary to calcium channel blocker.  . Chronic back pain   . Chronic respiratory failure with hypoxia (HCC)   . COPD (chronic obstructive pulmonary disease) (HCC)   . Depression   . DJD (degenerative joint disease)   . Esophageal dysmotility 07/04/2011  . Essential hypertension, benign   .  Myocardial infarction (HCC)   . Paroxysmal atrial fibrillation (HCC) 08/29/2012  . Partial small bowel obstruction (HCC) 09/02/2012  . Peripheral neuropathy (HCC)   . Pneumonia   . PTSD (post-traumatic stress disorder)   . Type II or unspecified type diabetes mellitus without mention of complication, not stated as uncontrolled     Past Surgical History:  Procedure Laterality Date  . APPENDECTOMY    . HERNIA REPAIR     X 3  . PROSTATE SURGERY    . Spinal surgeries     X 4     Current Outpatient Prescriptions  Medication Sig Dispense Refill  . acetaminophen (TYLENOL) 325 MG tablet Take 650 mg by mouth every 4 (four) hours as needed (pain).    [name] albuterol (PROVENTIL HFA;VENTOLIN HFA) 108 (90 BASE) MCG/ACT inhaler Inhale 2 puffs into the lungs every 6 (six) hours as needed for wheezing.    [name] atorvastatin (LIPITOR) 40 MG tablet Take 40 mg by mouth every evening.     . budesonide-formoterol (SYMBICORT) 160-4.5 MCG/ACT inhaler Inhale 2 puffs into the lungs every evening.     . cephALEXin (KEFLEX) 500 MG capsule Take 500 mg by mouth 2 (two) times daily.    [name] ipratropium-albuterol (DUONEB) 0.5-2.5 (3) MG/3ML SOLN Take 3 mLs by nebulization 4 (four) times daily.    . iron polysaccharides (NIFEREX) 150 MG capsule Take 150 mg by mouth daily.    [name] ketoconazole (NIZORAL) 2 % shampoo Apply 1 application topically 2 (two) times a week. Tuesday and Friday - for dry scalp    .  magnesium hydroxide (MILK OF MAGNESIA) 400 MG/5ML suspension Take 30 mLs by mouth daily as needed for mild constipation.    . metoprolol (LOPRESSOR) 50 MG tablet Take 50 mg by mouth 2 (two) times daily.    . mirtazapine (REMERON) 15 MG tablet Take 15 mg by mouth at bedtime.    . Multiple Vitamin (MULTIVITAMIN WITH MINERALS) TABS tablet Take 1 tablet by mouth daily.    . Nutritional Supplements (PROMOD) LIQD Take 60 mLs by mouth every evening.    [name]. NUTRITIONAL SUPPLEMENTS PO Take 60 mLs by mouth 3 (three) times daily. [employer]     . omeprazole (PRILOSEC) 20 MG capsule Take 20 mg by mouth every evening.    . OXYGEN Inhale 2 L into the lungs continuous.    [name]. PARoxetine (PAXIL) 20 MG tablet Take 20 mg by mouth daily.     . Skin Protectants, Misc. (SOOTHE & COOL FREE MEDSEPTIC) OINT Apply 1 application topically See admin instructions. Apply to buttocks with each toileting episode    . Tamsulosin HCl (FLOMAX) 0.4 MG CAPS Take 1 capsule (0.4 mg total) by mouth daily after supper. 30 capsule 0  . tiotropium (SPIRIVA) 18 MCG inhalation capsule Place 18 mcg into inhaler and inhale at bedtime.     . vitamin C (ASCORBIC ACID) 500 MG tablet Take 500 mg by mouth 2 (two) times daily.     No current facility-administered medications for this visit.     Allergies:   Aspirin    Social History:  The patient  reports that he has been smoking Cigarettes.  He has a 27.50 pack-year smoking history. He has never used smokeless tobacco. He reports that he does not drink alcohol or use drugs.   Family History:  The patient's family history includes Lung disease in his mother; Prostate cancer in his father.    ROS: All other systems are reviewed and negative. Unless otherwise mentioned in H&P    PHYSICAL EXAM: VS:  BP (!) 130/56   Pulse 73   Ht 5' 4.5" (1.638 m)   Wt 135 lb (61.2 kg)   SpO2 97%   BMI 22.81 kg/m  , BMI Body mass index is 22.81 kg/m. GEN: Well nourished, well developed, in no acute distress Sitting in a wheelchair. HEENT: normal  Neck: no JVD, carotid bruits, or masses Cardiac: RRR; no murmurs, rubs, or gallops, very mild dependent edema. Respiratory:  Bilateral inspiratory expiratory crackles, wearing oxygen via nasal cannula GI: soft, nontender, nondistended, + BS MS: no deformity or atrophy  Skin: warm and dry, no rash Neuro:  Strength is diminished, and sensation are intact Psych: Confused, cantankerous  Recent Labs: 03/21/2016: ALT 42; Magnesium 1.7; TSH 1.736 03/25/2016: BUN 24; Creatinine, Ser 1.16;  Hemoglobin 8.5; Platelets 172; Potassium 4.0; Sodium 141    Lipid Panel    Component Value Date/Time   CHOL 113 03/23/2016 0403   TRIG 90 03/23/2016 0403   HDL 32 (L) 03/23/2016 0403   CHOLHDL 3.5 03/23/2016 0403   VLDL 18 03/23/2016 0403   LDLCALC 63 03/23/2016 0403      Wt Readings from Last 3 Encounters:  04/10/16 135 lb (61.2 kg)  03/25/16 140 lb (63.5 kg)  03/07/16 139 lb 8 oz (63.3 kg)    ASSESSMENT AND PLAN:  1.  Chronic chest pain: Continues to complain of this when asked. Recent hospitalization did not reveal ischemic changes during cardiac MPI. We'll continue him on his current medication regimen without further invasive testing due to  multiple comorbidities. Medical management only. Continue statin, beta blocker.  2. Hypertension: Blood pressure is currently well-controlled. He much better than previous evaluation on admission. He is back to baseline. No changes in medication at this time.     Current medicines are reviewed at length with the patient today.    Labs/ tests ordered today include: No orders of the defined types were placed in this encounter.    Disposition:   FU with 6 months in [place] office.  Signed, Joni Reining, NP  04/10/2016 4:26 PM    [facility] [address] [place], [place] [address] Phone: [phone]; Fax: [phone]

## 2016-04-10 NOTE — Progress Notes (Signed)
Name: [name]    DOB: 05/11/1941  Age: 75 y.o.  MR#: [id number]       PCP:  Toma DeitersXAJE A HASANAJ, MD      Insurance: Payor: [employer] MEDICARE / [facility] / Product Type: *No Product type* /   CC:   No chief complaint on file.   VS Vitals:   04/10/16 1526  Pulse: 73  SpO2: 97%  Weight: 135 lb (61.2 kg)  Height: 5' 4.5" (1.638 m)    Weights Current Weight  04/10/16 135 lb (61.2 kg)  03/25/16 140 lb (63.5 kg)  03/07/16 139 lb 8 oz (63.3 kg)    Blood Pressure  BP Readings from Last 3 Encounters:  03/25/16 (!) 130/56  03/07/16 (!) 163/59  05/08/14 (!) 137/110     Admit date:  (Not on file) Last encounter with RMR:  Visit date not found   Allergy Aspirin  Current Outpatient Prescriptions  Medication Sig Dispense Refill  . acetaminophen (TYLENOL) 325 MG tablet Take 650 mg by mouth every 4 (four) hours as needed (pain).    [name]. albuterol (PROVENTIL HFA;VENTOLIN HFA) 108 (90 BASE) MCG/ACT inhaler Inhale 2 puffs into the lungs every 6 (six) hours as needed for wheezing.    [name]. atorvastatin (LIPITOR) 40 MG tablet Take 40 mg by mouth every evening.     . budesonide-formoterol (SYMBICORT) 160-4.5 MCG/ACT inhaler Inhale 2 puffs into the lungs every evening.     . cephALEXin (KEFLEX) 500 MG capsule Take 500 mg by mouth 2 (two) times daily.    [name]. ipratropium-albuterol (DUONEB) 0.5-2.5 (3) MG/3ML SOLN Take 3 mLs by nebulization 4 (four) times daily.    . iron polysaccharides (NIFEREX) 150 MG capsule Take 150 mg by mouth daily.    [name]. ketoconazole (NIZORAL) 2 % shampoo Apply 1 application topically 2 (two) times a week. Tuesday and Friday - for dry scalp    . magnesium hydroxide (MILK OF MAGNESIA) 400 MG/5ML suspension Take 30 mLs by mouth daily as needed for mild constipation.    . metoprolol (LOPRESSOR) 50 MG tablet Take 50 mg by mouth 2 (two) times daily.    . mirtazapine (REMERON) 15 MG tablet Take 15 mg by mouth at bedtime.    . Multiple Vitamin (MULTIVITAMIN WITH MINERALS)  TABS tablet Take 1 tablet by mouth daily.    . Nutritional Supplements (PROMOD) LIQD Take 60 mLs by mouth every evening.    [name]. NUTRITIONAL SUPPLEMENTS PO Take 60 mLs by mouth 3 (three) times daily. Med[employer]    . omeprazole (PRILOSEC) 20 MG capsule Take 20 mg by mouth every evening.    . OXYGEN Inhale 2 L into the lungs continuous.    [name]. PARoxetine (PAXIL) 20 MG tablet Take 20 mg by mouth daily.     . Skin Protectants, Misc. (SOOTHE & COOL FREE MEDSEPTIC) OINT Apply 1 application topically See admin instructions. Apply to buttocks with each toileting episode    . Tamsulosin HCl (FLOMAX) 0.4 MG CAPS Take 1 capsule (0.4 mg total) by mouth daily after supper. 30 capsule 0  . tiotropium (SPIRIVA) 18 MCG inhalation capsule Place 18 mcg into inhaler and inhale at bedtime.     . vitamin C (ASCORBIC ACID) 500 MG tablet Take 500 mg by mouth 2 (two) times daily.     No current facility-administered medications for this visit.     Discontinued Meds:   There are no discontinued medications.  Patient Active Problem List   Diagnosis Date Noted  .  Chronic renal insufficiency, stage III (moderate) 03/21/2016  . Altered mental status 03/02/2016  . Pressure ulcer 03/02/2016  . AKI (acute kidney injury) (HCC) 03/02/2016  . Hypernatremia 03/02/2016  . Elevated troponin 03/02/2016  . Dehydration 03/02/2016  . Rhabdomyolysis 03/02/2016  . Acute encephalopathy 03/02/2016  . Traumatic rhabdomyolysis (HCC)   . COPD (chronic obstructive pulmonary disease) (HCC) 10/08/2013  . COPD exacerbation (HCC) 10/08/2013  . Guaiac positive stools 08/18/2013  . Chest pain with moderate risk of acute coronary syndrome 01/31/2013  . PTSD (post-traumatic stress disorder) 01/31/2013  . Leukocytosis 01/31/2013  . Chest pain at rest 01/24/2013  . Paroxysmal atrial fibrillation (HCC) 09/04/2012  . Bilateral lower extremity edema 09/03/2012  . Thrombocytopenia (HCC) 09/03/2012  . Nausea & vomiting 09/02/2012  . Partial small  bowel obstruction (HCC) 09/02/2012  . Cor pulmonale (HCC) 08/29/2012  . Acute-on-chronic respiratory failure (HCC) 08/29/2012  . Physical deconditioning 08/18/2012  . Chronic respiratory failure with hypoxia (HCC) 08/17/2012  . Esophageal dysmotility 07/04/2011  . Anemia 07/02/2011  . Hyponatremia 07/02/2011  . Depression 07/01/2011  . Anxiety 07/01/2011  . Smoker 07/01/2011  . COPD GOLD II with reversibility 06/30/2011  . DM type 2 (diabetes mellitus, type 2) (HCC) 06/30/2011  . HTN (hypertension) 06/30/2011  . DDD (degenerative disc disease) 06/30/2011  . BPH (benign prostatic hyperplasia) 06/30/2011  . Dysphagia 06/30/2011    LABS    Component Value Date/Time   NA 141 03/25/2016 0738   NA 141 03/24/2016 1508   NA 139 03/23/2016 1049   K 4.0 03/25/2016 0738   K 4.6 03/24/2016 1508   K 3.6 03/23/2016 1049   CL 108 03/25/2016 0738   CL 109 03/24/2016 1508   CL 105 03/23/2016 1049   CO2 26 03/25/2016 0738   CO2 23 03/24/2016 1508   CO2 27 03/23/2016 1049   GLUCOSE 97 03/25/2016 0738   GLUCOSE 111 (H) 03/24/2016 1508   GLUCOSE 122 (H) 03/23/2016 1049   BUN 24 (H) 03/25/2016 0738   BUN 22 (H) 03/24/2016 1508   BUN 23 (H) 03/23/2016 1049   CREATININE 1.16 03/25/2016 0738   CREATININE 1.36 (H) 03/24/2016 1508   CREATININE 1.33 (H) 03/23/2016 1049   CALCIUM 8.5 (L) 03/25/2016 0738   CALCIUM 8.6 (L) 03/24/2016 1508   CALCIUM 8.6 (L) 03/23/2016 1049   GFRNONAA >60 03/25/2016 0738   GFRNONAA 50 (L) 03/24/2016 1508   GFRNONAA 51 (L) 03/23/2016 1049   GFRAA >60 03/25/2016 0738   GFRAA 58 (L) 03/24/2016 1508   GFRAA 60 (L) 03/23/2016 1049   CMP     Component Value Date/Time   NA 141 03/25/2016 0738   K 4.0 03/25/2016 0738   CL 108 03/25/2016 0738   CO2 26 03/25/2016 0738   GLUCOSE 97 03/25/2016 0738   BUN 24 (H) 03/25/2016 0738   CREATININE 1.16 03/25/2016 0738   CALCIUM 8.5 (L) 03/25/2016 0738   PROT 5.7 (L) 03/21/2016 0050   ALBUMIN 2.9 (L) 03/21/2016 0050    AST 29 03/21/2016 0050   ALT 42 03/21/2016 0050   ALKPHOS 79 03/21/2016 0050   BILITOT 0.3 03/21/2016 0050   GFRNONAA >60 03/25/2016 0738   GFRAA >60 03/25/2016 0738       Component Value Date/Time   WBC 10.0 03/25/2016 0738   WBC 9.6 03/23/2016 1049   WBC 9.1 03/21/2016 0050   HGB 8.5 (L) 03/25/2016 0738   HGB 8.9 (L) 03/23/2016 1049   HGB 8.3 (L) 03/21/2016 0050   HCT 26.7 (  L) 03/25/2016 0738   HCT 28.0 (L) 03/23/2016 1049   HCT 26.9 (L) 03/22/2016 1338   HCT 25.9 (L) 03/21/2016 0050   HCT 37.1 (L) 03/02/2016 2122   MCV 90.8 03/25/2016 0738   MCV 90.0 03/23/2016 1049   MCV 88.7 03/21/2016 0050    Lipid Panel     Component Value Date/Time   CHOL 113 03/23/2016 0403   TRIG 90 03/23/2016 0403   HDL 32 (L) 03/23/2016 0403   CHOLHDL 3.5 03/23/2016 0403   VLDL 18 03/23/2016 0403   LDLCALC 63 03/23/2016 0403    ABG    Component Value Date/Time   PHART 7.422 08/03/2008 1920   PCO2ART 39.6 08/03/2008 1920   PO2ART 144.0 (H) 08/03/2008 1920   HCO3 25.4 (H) 08/03/2008 1920   TCO2 22.0 08/03/2008 1920   ACIDBASEDEF 6.9 (H) 06/08/2007 2300   O2SAT 99.0 08/03/2008 1920     Lab Results  Component Value Date   TSH 1.736 03/21/2016   BNP (last 3 results) No results for input(s): BNP in the last 8760 hours.  ProBNP (last 3 results) No results for input(s): PROBNP in the last 8760 hours.  Cardiac Panel (last 3 results) No results for input(s): CKTOTAL, CKMB, TROPONINI, RELINDX in the last 72 hours.  Iron/TIBC/Ferritin/ %Sat    Component Value Date/Time   IRON 31 (L) 03/22/2016 1338   TIBC 239 (L) 03/22/2016 1338   FERRITIN 286 03/23/2016 0403   IRONPCTSAT 13 (L) 03/22/2016 1338     EKG Orders placed or performed during the hospital encounter of 03/02/16  . EKG 12-Lead  . EKG 12-Lead  . EKG 12-Lead  . EKG 12-Lead  . EKG     Prior Assessment and Plan Problem List as of 04/10/2016 Reviewed: 03/21/2016  8:54 AM by Corine Shelter, PA-C     Cardiovascular and  Mediastinum   HTN (hypertension)   Last Assessment & Plan 07/08/2013 Office Visit Written 07/10/2013  6:55 AM by Nyoka Cowden, MD    D/c acei 05/10/13 (not taking regularly anyway)  Adequate control off acei, reviewed  Need to avoid ACEi given difficulty interpreting symptoms when acei on board.      Cor pulmonale (HCC)   Paroxysmal atrial fibrillation (HCC)     Respiratory   COPD GOLD II with reversibility   Last Assessment & Plan 07/08/2013 Office Visit Written 07/10/2013  6:51 AM by Nyoka Cowden, MD    - hfa 75% p coaching 05/10/13  And 07/08/13  - PFT's 07/08/2013 FEV1  1.17 (52%) which is 29% better p B2 and DLCO 37 corrects to 75  DDX of  difficult airways managment all start with A and  include Adherence, Ace Inhibitors, Acid Reflux, Active Sinus Disease, Alpha 1 Antitripsin deficiency, Anxiety masquerading as Airways dz,  ABPA,  allergy(esp in young), Aspiration (esp in elderly), Adverse effects of DPI,  Active smokers, plus two Bs  = Bronchiectasis and Beta blocker use..and one C= CHF  Adherence is always the initial "prime suspect" and is a multilayered concern that requires a "trust but verify" approach in every patient - starting with knowing how to use medications, especially inhalers, correctly, keeping up with refills and understanding the fundamental difference between maintenance and prns vs those medications only taken for a very short course and then stopped and not refilled.  -The proper method of use, as well as anticipated side effects, of a metered-dose inhaler are discussed and demonstrated to the patient. Improved effectiveness after extensive coaching during  this visit to a level of approximately  75% so continue symbiocort 160  ? acei > off  Active smoking > discussed separately       Chronic respiratory failure with hypoxia [facility])   Last Assessment & Plan 07/08/2013 Office Visit Written 07/10/2013  6:52 AM by Nyoka Cowden, MD    - on 02 2lpm 24/7 since 2011 -  07/08/2013  Walked 4lpm 2 laps @ 185 ft each stopped due to 96% and stopped due to fatigue  Rx as of 07/08/13 = 2 lpm at rest/ sleeping and 4lpm with ex      Acute-on-chronic respiratory failure (HCC)   COPD (chronic obstructive pulmonary disease) (HCC)   COPD exacerbation (HCC)     Digestive   Esophageal dysmotility   Dysphagia   Nausea & vomiting   Partial small bowel obstruction (HCC)     Endocrine   DM type 2 (diabetes mellitus, type 2) (HCC)     Nervous and Auditory   Acute encephalopathy     Musculoskeletal and Integument   DDD (degenerative disc disease)   Pressure ulcer   Rhabdomyolysis   Traumatic rhabdomyolysis (HCC)     Genitourinary   BPH (benign prostatic hyperplasia)   AKI (acute kidney injury) (HCC)   Chronic renal insufficiency, stage III (moderate)     Other   Depression   Anxiety   Smoker   Last Assessment & Plan 07/08/2013 Office Visit Written 07/10/2013  6:51 AM by Nyoka Cowden, MD    > 3 min I reviewed the Flethcher curve with patient that basically indicates  if you quit smoking when your best day FEV1 is still well preserved (as his is) it is highly unlikely you will progress to severe disease and informed the patient there was no medication on the market that has proven to change the curve or the likelihood of progression.  Therefore stopping smoking and maintaining abstinence is the most important aspect of care, not choice of inhalers or for that matter, doctors.        Anemia   Hyponatremia   Physical deconditioning   Bilateral lower extremity edema   Thrombocytopenia (HCC)   Chest pain at rest   Chest pain with moderate risk of acute coronary syndrome   PTSD (post-traumatic stress disorder)   Leukocytosis   Guaiac positive stools   Altered mental status   Hypernatremia   Elevated troponin   Dehydration       Imaging: X-ray Chest Pa And Lateral  Result Date: 03/21/2016 CLINICAL DATA:  Chest pain.  Shortness of breath . EXAM: CHEST   2 VIEW COMPARISON:  03/20/2016. FINDINGS: Mediastinum hilar structures normal. Heart size stable. Low lung volumes with mild bibasilar atelectasis. No pleural effusion or pneumothorax. IMPRESSION: Low lung volumes with mild bibasilar and/or infiltrates again noted. Bibasilar pneumonia cannot be excluded. Similar findings noted on prior exam . Electronically Signed   By: Maisie Fus  Register   On: 03/21/2016 07:00   Nm Myocar Multi W/spect Izetta Dakin Motion / Ef  Result Date: 03/21/2016 CLINICAL DATA:  75 year old with chest pain. EXAM: MYOCARDIAL IMAGING WITH SPECT (REST AND PHARMACOLOGIC-STRESS) GATED LEFT VENTRICULAR WALL MOTION STUDY LEFT VENTRICULAR EJECTION FRACTION TECHNIQUE: Standard myocardial SPECT imaging was performed after resting intravenous injection of 10 mCi Tc-70m tetrofosmin. Subsequently, intravenous infusion of Lexiscan was performed under the supervision of the Cardiology staff. At peak effect of the drug, 30 mCi Tc-68m tetrofosmin was injected intravenously and standard myocardial SPECT imaging was performed. Quantitative gated imaging  was also performed to evaluate left ventricular wall motion, and estimate left ventricular ejection fraction. COMPARISON:  None. FINDINGS: Perfusion: There is mild reversibility along the apical segment of the inferior wall. No large infarct. No other areas are suspicious for reversibility. Wall Motion: Normal left ventricular wall motion. No left ventricular dilation. Left Ventricular Ejection Fraction: 65 % End diastolic volume 82 ml End systolic volume 29 ml IMPRESSION: 1. Mild reversibility involving the apical segment of the inferior wall. Findings concerning for a small area of ischemia. 2. Normal left ventricular wall motion. 3. Left ventricular ejection fraction is 65%. 4. Non invasive risk stratification*: Intermediate *2012 Appropriate Use Criteria for Coronary Revascularization Focused Update: J Am Coll Cardiol. 2012;59(9):857-881.  http://content.dementiazones.com.aspx?[email or web address] Electronically Signed   By: Richarda Overlie M.D.   On: 03/21/2016 15:02   Dg Swallowing Func-speech Pathology  Result Date: 03/21/2016 Objective Swallowing Evaluation: Type of Study: MBS-Modified Barium Swallow Study Patient Details Name: STONEY KARCZEWSKI MRN: [id number] Date of Birth: 1940-08-23 Today's Date: 03/21/2016 Time: SLP Start Time (ACUTE ONLY): 1419-SLP Stop Time (ACUTE ONLY): 1431 SLP Time Calculation (min) (ACUTE ONLY): 12 min Past Medical History: Past Medical History: Diagnosis Date . Alcoholism (HCC)  . Anemia  . Anxiety  . Bilateral lower extremity edema 09/03/2012  Possibly secondary to calcium channel blocker. . Chronic back pain  . Chronic respiratory failure with hypoxia (HCC)  . COPD (chronic obstructive pulmonary disease) (HCC)  . Depression  . DJD (degenerative joint disease)  . Esophageal dysmotility 07/04/2011 . Essential hypertension, benign  . Myocardial infarction (HCC)  . Paroxysmal atrial fibrillation (HCC) 08/29/2012 . Partial small bowel obstruction (HCC) 09/02/2012 . Peripheral neuropathy (HCC)  . Pneumonia  . PTSD (post-traumatic stress disorder)  . Type II or unspecified type diabetes mellitus without mention of complication, not stated as uncontrolled  Past Surgical History: Past Surgical History: Procedure Laterality Date . APPENDECTOMY   . HERNIA REPAIR    X 3 . PROSTATE SURGERY   . Spinal surgeries    X 4 HPI: 75 y.o.Caucasian male, [employer] [place] veteran with medical history significant for PTSD, alcoholism, depression, anxiety, COPD with chronic hypoxic respiratory failure, and hypertension who was admitted 03/02/16-8/4/17after being found down at his home by a friend. During this admission he was recommended to have Dys 1 diet and honey thick liquids. He says that he frequently gets PNA - often multiple times per year. Subjective: pt says he has trouble with his esophagus Assessment / Plan / Recommendation CHL IP  CLINICAL IMPRESSIONS 03/21/2016 Therapy Diagnosis WFL;Suspected primary esophageal dysphagia Clinical Impression Pt's oropharyngeal swallow is within gross functional limits given age and lack of dentition. Frequent coughing noted during study was not associated with any airway compromise. He appears to be more at risk for post-prandial aspiration given what appeared to be reduced clearance of the esophagus, although MD was not present to confirm. Recommend Dys 2 diet given lack of dentition, consistency with home diet, and esophageal concerns. He may have thin liquids as tolerated. SLP to f/u for tolerance and reinforcement of aspiration and esophageal precautions. Impact on safety and function Moderate aspiration risk   CHL IP TREATMENT RECOMMENDATION 03/21/2016 Treatment Recommendations Therapy as outlined in treatment plan below   Prognosis 03/21/2016 Prognosis for Safe Diet Advancement Fair Barriers to Reach Goals Other (Comment) Barriers/Prognosis Comment -- CHL IP DIET RECOMMENDATION 03/21/2016 SLP Diet Recommendations Dysphagia 2 (Fine chop) solids;Thin liquid Liquid Administration via Cup;Straw Medication Administration Whole meds with puree Compensations Minimize environmental distractions;Slow rate;Small  sips/bites;Follow solids with liquid Postural Changes Remain semi-upright after after feeds/meals (Comment);Seated upright at 90 degrees   CHL IP OTHER RECOMMENDATIONS 03/21/2016 Recommended Consults Consider esophageal assessment Oral Care Recommendations Oral care BID Other Recommendations --   CHL IP FOLLOW UP RECOMMENDATIONS 03/21/2016 Follow up Recommendations (No Data)   CHL IP FREQUENCY AND DURATION 03/21/2016 Speech Therapy Frequency (ACUTE ONLY) min 2x/week Treatment Duration 2 weeks      CHL IP ORAL PHASE 03/21/2016 Oral Phase WFL Oral - Pudding Teaspoon -- Oral - Pudding Cup -- Oral - Honey Teaspoon -- Oral - Honey Cup -- Oral - Nectar Teaspoon -- Oral - Nectar Cup -- Oral - Nectar Straw -- Oral - Thin  Teaspoon -- Oral - Thin Cup -- Oral - Thin Straw -- Oral - Puree -- Oral - Mech Soft -- Oral - Regular -- Oral - Multi-Consistency -- Oral - Pill -- Oral Phase - Comment --  CHL IP PHARYNGEAL PHASE 03/21/2016 Pharyngeal Phase WFL Pharyngeal- Pudding Teaspoon -- Pharyngeal -- Pharyngeal- Pudding Cup -- Pharyngeal -- Pharyngeal- Honey Teaspoon -- Pharyngeal -- Pharyngeal- Honey Cup -- Pharyngeal -- Pharyngeal- Nectar Teaspoon -- Pharyngeal -- Pharyngeal- Nectar Cup -- Pharyngeal -- Pharyngeal- Nectar Straw -- Pharyngeal -- Pharyngeal- Thin Teaspoon -- Pharyngeal -- Pharyngeal- Thin Cup -- Pharyngeal -- Pharyngeal- Thin Straw -- Pharyngeal -- Pharyngeal- Puree -- Pharyngeal -- Pharyngeal- Mechanical Soft -- Pharyngeal -- Pharyngeal- Regular -- Pharyngeal -- Pharyngeal- Multi-consistency -- Pharyngeal -- Pharyngeal- Pill -- Pharyngeal -- Pharyngeal Comment --  CHL IP CERVICAL ESOPHAGEAL PHASE 03/21/2016 Cervical Esophageal Phase WFL Pudding Teaspoon -- Pudding Cup -- Honey Teaspoon -- Honey Cup -- Nectar Teaspoon -- Nectar Cup -- Nectar Straw -- Thin Teaspoon -- Thin Cup -- Thin Straw -- Puree -- Mechanical Soft -- Regular -- Multi-consistency -- Pill -- Cervical Esophageal Comment -- No flowsheet data found. Maxcine Ham 03/21/2016, 2:53 PM  Maxcine Ham, M.A. CCC-SLP [phone]

## 2016-10-23 ENCOUNTER — Ambulatory Visit (INDEPENDENT_AMBULATORY_CARE_PROVIDER_SITE_OTHER): Payer: Medicare Other | Admitting: Cardiology

## 2016-10-23 ENCOUNTER — Encounter: Payer: Self-pay | Admitting: Cardiology

## 2016-10-23 VITALS — BP 101/56 | HR 73 | Ht 64.5 in | Wt 114.0 lb

## 2016-10-23 DIAGNOSIS — I10 Essential (primary) hypertension: Secondary | ICD-10-CM | POA: Diagnosis not present

## 2016-10-23 DIAGNOSIS — I4891 Unspecified atrial fibrillation: Secondary | ICD-10-CM

## 2016-10-23 DIAGNOSIS — R0789 Other chest pain: Secondary | ICD-10-CM

## 2016-10-23 NOTE — Progress Notes (Signed)
Clinical Summary [name] is a 76 y.o.male seen today for follow up of the following medical problem.s   1. Paroxysmal afib - not on anticoag due to poor compliance  - no recent palpitations.    2. Chest pain - nuclear stresss test 03/2016:possible mild ischemia inferior wall, LVEF 65%. Intermediate risk - thought to be poor cath candidate at time of admission, managed medically.  - no recent symptoms.   2. HTN - he is compliant with meds  3. COPD - on home oxygen - followed by Dr Sherene Sires  5. Dementia - seems to have severely progressed since last visit. Significnat weight loss, failure to thrive. Living at nursing home.     Past Medical History:  Diagnosis Date  . Alcoholism (HCC)   . Anemia   . Anxiety   . Bilateral lower extremity edema 09/03/2012   Possibly secondary to calcium channel blocker.  . Chronic back pain   . Chronic respiratory failure with hypoxia (HCC)   . COPD (chronic obstructive pulmonary disease) (HCC)   . Depression   . DJD (degenerative joint disease)   . Esophageal dysmotility 07/04/2011  . Essential hypertension, benign   . Myocardial infarction   . Paroxysmal atrial fibrillation (HCC) 08/29/2012  . Partial small bowel obstruction 09/02/2012  . Peripheral neuropathy (HCC)   . Pneumonia   . PTSD (post-traumatic stress disorder)   . Type II or unspecified type diabetes mellitus without mention of complication, not stated as uncontrolled      Allergies  Allergen Reactions  . Aspirin Other (See Comments)    Stomach upset     Current Outpatient Prescriptions  Medication Sig Dispense Refill  . acetaminophen (TYLENOL) 325 MG tablet Take 650 mg by mouth every 4 (four) hours as needed (pain).    [name] albuterol (PROVENTIL HFA;VENTOLIN HFA) 108 (90 BASE) MCG/ACT inhaler Inhale 2 puffs into the lungs every 6 (six) hours as needed for wheezing.    [name] atorvastatin (LIPITOR) 40 MG tablet Take 40 mg by mouth every evening.     .  budesonide-formoterol (SYMBICORT) 160-4.5 MCG/ACT inhaler Inhale 2 puffs into the lungs every evening.     . cephALEXin (KEFLEX) 500 MG capsule Take 500 mg by mouth 2 (two) times daily.    [name] ipratropium-albuterol (DUONEB) 0.5-2.5 (3) MG/3ML SOLN Take 3 mLs by nebulization 4 (four) times daily.    . iron polysaccharides (NIFEREX) 150 MG capsule Take 150 mg by mouth daily.    [name] ketoconazole (NIZORAL) 2 % shampoo Apply 1 application topically 2 (two) times a week. Tuesday and Friday - for dry scalp    . magnesium hydroxide (MILK OF MAGNESIA) 400 MG/5ML suspension Take 30 mLs by mouth daily as needed for mild constipation.    . metoprolol (LOPRESSOR) 50 MG tablet Take 50 mg by mouth 2 (two) times daily.    . mirtazapine (REMERON) 15 MG tablet Take 15 mg by mouth at bedtime.    . Multiple Vitamin (MULTIVITAMIN WITH MINERALS) TABS tablet Take 1 tablet by mouth daily.    . Nutritional Supplements (PROMOD) LIQD Take 60 mLs by mouth every evening.    [name] NUTRITIONAL SUPPLEMENTS PO Take 60 mLs by mouth 3 (three) times daily. [employer]    . omeprazole (PRILOSEC) 20 MG capsule Take 20 mg by mouth every evening.    . OXYGEN Inhale 2 L into the lungs continuous.    [name] PARoxetine (PAXIL) 20 MG tablet Take 20 mg by mouth daily.     [name]  Skin Protectants, Misc. (SOOTHE & COOL FREE MEDSEPTIC) OINT Apply 1 application topically See admin instructions. Apply to buttocks with each toileting episode    . Tamsulosin HCl (FLOMAX) 0.4 MG CAPS Take 1 capsule (0.4 mg total) by mouth daily after supper. 30 capsule 0  . tiotropium (SPIRIVA) 18 MCG inhalation capsule Place 18 mcg into inhaler and inhale at bedtime.     . vitamin C (ASCORBIC ACID) 500 MG tablet Take 500 mg by mouth 2 (two) times daily.     No current facility-administered medications for this visit.      Past Surgical History:  Procedure Laterality Date  . APPENDECTOMY    . HERNIA REPAIR     X 3  . PROSTATE SURGERY    . Spinal surgeries     X 4      Allergies  Allergen Reactions  . Aspirin Other (See Comments)    Stomach upset      Family History  Problem Relation Age of Onset  . Lung disease Mother   . Prostate cancer Father      Social History [name] reports that he has been smoking Cigarettes.  He has a 27.50 pack-year smoking history. He has never used smokeless tobacco. [name] reports that he does not drink alcohol.   Review of Systems CONSTITUTIONAL: No weight loss, fever, chills, weakness or fatigue.  HEENT: Eyes: No visual loss, blurred vision, double vision or yellow sclerae.No hearing loss, sneezing, congestion, runny nose or sore throat.  SKIN: No rash or itching.  CARDIOVASCULAR: pre hpi RESPIRATORY: No shortness of breath, cough or sputum.  GASTROINTESTINAL: No anorexia, nausea, vomiting or diarrhea. No abdominal pain or blood.  GENITOURINARY: No burning on urination, no polyuria NEUROLOGICAL: No headache, dizziness, syncope, paralysis, ataxia, numbness or tingling in the extremities. No change in bowel or bladder control.  MUSCULOSKELETAL: No muscle, back pain, joint pain or stiffness.  LYMPHATICS: No enlarged nodes. No history of splenectomy.  PSYCHIATRIC: No history of depression or anxiety.  ENDOCRINOLOGIC: No reports of sweating, cold or heat intolerance. No polyuria or polydipsia.  [name].   Physical Examination Vitals:   10/23/16 1114  BP: (!) 101/56  Pulse: 73   Vitals:   10/23/16 1114  Weight: 114 lb (51.7 kg)  Height: 5' 4.5" (1.638 m)    Gen: resting comfortably, no acute distress HEENT: no scleral icterus, pupils equal round and reactive, no palptable cervical adenopathy,  CV: RRR, no m/r/g ,no jvd Resp: Clear to auscultation bilaterally GI: abdomen is soft, non-tender, non-distended, normal bowel sounds, no hepatosplenomegaly MSK: extremities are warm, no edema.  Skin: warm, no rash Neuro:  no focal deficits Psych: appropriate affect      Assessment and Plan  1.  Afib - history of parox afib per notes, not on anticoag because of long history of non-compliance  - no recent symptoms, continue current meds  2. Chest pain -no recent symptoms, continue to monitor.   3. HTN - at goal, continue current meds  4. Dementia - rapid progression, overall failure to thrive with significant weight loss, not eating. Living at nursing home - management per pcp.   F/u as needed  Antoine PocheJonathan F. Mailani Degroote, M.D.

## 2016-10-23 NOTE — Patient Instructions (Signed)
Your physician recommends that you schedule a follow-up appointment in: AS NEEDED WITH DR BRANCH  Your physician recommends that you continue on your current medications as directed. Please refer to the Current Medication list given to you today.  Thank you for choosing [facility]!!    

## 2016-12-02 DEATH — deceased
